# Patient Record
Sex: Male | Born: 1955
Health system: Southern US, Community
[De-identification: ages and names within clinical notes are randomized; demographics above are authoritative.]

## PROBLEM LIST (undated history)

## (undated) DIAGNOSIS — I7090 Unspecified atherosclerosis: Secondary | ICD-10-CM

## (undated) DIAGNOSIS — C189 Malignant neoplasm of colon, unspecified: Secondary | ICD-10-CM

## (undated) DIAGNOSIS — D509 Iron deficiency anemia, unspecified: Secondary | ICD-10-CM

## (undated) DIAGNOSIS — I2510 Atherosclerotic heart disease of native coronary artery without angina pectoris: Secondary | ICD-10-CM

## (undated) DIAGNOSIS — I739 Peripheral vascular disease, unspecified: Secondary | ICD-10-CM

## (undated) DIAGNOSIS — Z7902 Long term (current) use of antithrombotics/antiplatelets: Secondary | ICD-10-CM

## (undated) DIAGNOSIS — I255 Ischemic cardiomyopathy: Secondary | ICD-10-CM

## (undated) DIAGNOSIS — I517 Cardiomegaly: Secondary | ICD-10-CM

## (undated) DIAGNOSIS — C801 Malignant (primary) neoplasm, unspecified: Secondary | ICD-10-CM

## (undated) DIAGNOSIS — N135 Crossing vessel and stricture of ureter without hydronephrosis: Secondary | ICD-10-CM

## (undated) DIAGNOSIS — R918 Other nonspecific abnormal finding of lung field: Secondary | ICD-10-CM

## (undated) DIAGNOSIS — I700 Atherosclerosis of aorta: Secondary | ICD-10-CM

## (undated) DIAGNOSIS — R319 Hematuria, unspecified: Secondary | ICD-10-CM

## (undated) DIAGNOSIS — I5020 Unspecified systolic (congestive) heart failure: Secondary | ICD-10-CM

## (undated) DIAGNOSIS — E119 Type 2 diabetes mellitus without complications: Secondary | ICD-10-CM

## (undated) DIAGNOSIS — N200 Calculus of kidney: Secondary | ICD-10-CM

## (undated) DIAGNOSIS — R3129 Other microscopic hematuria: Secondary | ICD-10-CM

## (undated) DIAGNOSIS — I219 Acute myocardial infarction, unspecified: Secondary | ICD-10-CM

## (undated) DIAGNOSIS — I509 Heart failure, unspecified: Secondary | ICD-10-CM

## (undated) DIAGNOSIS — K435 Parastomal hernia without obstruction or  gangrene: Secondary | ICD-10-CM

## (undated) DIAGNOSIS — R0600 Dyspnea, unspecified: Secondary | ICD-10-CM

## (undated) DIAGNOSIS — N189 Chronic kidney disease, unspecified: Secondary | ICD-10-CM

## (undated) DIAGNOSIS — Z7982 Long term (current) use of aspirin: Secondary | ICD-10-CM

## (undated) DIAGNOSIS — E785 Hyperlipidemia, unspecified: Secondary | ICD-10-CM

## (undated) DIAGNOSIS — M1990 Unspecified osteoarthritis, unspecified site: Secondary | ICD-10-CM

## (undated) DIAGNOSIS — Z972 Presence of dental prosthetic device (complete) (partial): Secondary | ICD-10-CM

## (undated) DIAGNOSIS — N183 Chronic kidney disease, stage 3 unspecified: Secondary | ICD-10-CM

## (undated) DIAGNOSIS — K219 Gastro-esophageal reflux disease without esophagitis: Secondary | ICD-10-CM

## (undated) DIAGNOSIS — M109 Gout, unspecified: Secondary | ICD-10-CM

## (undated) DIAGNOSIS — C20 Malignant neoplasm of rectum: Secondary | ICD-10-CM

## (undated) DIAGNOSIS — I10 Essential (primary) hypertension: Secondary | ICD-10-CM

## (undated) HISTORY — DX: Iron deficiency anemia, unspecified: D50.9

## (undated) HISTORY — DX: Acute myocardial infarction, unspecified: I21.9

## (undated) HISTORY — DX: Malignant (primary) neoplasm, unspecified: C80.1

## (undated) HISTORY — PX: OTHER SURGICAL HISTORY: SHX169

## (undated) HISTORY — DX: Other nonspecific abnormal finding of lung field: R91.8

## (undated) HISTORY — DX: Hyperlipidemia, unspecified: E78.5

## (undated) HISTORY — PX: COLON SURGERY: SHX602

## (undated) HISTORY — DX: Malignant neoplasm of rectum: C20

## (undated) HISTORY — DX: Crossing vessel and stricture of ureter without hydronephrosis: N13.5

## (undated) HISTORY — DX: Hematuria, unspecified: R31.9

## (undated) HISTORY — DX: Heart failure, unspecified: I50.9

## (undated) HISTORY — DX: Essential (primary) hypertension: I10

## (undated) HISTORY — PX: COLONOSCOPY WITH ESOPHAGOGASTRODUODENOSCOPY (EGD): SHX5779

## (undated) HISTORY — DX: Gout, unspecified: M10.9

## (undated) HISTORY — DX: Malignant neoplasm of colon, unspecified: C18.9

## (undated) SURGERY — Surgical Case
Anesthesia: *Unknown

---

## 2003-04-20 DIAGNOSIS — I219 Acute myocardial infarction, unspecified: Secondary | ICD-10-CM

## 2003-04-20 HISTORY — DX: Acute myocardial infarction, unspecified: I21.9

## 2004-03-19 HISTORY — PX: CORONARY ANGIOPLASTY WITH STENT PLACEMENT: SHX49

## 2004-04-14 ENCOUNTER — Other Ambulatory Visit: Payer: Self-pay

## 2004-04-14 ENCOUNTER — Inpatient Hospital Stay: Payer: Self-pay

## 2004-04-14 DIAGNOSIS — I214 Non-ST elevation (NSTEMI) myocardial infarction: Secondary | ICD-10-CM

## 2004-04-14 HISTORY — DX: Non-ST elevation (NSTEMI) myocardial infarction: I21.4

## 2004-04-15 ENCOUNTER — Other Ambulatory Visit: Payer: Self-pay

## 2004-04-16 ENCOUNTER — Other Ambulatory Visit: Payer: Self-pay

## 2004-04-16 HISTORY — PX: CORONARY ANGIOPLASTY WITH STENT PLACEMENT: SHX49

## 2004-05-18 ENCOUNTER — Ambulatory Visit: Payer: Self-pay | Admitting: *Deleted

## 2004-05-26 ENCOUNTER — Encounter: Payer: Self-pay | Admitting: Internal Medicine

## 2004-11-04 ENCOUNTER — Emergency Department: Payer: Self-pay | Admitting: Emergency Medicine

## 2008-06-20 ENCOUNTER — Ambulatory Visit: Payer: Self-pay | Admitting: Urology

## 2008-08-30 ENCOUNTER — Emergency Department: Payer: Self-pay | Admitting: Emergency Medicine

## 2008-09-18 ENCOUNTER — Ambulatory Visit: Payer: Self-pay | Admitting: Urology

## 2008-09-25 ENCOUNTER — Emergency Department: Payer: Self-pay | Admitting: Emergency Medicine

## 2013-09-28 DIAGNOSIS — M109 Gout, unspecified: Secondary | ICD-10-CM | POA: Insufficient documentation

## 2013-09-28 DIAGNOSIS — I10 Essential (primary) hypertension: Secondary | ICD-10-CM | POA: Insufficient documentation

## 2013-09-28 DIAGNOSIS — I2510 Atherosclerotic heart disease of native coronary artery without angina pectoris: Secondary | ICD-10-CM | POA: Insufficient documentation

## 2013-09-28 DIAGNOSIS — E785 Hyperlipidemia, unspecified: Secondary | ICD-10-CM | POA: Insufficient documentation

## 2013-09-28 DIAGNOSIS — E119 Type 2 diabetes mellitus without complications: Secondary | ICD-10-CM | POA: Insufficient documentation

## 2013-10-01 DIAGNOSIS — R0602 Shortness of breath: Secondary | ICD-10-CM | POA: Insufficient documentation

## 2014-01-23 ENCOUNTER — Ambulatory Visit: Payer: Self-pay | Admitting: Internal Medicine

## 2014-01-23 LAB — CBC CANCER CENTER
BASOS ABS: 0 x10 3/mm (ref 0.0–0.1)
Basophil %: 0.8 %
Eosinophil #: 0.2 x10 3/mm (ref 0.0–0.7)
Eosinophil %: 3.3 %
HCT: 28.3 % — AB (ref 40.0–52.0)
HGB: 8.4 g/dL — ABNORMAL LOW (ref 13.0–18.0)
Lymphocyte #: 1.3 x10 3/mm (ref 1.0–3.6)
Lymphocyte %: 19.7 %
MCH: 19.5 pg — ABNORMAL LOW (ref 26.0–34.0)
MCHC: 29.5 g/dL — ABNORMAL LOW (ref 32.0–36.0)
MCV: 66 fL — ABNORMAL LOW (ref 80–100)
MONO ABS: 0.5 x10 3/mm (ref 0.2–1.0)
Monocyte %: 7.9 %
Neutrophil #: 4.4 x10 3/mm (ref 1.4–6.5)
Neutrophil %: 68.3 %
PLATELETS: 339 x10 3/mm (ref 150–440)
RBC: 4.28 10*6/uL — AB (ref 4.40–5.90)
RDW: 17.7 % — AB (ref 11.5–14.5)
WBC: 6.4 x10 3/mm (ref 3.8–10.6)

## 2014-01-23 LAB — CREATININE, SERUM
CREATININE: 1.88 mg/dL — AB (ref 0.60–1.30)
EGFR (African American): 47 — ABNORMAL LOW
EGFR (Non-African Amer.): 39 — ABNORMAL LOW

## 2014-01-24 LAB — URIC ACID: URIC ACID: 7.9 mg/dL — AB (ref 3.5–7.2)

## 2014-01-25 LAB — KAPPA/LAMBDA FREE LIGHT CHAINS (ARMC)

## 2014-01-25 LAB — PROT IMMUNOELECTROPHORES(ARMC)

## 2014-02-01 LAB — CREATININE, SERUM
Creatinine: 1.97 mg/dL — ABNORMAL HIGH (ref 0.60–1.30)
EGFR (African American): 45 — ABNORMAL LOW
EGFR (Non-African Amer.): 37 — ABNORMAL LOW

## 2014-02-01 LAB — URINALYSIS, COMPLETE
BACTERIA: NONE SEEN
Bilirubin,UR: NEGATIVE
Blood: NEGATIVE
Glucose,UR: NEGATIVE mg/dL (ref 0–75)
Hyaline Cast: 14
Ketone: NEGATIVE
Nitrite: NEGATIVE
Ph: 5 (ref 4.5–8.0)
Protein: NEGATIVE
SPECIFIC GRAVITY: 1.013 (ref 1.003–1.030)
WBC UR: 1 /HPF (ref 0–5)

## 2014-02-01 LAB — CANCER CENTER HEMOGLOBIN: HGB: 8.6 g/dL — ABNORMAL LOW (ref 13.0–18.0)

## 2014-02-17 ENCOUNTER — Ambulatory Visit: Payer: Self-pay | Admitting: Internal Medicine

## 2014-02-22 LAB — CANCER CENTER HEMOGLOBIN: HGB: 9.5 g/dL — ABNORMAL LOW (ref 13.0–18.0)

## 2014-03-01 LAB — CANCER CENTER HEMOGLOBIN: HGB: 9.4 g/dL — ABNORMAL LOW (ref 13.0–18.0)

## 2014-03-03 LAB — KAPPA/LAMBDA FREE LIGHT CHAINS (ARMC)

## 2014-03-06 LAB — IRON AND TIBC
IRON BIND. CAP.(TOTAL): 304 ug/dL (ref 250–450)
IRON SATURATION: 45 %
IRON: 136 ug/dL (ref 65–175)
Unbound Iron-Bind.Cap.: 168 ug/dL

## 2014-03-06 LAB — FERRITIN: Ferritin (ARMC): 12 ng/mL (ref 8–388)

## 2014-03-06 LAB — CANCER CENTER HEMOGLOBIN: HGB: 9.8 g/dL — AB (ref 13.0–18.0)

## 2014-03-19 ENCOUNTER — Ambulatory Visit: Payer: Self-pay | Admitting: Internal Medicine

## 2014-04-05 LAB — CANCER CENTER HEMOGLOBIN: HGB: 9.7 g/dL — ABNORMAL LOW (ref 13.0–18.0)

## 2014-04-19 ENCOUNTER — Ambulatory Visit: Payer: Self-pay | Admitting: Internal Medicine

## 2014-05-01 LAB — CANCER CENTER HEMOGLOBIN: HGB: 11.1 g/dL — ABNORMAL LOW (ref 13.0–18.0)

## 2014-05-01 LAB — FERRITIN: FERRITIN (ARMC): 13 ng/mL (ref 8–388)

## 2014-05-08 LAB — CANCER CENTER HEMOGLOBIN: HGB: 11.1 g/dL — AB (ref 13.0–18.0)

## 2014-05-08 LAB — FERRITIN: Ferritin (ARMC): 11 ng/mL (ref 8–388)

## 2014-05-16 ENCOUNTER — Ambulatory Visit: Payer: Self-pay | Admitting: Internal Medicine

## 2014-05-20 ENCOUNTER — Ambulatory Visit: Payer: Self-pay | Admitting: Internal Medicine

## 2014-06-06 ENCOUNTER — Ambulatory Visit: Payer: Self-pay | Admitting: Gastroenterology

## 2014-06-17 HISTORY — PX: COLONOSCOPY: SHX174

## 2014-06-18 ENCOUNTER — Ambulatory Visit
Admit: 2014-06-18 | Disposition: A | Discharge status: Home / Self Care | Payer: Self-pay | Attending: Internal Medicine | Admitting: Internal Medicine

## 2014-07-01 ENCOUNTER — Ambulatory Visit: Payer: Self-pay | Admitting: Surgery

## 2014-07-01 HISTORY — PX: PORTACATH PLACEMENT: SHX2246

## 2014-07-15 LAB — CBC CANCER CENTER
Basophil #: 0 x10 3/mm (ref 0.0–0.1)
Basophil %: 0.4 %
EOS PCT: 2.6 %
Eosinophil #: 0.1 x10 3/mm (ref 0.0–0.7)
HCT: 37.7 % — ABNORMAL LOW (ref 40.0–52.0)
HGB: 11.9 g/dL — ABNORMAL LOW (ref 13.0–18.0)
LYMPHS ABS: 0.5 x10 3/mm — AB (ref 1.0–3.6)
Lymphocyte %: 10.1 %
MCH: 22.3 pg — AB (ref 26.0–34.0)
MCHC: 31.4 g/dL — ABNORMAL LOW (ref 32.0–36.0)
MCV: 71 fL — AB (ref 80–100)
MONO ABS: 0.3 x10 3/mm (ref 0.2–1.0)
Monocyte %: 6.4 %
Neutrophil #: 3.7 x10 3/mm (ref 1.4–6.5)
Neutrophil %: 80.5 %
Platelet: 179 x10 3/mm (ref 150–440)
RBC: 5.31 10*6/uL (ref 4.40–5.90)
RDW: 17.2 % — AB (ref 11.5–14.5)
WBC: 4.5 x10 3/mm (ref 3.8–10.6)

## 2014-07-15 LAB — COMPREHENSIVE METABOLIC PANEL
ANION GAP: 5 — AB (ref 7–16)
Albumin: 4.1 g/dL
Alkaline Phosphatase: 75 U/L
BUN: 39 mg/dL — ABNORMAL HIGH
Bilirubin,Total: 0.6 mg/dL
CALCIUM: 9 mg/dL
Chloride: 100 mmol/L — ABNORMAL LOW
Co2: 27 mmol/L
Creatinine: 1.37 mg/dL — ABNORMAL HIGH
EGFR (Non-African Amer.): 56 — ABNORMAL LOW
Glucose: 97 mg/dL
Potassium: 3.8 mmol/L
SGOT(AST): 14 U/L — ABNORMAL LOW
SGPT (ALT): 12 U/L — ABNORMAL LOW
Sodium: 132 mmol/L — ABNORMAL LOW
TOTAL PROTEIN: 6.7 g/dL

## 2014-07-17 LAB — CBC CANCER CENTER
Basophil #: 0 x10 3/mm (ref 0.0–0.1)
Basophil %: 0.2 %
Eosinophil #: 0.1 x10 3/mm (ref 0.0–0.7)
Eosinophil %: 1.6 %
HCT: 38.1 % — ABNORMAL LOW (ref 40.0–52.0)
HGB: 12 g/dL — ABNORMAL LOW (ref 13.0–18.0)
LYMPHS ABS: 0.4 x10 3/mm — AB (ref 1.0–3.6)
Lymphocyte %: 5.3 %
MCH: 22.4 pg — AB (ref 26.0–34.0)
MCHC: 31.4 g/dL — ABNORMAL LOW (ref 32.0–36.0)
MCV: 71 fL — ABNORMAL LOW (ref 80–100)
MONO ABS: 0.4 x10 3/mm (ref 0.2–1.0)
MONOS PCT: 6.2 %
NEUTROS ABS: 6 x10 3/mm (ref 1.4–6.5)
Neutrophil %: 86.7 %
PLATELETS: 192 x10 3/mm (ref 150–440)
RBC: 5.34 10*6/uL (ref 4.40–5.90)
RDW: 17.7 % — AB (ref 11.5–14.5)
WBC: 6.9 x10 3/mm (ref 3.8–10.6)

## 2014-07-19 ENCOUNTER — Ambulatory Visit
Admit: 2014-07-19 | Disposition: A | Discharge status: Home / Self Care | Payer: Self-pay | Attending: Internal Medicine | Admitting: Internal Medicine

## 2014-07-22 LAB — HEPATIC FUNCTION PANEL A (ARMC)
Albumin: 4 g/dL
Alkaline Phosphatase: 73 U/L
BILIRUBIN TOTAL: 0.8 mg/dL
SGOT(AST): 13 U/L — ABNORMAL LOW
SGPT (ALT): 13 U/L — ABNORMAL LOW
TOTAL PROTEIN: 7 g/dL

## 2014-07-22 LAB — CBC CANCER CENTER
Basophil #: 0 x10 3/mm (ref 0.0–0.1)
Basophil %: 0.3 %
EOS PCT: 2 %
Eosinophil #: 0.1 x10 3/mm (ref 0.0–0.7)
HCT: 36 % — ABNORMAL LOW (ref 40.0–52.0)
HGB: 11.2 g/dL — ABNORMAL LOW (ref 13.0–18.0)
Lymphocyte #: 0.2 x10 3/mm — ABNORMAL LOW (ref 1.0–3.6)
Lymphocyte %: 2.6 %
MCH: 22 pg — ABNORMAL LOW (ref 26.0–34.0)
MCHC: 31 g/dL — AB (ref 32.0–36.0)
MCV: 71 fL — ABNORMAL LOW (ref 80–100)
MONOS PCT: 5.5 %
Monocyte #: 0.4 x10 3/mm (ref 0.2–1.0)
NEUTROS ABS: 5.8 x10 3/mm (ref 1.4–6.5)
NEUTROS PCT: 89.6 %
PLATELETS: 189 x10 3/mm (ref 150–440)
RBC: 5.08 10*6/uL (ref 4.40–5.90)
RDW: 17.5 % — ABNORMAL HIGH (ref 11.5–14.5)
WBC: 6.5 x10 3/mm (ref 3.8–10.6)

## 2014-07-22 LAB — CREATININE, SERUM
CREATININE: 1.45 mg/dL — AB
Creatine, Serum: 1.45
GFR CALC NON AF AMER: 52 — AB

## 2014-07-29 LAB — HEPATIC FUNCTION PANEL A (ARMC)
ALK PHOS: 76 U/L
Albumin: 3.7 g/dL
BILIRUBIN TOTAL: 0.5 mg/dL
SGOT(AST): 17 U/L
SGPT (ALT): 22 U/L
Total Protein: 7.3 g/dL

## 2014-07-29 LAB — CBC CANCER CENTER
BASOS ABS: 0 x10 3/mm (ref 0.0–0.1)
Basophil %: 0.4 %
EOS ABS: 0.3 x10 3/mm (ref 0.0–0.7)
Eosinophil %: 3.8 %
HCT: 35.8 % — ABNORMAL LOW (ref 40.0–52.0)
HGB: 11.1 g/dL — ABNORMAL LOW (ref 13.0–18.0)
LYMPHS PCT: 4 %
Lymphocyte #: 0.3 x10 3/mm — ABNORMAL LOW (ref 1.0–3.6)
MCH: 22.2 pg — ABNORMAL LOW (ref 26.0–34.0)
MCHC: 30.9 g/dL — AB (ref 32.0–36.0)
MCV: 72 fL — ABNORMAL LOW (ref 80–100)
Monocyte #: 0.6 x10 3/mm (ref 0.2–1.0)
Monocyte %: 7.2 %
NEUTROS PCT: 84.6 %
Neutrophil #: 6.9 x10 3/mm — ABNORMAL HIGH (ref 1.4–6.5)
PLATELETS: 346 x10 3/mm (ref 150–440)
RBC: 5 10*6/uL (ref 4.40–5.90)
RDW: 18.3 % — AB (ref 11.5–14.5)
WBC: 8.2 x10 3/mm (ref 3.8–10.6)

## 2014-07-29 LAB — CREATININE, SERUM
Creatinine: 1.7 mg/dL — ABNORMAL HIGH
GFR CALC AF AMER: 50 — AB
GFR CALC NON AF AMER: 43 — AB

## 2014-07-31 LAB — CREATININE, SERUM
CREATININE: 1.72 mg/dL — AB
GFR CALC AF AMER: 49 — AB
GFR CALC NON AF AMER: 42 — AB

## 2014-08-05 LAB — CBC CANCER CENTER
BASOS ABS: 0 x10 3/mm (ref 0.0–0.1)
Basophil %: 0.4 %
Eosinophil #: 0.2 x10 3/mm (ref 0.0–0.7)
Eosinophil %: 2 %
HCT: 36.6 % — ABNORMAL LOW (ref 40.0–52.0)
HGB: 11.5 g/dL — ABNORMAL LOW (ref 13.0–18.0)
LYMPHS PCT: 2.7 %
Lymphocyte #: 0.3 x10 3/mm — ABNORMAL LOW (ref 1.0–3.6)
MCH: 22.5 pg — ABNORMAL LOW (ref 26.0–34.0)
MCHC: 31.4 g/dL — ABNORMAL LOW (ref 32.0–36.0)
MCV: 72 fL — ABNORMAL LOW (ref 80–100)
MONOS PCT: 4.8 %
Monocyte #: 0.5 x10 3/mm (ref 0.2–1.0)
Neutrophil #: 8.9 x10 3/mm — ABNORMAL HIGH (ref 1.4–6.5)
Neutrophil %: 90.1 %
Platelet: 458 x10 3/mm — ABNORMAL HIGH (ref 150–440)
RBC: 5.1 10*6/uL (ref 4.40–5.90)
RDW: 18.2 % — AB (ref 11.5–14.5)
WBC: 9.9 x10 3/mm (ref 3.8–10.6)

## 2014-08-05 LAB — HEPATIC FUNCTION PANEL A (ARMC)
Albumin: 3.9 g/dL
Alkaline Phosphatase: 75 U/L
Bilirubin,Total: 0.5 mg/dL
SGOT(AST): 16 U/L
SGPT (ALT): 16 U/L — ABNORMAL LOW
Total Protein: 7.1 g/dL

## 2014-08-05 LAB — CREATININE, SERUM
Creatinine: 1.64 mg/dL — ABNORMAL HIGH
EGFR (Non-African Amer.): 45 — ABNORMAL LOW
GFR CALC AF AMER: 52 — AB

## 2014-08-06 LAB — URIC ACID: URIC ACID: 9.2 mg/dL — AB

## 2014-08-12 LAB — CBC CANCER CENTER
BASOS PCT: 0.3 %
Basophil #: 0 x10 3/mm (ref 0.0–0.1)
EOS PCT: 3.5 %
Eosinophil #: 0.2 x10 3/mm (ref 0.0–0.7)
HCT: 37.1 % — AB (ref 40.0–52.0)
HGB: 11.7 g/dL — AB (ref 13.0–18.0)
LYMPHS ABS: 0.3 x10 3/mm — AB (ref 1.0–3.6)
Lymphocyte %: 4.2 %
MCH: 22.8 pg — ABNORMAL LOW (ref 26.0–34.0)
MCHC: 31.6 g/dL — AB (ref 32.0–36.0)
MCV: 72 fL — ABNORMAL LOW (ref 80–100)
MONO ABS: 0.7 x10 3/mm (ref 0.2–1.0)
Monocyte %: 10.2 %
Neutrophil #: 5.2 x10 3/mm (ref 1.4–6.5)
Neutrophil %: 81.8 %
Platelet: 282 x10 3/mm (ref 150–440)
RBC: 5.14 10*6/uL (ref 4.40–5.90)
RDW: 20.6 % — ABNORMAL HIGH (ref 11.5–14.5)
WBC: 6.4 x10 3/mm (ref 3.8–10.6)

## 2014-08-12 LAB — COMPREHENSIVE METABOLIC PANEL
ALT: 15 U/L — AB
Albumin: 3.7 g/dL
Alkaline Phosphatase: 69 U/L
Anion Gap: 8 (ref 7–16)
BUN: 30 mg/dL — ABNORMAL HIGH
Bilirubin,Total: 0.6 mg/dL
CALCIUM: 8.9 mg/dL
Chloride: 100 mmol/L — ABNORMAL LOW
Co2: 24 mmol/L
Creatinine: 1.52 mg/dL — ABNORMAL HIGH
GFR CALC AF AMER: 57 — AB
GFR CALC NON AF AMER: 49 — AB
Glucose: 118 mg/dL — ABNORMAL HIGH
POTASSIUM: 4 mmol/L
SGOT(AST): 16 U/L
Sodium: 132 mmol/L — ABNORMAL LOW
Total Protein: 7.1 g/dL

## 2014-08-12 LAB — SURGICAL PATHOLOGY

## 2014-08-18 NOTE — Consult Note (Signed)
Reason for Visit: This 59 year old Male patient presents to the clinic for initial evaluation of  rectal cancer .   Referred by Dr.Gitten.  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 CT scan showed small into determine and pulmonary nodules which will warrant follow-up. There was a large rectal mass suspicious for transmural extension into the mesorectum. Was also equivocal perirectal and sigmoid mesocolon nodes which no metastasis cannot be excluded. No liver metastasis or extrapelvic disease was identified. Patient underwent endoscopic ultrasound at [facility] showing a mass 9-14 cm from the anal verge 70% circumferential measuring 9 mm in maximal thickness with sonographic evidence suggesting breakthrough of the muscularis propria with invasion into the perirectal fat and lymph node was visualized not thought to be representing metastatic adenopathy. He has been evaluated by medical oncology and is seen today for consideration of neoadjuvant chemoradiation prior to surgical resection. He continues to have some bright red blood per rectum although has been constipated with only gas production over the past several days. Is having no rectal pain significantly on defecation.  Past Hx:    Gout:    HTN:    Myocardial Infarction - Dec 2005:    Ureteral stricture repair:    Cardiac Stents - Dec 2005:   Past, Family and Social History:  Past Medical History  positive   Cardiovascular coronary artery stents; hyperlipidemia; hypertension; myocardial infarction; MI in 2005   Genitourinary ureteral stricture repair   Past Medical History Comments gout   Family History noncontributory   Social History noncontributory   Additional Past Medical and Surgical History seen by himself today   Allergies:   No Known Allergies:   Home Meds:  Home Medications: Medication Instructions Status  clonidine 0.2 mg oral tablet 1 tab(s) orally 2 times a day  Active  Iron 65 1 tab(s) orally 2 times a day Active  hydrochlorothiazide-lisinopril 25 mg-20 mg oral tablet 1 tab(s) orally once a day Active   Review of Systems:  General negative   Performance Status (ECOG) 0   Skin negative   Breast negative   Ophthalmologic negative   ENMT negative   Respiratory and Thorax negative   Cardiovascular negative   Gastrointestinal see HPI   Genitourinary negative   Musculoskeletal negative   Neurological negative   Psychiatric negative   Hematology/Lymphatics negative   Endocrine negative   Allergic/Immunologic negative   Review of Systems   except for bright red blood per rectumdenies any weight loss, fatigue, weakness, fever, chills or night sweats. Patient denies any loss of vision, blurred vision. Patient denies any ringing  of the ears or hearing loss. No irregular heartbeat. Patient denies heart murmur or history of fainting. Patient denies any chest pain or pain radiating to her upper extremities. Patient denies any shortness of breath, difficulty breathing at night, cough or hemoptysis. Patient denies any swelling in the lower legs. Patient denies any nausea vomiting, vomiting of blood, or coffee ground material in the vomitus. Patient denies any stomach pain. Patient states has  had normal bowel movements no significant constipation or diarrhea. Patient denies any dysuria, hematuria or significant nocturia. Patient denies any problems  walking, swelling in the joints or loss of balance. Patient denies any skin changes, loss of hair or loss of weight. Patient denies any excessive worrying or anxiety or significant depression. Patient denies any problems with insomnia. Patient denies excessive thirst, polyuria, polydipsia. Patient denies any swollen glands, patient denies easy bruising or easy bleeding. Patient denies any recent infections, allergies or URI. Patient "s visual fields have not changed significantly in recent time.   Nursing Notes:  Nursing Vital Signs and Chemo Nursing Nursing Notes: *CC Vital Signs Flowsheet:   02-Mar-16 10:38  Temp Temperature 96.8  Pulse Pulse 57  Respirations Respirations 18  SBP SBP 111  DBP DBP 69  Pain Scale (0-10)  0  Current Weight (kg) (kg) 92.9  Height (cm) centimeters 177  BSA (m2) 2.1   Physical Exam:  General/Skin/HEENT:  General normal   Skin normal   Eyes normal   ENMT normal   Head and Neck normal   Additional PE well-developed male in NAD. Lungs are clear to A&P cardiac examination shows regular rate and rhythm. No cervical or supraclavicular adenopathy is appreciated. Abdomen is benign positive bowel sounds in all 4 quadrants. On rectal exam rectal sphincter tone is good. At the tip of my examination finger is some firmness consistent with known distal rectal adenocarcinoma.   Breasts/Resp/CV/GI/GU:  Respiratory and Thorax normal   Cardiovascular normal   Gastrointestinal normal   Genitourinary normal   MS/Neuro/Psych/Lymph:  Musculoskeletal normal   Neurological normal   Lymphatics normal   Other Results:  Radiology Results: CT:    26-Feb-16 10:23, CT Chest, Abd, and Pelvis With Contrast  CT Chest, Abd, and Pelvis With Contrast   REASON FOR EXAM:    rectal cancer staging  COMMENTS:       PROCEDURE: CT  - CT CHEST ABDOMEN AND PELVIS W  - Jun 14 2014 10:23AM     CLINICAL DATA:  Anemia. Bleeding. Rectal cancer staging.  Weight  loss.    EXAM:  CT CHEST, ABDOMEN, AND PELVIS WITH CONTRAST    TECHNIQUE:  Multidetector CT imaging of the chest, abdomen and pelvis was  performed following the standard protocol during bolus  administration of intravenous contrast.  CONTRAST:  100 cc Omnipaque 300    COMPARISON:  None.    FINDINGS:  CT CHEST FINDINGS    Mediastinum/Nodes: No supraclavicular adenopathy. Tortuous  descending thoracic aorta. borderline cardiomegaly, without  pericardial effusion. Multivessel coronary artery atherosclerosis.  No central pulmonary embolism, on this non-dedicated study. No  mediastinal or hilar adenopathy.    Lungs/Pleura: No pleural fluid.  2 mm lingular nodule on image 29.    3 mm left lower lobe pulmonary nodule on image 38.    Musculoskeletal: No acute osseous abnormality.    CT ABDOMEN PELVIS FINDINGS    Hepatobiliary: Normal liver. Normal gallbladder, without biliary  ductal dilatation.    Pancreas: Normal, without mass or ductal dilatation.    Spleen: Normal  Adrenals/Urinary Tract: Mild right adrenal thickening. Mild left  adrenal nodularity, without well-defined dominant lesion. Mild renal  cortical thinning bilaterally. Interpolar 9 mm left renal cyst. Too  small to characterize lesions in the right kidney, most likely  cysts. Normal ureters and urinary bladder.    Stomach/Bowel: Normal stomach, without wall thickening.    Moderate length irregular rectal wall thickening, including on  images 99 through 106  of series 2. Transmural disease into the  mesorectum is strongly suspected at the 7 o'clock position on image  106 of series 2. More equivocal transmural spread at the 5 o'clock  position on image 102 of series 2. No obstruction.    Normal terminal ileum and appendix.  Normal small bowel.  Vascular/Lymphatic: Aortic and branch vessel atherosclerosis. Right  external iliac stent.    No retroperitoneal or retrocrural adenopathy. No pelvic  sidewall  adenopathy.    Nodes in the mesorectum measure maximally 5 mm on image 100.  Borderline enlarged. A sigmoid mesocolon node measures 7 mm on image  92 and is suspicious. Smaller nodes in the more cephalad mesocolon  are indeterminate, including on image 88.    Reproductive: Mild prostatomegaly.    Other: No significant free fluid. No evidence of omental or  peritoneal disease.  Musculoskeletal: Transitional left-sided lumbosacral anatomy. Lower  lumbar degenerative disc disease.     IMPRESSION:  CT CHEST IMPRESSION    1. Small indeterminate pulmonary nodules, which warrant followup  attention.  2. Otherwise, no evidence of metastatic disease within the chest.  3.  Atherosclerosis, including within the coronary arteries.    CT ABDOMEN AND PELVIS IMPRESSION    1. Rectal primary. Suspicion of transmural extension into the  mesorectum.  2. Equivocal perirectal and sigmoid mesocolon nodes. Nodal  metastasis cannot be excluded.  3. No extrapelvic metastatic disease identified.      Electronically Signed    By: Abigail Miyamoto M.D.    On: 06/14/2014 11:18         Verified By: Areta Haber, M.D.,   Relevent Results:   Relevant Scans and Labs CT scan of the chest is reviewed as well as endoscopic ultrasound   Assessment and Plan: Impression:   at least stage II a adenocarcinoma distal rectum in 59 year old male. Plan:   at this time I discussed the case personally with medical oncology. Favor neoadjuvant chemoradiation prior to surgical resection. Patient will most probably need an AP resection based on the low-lying tumor. I will plan on delivering 4500 cGy to his whole pelvis boosting his rectum another thousand centigray if I can exclude all small bowel from her treatment field. I have set up and ordered CT simulation early next week. Discussion now is whether there is malignant adenopathy in the pelvis and we will discuss his case at weekly tumor conference.  Possibility of PET/CT to be decided at that time. Risks and benefits of treatment including increased diarrhea, lower urinary tract symptoms, alteration of blood counts, fatigue, skin reaction, all were discussed in detail with the patient. He seems to comprehend my treatment plan well.  I would like to take this opportunity for allowing me to participate in the care of your patient..  Electronic Signatures: Shanielle Correll, Roda Shutters (MD)  (Signed 02-Mar-16 11:34)  Authored: HPI, Diagnosis, Past Hx, PFSH, Allergies, Home Meds, ROS, Nursing Notes, Physical Exam, Other Results, Relevent Results, Encounter Assessment and Plan   Last Updated: 02-Mar-16 11:34 by Armstead Peaks (MD)

## 2014-08-18 NOTE — Op Note (Signed)
PATIENT NAME:  [name] [name], [name] [name] MR#:  [id number] DATE OF BIRTH:  Aug 25, 1955  DATE OF PROCEDURE:  07/01/2014  PREOPERATIVE DIAGNOSIS: Rectal carcinoma.   POSTOPERATIVE DIAGNOSIS: Rectal carcinoma.  PROCEDURE PERFORMED: Right internal jugular vein Port-A-Cath insertion.   SURGEON: Gabrielle Mester A. Marina Gravel, MD   ASSISTANT: Scrub tech  ANESTHESIA: Local with heavy intravenous sedation.   FINDINGS: Tip of catheter within the SVC, right atrial junction. Small internal jugular vein.   DESCRIPTION OF PROCEDURE: With informed consent in supine position, the patient's right neck and upper chest were sterilely clipped of hairs, prepped and draped with ChloraPrep solution and Ioban. Timeout was observed.  Local anesthetic was infiltrated over the course of the right jugular vein in the noth of the sternocledomastoid muscle.  Utilizing the handheld ultrasound probe, the right internal jugular vein was found. It was found to be diminutive. The patient was then given approximately 1 liter of normal saline. After multiple attempts with the 22 Gauge finder needle the vein was cannulated. Furthermore, during this attempt, several attempts at cannulation of the right subclavian vein was also attempted; however, blood could be returned, but the wire could not pass. Once the internal jugular vein was cannulated and wire was passed, fluoroscopy demonstrated it to be in the proper right sided chambers of the heart.   Local anesthesia was infiltrated on the anterior chest wall and the intended area of the pocket for the Port-A-Cath. Transverse skin incision was fashioned and deepened to the clavipectoral fascia with electrocautery and blunt technique. A small skin incision was fashioned over the wire. Peel-away introducer with dilator was placed after the catheter was tunneled between these 2 sites. The catheter was then inserted through the peel-away introducer; the peel-away introducer was removed. Utilizing dynamic  fluoroscopy, the catheter was then shortened to the appropriate length, assembled to the reservoir. The reservoir was then flushed and aspirated easily with saline, followed by 1000 units/mL heparinized saline. The Port-A-Cath was then secured to the clavipectoral fascia at 2 points with 3-0 PDS. Final inspection of the operative site with fluoroscopy demonstrated no evidence of kinking.   The deep pocket of the layer was closed utilizing a running 3-0 Vicryl suture; 4-0 Vicryl subcuticular in skin; followed by Dermabond in both sites and sterile occlusive dressing. The patient was subsequently extubated and taken to the recovery room in stable and satisfactory condition by anesthesia services.   ____________________________ Jeannette How Marina Gravel, MD mab:LT D: 07/01/2014 15:21:45 ET T: 07/01/2014 17:14:24 ET JOB#: [id number]  cc: Elta Guadeloupe A. Marina Gravel, MD, <Dictator> Hortencia Conradi MD ELECTRONICALLY SIGNED 07/01/2014 19:19

## 2014-08-22 ENCOUNTER — Encounter: Payer: Self-pay | Admitting: *Deleted

## 2014-08-22 DIAGNOSIS — C20 Malignant neoplasm of rectum: Secondary | ICD-10-CM | POA: Insufficient documentation

## 2014-08-23 ENCOUNTER — Other Ambulatory Visit: Payer: Self-pay | Admitting: *Deleted

## 2014-08-23 DIAGNOSIS — C20 Malignant neoplasm of rectum: Secondary | ICD-10-CM

## 2014-08-26 ENCOUNTER — Inpatient Hospital Stay (HOSPITAL_BASED_OUTPATIENT_CLINIC_OR_DEPARTMENT_OTHER): Payer: Managed Care, Other (non HMO) | Admitting: Internal Medicine

## 2014-08-26 ENCOUNTER — Encounter: Payer: Self-pay | Admitting: Internal Medicine

## 2014-08-26 ENCOUNTER — Inpatient Hospital Stay: Payer: Managed Care, Other (non HMO) | Attending: Internal Medicine | Admitting: Internal Medicine

## 2014-08-26 VITALS — BP 98/64 | HR 72 | Temp 98.7°F | Resp 18 | Ht 70.0 in | Wt 192.7 lb

## 2014-08-26 DIAGNOSIS — E119 Type 2 diabetes mellitus without complications: Secondary | ICD-10-CM

## 2014-08-26 DIAGNOSIS — C20 Malignant neoplasm of rectum: Secondary | ICD-10-CM | POA: Insufficient documentation

## 2014-08-26 DIAGNOSIS — I10 Essential (primary) hypertension: Secondary | ICD-10-CM

## 2014-08-26 DIAGNOSIS — Z79899 Other long term (current) drug therapy: Secondary | ICD-10-CM | POA: Insufficient documentation

## 2014-08-26 DIAGNOSIS — I959 Hypotension, unspecified: Secondary | ICD-10-CM

## 2014-08-26 LAB — COMPREHENSIVE METABOLIC PANEL
ALK PHOS: 69 U/L (ref 38–126)
ALT: 14 U/L — ABNORMAL LOW (ref 17–63)
AST: 11 U/L — ABNORMAL LOW (ref 15–41)
Albumin: 3.6 g/dL (ref 3.5–5.0)
Anion gap: 7 (ref 5–15)
BUN: 72 mg/dL — ABNORMAL HIGH (ref 6–20)
CO2: 22 mmol/L (ref 22–32)
Calcium: 9 mg/dL (ref 8.9–10.3)
Chloride: 103 mmol/L (ref 101–111)
Creatinine, Ser: 1.93 mg/dL — ABNORMAL HIGH (ref 0.61–1.24)
GFR calc non Af Amer: 36 mL/min — ABNORMAL LOW (ref >60)
GFR, EST AFRICAN AMERICAN: 42 mL/min — AB (ref >60)
GLUCOSE: 109 mg/dL — AB (ref 65–99)
POTASSIUM: 4.4 mmol/L (ref 3.5–5.1)
Sodium: 132 mmol/L — ABNORMAL LOW (ref 135–145)
Total Bilirubin: 0.4 mg/dL (ref 0.3–1.2)
Total Protein: 6.6 g/dL (ref 6.5–8.1)

## 2014-08-26 LAB — CBC WITH DIFFERENTIAL/PLATELET
BASOS PCT: 1 %
Basophils Absolute: 0 10*3/uL (ref 0–0.1)
EOS PCT: 3 %
Eosinophils Absolute: 0.2 10*3/uL (ref 0–0.7)
HEMATOCRIT: 35.4 % — AB (ref 40.0–52.0)
HEMOGLOBIN: 11.2 g/dL — AB (ref 13.0–18.0)
LYMPHS ABS: 0.3 10*3/uL — AB (ref 1.0–3.6)
LYMPHS PCT: 5 %
MCH: 23.5 pg — ABNORMAL LOW (ref 26.0–34.0)
MCHC: 31.6 g/dL — ABNORMAL LOW (ref 32.0–36.0)
MCV: 74.4 fL — AB (ref 80.0–100.0)
MONO ABS: 0.7 10*3/uL (ref 0.2–1.0)
Monocytes Relative: 12 %
Neutro Abs: 4.6 10*3/uL (ref 1.4–6.5)
Neutrophils Relative %: 79 %
Platelets: 346 10*3/uL (ref 150–440)
RBC: 4.76 MIL/uL (ref 4.40–5.90)
RDW: 26.3 % — ABNORMAL HIGH (ref 11.5–14.5)
WBC: 5.7 10*3/uL (ref 3.8–10.6)

## 2014-08-26 LAB — MAGNESIUM: MAGNESIUM: 2.5 mg/dL — AB (ref 1.7–2.4)

## 2014-08-26 MED ORDER — SODIUM CHLORIDE 0.9 % IJ SOLN
10.0000 mL | INTRAMUSCULAR | Status: AC | PRN
  Administered 2014-08-26: 10 mL via INTRAVENOUS
  Filled 2014-08-26: qty 10

## 2014-08-26 MED ORDER — SODIUM CHLORIDE 0.9 % IV SOLN
INTRAVENOUS | Status: DC
  Administered 2014-08-26: 13:00:00 via INTRAVENOUS
  Filled 2014-08-26: qty 250

## 2014-08-26 MED ORDER — HEPARIN SOD (PORK) LOCK FLUSH 100 UNIT/ML IV SOLN
500.0000 [IU] | Freq: Once | INTRAVENOUS | Status: AC
  Administered 2014-08-26: 500 [IU] via INTRAVENOUS

## 2014-08-26 MED ORDER — HEPARIN SOD (PORK) LOCK FLUSH 100 UNIT/ML IV SOLN
INTRAVENOUS | Status: AC
  Filled 2014-08-26: qty 5

## 2014-08-26 NOTE — Progress Notes (Signed)
[place] Note-Oncology Follow Up  5/9- for Visit: [id number], Complete, Entered, Signed in Full, General   Note Type Oncology Follow Up   HPI: Referred by Gittin.   This 59 year old Male patient presents to the clinic for follow up  Rectal Cancer (1)  Chief Complaint:  Historian Patient (1)   Presenting Problem Pt is here today for chemotherapy.(1)   Positive Symptoms urinary frequency(1)   Negative Symptoms fever, chills, anorexia, weakness, nausea, vomiting, diarrhea, constipation, cough, shortness of breath, palpitations, burning with urination, headache, numbness/tingling, back pain, hip pain, rash(1)   Subjective: Chief Complaint/Diagnosis:   1. Anemia IDA     2. Azotemia, wax and wane since at least 09/12/13, cre was 1.93, wax and wane, 1.37 09/20/13,  1.69 01/04/14, with normal ca, t protein, alb, bili, sgot..cre normal at 1.21 on 06/07/14, then 1.31 on 2/25, then CT dye study, n 1.42 on 3/3   3. Mild elevation lt chains    4. Marland Kitchenadditional medical problems see below, note cardiology eval and cardiac cath prior to colonoscopy        5.Marland KitchenMarland KitchenCANCER DISTAL RECTUM SEEN ON COLONOSCOPY 06/06/14 , NOW STAGED BY CT CHEST ABDO PELVIS AND RECTAL U/S, DONE BY DR BURBRIDGE,   T3 NO MO STAGE 2A,  ALSO VERY TINY 3MM PULM NODULES FELT TO BE BENIGN, ONE 7MM PELVIC NODE FELT TO BE BENIGN   6. CANDIDA SEEN IN MID ESOPHAGUS ON PRIOR EGD HPI:   Initial visit was 01/17/14.Marland KitchenSEE EARLIER NOTES INCLUDING 05/29/14 FOR REVIEW, IRON DEFICIENCY PRIOR AND NOW LUMINAL STUDIES DONE ON 06/06/14, AFTER CARDIOLOGY CLEARANCE  . U/S KIDNEYS 01/25/14  DONE UNREMARKABLE.  ON 10/7 SIEP NEG, LT CHAINS TRIVIAL ELEVATION LAMDA LT CHAINS, RATIO 1.89, NEEDS F/U, NOT SUSPECTED TO BE PATHOLOGIC.  CARDIAC CATH 1/28 DR CALLWOOD, ON PO IRON 1 QD, BID CAUSED SOME GASTRITIS.  COLONOSCOPY BY DR [facility] HAS REVEALED RECTAL TUMOR, .. path confirms adenocarcinoma, PET considered, not approved, the staging by contrast CT,, also rectal u/s done,  all studies reviewed in tumor conference, and cancer staged as above.  07/03/14 WAS  DAY 1 OF XRT AND COMBINED TX, FU INFUSION AS PLANNED   S/P PORT 3/14 ,  TODAY RETURNS , 4/29 was, END OF INFUSIONAL TX. Marland Kitchen BLADDER SYMPTOMS AND URINE FREQUENCY CONTINUE,  SKIN IS BETTER AROUND RECTUM.   Review of Systems:  HEENT: no complaints  Lungs: no complaints  Extremities: SEE CURRENT ILLNESS  Psych: no complaints  Emotional well-being: None   Allergies:  No Known Allergies:   Significant History/PMH:   Rectal Cancer:    Gout:    HTN:    Myocardial Infarction - Dec 2005:    Ureteral stricture repair:    Cardiac Stents - Dec 2005:   Preventive Screening:  Has patient had any of the following test? Colonscopy  Prostate Exam (1)   Last Colonoscopy: never(1)   Last Prostate Exam: 2006(1)   Smoking History: Smoking History smoked for brief period in his teens(1)  PFSH: Family History: noncontributory  Comments: father elderly unknown type of cancer  Social History: negative alcohol  Additional Past Medical and Surgical History: FROM PMD , NO DETAILS PROVIDED, DM, HYPERLIPIDEMIA, HTN, HEMATURIA....NOTE ADDITIONAL MEDICATIONS PRIORON ALLOPURINOL 300MG DILY, REDUCED TO 100MG,  ASA 81 DAILY, TEMPORARILY HELD BUT RESTARTED, PRIOR PLAVIX 75 DAILY,  HELD DUE TO BLEEDING, THEN HELD SINCE, CARDIOLOGY AWARE   Home Medications: Medication Instructions Last Modified Date/Time  Myrbetriq 25 mg oral tablet, extended release 1 tab(s) orally  once a day 25-Apr-16 11:06  Pyridium 200 mg oral tablet 1 tab(s) orally 2 times a day 25-Apr-16 11:06  iron 65 milligram(s) orally once a day 25-Apr-16 11:06  cloNIDine 0.2 mg oral tablet 1 tab(s) orally 2 times a day 25-Apr-16 11:06  colchicine 0.6 mg oral capsule 1 cap(s) orally once a day, As Needed for flare ups... 25-Apr-16 11:06  vitamin E 400 intl units oral capsule 1 cap(s) orally 2 times a day 25-Apr-16 11:06  Fish Oil - oral capsule 1 cap(s) orally  once a day 25-Apr-16 11:06  lisinopril 20 mg oral tablet 1 tab(s) orally once a day (in the morning) 25-Apr-16 11:06   Vital Signs:  Filed Vitals:   08/26/14 1142  Height: 5' 10" (1.778 m)  Weight: 192 lb 10.9 oz (87.4 kg)   Filed Vitals:   08/26/14 1142  BP: 98/64  Pulse: 72  Temp: 98.7 F (37.1 C)  TempSrc: Oral  Resp: 18  Height: 5' 10" (1.778 m)  Weight: 192 lb 10.9 oz (87.4 kg)     Physical Exam:  General: well developed, well nourished, and no acute distress  Mental Status: alert and oriented to person, place and time  Eyes: right slight to minimal residual now not noticable  lid lag,  Head, Ears, Nose,Throat: No thrush No stomatitis  Respiratory: no rales, rhonchi, or wheezing  Cardiovascular: regular rate and rhythm  Gastrointestinal: soft, non tender, no masses or organomegaly  Musculoskeletal: feet, ankles unremarkable and no edema  Skin: no rashes, no bruising  Neurological: No gross focal weakness cranial nerves intact  Lymphatics: , neg for palpable neck supraclavicular submandibular axilla  Psych: mood, affect normal   Laboratory Results: Hepatic:                       Routine Chem:  CBC Latest Ref Rng 08/26/2014 08/12/2014 08/05/2014  WBC 3.8 - 10.6 K/uL 5.7 6.4 9.9  Hemoglobin 13.0 - 18.0 g/dL 11.2(L) 11.7(L) 11.5(L)  Hematocrit 40.0 - 52.0 % 35.4(L) 37.1(L) 36.6(L)  Platelets 150 - 440 K/uL 346 282 458(H)   Lab Results  Component Value Date   CREATININE 1.52* 08/12/2014   CREATININE 1.64* 08/05/2014   CREATININE 1.72* 07/31/2014     Results for [name] [name], [name] [name] (MRN [id number]) as of 08/26/2014 12:28  Ref. Range 08/26/2014 11:19  Sodium Latest Ref Range: 135-145 mmol/L 132 (L)  Potassium Latest Ref Range: 3.5-5.1 mmol/L 4.4  Chloride Latest Ref Range: 101-111 mmol/L 103  CO2 Latest Ref Range: 22-32 mmol/L 22  BUN Latest Ref Range: 6-20 mg/dL 72 (H)  Creatinine Latest Ref Range: 0.61-1.24 mg/dL 1.93 (H)  Calcium Latest Ref Range:  8.9-10.3 mg/dL 9.0  EGFR (Non-African Amer.) Latest Ref Range: >60 mL/min 36 (L)  EGFR (African American) Latest Ref Range: >60 mL/min 42 (L)  Glucose Latest Ref Range: 65-99 mg/dL 109 (H)  Anion gap Latest Ref Range: 5-15  7  Magnesium Latest Ref Range: 1.7-2.4 mg/dL 2.5 (H)  Alkaline Phosphatase Latest Ref Range: 38-126 U/L 69  Albumin Latest Ref Range: 3.5-5.0 g/dL 3.6  AST Latest Ref Range: 15-41 U/L 11 (L)  ALT Latest Ref Range: 17-63 U/L 14 (L)  Total Protein Latest Ref Range: 6.5-8.1 g/dL 6.6  Total Bilirubin Latest Ref Range: 0.3-1.2 mg/dL 0.4  WBC Latest Ref Range: 3.8-10.6 K/uL 5.7  RBC Latest Ref Range: 4.40-5.90 MIL/uL 4.76  Hemoglobin Latest Ref Range: 13.0-18.0 g/dL 11.2 (L)  HCT Latest Ref Range: 40.0-52.0 % 35.4 (L)  MCV Latest Ref Range: 80.0-100.0 fL 74.4 (L)  MCH Latest Ref Range: 26.0-34.0 pg 23.5 (L)  MCHC Latest Ref Range: 32.0-36.0 g/dL 31.6 (L)  RDW Latest Ref Range: 11.5-14.5 % 26.3 (H)  Platelets Latest Ref Range: 150-440 K/uL 346  Neutrophils Latest Units: % 79  Lymphocytes Latest Units: % 5  Monocytes Relative Latest Units: % 12  Eosinophil Latest Units: % 3  Basophil Latest Units: % 1  NEUT# Latest Ref Range: 1.4-6.5 K/uL 4.6  Lymphocyte # Latest Ref Range: 1.0-3.6 K/uL 0.3 (L)  Monocyte # Latest Ref Range: 0.2-1.0 K/uL 0.7  Eosinophils Absolute Latest Ref Range: 0-0.7 K/uL 0.2  Basophils Absolute Latest Ref Range: 0-0.1 K/uL 0.0                              Lab Results Review:  Lab Results   see current illness  Assessment and Plan: Impression:   SEE CURRENT ILLNESS.Marland KitchenMarland KitchenIDA,   AZOTEMIA, WITH NO OBSTRUCTION ON RENAL U/S, NORMAL SIEP, TRIVIAL LT CHAIN ELEVATION, MINIMAL ELEVATION URIC ACID AT 7.9 , SLIGHT BUMP  POST CT SCAN,  PRIOR CANDIDA IN MID ESOPHAGUS ON EGD.....Marland KitchenRECTAL CANCER  ON COLONOSCPY 06/06/14, EUS DONE  NOW STAGED 2A..S/P PORT  ON 07/03/14 WAS DAY 1 WEK ONE OF CHEMOTX AND XRT. TODAY IS  WEEK 7 TX. , CBC, LFT,  STABLE, CRE HAS WAX  AND WANE I  , NO MUCOSITIS OR GI SYMPTOMS.  THERE IS A SLIGHT LID LAG ON THE RIGHT, .. HAS IMPROVED SLOWLY  AS PRIOR NOTED, DISCUSSED WITH DR ADAMS . . today no gout no mucositis no skin or gi toxicity,  STILL HAS IRRITABLE BLADDER. BP LOW. ASYMPTOMATIC, REPORTS SOME FATIGUE, HAS BEEN NAPPIONG, NO ORTHOSTASIS NO DIZZINRSS NO VISIUAL DISTURBANCES Plan:    PRIOR .Marland KitchenDISCUSSED AT LENGTH WITH PATIENT AND WIFE.Marland Kitchen EXPECTING CONFIRMSTION OF PATHOLOGY, RECTAL ADENOCARCINOMA. EXPLAINED REGARDING FURTHUR STAGING, WITH IMAGING AND RECTAL ULTRASOUND, EXPLAINED PLAN TO R/O METASTATIC OR STAGE 4 DISEASE BY IMAGING, EXPLAINED THAT STAGE 4 CANCER IS TREATABLE BUT NOT CURABLE. EXPLAINED THAT LOWER STAGE CANCER OUR USUAL APPROACH IS TO TREAT WWITH CHEMOTC AND RADIATION, INITIALLY FOR 6 WEEKS WITH CHEMOTHERAPY BY CONTINUOUS INFUSION THROUGH A PORT A CATH, THEN SURGERY, WHICH MOST LIKELY WILL BE ABDOMINOPERINEAL RESECTION, DISCUSSED COLOSTOMY, AND SIDE EFFECTS OF CHEMOTX AND RADIATION. DISCUSSED ADDITIONAL TX FOR 18 WEEKS AFTER SURGERY ADVISED IN MOST CASES. DISCUSSED FOLFOX TX, SCHEDULE, SIDE EFFECTS. AS PRIOR NOTED, HAS JUST HAD CARDIAC W/U PRE COLONOSCOPY.  PRIOR  IMAGNG REVIEWED IN TUMOR CONFERENCE . CONFIRMED, PLANS FOR 6 WEEKS 5FU CONTINUOUS INFUSION, PLUS XRT. Marland Kitchen THEN SURGERY, THEN 9 TX FOLFOX..,  ,...TODAY  GIVE IV FLUIDS , WILL REDUCE AND LATER TITRATE BP MEDS, KEEP SURGERY APPT TOMORROW.   LEFT CLINIC IN GOOD CONDITION, BP HIGHER, EXAM STABLE, ASYMPTOMATIC.Marland KitchenMarland KitchenWILL HOLD CLONIDIDE TODAY, WILL BEGIN HALF DOSE BID TOMORROW, IF BP ABOBE 120 SYSTOLOC, CHECING BP TWICE DAILY  HE WILL KEEP APPT FOR DR BIRD AND SEE ME IN 2 WEEKS Advance Directive:  Advance Directive ([place]) no(1)   Advance Directive Information Given patient refused(1)   Electronic Signatures: Dallas Schimke (MD)  (Signed 01-May-16 21:18)  Authored: Note Type, History of Present Illness, CC/HPI, Review of Systems, ALLERGIES, PAST MEDICAL HISTORY,  Preventive Screening, Smoking Cessation, Patient Family Social History, HOME MEDICATIONS, Vital Signs, Physical Exam, Lab Results Review, Assessment and Plan, Advance Directive   Last Updated: 01-May-16 21:18 by Dallas Schimke (MD)  References:  1.  Data Referenced From "[place] Office Nurse Note" 12-Aug-2014 11:04 AM

## 2014-08-27 LAB — CEA: CEA: 2.1 ng/mL (ref 0.0–4.7)

## 2014-08-30 ENCOUNTER — Other Ambulatory Visit: Payer: Self-pay | Admitting: *Deleted

## 2014-08-30 ENCOUNTER — Inpatient Hospital Stay: Payer: Managed Care, Other (non HMO)

## 2014-08-30 ENCOUNTER — Other Ambulatory Visit: Payer: Self-pay | Admitting: Internal Medicine

## 2014-08-30 DIAGNOSIS — C20 Malignant neoplasm of rectum: Secondary | ICD-10-CM

## 2014-08-30 DIAGNOSIS — N189 Chronic kidney disease, unspecified: Secondary | ICD-10-CM

## 2014-08-30 LAB — CREATININE, SERUM
Creatinine, Ser: 1.57 mg/dL — ABNORMAL HIGH (ref 0.61–1.24)
GFR, EST AFRICAN AMERICAN: 54 mL/min — AB (ref >60)
GFR, EST NON AFRICAN AMERICAN: 46 mL/min — AB (ref >60)

## 2014-09-13 ENCOUNTER — Other Ambulatory Visit: Payer: Self-pay | Admitting: *Deleted

## 2014-09-13 ENCOUNTER — Telehealth: Payer: Self-pay | Admitting: *Deleted

## 2014-09-13 MED ORDER — MIRABEGRON ER 25 MG PO TB24: 25.0000 mg | ORAL_TABLET | Freq: Every day | ORAL | Status: DC

## 2014-09-13 MED ORDER — TAMSULOSIN HCL 0.4 MG PO CAPS: 0.4000 mg | ORAL_CAPSULE | Freq: Every day | ORAL | Status: DC

## 2014-09-13 NOTE — Telephone Encounter (Signed)
Having red colored urine and voiding every 20 mins which is burning with urination

## 2014-09-18 ENCOUNTER — Ambulatory Visit: Payer: Self-pay | Admitting: Radiation Oncology

## 2014-09-19 ENCOUNTER — Telehealth: Payer: Self-pay | Admitting: *Deleted

## 2014-09-19 NOTE — Telephone Encounter (Signed)
Pt's completed FMLA form faxed to the attention of Talbot Grumbling with [employer] at fax # [phone] & office phone [phone].Marland KitchenMarland Kitchen

## 2014-09-26 ENCOUNTER — Other Ambulatory Visit: Payer: Self-pay | Admitting: *Deleted

## 2014-09-26 ENCOUNTER — Telehealth: Payer: Self-pay | Admitting: *Deleted

## 2014-09-26 DIAGNOSIS — C20 Malignant neoplasm of rectum: Secondary | ICD-10-CM

## 2014-09-26 NOTE — Telephone Encounter (Signed)
Per pt's wife, pt needs an appointment to follow up with lab work. Was last seen in may and says is usually seen every 2 weeks for labs. Informed pt's wife that Dr. Inez Pilgrim is no longer with the practice and pt's care will need to transferred to another MD at the cancer center. Pt's wife says would like to transfer care to Dr. Mike Gip. Will consult with Dr. Mike Gip to set up appt and pt will be notified.

## 2014-09-26 NOTE — Telephone Encounter (Signed)
Pt will need follow up prior to appt with Dr. Marina Gravel which is scheduled on 6/21. Will arrange for appt with labs per Dr. Mike Gip.

## 2014-10-04 ENCOUNTER — Inpatient Hospital Stay: Payer: Managed Care, Other (non HMO)

## 2014-10-04 ENCOUNTER — Inpatient Hospital Stay: Payer: Managed Care, Other (non HMO) | Attending: Hematology and Oncology | Admitting: Hematology and Oncology

## 2014-10-04 ENCOUNTER — Encounter: Payer: Self-pay | Admitting: Hematology and Oncology

## 2014-10-04 VITALS — BP 124/78 | HR 69 | Temp 97.9°F | Resp 16 | Wt 192.2 lb

## 2014-10-04 DIAGNOSIS — Z923 Personal history of irradiation: Secondary | ICD-10-CM | POA: Insufficient documentation

## 2014-10-04 DIAGNOSIS — C20 Malignant neoplasm of rectum: Secondary | ICD-10-CM | POA: Diagnosis present

## 2014-10-04 DIAGNOSIS — I739 Peripheral vascular disease, unspecified: Secondary | ICD-10-CM | POA: Insufficient documentation

## 2014-10-04 DIAGNOSIS — R350 Frequency of micturition: Secondary | ICD-10-CM | POA: Diagnosis not present

## 2014-10-04 DIAGNOSIS — Z9221 Personal history of antineoplastic chemotherapy: Secondary | ICD-10-CM

## 2014-10-04 DIAGNOSIS — D509 Iron deficiency anemia, unspecified: Secondary | ICD-10-CM | POA: Diagnosis not present

## 2014-10-04 DIAGNOSIS — R300 Dysuria: Secondary | ICD-10-CM

## 2014-10-04 DIAGNOSIS — I10 Essential (primary) hypertension: Secondary | ICD-10-CM | POA: Diagnosis not present

## 2014-10-04 DIAGNOSIS — E785 Hyperlipidemia, unspecified: Secondary | ICD-10-CM | POA: Insufficient documentation

## 2014-10-04 LAB — URINALYSIS COMPLETE WITH MICROSCOPIC (ARMC ONLY)
Bilirubin Urine: NEGATIVE
Glucose, UA: 50 mg/dL — AB
Ketones, ur: NEGATIVE mg/dL
Nitrite: NEGATIVE
Protein, ur: 100 mg/dL — AB
Specific Gravity, Urine: 1.016 (ref 1.005–1.030)
Squamous Epithelial / LPF: NONE SEEN
pH: 5 (ref 5.0–8.0)

## 2014-10-04 LAB — BASIC METABOLIC PANEL
Anion gap: 10 (ref 5–15)
BUN: 16 mg/dL (ref 6–20)
CO2: 27 mmol/L (ref 22–32)
Calcium: 8.6 mg/dL — ABNORMAL LOW (ref 8.9–10.3)
Chloride: 104 mmol/L (ref 101–111)
Creatinine, Ser: 1.17 mg/dL (ref 0.61–1.24)
GFR calc Af Amer: >60 mL/min (ref >60)
GFR calc non Af Amer: >60 mL/min (ref >60)
Glucose, Bld: 126 mg/dL — ABNORMAL HIGH (ref 65–99)
Potassium: 3.8 mmol/L (ref 3.5–5.1)
Sodium: 141 mmol/L (ref 135–145)

## 2014-10-04 LAB — CBC WITH DIFFERENTIAL/PLATELET
Basophils Absolute: 0 10*3/uL (ref 0–0.1)
Basophils Relative: 1 %
Eosinophils Absolute: 0.3 10*3/uL (ref 0–0.7)
Eosinophils Relative: 6 %
HCT: 36.9 % — ABNORMAL LOW (ref 40.0–52.0)
Hemoglobin: 11.4 g/dL — ABNORMAL LOW (ref 13.0–18.0)
Lymphocytes Relative: 7 %
Lymphs Abs: 0.4 10*3/uL — ABNORMAL LOW (ref 1.0–3.6)
MCH: 24.6 pg — ABNORMAL LOW (ref 26.0–34.0)
MCHC: 30.9 g/dL — ABNORMAL LOW (ref 32.0–36.0)
MCV: 79.6 fL — ABNORMAL LOW (ref 80.0–100.0)
Monocytes Absolute: 0.4 10*3/uL (ref 0.2–1.0)
Monocytes Relative: 6 %
Neutro Abs: 4.6 10*3/uL (ref 1.4–6.5)
Neutrophils Relative %: 80 %
Platelets: 274 10*3/uL (ref 150–440)
RBC: 4.63 MIL/uL (ref 4.40–5.90)
RDW: 23.4 % — ABNORMAL HIGH (ref 11.5–14.5)
WBC: 5.7 10*3/uL (ref 3.8–10.6)

## 2014-10-04 LAB — IRON AND TIBC
Iron: 39 ug/dL — ABNORMAL LOW (ref 45–182)
Saturation Ratios: 15 % — ABNORMAL LOW (ref 17.9–39.5)
TIBC: 270 ug/dL (ref 250–450)
UIBC: 231 ug/dL

## 2014-10-04 LAB — FERRITIN: Ferritin: 34 ng/mL (ref 24–336)

## 2014-10-04 MED ORDER — OXYCODONE-ACETAMINOPHEN 5-325 MG PO TABS: 1.0000 | ORAL_TABLET | Freq: Four times a day (QID) | ORAL | Status: DC | PRN

## 2014-10-04 NOTE — Progress Notes (Signed)
Oncology Nurse Navigator Documentation  Oncology Nurse Navigator Flowsheets 10/04/2014  Patient Visit Type Follow-up  Time Spent with Patient 15

## 2014-10-05 NOTE — Progress Notes (Signed)
[facility] day:  10/04/2014  Chief Complaint: [name] Wickliff [name] is a 59 y.o. male  with clinical stage IIA rectal cancer s/p neoadjuvant chemotherapy and radiation who is seen for initial assessment.  HPI:  The patient presented with iron deficiency anemia and rectal bleeding. Colonoscopy on 06/06/2014 revealed a large non-circumferential distal rectal mass. Biopsy was positive for adenocarcinoma. He underwent CT scans which revealed a 3 mm pulmonary nodule (indeterminate). There was a large rectal mass suspicious for transmural extension into the mesorectum.There was equivocal perirectal and sigmoid mesocolon nodes. There was no liver metastasis or extrapelvic disease.  He underwent endoscopic ultrasound to [facility].  The mass was 9-14 cm from the anal verge, approximately 70% circumferential and measuring 9 mm in maximal thickness. Imaging studies suggested breakthrough of the muscularis propria with invasion into the perirectal fat.  He went neoadjuvant chemotherapy and radiation. He received continuous infusion 5-fluorouracil from 07/03/2014 until 08/12/2014. He received radiation from 07/03/2014 until 08/16/2014. He tolerated his treatment well except for bladder irritation. Rectal bleeding stopped within the first week of treatment.  He continues to be bothered by his bladder urinary frequency and burning. Urine cultures have been negative. He notes blood in his urine after he takes Tylenol 1000 mg twice a day.  He is on a daily baby aspirin.  He denies any ibuprofen use.  He notes that he was recently seen at [facility] for his peripheral vascular disease. A stent or bypass is being plannned. He states that he requires a cardiac stent in the future but that this can wait until after his treatment.  Past Medical History  Diagnosis Date  . Rectal cancer   . Cancer   . Iron deficiency anemia   . Pulmonary nodules    . Hypertension   . Hyperlipidemia   . Hematuria   . Colon cancer     Past Surgical History  Procedure Laterality Date  . Colonoscopy 06/06/14    . Colonoscopy with esophagogastroduodenoscopy (egd)      No family history on file.  Social History:  reports that he has been smoking.  He does not have any smokeless tobacco history on file. He reports that he does not drink alcohol or use illicit drugs.  The patient is accompanied by his wife, Meredith Mody, today.  Allergies:  Allergies  Allergen Reactions  . No Known Allergies     Current Medications: Current Outpatient Prescriptions  Medication Sig Dispense Refill  . aspirin EC 81 MG tablet Take by mouth.    . cloNIDine (CATAPRES) 0.2 MG tablet Take 0.2 mg by mouth 2 (two) times daily.    . colchicine 0.6 MG tablet Take 0.6 mg by mouth daily.    . Ferrous Sulfate (IRON) 325 (65 FE) MG TABS Take 1 tablet by mouth daily.    Marland Kitchen lisinopril (PRINIVIL,ZESTRIL) 20 MG tablet Take 20 mg by mouth daily. In the morning    . mirabegron ER (MYRBETRIQ) 25 MG TB24 tablet Take 1 tablet (25 mg total) by mouth daily. 30 tablet 2  . Omega-3 Fatty Acids (FISH OIL) 1000 MG CAPS Take 1 capsule by mouth daily.    . phenazopyridine (PYRIDIUM) 200 MG tablet Take 200 mg by mouth 3 (three) times daily as needed for pain.    . tamsulosin (FLOMAX) 0.4 MG CAPS capsule Take 1 capsule (0.4 mg total) by mouth daily after supper. 30 capsule 6  . vitamin E 400 UNIT  capsule Take 400 Units by mouth 2 (two) times daily.    Marland Kitchen oxyCODONE-acetaminophen (PERCOCET/ROXICET) 5-325 MG per tablet Take 1 tablet by mouth every 6 (six) hours as needed for severe pain. 30 tablet 0   No current facility-administered medications for this visit.   Facility-Administered Medications Ordered in Other Visits  Medication Dose Route Frequency Provider Last Rate Last Dose  . sodium chloride 0.9 % injection 10 mL  10 mL Intravenous PRN Dallas Schimke, MD   10 mL at 08/26/14 1300    Review of  Systems:  GENERAL:  Feels good.  Active.  No fevers, sweats or weight loss. PERFORMANCE STATUS (ECOG):  1 HEENT:  No visual changes, runny nose, sore throat, mouth sores or tenderness. Lungs: No shortness of breath or cough.  No hemoptysis. Cardiac:  No chest pain, palpitations, orthopnea, or PND. GI:  No nausea, vomiting, diarrhea, constipation, melena or hematochezia. GU:  Urgency, frequency, dysuria, and intermittent hematuria. Musculoskeletal:  No back pain.  No joint pain.  No muscle tenderness. Extremities:  No pain or swelling. Skin:  No rashes or skin changes. Neuro:  No headache, numbness or weakness, balance or coordination issues. Endocrine:  No diabetes, thyroid issues, hot flashes or night sweats. Psych:  No mood changes, depression or anxiety. Pain:  Lower pelvic pain. Review of systems:  All other systems reviewed and found to be negative.   Physical Exam: Blood pressure 124/78, pulse 69, temperature 97.9 F (36.6 C), temperature source Tympanic, resp. rate 16, weight 192 lb 3.9 oz (87.2 kg).  GENERAL:  Well developed, well nourished, sitting comfortably in the exam room in no acute distress.  He has a cane at his side. MENTAL STATUS:  Alert and oriented to person, place and time. HEAD:  Brown hair.  Mustache.  Normocephalic, atraumatic, face symmetric, no Cushingoid features. EYES:  Brown eyes.  Pupils equal round and reactive to light and accomodation.  No conjunctivitis or scleral icterus. ENT:  Oropharynx clear without lesion.  Tongue normal. Mucous membranes moist.  RESPIRATORY:  Clear to auscultation without rales, wheezes or rhonchi. CARDIOVASCULAR:  Regular rate and rhythm without murmur, rub or gallop. ABDOMEN:  Slight discomfort on deep palpation in the suprapubic region.  Soft, with active bowel sounds, and no hepatosplenomegaly.  No masses. RECTAL:  Hyperpigmentation secondary to radiation.  No skin breakdown. SKIN:  No rashes, ulcers or lesions. EXTREMITIES:  No edema, no skin discoloration or tenderness.  No palpable cords. LYMPH NODES: No palpable cervical, supraclavicular, axillary or inguinal adenopathy  NEUROLOGICAL: Unremarkable. PSYCH:  Appropriate.   Appointment on 10/04/2014  Component Date Value Ref Range Status  . WBC 10/04/2014 5.7  3.8 - 10.6 K/uL Final  . RBC 10/04/2014 4.63  4.40 - 5.90 MIL/uL Final  . Hemoglobin 10/04/2014 11.4* 13.0 - 18.0 g/dL Final  . HCT 10/04/2014 36.9* 40.0 - 52.0 % Final  . MCV 10/04/2014 79.6* 80.0 - 100.0 fL Final  . MCH 10/04/2014 24.6* 26.0 - 34.0 pg Final  . MCHC 10/04/2014 30.9* 32.0 - 36.0 g/dL Final  . RDW 10/04/2014 23.4* 11.5 - 14.5 % Final  . Platelets 10/04/2014 274  150 - 440 K/uL Final  . Neutrophils Relative % 10/04/2014 80   Final  . Neutro Abs 10/04/2014 4.6  1.4 - 6.5 K/uL Final  . Lymphocytes Relative 10/04/2014 7   Final  . Lymphs Abs 10/04/2014 0.4* 1.0 - 3.6 K/uL Final  . Monocytes Relative 10/04/2014 6   Final  . Monocytes Absolute 10/04/2014  0.4  0.2 - 1.0 K/uL Final  . Eosinophils Relative 10/04/2014 6   Final  . Eosinophils Absolute 10/04/2014 0.3  0 - 0.7 K/uL Final  . Basophils Relative 10/04/2014 1   Final  . Basophils Absolute 10/04/2014 0.0  0 - 0.1 K/uL Final  . Sodium 10/04/2014 141  135 - 145 mmol/L Final  . Potassium 10/04/2014 3.8  3.5 - 5.1 mmol/L Final  . Chloride 10/04/2014 104  101 - 111 mmol/L Final  . CO2 10/04/2014 27  22 - 32 mmol/L Final  . Glucose, Bld 10/04/2014 126* 65 - 99 mg/dL Final  . BUN 10/04/2014 16  6 - 20 mg/dL Final  . Creatinine, Ser 10/04/2014 1.17  0.61 - 1.24 mg/dL Final  . Calcium 10/04/2014 8.6* 8.9 - 10.3 mg/dL Final  . GFR calc non Af Amer 10/04/2014 >60  >60 mL/min Final  . GFR calc Af Amer 10/04/2014 >60  >60 mL/min Final   Comment: (NOTE) The eGFR has been calculated using the CKD EPI equation. This calculation has not been validated in all clinical situations. eGFR's persistently <60 mL/min signify possible Chronic  Kidney Disease.   . Anion gap 10/04/2014 10  5 - 15 Final  . Ferritin 10/04/2014 34  24 - 336 ng/mL Final  . Iron 10/04/2014 39* 45 - 182 ug/dL Final  . TIBC 10/04/2014 270  250 - 450 ug/dL Final  . Saturation Ratios 10/04/2014 15* 17.9 - 39.5 % Final  . UIBC 10/04/2014 231   Final  . Color, Urine 10/04/2014 RED* YELLOW Final  . APPearance 10/04/2014 CLOUDY* CLEAR Final  . Glucose, UA 10/04/2014 50* NEGATIVE mg/dL Final  . Bilirubin Urine 10/04/2014 NEGATIVE  NEGATIVE Final  . Ketones, ur 10/04/2014 NEGATIVE  NEGATIVE mg/dL Final  . Specific Gravity, Urine 10/04/2014 1.016  1.005 - 1.030 Final  . Hgb urine dipstick 10/04/2014 3+* NEGATIVE Final  . pH 10/04/2014 5.0  5.0 - 8.0 Final  . Protein, ur 10/04/2014 100* NEGATIVE mg/dL Final  . Nitrite 10/04/2014 NEGATIVE  NEGATIVE Final  . Leukocytes, UA 10/04/2014 2+* NEGATIVE Final  . RBC / HPF 10/04/2014 TOO NUMEROUS TO COUNT  0 - 5 RBC/hpf Final  . WBC, UA 10/04/2014 TOO NUMEROUS TO COUNT  0 - 5 WBC/hpf Final  . Bacteria, UA 10/04/2014 MANY* NONE SEEN Final  . Squamous Epithelial / LPF 10/04/2014 NONE SEEN  NONE SEEN Final  . Mucous 10/04/2014 PRESENT   Final    Assessment:  [name] Ripberger [name] is a 59 y.o. male with clinical stage IIA (T3N0) rectal cancer.  Colonoscopy on 06/06/2014 revealed a large non-circumferential distal rectal mass. Biopsy was positive for adenocarcinoma.   CT scans revealed a 3 mm pulmonary nodule (indeterminate). There was a large rectal mass suspicious for transmural extension into the mesorectum.There was equivocal perirectal and sigmoid mesocolon nodes. There was no liver metastasis or extrapelvic disease.  Endoscopic ultrasound at [facility] revealed a 70% circumferential mass at 9-14 cm from the anal verge and measuring 9 mm in maximal thickness. Ultrasound suggested breakthrough of the muscularis propria with invasion into the perirectal fat.  He received neoadjuvant chemotherapy (continuous  infusion 5FU) and radiation. He received radiation from 07/03/2014 until 08/16/2014. He has ongoing issues with his bladder likely caused by radiation.  He has significant peripheral vascular disease. A stent or bypass is being plannned.   Plan: 1. Review entire medical history, diagnoses of iron deficiency anemia and treatment, diagnoses of a rectal cancer. Discuss treatment to date  including neoadjuvant chemotherapy and radiation. Discuss the plan for surgery followed by adjuvant chemotherapy (FOLFOX) for a total of 6 months of therapy.  The side effects of chemotherapy were reviewed. 2. Discuss issues with peripheral vascular disease. I feel that the treatment for his rectal cancer should take precedence unless vascular issues cannot wait. 3. Follow-up with Dr. Pat Patrick as scheduled on 10/08/2014. Anticipate low anterior resection on 10/23/2014 4. Labs today:  CBC with diff, BMP, ferritin, iron studies. 5. Urinalysis and culture 6. Prescription for Percocet 5/325 1 tab by mouth every 6 hours, dispense #30. 7. Discuss use of Pyridium. 8. Patient to keep pain diary to assess need for pain patch (requested by patient). 9. RTC on 10/14/2014 for MD assessment.  Lequita Asal, MD  10/04/2014, 6:56 PM

## 2014-10-06 ENCOUNTER — Telehealth: Payer: Self-pay | Admitting: Hematology and Oncology

## 2014-10-06 ENCOUNTER — Other Ambulatory Visit: Payer: Self-pay | Admitting: Hematology and Oncology

## 2014-10-06 DIAGNOSIS — R319 Hematuria, unspecified: Principal | ICD-10-CM

## 2014-10-06 DIAGNOSIS — N390 Urinary tract infection, site not specified: Secondary | ICD-10-CM | POA: Insufficient documentation

## 2014-10-06 LAB — URINE CULTURE: Culture: >=100000

## 2014-10-06 MED ORDER — CIPROFLOXACIN HCL 500 MG PO TABS: 500.0000 mg | ORAL_TABLET | Freq: Two times a day (BID) | ORAL | Status: DC

## 2014-10-06 NOTE — Telephone Encounter (Signed)
Re:  Urine culture  Today I spoke with the patient's wife regarding urine culture obtained from 10/04/2014. Urine culture reveals greater than 100,000 gram-negative rods. He has a urinary tract infection.  I discussed initiation of antibiotics. I inquired about his allergies. He has been on ciprofloxacin int the past. He tolerated this well  A prescription for ciprofloxacin 500 mg every 12 hours for 7 days was sent to the patient's pharmacy at [facility]. I asked the patient's wife to contact the clinic if the prescription was not at the pharmacy.  Lequita Asal, MD

## 2014-10-08 ENCOUNTER — Ambulatory Visit (INDEPENDENT_AMBULATORY_CARE_PROVIDER_SITE_OTHER): Payer: Managed Care, Other (non HMO) | Admitting: Surgery

## 2014-10-08 ENCOUNTER — Encounter: Payer: Self-pay | Admitting: Surgery

## 2014-10-08 VITALS — BP 162/95 | HR 71 | Temp 98.9°F | Ht 66.0 in | Wt 193.6 lb

## 2014-10-08 DIAGNOSIS — C20 Malignant neoplasm of rectum: Secondary | ICD-10-CM

## 2014-10-08 DIAGNOSIS — E1159 Type 2 diabetes mellitus with other circulatory complications: Secondary | ICD-10-CM

## 2014-10-08 DIAGNOSIS — K222 Esophageal obstruction: Secondary | ICD-10-CM

## 2014-10-08 DIAGNOSIS — I739 Peripheral vascular disease, unspecified: Secondary | ICD-10-CM | POA: Diagnosis not present

## 2014-10-08 DIAGNOSIS — I70219 Atherosclerosis of native arteries of extremities with intermittent claudication, unspecified extremity: Secondary | ICD-10-CM

## 2014-10-08 MED ORDER — ERTAPENEM SODIUM 1 G IJ SOLR: 1.0000 g | Freq: Once | INTRAMUSCULAR | Status: DC

## 2014-10-08 MED ORDER — BISACODYL EC 5 MG PO TBEC: DELAYED_RELEASE_TABLET | ORAL | Status: DC

## 2014-10-08 MED ORDER — ENOXAPARIN SODIUM 150 MG/ML ~~LOC~~ SOLN: 40.0000 mg | SUBCUTANEOUS | Status: DC

## 2014-10-08 MED ORDER — POLYETHYLENE GLYCOL POWD: Status: DC

## 2014-10-08 NOTE — Addendum Note (Signed)
Addended by: Sherri Rad on: 10/08/2014 01:55 PM   Modules accepted: Orders

## 2014-10-08 NOTE — Patient Instructions (Signed)
You will have surgery scheduled on 10/23/14.  Please follow the Colon Prep Instructions you were given at today's visit.   Your Pre-admission appointment is set for 10/09/14 at 1045am. Please ensure you make this appointment.  Call our office with any questions or concerns.

## 2014-10-08 NOTE — Progress Notes (Signed)
Subjective:     Patient ID: [name] Kehr [name], male   DOB: [birth year], 59 y.o.   MRN: [id number]  HPI  59 year old male who is now completed chemoradiation therapy on April 25. He is here for his preoperative visit. He is doing well. His main complaint is that of urinary frequency. He was treated for urinary tract infection by the oncology center with Cipro. He is had no further rectal bleeding. He is accompanied today by his brother. Today's office visit was primarily for the consultation purposes of discussion surgery.  Review of Systems  Constitutional: Positive for fatigue. Negative for fever, chills and unexpected weight change.  HENT: Negative.   Respiratory: Negative.   Cardiovascular: Negative for chest pain and leg swelling.  Genitourinary: Positive for dysuria, urgency, frequency and difficulty urinating. Negative for hematuria, flank pain, discharge, scrotal swelling, penile pain and testicular pain.  Allergic/Immunologic: Negative.   Neurological: Negative.   Hematological: Negative.   Psychiatric/Behavioral: Negative.   All other systems reviewed and are negative.      Objective:   Physical Exam  Constitutional: He is oriented to person, place, and time. He appears well-developed and well-nourished. No distress.  HENT:  Head: Normocephalic and atraumatic.  Eyes: Conjunctivae are normal. Pupils are equal, round, and reactive to light.  Neck: Normal range of motion. Neck supple.  Cardiovascular: Normal rate and regular rhythm.   Pulmonary/Chest: Effort normal and breath sounds normal. No respiratory distress.  Abdominal: Soft.  Genitourinary:  I can feel the tip of the rectal mass at the end of my examining finger approximately 9 cm from the anal verge.  Musculoskeletal: He exhibits no edema.  Neurological: He is alert and oriented to person, place, and time.  Skin: Skin is warm and dry.  Psychiatric: He has a normal mood and affect. His behavior is normal. Judgment  and thought content normal.       Assessment:     59 year-old male with stage IIA rectal adenocarcinoma by clinical and staging examination who has now completed chemoradiation therapy preoperatively. I discussed with him and his brother again in the office attempted low anterior resection with diverting loop ileostomy the risks of surgery including that of bleeding infection ureteral injury possibility of an APR and the expected postoperative recovery time.    Plan:     I discussed with him at length in excess of 60 minutes and in addition reviewed notes from the oncology center.  Plan is for a low anterior resection versus abdominal perineal resection with or without diverting loop ileostomy on October 23, 2014.   Plan is cystoscopy and bilateral ureteral stent placement with Dr Erlene Quan of Urology.

## 2014-10-08 NOTE — Addendum Note (Signed)
Addended by: Phillips Odor on: 10/08/2014 02:29 PM   Modules accepted: Orders

## 2014-10-09 ENCOUNTER — Encounter
Admission: RE | Admit: 2014-10-09 | Discharge: 2014-10-09 | Disposition: A | Discharge status: Home / Self Care | Payer: Managed Care, Other (non HMO) | Source: Ambulatory Visit | Attending: Surgery | Admitting: Surgery

## 2014-10-09 DIAGNOSIS — R319 Hematuria, unspecified: Secondary | ICD-10-CM | POA: Insufficient documentation

## 2014-10-09 DIAGNOSIS — I10 Essential (primary) hypertension: Secondary | ICD-10-CM | POA: Insufficient documentation

## 2014-10-09 DIAGNOSIS — I2510 Atherosclerotic heart disease of native coronary artery without angina pectoris: Secondary | ICD-10-CM | POA: Insufficient documentation

## 2014-10-09 DIAGNOSIS — R0602 Shortness of breath: Secondary | ICD-10-CM | POA: Insufficient documentation

## 2014-10-09 DIAGNOSIS — Z01810 Encounter for preprocedural cardiovascular examination: Secondary | ICD-10-CM | POA: Insufficient documentation

## 2014-10-09 DIAGNOSIS — E119 Type 2 diabetes mellitus without complications: Secondary | ICD-10-CM | POA: Insufficient documentation

## 2014-10-09 DIAGNOSIS — I739 Peripheral vascular disease, unspecified: Secondary | ICD-10-CM | POA: Insufficient documentation

## 2014-10-09 DIAGNOSIS — Z01812 Encounter for preprocedural laboratory examination: Secondary | ICD-10-CM | POA: Diagnosis present

## 2014-10-09 DIAGNOSIS — E785 Hyperlipidemia, unspecified: Secondary | ICD-10-CM | POA: Diagnosis not present

## 2014-10-09 DIAGNOSIS — M109 Gout, unspecified: Secondary | ICD-10-CM | POA: Insufficient documentation

## 2014-10-09 DIAGNOSIS — N390 Urinary tract infection, site not specified: Secondary | ICD-10-CM | POA: Diagnosis not present

## 2014-10-09 DIAGNOSIS — C20 Malignant neoplasm of rectum: Secondary | ICD-10-CM | POA: Diagnosis not present

## 2014-10-09 LAB — CBC
HCT: 35.9 % — ABNORMAL LOW (ref 40.0–52.0)
HEMOGLOBIN: 11.3 g/dL — AB (ref 13.0–18.0)
MCH: 25.2 pg — ABNORMAL LOW (ref 26.0–34.0)
MCHC: 31.6 g/dL — AB (ref 32.0–36.0)
MCV: 79.9 fL — AB (ref 80.0–100.0)
Platelets: 299 10*3/uL (ref 150–440)
RBC: 4.49 MIL/uL (ref 4.40–5.90)
RDW: 21.9 % — ABNORMAL HIGH (ref 11.5–14.5)
WBC: 6 10*3/uL (ref 3.8–10.6)

## 2014-10-09 LAB — TYPE AND SCREEN
ABO/RH(D): A POS
ANTIBODY SCREEN: NEGATIVE

## 2014-10-09 LAB — DIFFERENTIAL
Basophils Absolute: 0 10*3/uL (ref 0–0.1)
Basophils Relative: 0 %
EOS ABS: 0.2 10*3/uL (ref 0–0.7)
EOS PCT: 4 %
LYMPHS ABS: 0.4 10*3/uL — AB (ref 1.0–3.6)
Lymphocytes Relative: 6 %
MONO ABS: 0.4 10*3/uL (ref 0.2–1.0)
Monocytes Relative: 7 %
Neutro Abs: 4.9 10*3/uL (ref 1.4–6.5)
Neutrophils Relative %: 83 %

## 2014-10-09 LAB — BLOOD GAS, ARTERIAL
Acid-Base Excess: 2.8 mmol/L (ref 0.0–3.0)
Allens test (pass/fail): POSITIVE — AB
BICARBONATE: 27.2 meq/L (ref 21.0–28.0)
FIO2: 0.21 %
O2 Saturation: 97.9 %
PH ART: 7.44 (ref 7.350–7.450)
Patient temperature: 37
pCO2 arterial: 40 mmHg (ref 32.0–48.0)
pO2, Arterial: 98 mmHg (ref 83.0–108.0)

## 2014-10-09 LAB — COMPREHENSIVE METABOLIC PANEL
ALT: 13 U/L — ABNORMAL LOW (ref 17–63)
AST: 18 U/L (ref 15–41)
Albumin: 3.6 g/dL (ref 3.5–5.0)
Alkaline Phosphatase: 76 U/L (ref 38–126)
Anion gap: 8 (ref 5–15)
BILIRUBIN TOTAL: 0.5 mg/dL (ref 0.3–1.2)
BUN: 19 mg/dL (ref 6–20)
CO2: 29 mmol/L (ref 22–32)
Calcium: 9.5 mg/dL (ref 8.9–10.3)
Chloride: 105 mmol/L (ref 101–111)
Creatinine, Ser: 1.24 mg/dL (ref 0.61–1.24)
GFR calc Af Amer: >60 mL/min (ref >60)
Glucose, Bld: 94 mg/dL (ref 65–99)
POTASSIUM: 4.4 mmol/L (ref 3.5–5.1)
SODIUM: 142 mmol/L (ref 135–145)
Total Protein: 6.4 g/dL — ABNORMAL LOW (ref 6.5–8.1)

## 2014-10-09 LAB — ABO/RH: ABO/RH(D): A POS

## 2014-10-09 NOTE — Patient Instructions (Signed)
  Your procedure is scheduled on: Wednesday 7/6  Report to Day Surgery. [facility] Entrance To find out your arrival time please call [phone] between 1PM - 3PM on Tuesday 7/5.  Remember: Instructions that are not followed completely may result in serious medical risk, up to and including death, or upon the discretion of your surgeon and anesthesiologist your surgery may need to be rescheduled.    __X__ 1. Do not eat food or drink liquids after midnight. No gum chewing or hard candies.     __X__ 2. No Alcohol for 24 hours before or after surgery.   ____ 3. Bring all medications with you on the day of surgery if instructed.    __X__ 4. Notify your doctor if there is any change in your medical condition     (cold, fever, infections).     Do not wear jewelry, make-up, hairpins, clips or nail polish.  Do not wear lotions, powders, or perfumes.   Do not shave 48 hours prior to surgery. Men may shave face and neck.  Do not bring valuables to the hospital.    [facility] is not responsible for any belongings or valuables.               Contacts, dentures or bridgework may not be worn into surgery.  Leave your suitcase in the car. After surgery it may be brought to your room.  For patients admitted to the hospital, discharge time is determined by your                treatment team.   Patients discharged the day of surgery will not be allowed to drive home.   Please read over the following fact sheets that you were given:   Surgical Site Infection Prevention   __X__ Take these medicines the morning of surgery with A SIP OF WATER:    1. CLONIDINE  2.   3.   4.  5.  6.  ____ Fleet Enema (as directed)   ____ Use CHG Soap as directed  ____ Use inhalers on the day of surgery  ____ Stop metformin 2 days prior to surgery    ____ Take 1/2 of usual insulin dose the night before surgery and none on the morning of surgery.   __X__ Stop Coumadin/Plavix/aspirin on STOP ASPIRIN 10  DAYS BEFORE SURGERY  ____ Stop Anti-inflammatories on    __X__ Stop supplements until after surgery.  STOP FISH OIL AND VITAMIN E 7 DAYS BEFORE SURGERY  ____ Bring C-Pap to the hospital.

## 2014-10-14 ENCOUNTER — Inpatient Hospital Stay: Payer: Managed Care, Other (non HMO) | Admitting: Hematology and Oncology

## 2014-10-15 ENCOUNTER — Other Ambulatory Visit: Payer: Self-pay

## 2014-10-15 DIAGNOSIS — C20 Malignant neoplasm of rectum: Secondary | ICD-10-CM

## 2014-10-17 ENCOUNTER — Inpatient Hospital Stay: Payer: Managed Care, Other (non HMO)

## 2014-10-17 DIAGNOSIS — C20 Malignant neoplasm of rectum: Secondary | ICD-10-CM

## 2014-10-17 LAB — URINALYSIS COMPLETE WITH MICROSCOPIC (ARMC ONLY)
Bacteria, UA: NONE SEEN
Bilirubin Urine: NEGATIVE
Budding Yeast: NEGATIVE
Glucose, UA: NEGATIVE mg/dL
Ketones, ur: NEGATIVE mg/dL
Nitrite: NEGATIVE
Protein, ur: 30 mg/dL — AB
Specific Gravity, Urine: 1.014 (ref 1.005–1.030)
Squamous Epithelial / LPF: NONE SEEN
pH: 5 (ref 5.0–8.0)

## 2014-10-18 ENCOUNTER — Telehealth: Payer: Self-pay | Admitting: *Deleted

## 2014-10-18 LAB — URINE CULTURE: Culture: NO GROWTH

## 2014-10-18 NOTE — Telephone Encounter (Signed)
Call returned to [facility] per Dr Mike Gip there is no growth on culture and he just completed abx, if the culture grows anything in the next few days, he will be notified and abx will be ordered

## 2014-10-22 ENCOUNTER — Telehealth: Payer: Self-pay | Admitting: Surgery

## 2014-10-22 NOTE — Telephone Encounter (Signed)
[name] [name] has been obtained for CPT: 83507 for low anterior resection open sigmoid with Dr Pat Patrick 10/24/14--Inpatient.  4 inpatient stay days approved 10/24/14-10/28/14. Per Hassel Neth  AUTH# [id number].

## 2014-10-23 ENCOUNTER — Inpatient Hospital Stay
Admission: RE | Admit: 2014-10-23 | Discharge: 2014-11-01 | DRG: 331 | Disposition: A | Discharge status: Home Health | Payer: Managed Care, Other (non HMO) | Source: Ambulatory Visit | Attending: Surgery | Admitting: Surgery

## 2014-10-23 ENCOUNTER — Encounter
Admission: RE | Disposition: A | Discharge status: Home Health | Payer: Self-pay | Source: Ambulatory Visit | Attending: Surgery

## 2014-10-23 ENCOUNTER — Inpatient Hospital Stay: Payer: Managed Care, Other (non HMO)

## 2014-10-23 ENCOUNTER — Inpatient Hospital Stay: Payer: Managed Care, Other (non HMO) | Admitting: Anesthesiology

## 2014-10-23 ENCOUNTER — Encounter: Payer: Self-pay | Admitting: *Deleted

## 2014-10-23 DIAGNOSIS — Z79899 Other long term (current) drug therapy: Secondary | ICD-10-CM

## 2014-10-23 DIAGNOSIS — N359 Urethral stricture, unspecified: Secondary | ICD-10-CM | POA: Diagnosis present

## 2014-10-23 DIAGNOSIS — E860 Dehydration: Secondary | ICD-10-CM | POA: Diagnosis not present

## 2014-10-23 DIAGNOSIS — N390 Urinary tract infection, site not specified: Secondary | ICD-10-CM | POA: Diagnosis not present

## 2014-10-23 DIAGNOSIS — K6289 Other specified diseases of anus and rectum: Secondary | ICD-10-CM

## 2014-10-23 DIAGNOSIS — Z7982 Long term (current) use of aspirin: Secondary | ICD-10-CM

## 2014-10-23 DIAGNOSIS — N135 Crossing vessel and stricture of ureter without hydronephrosis: Secondary | ICD-10-CM

## 2014-10-23 DIAGNOSIS — I959 Hypotension, unspecified: Secondary | ICD-10-CM | POA: Diagnosis not present

## 2014-10-23 DIAGNOSIS — C20 Malignant neoplasm of rectum: Principal | ICD-10-CM | POA: Diagnosis present

## 2014-10-23 DIAGNOSIS — C19 Malignant neoplasm of rectosigmoid junction: Secondary | ICD-10-CM | POA: Diagnosis present

## 2014-10-23 HISTORY — PX: CYSTOSCOPY WITH STENT PLACEMENT: SHX5790

## 2014-10-23 HISTORY — PX: BOWEL RESECTION: SHX1257

## 2014-10-23 HISTORY — PX: LAPAROTOMY: SHX154

## 2014-10-23 HISTORY — PX: DIVERTING ILEOSTOMY: SHX5799

## 2014-10-23 LAB — CBC
HEMATOCRIT: 34.8 % — AB (ref 40.0–52.0)
HEMOGLOBIN: 10.7 g/dL — AB (ref 13.0–18.0)
MCH: 24.8 pg — AB (ref 26.0–34.0)
MCHC: 30.7 g/dL — AB (ref 32.0–36.0)
MCV: 80.6 fL (ref 80.0–100.0)
Platelets: 230 10*3/uL (ref 150–440)
RBC: 4.32 MIL/uL — ABNORMAL LOW (ref 4.40–5.90)
RDW: 19.1 % — ABNORMAL HIGH (ref 11.5–14.5)
WBC: 9.4 10*3/uL (ref 3.8–10.6)

## 2014-10-23 LAB — CREATININE, SERUM
Creatinine, Ser: 1.24 mg/dL (ref 0.61–1.24)
GFR calc Af Amer: >60 mL/min (ref >60)
GFR calc non Af Amer: >60 mL/min (ref >60)

## 2014-10-23 LAB — GLUCOSE, CAPILLARY: Glucose-Capillary: 89 mg/dL (ref 65–99)

## 2014-10-23 SURGERY — RESECTION, RECTUM, LOW ANTERIOR
Anesthesia: General | Wound class: Clean Contaminated

## 2014-10-23 MED ORDER — ALVIMOPAN 12 MG PO CAPS
ORAL_CAPSULE | ORAL | Status: AC
  Administered 2014-10-23: 12 mg via ORAL
  Filled 2014-10-23: qty 1

## 2014-10-23 MED ORDER — FENTANYL CITRATE (PF) 100 MCG/2ML IJ SOLN
INTRAMUSCULAR | Status: AC
  Administered 2014-10-23: 25 ug via INTRAVENOUS
  Filled 2014-10-23: qty 2

## 2014-10-23 MED ORDER — SODIUM CHLORIDE 0.9 % IJ SOLN
3.0000 mL | Freq: Two times a day (BID) | INTRAMUSCULAR | Status: DC
  Administered 2014-10-23 – 2014-10-30 (×12): 3 mL via INTRAVENOUS

## 2014-10-23 MED ORDER — SODIUM CHLORIDE 0.9 % IV SOLN
1.0000 g | Freq: Once | INTRAVENOUS | Status: AC
  Administered 2014-10-23: 1 g via INTRAVENOUS
  Filled 2014-10-23: qty 1

## 2014-10-23 MED ORDER — ALVIMOPAN 12 MG PO CAPS
12.0000 mg | ORAL_CAPSULE | Freq: Two times a day (BID) | ORAL | Status: DC
  Administered 2014-10-24 – 2014-10-27 (×8): 12 mg via ORAL
  Filled 2014-10-23 (×8): qty 1

## 2014-10-23 MED ORDER — CIPROFLOXACIN IN D5W 400 MG/200ML IV SOLN
400.0000 mg | Freq: Once | INTRAVENOUS | Status: AC
  Administered 2014-10-23: 400 mg via INTRAVENOUS

## 2014-10-23 MED ORDER — ASPIRIN EC 81 MG PO TBEC
81.0000 mg | DELAYED_RELEASE_TABLET | Freq: Every day | ORAL | Status: DC
  Administered 2014-10-23 – 2014-11-01 (×10): 81 mg via ORAL
  Filled 2014-10-23 (×10): qty 1

## 2014-10-23 MED ORDER — ALVIMOPAN 12 MG PO CAPS
12.0000 mg | ORAL_CAPSULE | Freq: Once | ORAL | Status: AC
  Administered 2014-10-23: 12 mg via ORAL

## 2014-10-23 MED ORDER — ACETAMINOPHEN 10 MG/ML IV SOLN
INTRAVENOUS | Status: AC
  Filled 2014-10-23: qty 100

## 2014-10-23 MED ORDER — CLONIDINE HCL 0.1 MG PO TABS
0.2000 mg | ORAL_TABLET | Freq: Every day | ORAL | Status: DC
  Administered 2014-10-24 – 2014-10-29 (×4): 0.2 mg via ORAL
  Filled 2014-10-23 (×6): qty 2

## 2014-10-23 MED ORDER — DIPHENHYDRAMINE HCL 12.5 MG/5ML PO ELIX
12.5000 mg | ORAL_SOLUTION | Freq: Four times a day (QID) | ORAL | Status: DC | PRN
Start: 2014-10-23 — End: 2014-10-28

## 2014-10-23 MED ORDER — FAMOTIDINE 20 MG PO TABS
20.0000 mg | ORAL_TABLET | Freq: Once | ORAL | Status: AC
  Administered 2014-10-23: 20 mg via ORAL

## 2014-10-23 MED ORDER — FENTANYL CITRATE (PF) 100 MCG/2ML IJ SOLN
25.0000 ug | INTRAMUSCULAR | Status: DC | PRN
  Administered 2014-10-23 (×4): 25 ug via INTRAVENOUS

## 2014-10-23 MED ORDER — ONDANSETRON HCL 4 MG/2ML IJ SOLN
INTRAMUSCULAR | Status: DC | PRN
  Administered 2014-10-23: 4 mg via INTRAVENOUS

## 2014-10-23 MED ORDER — PROPOFOL 10 MG/ML IV BOLUS
INTRAVENOUS | Status: DC | PRN
  Administered 2014-10-23: 150 mg via INTRAVENOUS

## 2014-10-23 MED ORDER — COLCHICINE 0.6 MG PO TABS
0.6000 mg | ORAL_TABLET | Freq: Every day | ORAL | Status: DC
  Administered 2014-10-24 – 2014-11-01 (×9): 0.6 mg via ORAL
  Filled 2014-10-23 (×9): qty 1

## 2014-10-23 MED ORDER — IOTHALAMATE MEGLUMINE 43 % IV SOLN
INTRAVENOUS | Status: DC | PRN
  Administered 2014-10-23: 20 mL

## 2014-10-23 MED ORDER — EPHEDRINE SULFATE 50 MG/ML IJ SOLN
INTRAMUSCULAR | Status: DC | PRN
  Administered 2014-10-23 (×5): 10 mg via INTRAVENOUS
  Administered 2014-10-23: 5 mg via INTRAVENOUS
  Administered 2014-10-23: 15 mg via INTRAVENOUS
  Administered 2014-10-23: 10 mg via INTRAVENOUS

## 2014-10-23 MED ORDER — CIPROFLOXACIN IN D5W 400 MG/200ML IV SOLN
INTRAVENOUS | Status: AC
  Administered 2014-10-23: 400 mg via INTRAVENOUS
  Filled 2014-10-23: qty 200

## 2014-10-23 MED ORDER — KCL IN DEXTROSE-NACL 20-5-0.45 MEQ/L-%-% IV SOLN
INTRAVENOUS | Status: DC
  Administered 2014-10-23 – 2014-10-31 (×16): via INTRAVENOUS
  Filled 2014-10-23 (×21): qty 1000

## 2014-10-23 MED ORDER — SODIUM CHLORIDE 0.9 % IR SOLN
Status: DC | PRN
  Administered 2014-10-23: 3000 mL via INTRAVESICAL

## 2014-10-23 MED ORDER — ACETAMINOPHEN 10 MG/ML IV SOLN
INTRAVENOUS | Status: DC | PRN
  Administered 2014-10-23: 1000 mg via INTRAVENOUS

## 2014-10-23 MED ORDER — DIPHENHYDRAMINE HCL 50 MG/ML IJ SOLN: 12.5000 mg | Freq: Four times a day (QID) | INTRAMUSCULAR | Status: DC | PRN

## 2014-10-23 MED ORDER — SODIUM CHLORIDE 0.9 % IV SOLN
INTRAVENOUS | Status: DC
  Administered 2014-10-23: 07:00:00 via INTRAVENOUS

## 2014-10-23 MED ORDER — LISINOPRIL 20 MG PO TABS
20.0000 mg | ORAL_TABLET | Freq: Every day | ORAL | Status: DC
  Administered 2014-10-23 – 2014-10-31 (×6): 20 mg via ORAL
  Filled 2014-10-23 (×9): qty 1

## 2014-10-23 MED ORDER — SODIUM CHLORIDE 0.9 % IJ SOLN: 9.0000 mL | INTRAMUSCULAR | Status: DC | PRN

## 2014-10-23 MED ORDER — FAMOTIDINE 20 MG PO TABS
ORAL_TABLET | ORAL | Status: AC
  Administered 2014-10-23: 20 mg via ORAL
  Filled 2014-10-23: qty 1

## 2014-10-23 MED ORDER — NALOXONE HCL 0.4 MG/ML IJ SOLN: 0.4000 mg | INTRAMUSCULAR | Status: DC | PRN

## 2014-10-23 MED ORDER — MIDAZOLAM HCL 5 MG/5ML IJ SOLN
INTRAMUSCULAR | Status: DC | PRN
  Administered 2014-10-23: 2 mg via INTRAVENOUS

## 2014-10-23 MED ORDER — ENOXAPARIN SODIUM 40 MG/0.4ML ~~LOC~~ SOLN
40.0000 mg | SUBCUTANEOUS | Status: DC
  Administered 2014-10-23 – 2014-10-31 (×9): 40 mg via SUBCUTANEOUS
  Filled 2014-10-23 (×9): qty 0.4

## 2014-10-23 MED ORDER — 0.9 % SODIUM CHLORIDE (POUR BTL) OPTIME
TOPICAL | Status: DC | PRN
  Administered 2014-10-23: 1500 mL

## 2014-10-23 MED ORDER — LIDOCAINE HCL (CARDIAC) 20 MG/ML IV SOLN
INTRAVENOUS | Status: DC | PRN
  Administered 2014-10-23: 80 mg via INTRAVENOUS

## 2014-10-23 MED ORDER — SODIUM CHLORIDE 0.9 % IV SOLN
INTRAVENOUS | Status: DC | PRN
  Administered 2014-10-23: 08:00:00 via INTRAVENOUS

## 2014-10-23 MED ORDER — ENOXAPARIN SODIUM 40 MG/0.4ML ~~LOC~~ SOLN: 40.0000 mg | SUBCUTANEOUS | Status: DC

## 2014-10-23 MED ORDER — PHENYLEPHRINE HCL 10 MG/ML IJ SOLN
INTRAMUSCULAR | Status: DC | PRN
  Administered 2014-10-23 (×3): 100 ug via INTRAVENOUS

## 2014-10-23 MED ORDER — ENOXAPARIN SODIUM 40 MG/0.4ML ~~LOC~~ SOLN
40.0000 mg | Freq: Once | SUBCUTANEOUS | Status: AC
  Administered 2014-10-23: 40 mg via SUBCUTANEOUS
  Filled 2014-10-23: qty 0.4

## 2014-10-23 MED ORDER — SUGAMMADEX SODIUM 200 MG/2ML IV SOLN
INTRAVENOUS | Status: DC | PRN
  Administered 2014-10-23: 175 mg via INTRAVENOUS

## 2014-10-23 MED ORDER — LISINOPRIL-HYDROCHLOROTHIAZIDE 20-25 MG PO TABS: 1.0000 | ORAL_TABLET | Freq: Every day | ORAL | Status: DC

## 2014-10-23 MED ORDER — OXYCODONE HCL 5 MG PO TABS: 5.0000 mg | ORAL_TABLET | Freq: Once | ORAL | Status: DC | PRN

## 2014-10-23 MED ORDER — ROCURONIUM BROMIDE 100 MG/10ML IV SOLN
INTRAVENOUS | Status: DC | PRN
  Administered 2014-10-23: 50 mg via INTRAVENOUS
  Administered 2014-10-23 (×3): 10 mg via INTRAVENOUS

## 2014-10-23 MED ORDER — MORPHINE SULFATE 1 MG/ML IV SOLN
INTRAVENOUS | Status: DC
  Administered 2014-10-24: via INTRAVENOUS
  Administered 2014-10-24: 14.74 mg via INTRAVENOUS
  Administered 2014-10-24: 4 mg via INTRAVENOUS
  Administered 2014-10-24: 23:00:00 via INTRAVENOUS
  Administered 2014-10-25: 6 mg via INTRAVENOUS
  Administered 2014-10-25: 1 mg via INTRAVENOUS
  Administered 2014-10-26: 2 mg via INTRAVENOUS
  Administered 2014-10-26: 1 mg via INTRAVENOUS
  Administered 2014-10-26: 0 mg via INTRAVENOUS
  Administered 2014-10-26: 3 mg via INTRAVENOUS
  Administered 2014-10-26 (×2): 1 mg via INTRAVENOUS
  Administered 2014-10-26: 2 mg via INTRAVENOUS
  Administered 2014-10-27: 0 mg via INTRAVENOUS
  Filled 2014-10-23 (×4): qty 25

## 2014-10-23 MED ORDER — MORPHINE SULFATE 1 MG/ML IV SOLN
INTRAVENOUS | Status: DC
  Administered 2014-10-23: 15:00:00 via INTRAVENOUS
  Administered 2014-10-23: 7 mg via INTRAVENOUS
  Filled 2014-10-23: qty 25

## 2014-10-23 MED ORDER — FENTANYL CITRATE (PF) 250 MCG/5ML IJ SOLN
INTRAMUSCULAR | Status: DC | PRN
  Administered 2014-10-23 (×2): 100 ug via INTRAVENOUS
  Administered 2014-10-23: 50 ug via INTRAVENOUS

## 2014-10-23 MED ORDER — METHYLENE BLUE 1 % INJ SOLN
INTRAMUSCULAR | Status: DC | PRN
  Administered 2014-10-23: 5 mL via INTRAVENOUS

## 2014-10-23 MED ORDER — ONDANSETRON HCL 4 MG/2ML IJ SOLN
4.0000 mg | Freq: Four times a day (QID) | INTRAMUSCULAR | Status: DC | PRN
  Administered 2014-10-27: 4 mg via INTRAVENOUS
  Filled 2014-10-23: qty 2

## 2014-10-23 MED ORDER — OXYCODONE HCL 5 MG/5ML PO SOLN: 5.0000 mg | Freq: Once | ORAL | Status: DC | PRN

## 2014-10-23 MED ORDER — METHYLENE BLUE 1 % INJ SOLN
INTRAMUSCULAR | Status: AC
  Filled 2014-10-23: qty 10

## 2014-10-23 MED ORDER — HYDROCHLOROTHIAZIDE 25 MG PO TABS
25.0000 mg | ORAL_TABLET | Freq: Every day | ORAL | Status: DC
Start: 2014-10-23 — End: 2014-11-01
  Administered 2014-10-24 – 2014-10-31 (×5): 25 mg via ORAL
  Filled 2014-10-23 (×8): qty 1

## 2014-10-23 SURGICAL SUPPLY — 76 items
BAG BILE T-TUBES STRL (MISCELLANEOUS) ×8 IMPLANT
BAG DRAIN CYSTO-URO LG1000N (MISCELLANEOUS) ×4 IMPLANT
BAG URO DRAIN 2000ML W/SPOUT (MISCELLANEOUS) ×4 IMPLANT
CATH SET URETHRAL DILATOR (CATHETERS) ×4 IMPLANT
CATH TORCON 5FR 0.38 (CATHETERS) ×4 IMPLANT
CATH TRAY 16F METER LATEX (MISCELLANEOUS) IMPLANT
CATH URETL 5X70 OPEN END (CATHETERS) ×8 IMPLANT
CHLORAPREP W/TINT 26ML (MISCELLANEOUS) ×4 IMPLANT
CLOSURE WOUND 1/2 X4 (GAUZE/BANDAGES/DRESSINGS)
CONRAY 43 FOR UROLOGY 50M (MISCELLANEOUS) ×4 IMPLANT
DRAIN PENROSE 1/4X12 LTX (DRAIN) ×8 IMPLANT
DRAPE LAPAROTOMY 100X77 ABD (DRAPES) ×4 IMPLANT
DRAPE SHEET LG 3/4 BI-LAMINATE (DRAPES) IMPLANT
DRAPE UTILITY 15X26 TOWEL STRL (DRAPES) ×8 IMPLANT
DRSG OPSITE POSTOP 4X10 (GAUZE/BANDAGES/DRESSINGS) IMPLANT
DRSG OPSITE POSTOP 4X12 (GAUZE/BANDAGES/DRESSINGS) ×4 IMPLANT
DRSG OPSITE POSTOP 4X8 (GAUZE/BANDAGES/DRESSINGS) IMPLANT
ELECT CAUTERY BLADE 6.4 (BLADE) ×4 IMPLANT
GAUZE PETRO XEROFOAM 1X8 (MISCELLANEOUS) IMPLANT
GAUZE SPONGE 4X4 12PLY STRL (GAUZE/BANDAGES/DRESSINGS) ×4 IMPLANT
GLIDEWIRE STIFF .35X180X3 HYDR (WIRE) ×4 IMPLANT
GLOVE BIO SURGEON STRL SZ 6.5 (GLOVE) ×15 IMPLANT
GLOVE BIO SURGEON STRL SZ7 (GLOVE) ×8 IMPLANT
GLOVE BIO SURGEON STRL SZ7.5 (GLOVE) ×56 IMPLANT
GLOVE BIO SURGEONS STRL SZ 6.5 (GLOVE) ×5
GOWN STRL REUS W/ TWL LRG LVL3 (GOWN DISPOSABLE) ×12 IMPLANT
GOWN STRL REUS W/ TWL XL LVL3 (GOWN DISPOSABLE) ×6 IMPLANT
GOWN STRL REUS W/TWL LRG LVL3 (GOWN DISPOSABLE) ×12
GOWN STRL REUS W/TWL XL LVL3 (GOWN DISPOSABLE) ×6
JELLY LUB 2OZ STRL (MISCELLANEOUS) ×2
JELLY LUBE 2OZ STRL (MISCELLANEOUS) ×2 IMPLANT
KIT RM TURNOVER CYSTO AR (KITS) ×4 IMPLANT
KIT RM TURNOVER STRD PROC AR (KITS) ×4 IMPLANT
LABEL OR SOLS (LABEL) ×4 IMPLANT
LIGASURE 5MM LAPAROSCOPIC (INSTRUMENTS) ×4 IMPLANT
LOOP OSTOMY BRIDGE (OSTOMY) ×4 IMPLANT
NS IRRIG 1000ML POUR BTL (IV SOLUTION) ×4 IMPLANT
PACK BASIN MAJOR ARMC (MISCELLANEOUS) ×4 IMPLANT
PACK COLON CLEAN CLOSURE (MISCELLANEOUS) ×4 IMPLANT
PACK CYSTO AR (MISCELLANEOUS) ×4 IMPLANT
PAD GROUND ADULT SPLIT (MISCELLANEOUS) ×4 IMPLANT
POUCH DRAIN 1 3/4 SMALL GREEN (OSTOMY) ×4 IMPLANT
PREP PVP WINGED SPONGE (MISCELLANEOUS) ×4 IMPLANT
SENSORWIRE 0.038 NOT ANGLED (WIRE) ×4
SET CYSTO W/LG BORE CLAMP LF (SET/KITS/TRAYS/PACK) ×4 IMPLANT
SOL .9 NS 3000ML IRR  AL (IV SOLUTION) ×2
SOL .9 NS 3000ML IRR UROMATIC (IV SOLUTION) ×2 IMPLANT
SPONGE LAP 18X18 5 PK (GAUZE/BANDAGES/DRESSINGS) ×8 IMPLANT
STAPLER CUT CVD 40MM GREEN (STAPLE) ×4 IMPLANT
STAPLER CUT RELOAD GREEN (STAPLE) ×4 IMPLANT
STAPLER PROX 25M (MISCELLANEOUS) ×4 IMPLANT
STAPLER SKIN PROX 35W (STAPLE) ×4 IMPLANT
STENT URET 6FRX24 CONTOUR (STENTS) IMPLANT
STENT URET 6FRX26 CONTOUR (STENTS) ×4 IMPLANT
STRIP CLOSURE SKIN 1/2X4 (GAUZE/BANDAGES/DRESSINGS) IMPLANT
SUT CHROMIC 3 0 SH 27 (SUTURE) ×16 IMPLANT
SUT PDS 2-0 27IN (SUTURE) ×4 IMPLANT
SUT PDS AB 1 TP1 96 (SUTURE) ×8 IMPLANT
SUT SILK 3-0 (SUTURE) ×2
SUT SILK 3-0 SH-1 18XCR BRD (SUTURE) ×2
SUT TICRON 2-0 30IN 311381 (SUTURE) IMPLANT
SUT VIC AB 1 CTX 27 (SUTURE) ×4 IMPLANT
SUT VIC AB 3-0 SH 27 (SUTURE) ×4
SUT VIC AB 3-0 SH 27X BRD (SUTURE) ×4 IMPLANT
SUT VICRYL 1 TIES 12X18 (SUTURE) IMPLANT
SUT VICRYL PLUS ABS 0 54 (SUTURE) ×8 IMPLANT
SUTURE SILK 3-0 SH-1 18XCR BRD (SUTURE) ×2 IMPLANT
SYR 30ML LL (SYRINGE) ×4 IMPLANT
SYRINGE IRR TOOMEY STRL 70CC (SYRINGE) ×8 IMPLANT
TORCON NB ADVANTAGE CATHETER ×4 IMPLANT
TUBING CONNECTING 10 (TUBING) ×3 IMPLANT
TUBING CONNECTING 10' (TUBING) ×1
TUBING PRES M/F 48IN 4237111 (TUBING) ×8 IMPLANT
WAFER FLANGE 1 3/4 SMALL GREEN (OSTOMY) ×4 IMPLANT
WATER STERILE IRR 1000ML POUR (IV SOLUTION) ×4 IMPLANT
WIRE SENSOR 0.038 NOT ANGLED (WIRE) ×2 IMPLANT

## 2014-10-23 NOTE — Op Note (Signed)
Date of procedure: 10/23/2014  Preoperative diagnosis:  1. Rectal cancer  Postoperative diagnosis:  1. Same as above 2. Bulbar urethral stricture 3. Left distal ureter stenosis  Procedure: 1. Cystoscopy 2. Urethral dilation 3. Bilateral ureteral stent placement 4. Left retrograde pyelogram 5. Left ureteroscopy  Surgeon: Hollice Espy, MD  Anesthesia: General  Complications: None  Intraoperative findings: Approximately 10 French bulbar urethral stricture requiring dilation noted. Friable and erythematous bladder with bullous edema especially on left lateral wall of the bladder and trigone. Left UO somewhat distorted. Left retrograde pyelogram showed significant distal narrowing and J hooking of the left distal ureter. Direct visualization of the distal ureter revealed extrinsic compression and significant tortuosity of the left distal ureter.  EBL: Minimal  Specimens: None  Drains: 5 French left open-ended ureteral stent, 6 x 26 French double-J right ureteral stent, 16 French Foley catheter  Indication: [name] [name] [name] is a 59 y.o. patient with history of rectal cancer undergoing low anterior resection today by Dr. Felton Clinton. I have been asked to place preoperative ureteral stents for assistance with identification of the distal ureters.  Of note, the patient did have a urinary tract a few weeks ago which was treated with Cipro (Escherichia coli) in follow-up urine culture 6 days ago was negative although UA still somewhat suspicious.  After reviewing the management options for treatment, he elected to proceed with the above surgical procedure(s). We have discussed the potential benefits and risks of the procedure, side effects of the proposed treatment, the likelihood of the patient achieving the goals of the procedure, and any potential problems that might occur during the procedure or recuperation. Informed consent has been obtained.  Description of procedure:  The patient was  taken to the operating room and general anesthesia was induced.  The patient was placed in the dorsal lithotomy position, prepped and draped in the usual sterile fashion, and preoperative antibiotics were administered. A preoperative time-out was performed.   At this point time, I attempted to advance a 19 French rigid cystoscope per urethra into the bladder, however, encountered a fairly dense appearing bulbar urethral stricture approximately 10 French in diameter. I was unable to pass the scope beyond this safely. Under direct vision, a sensor wire was advanced into the bladder and position confirmed with the C-arm. I then performed serial urethral dilations starting with an 8 [place] dilator up to 18 [place]. The scope was then reintroduced and I was ultimately able to advance the scope into the bladder. The prostate was not particularly enlarged but the pelvis did feel somewhat fixed. Upon inspection of the bladder, significant friable mucosa was noted with some areas of bullous edema particularly on the left lateral wall and left hemitrigone. There were no obvious papillary lesions but the bladder was somewhat suspicious either for inflammation from a recent urinary tract infection or possibly even involvement of adenocarcinoma. Dr. Felton Clinton was made aware of these findings. Attention was then turned to the right ureteral orifice which was cannulated without difficulty using a 5 [place] open-ended ureteral catheter and a sensor wire up to the kidney. The 5 [place] was advanced to the proximal ureter where was left in place and the scope was removed. The scope was then reintroduced alongside of this ureteral catheter back into the bladder where attention was turned to the left UO which was somewhat difficult to identify given the irregular bladder contour on this side. I attempted to cannulate the UO using both a sensor and angled Glidewire however was  unable to safely advance the wire without meeting resistance. At  this point, a 5 [place] open-ended ureteral catheter was advanced just within the UO and a retrograde pyelogram was performed. This revealed some narrowing and tortuosity of the very distal left ureter concerning for either involvement of tumor or possible ureteral stricture from his previous radiation. Contrast did pass beyond this area without any significant proximal ureteral dilation although the collecting system was mildly full appearing. There were no obvious filling defects. I then attempted several other methods for introducing a wire up to the kidney including use of a Kumpe catheter amongst other wires and catheters without success. Ultimately, a rigid ureteroscope was brought in and advanced to the distal ureter where a fold and bend in the ureter was able to be visualized. Under direct visualization, a angled Glidewire was able to be advanced and ultimately the scope over the wire beyond this area. At this point, the wire was able to pass all the way up to the level of the kidney where retained contrast in the previous retrograde was seen. The ureteroscope was advanced all the way up to the level of the iliacs and there were no intraluminal masses identified. The narrowing appeared to be extrinsic. Finally, the ureteroscope was removed and the wire was backloaded over a rigid cystoscope. A 6 x 26 French double-J ureteral stent was advanced over the wire up to the kidney. The wire was partially withdrawn and the proximal coil of the stent was seen hooked around the upper pole calyx. The wire was fully withdrawn and a coil was noted within the bladder. The bladder was then drained. A 16 French Foley catheter was then placed sterilely alongside of the left open-ended ureteral stent. The balloon was inflated with 10 cc of sterile water. The stent was tied to the Foley using silk ties 2. IV extension tubing and a bile bag was used to drain the left ureteral stent. The patient was then cleaned and dried and  the remainder of the procedure was performed by Dr. Felton Clinton and his colleagues. All findings were discussed with the general surgeons were aware.  Plan:  Maintain Foley catheter 1 week given history of difficult Foley and need for urethral dilation Right ureteral stent may be removed at the end of the case Patient will need to follow-up in urology clinic in a few weeks for cystoscopy, left ureteral stent removal Urology available today as needed if bladder, ureteral involvement suspected  Hollice Espy, M.D.

## 2014-10-23 NOTE — Op Note (Signed)
10/23/2014  12:40 PM  PATIENT:  [name] Kehr [name]  59 y.o. male  PRE-OPERATIVE DIAGNOSIS:  RECTAL CANCER  POST-OPERATIVE DIAGNOSIS:  RECTAL CANCER  PROCEDURE:  Procedure(s): LOW ANTERIOR BOWEL RESECTION (N/A) CYSTOSCOPY WITH STENT PLACEMENT,URETHRAL DILATION, LEFT RETROGRADE PYELOGRAM, URETEROSCOPY (Bilateral) EXPLORATORY LAPAROTOMY (N/A) DIVERTING ILEOSTOMY (N/A)  SURGEON:  Surgeon(s) and Role: Panel 1:    * Sherri Rad, MD - Primary    * Dia Crawford III, MD - Assisting  Panel 2:    * Hollice Espy, MD - Primary  ANESTHESIA: Gen. oroendotracheal  SPECIMEN: Rectum and distal sigmoid    EBL: 300 cc  Description of procedure:    With informed consent supine position and general anesthesia the patient had a cystoscopy with double-J ureteral stent and temporary right ureteral catheter placed by Dr. Erlene Quan of urology. The patient's perineum was prepped and draped with Betadine the abdomen clipped of hair and the prepped with Choloroprep solution and a time out was observed.   Primary midline skin incision was fashion from above the umbilicus with skin incision to the right of the umbilicus carried down through skin and subcutaneous tissues through muscular fascial layers to the pubic symphysis. Self-retaining abdominal retractor was placed.  Palpation of the liver demonstrated no evidence of abnormalities. Stomach was unremarkable. Oral gastric tube remained in place during the case. Small bowel was run in its entirety and found to be unremarkable. Palpation of the pelvis demonstrated no obvious mass. There was some thickening of the distal rectum.   The distal sigmoid colon was divided with a single fire of the contoured 40 mm stapler. The intervening mesentery and sigmoidal branches were divided utilizing the LigaSure apparatus and ties of number 000 Vicryls suture.  The mesorectum was then excised in a posterior to anterior fashion utilizing electrocautery as well as sharp  dissection. The course of the right and left ureter were identified with palpation of the previously placed stent. Anteriorly dissection was taken off of the bladder in the avascular plane. Lateral stalks divided with ligasure apparatus.  Rigid proctoscopy was then performed demonstrating the ulceration site below the intended area of division. The mesorectum in this area was divided allowing for staple replaced.  Frozen section demonstrated negative margin.  Methylene blue was given and no dye noted on lap tape placed in pelvis while waiting frozen section.  The proximal colon was then cleaned of epiploic fat. Pursestring suture of #2-0 PDS was placed. The proximal colon accepted a 25 mm dilator. 25 mm ILS stapler was then introduced onto the field and the anvil was secured. Pursestring was applied. Dr. Pat Patrick then inserted the stapler per rectum and the stapler was opened and the coupling mechanism was engaged. The stapler was closed. The stapler was fired. 2 intact stapled donuts were identified and submitted to pathology separately.   Saline insufflation test was performed demonstrate no evidence of leak. A diverting loop ileostomy was created 25 cm proximal to the ileocecal valve. Cruciate incision was fashioned in the skin and cruciate incision in the anterior fascia with muscle-splitting technique. The ileostomy was brought up to the site and secured to the anterior fascia with several #3-0 silk sutures.   With lap and needle count correct 2 and a change of operating room gown and gloves and back table the fascia was reapproximated from the extremes of the wound utilizing running looped #1 PDS suture. Penrose drain was placed in subcutaneous niece tissues skin was reapproximated with skin stapler. Sterile dressing was applied.  Ileostomy bar was placed. The ileostomy was matured in Brooke-type fashion and utilizing #3-0 chromic suture.  Ileostomy appliance was placed. The patient was insufflated  returned supine and the right ureteral temporary stent was removed Foley catheter was left in place and he was transported in stable and satisfactory condition by anesthesia services.

## 2014-10-23 NOTE — Interval H&P Note (Signed)
History and Physical Interval Note:  10/23/2014 7:11 AM  [name] Kehr [name]  has presented today for surgery, with the diagnosis of RECTAL CANCER  The various methods of treatment have been discussed with the patient and family. After consideration of risks, benefits and other options for treatment, the patient has consented to  Procedure(s): LOW ANTERIOR BOWEL RESECTION (N/A) CYSTOSCOPY WITH STENT PLACEMENT (Bilateral) as a surgical intervention .  The patient's history has been reviewed, patient examined, no change in status, stable for surgery.  I have reviewed the patient's chart and labs.  Questions were answered to the patient's satisfaction.     Sherri Rad

## 2014-10-23 NOTE — H&P (View-Only) (Signed)
Subjective:     Patient ID: [name] Kehr [name], male   DOB: [birth year], 59 y.o.   MRN: [id number]  HPI  59 year old male who is now completed chemoradiation therapy on April 25. He is here for his preoperative visit. He is doing well. His main complaint is that of urinary frequency. He was treated for urinary tract infection by the oncology center with Cipro. He is had no further rectal bleeding. He is accompanied today by his brother. Today's office visit was primarily for the consultation purposes of discussion surgery.  Review of Systems  Constitutional: Positive for fatigue. Negative for fever, chills and unexpected weight change.  HENT: Negative.   Respiratory: Negative.   Cardiovascular: Negative for chest pain and leg swelling.  Genitourinary: Positive for dysuria, urgency, frequency and difficulty urinating. Negative for hematuria, flank pain, discharge, scrotal swelling, penile pain and testicular pain.  Allergic/Immunologic: Negative.   Neurological: Negative.   Hematological: Negative.   Psychiatric/Behavioral: Negative.   All other systems reviewed and are negative.      Objective:   Physical Exam  Constitutional: He is oriented to person, place, and time. He appears well-developed and well-nourished. No distress.  HENT:  Head: Normocephalic and atraumatic.  Eyes: Conjunctivae are normal. Pupils are equal, round, and reactive to light.  Neck: Normal range of motion. Neck supple.  Cardiovascular: Normal rate and regular rhythm.   Pulmonary/Chest: Effort normal and breath sounds normal. No respiratory distress.  Abdominal: Soft.  Genitourinary:  I can feel the tip of the rectal mass at the end of my examining finger approximately 9 cm from the anal verge.  Musculoskeletal: He exhibits no edema.  Neurological: He is alert and oriented to person, place, and time.  Skin: Skin is warm and dry.  Psychiatric: He has a normal mood and affect. His behavior is normal. Judgment  and thought content normal.       Assessment:     59 year-old male with stage IIA rectal adenocarcinoma by clinical and staging examination who has now completed chemoradiation therapy preoperatively. I discussed with him and his brother again in the office attempted low anterior resection with diverting loop ileostomy the risks of surgery including that of bleeding infection ureteral injury possibility of an APR and the expected postoperative recovery time.    Plan:     I discussed with him at length in excess of 60 minutes and in addition reviewed notes from the oncology center.  Plan is for a low anterior resection versus abdominal perineal resection with or without diverting loop ileostomy on October 23, 2014.   Plan is cystoscopy and bilateral ureteral stent placement with Dr Erlene Quan of Urology.

## 2014-10-23 NOTE — Anesthesia Postprocedure Evaluation (Signed)
  Anesthesia Post-op Note  Patient: [name] ROSIAK [name]  Procedure(s) Performed: Procedure(s): LOW ANTERIOR BOWEL RESECTION (N/A) CYSTOSCOPY WITH STENT PLACEMENT,URETHRAL DILATION, LEFT RETROGRADE PYELOGRAM, URETEROSCOPY (Bilateral) EXPLORATORY LAPAROTOMY (N/A) DIVERTING ILEOSTOMY (N/A)  Anesthesia type:General ETT  Patient location: PACU  Post pain: Pain level controlled  Post assessment: Post-op Vital signs reviewed, Patient's Cardiovascular Status Stable, Respiratory Function Stable, Patent Airway and No signs of Nausea or vomiting  Post vital signs: Reviewed and stable  Last Vitals:  Filed Vitals:   10/23/14 1417  BP: 150/84  Pulse: 60  Temp:   Resp: 0    Level of consciousness: awake, alert  and patient cooperative  Complications: No apparent anesthesia complications

## 2014-10-23 NOTE — Transfer of Care (Signed)
Immediate Anesthesia Transfer of Care Note  Patient: [name] DERY [name]  Procedure(s) Performed: Procedure(s): LOW ANTERIOR BOWEL RESECTION (N/A) CYSTOSCOPY WITH STENT PLACEMENT,URETHRAL DILATION, LEFT RETROGRADE PYELOGRAM, URETEROSCOPY (Bilateral) EXPLORATORY LAPAROTOMY (N/A) DIVERTING ILEOSTOMY (N/A)  Patient Location: PACU  Anesthesia Type:General  Level of Consciousness: sedated  Airway & Oxygen Therapy: Patient Spontanous Breathing and Patient connected to face mask oxygen  Post-op Assessment: Report given to RN and Post -op Vital signs reviewed and stable  Post vital signs: Reviewed and stable  Last Vitals: 137/68, 100% sat , 19r , 70hr , 96 temp Filed Vitals:   10/23/14 0621  BP: 132/83  Pulse: 55  Temp: 36.9 C  Resp: 16    Complications: No apparent anesthesia complications

## 2014-10-23 NOTE — Anesthesia Preprocedure Evaluation (Signed)
Anesthesia Evaluation  Patient identified by MRN, date of birth, ID band Patient awake    Reviewed: Allergy & Precautions, H&P , NPO status , Patient's Chart, lab work & pertinent test results  History of Anesthesia Complications Negative for: history of anesthetic complications  Airway Mallampati: II  TM Distance: >3 FB Neck ROM: full    Dental no notable dental hx. (+) Upper Dentures   Pulmonary shortness of breath and with exertion, Current Smoker,  breath sounds clear to auscultation  Pulmonary exam normal       Cardiovascular hypertension, + CAD, + Past MI, + Cardiac Stents, + Peripheral Vascular Disease and +CHF Normal cardiovascular examRhythm:regular Rate:Normal     Neuro/Psych negative neurological ROS  negative psych ROS   GI/Hepatic negative GI ROS, Neg liver ROS,   Endo/Other  diabetes  Renal/GU      Musculoskeletal   Abdominal   Peds  Hematology negative hematology ROS (+)   Anesthesia Other Findings Positive catheretization with no intervention 2/2 planned surgery  Reproductive/Obstetrics negative OB ROS                             Anesthesia Physical Anesthesia Plan  ASA: IV  Anesthesia Plan: General ETT   Post-op Pain Management:    Induction:   Airway Management Planned:   Additional Equipment:   Intra-op Plan:   Post-operative Plan:   Informed Consent: I have reviewed the patients History and Physical, chart, labs and discussed the procedure including the risks, benefits and alternatives for the proposed anesthesia with the patient or authorized representative who has indicated his/her understanding and acceptance.   Dental Advisory Given  Plan Discussed with: Anesthesiologist, CRNA and Surgeon  Anesthesia Plan Comments:         Anesthesia Quick Evaluation

## 2014-10-23 NOTE — Anesthesia Procedure Notes (Signed)
Procedure Name: Intubation Date/Time: 10/23/2014 7:42 AM Performed by: Delaney Meigs Pre-anesthesia Checklist: Patient identified, Emergency Drugs available, Suction available, Patient being monitored and Timeout performed Patient Re-evaluated:Patient Re-evaluated prior to inductionOxygen Delivery Method: Circle system utilized Preoxygenation: Pre-oxygenation with 100% oxygen Intubation Type: IV induction Ventilation: Mask ventilation without difficulty Laryngoscope Size: Mac and 3 Grade View: Grade I Tube type: Oral Number of attempts: 1 Airway Equipment and Method: Stylet Placement Confirmation: ETT inserted through vocal cords under direct vision,  positive ETCO2 and breath sounds checked- equal and bilateral Secured at: 22 cm Tube secured with: Tape Dental Injury: Teeth and Oropharynx as per pre-operative assessment

## 2014-10-24 ENCOUNTER — Ambulatory Visit: Payer: Managed Care, Other (non HMO) | Admitting: Hematology and Oncology

## 2014-10-24 DIAGNOSIS — N390 Urinary tract infection, site not specified: Secondary | ICD-10-CM

## 2014-10-24 LAB — CBC
HCT: 32.7 % — ABNORMAL LOW (ref 40.0–52.0)
Hemoglobin: 10 g/dL — ABNORMAL LOW (ref 13.0–18.0)
MCH: 24.7 pg — AB (ref 26.0–34.0)
MCHC: 30.6 g/dL — ABNORMAL LOW (ref 32.0–36.0)
MCV: 80.6 fL (ref 80.0–100.0)
Platelets: 231 10*3/uL (ref 150–440)
RBC: 4.06 MIL/uL — AB (ref 4.40–5.90)
RDW: 18.5 % — AB (ref 11.5–14.5)
WBC: 8.2 10*3/uL (ref 3.8–10.6)

## 2014-10-24 LAB — COMPREHENSIVE METABOLIC PANEL
ALT: 12 U/L — ABNORMAL LOW (ref 17–63)
AST: 15 U/L (ref 15–41)
Albumin: 3.1 g/dL — ABNORMAL LOW (ref 3.5–5.0)
Alkaline Phosphatase: 62 U/L (ref 38–126)
Anion gap: 6 (ref 5–15)
BILIRUBIN TOTAL: 0.3 mg/dL (ref 0.3–1.2)
BUN: 15 mg/dL (ref 6–20)
CO2: 23 mmol/L (ref 22–32)
Calcium: 8.4 mg/dL — ABNORMAL LOW (ref 8.9–10.3)
Chloride: 111 mmol/L (ref 101–111)
Creatinine, Ser: 1.25 mg/dL — ABNORMAL HIGH (ref 0.61–1.24)
GLUCOSE: 138 mg/dL — AB (ref 65–99)
Potassium: 4.1 mmol/L (ref 3.5–5.1)
Sodium: 140 mmol/L (ref 135–145)
Total Protein: 5.6 g/dL — ABNORMAL LOW (ref 6.5–8.1)

## 2014-10-24 LAB — SURGICAL PATHOLOGY

## 2014-10-24 NOTE — Progress Notes (Signed)
Urology Consult Follow Up  Subjective: Foley in place.  Disused intraop findings with patient and wife.  Patient reports is a former patient of Dr. Ernst Spell and used to require serial urethral dilation for urethral stricture.  He does note that he has been having significant urinary symptoms prior to admission.    Anti-infectives: Anti-infectives    Start     Dose/Rate Route Frequency Ordered Stop   10/23/14 0245  ciprofloxacin (CIPRO) IVPB 400 mg     400 mg 200 mL/hr over 60 Minutes Intravenous  Once 10/23/14 0234 10/23/14 0817   10/23/14 0245  ertapenem (INVANZ) 1 g in sodium chloride 0.9 % 50 mL IVPB     1 g 100 mL/hr over 30 Minutes Intravenous  Once 10/23/14 0234 10/23/14 0752      Current Facility-Administered Medications  Medication Dose Route Frequency Provider Last Rate Last Dose  . alvimopan (ENTEREG) capsule 12 mg  12 mg Oral BID Sherri Rad, MD      . aspirin EC tablet 81 mg  81 mg Oral Daily Sherri Rad, MD   81 mg at 10/23/14 1519  . cloNIDine (CATAPRES) tablet 0.2 mg  0.2 mg Oral Daily Sherri Rad, MD      . colchicine tablet 0.6 mg  0.6 mg Oral Daily Sherri Rad, MD   0.6 mg at 10/23/14 1501  . dextrose 5 % and 0.45 % NaCl with KCl 20 mEq/L infusion   Intravenous Continuous Sherri Rad, MD 125 mL/hr at 10/24/14 0006    . diphenhydrAMINE (BENADRYL) injection 12.5 mg  12.5 mg Intravenous Q6H PRN Sherri Rad, MD       Or  . diphenhydrAMINE (BENADRYL) 12.5 MG/5ML elixir 12.5 mg  12.5 mg Oral Q6H PRN Sherri Rad, MD      . enoxaparin (LOVENOX) injection 40 mg  40 mg Subcutaneous Q24H Sherri Rad, MD   40 mg at 10/23/14 2034  . lisinopril (PRINIVIL,ZESTRIL) tablet 20 mg  20 mg Oral Daily Sherri Rad, MD   20 mg at 10/23/14 1519   And  . hydrochlorothiazide (HYDRODIURIL) tablet 25 mg  25 mg Oral Daily Sherri Rad, MD   25 mg at 10/23/14 1501  . morphine 1 MG/ML PCA injection   Intravenous Continuous Dia Crawford III, MD      . naloxone [facility]) injection 0.4 mg  0.4 mg Intravenous PRN Sherri Rad, MD        And  . sodium chloride 0.9 % injection 9 mL  9 mL Intravenous PRN Sherri Rad, MD      . ondansetron [facility]) injection 4 mg  4 mg Intravenous Q6H PRN Sherri Rad, MD      . sodium chloride 0.9 % injection 3 mL  3 mL Intravenous Q12H Sherri Rad, MD   3 mL at 10/23/14 2034   Facility-Administered Medications Ordered in Other Encounters  Medication Dose Route Frequency Provider Last Rate Last Dose  . sodium chloride 0.9 % injection 10 mL  10 mL Intravenous PRN Dallas Schimke, MD   10 mL at 08/26/14 1300     Objective: Vital signs in last 24 hours: Temp:  [95.9 F (35.5 C)-98.8 F (37.1 C)] 98.8 F (37.1 C) (07/07 0755) Pulse Rate:  [54-84] 77 (07/07 0755) Resp:  [0-24] 16 (07/07 0755) BP: (119-173)/(71-90) 119/71 mmHg (07/07 0755) SpO2:  [94 %-100 %] 94 % (07/07 0755) Arterial Line BP: (132-150)/(67-73) 150/73 mmHg (07/06 1344) FiO2 (%):  [97 %] 97 % (07/07 0006) Weight:  [210 lb 1.6  oz (95.3 kg)] 210 lb 1.6 oz (95.3 kg) (07/07 0429)  Intake/Output from previous day: 07/06 0701 - 07/07 0700 In: 4375.6 [I.V.:4375.6] Out: 1725 [Urine:1425; Blood:300] Intake/Output this shift:     Physical Exam  Constitutional: He is oriented to person, place, and time and well-developed, well-nourished, and in no distress.  HENT:  Head: Normocephalic and atraumatic.  Neck: Normal range of motion. Neck supple.  Pulmonary/Chest: Effort normal and breath sounds normal.  Abdominal: Soft. He exhibits no distension.  Wounds with dressings in place  Genitourinary:  Foley draining cranberry colored urine, no clots.  Neurological: He is alert and oriented to person, place, and time.  Skin: Skin is warm and dry.    Lab Results:   Recent Labs  10/23/14 1539 10/24/14 0500  WBC 9.4 8.2  HGB 10.7* 10.0*  HCT 34.8* 32.7*  PLT 230 231   BMET  Recent Labs  10/23/14 1539 10/24/14 0500  NA  --  140  K  --  4.1  CL  --  111  CO2  --  23  GLUCOSE  --  138*  BUN  --  15  CREATININE 1.24  1.25*  CALCIUM  --  8.4*   Studies/Results: Dg C-arm 1-60 Min  10/23/2014   CLINICAL DATA:  Ureteral stent.  EXAM: DG C-ARM 61-120 MIN  COMPARISON:  None.  FINDINGS: Images from bilateral ureteral stent placement. Proximal tips peer projected over the kidneys. Contrast is in the left renal collecting system.  IMPRESSION: Bilateral ureteral stent placement.   Electronically Signed   By: Marcello Moores  Register   On: 10/23/2014 09:09     Assessment: POD1 s/p Procedure(s): LOW ANTERIOR BOWEL RESECTION CYSTOSCOPY WITH STENT PLACEMENT,URETHRAL DILATION, LEFT RETROGRADE PYELOGRAM, URETEROSCOPY EXPLORATORY LAPAROTOMY DIVERTING ILEOSTOMY  Plan: 1) maintain Foley s/p urethral dilation x 1 week 2) patient will need outpatient follow up for cysto, left stent removal 3) findings discussed with patient  Please call with any questions or concerns.     LOS: 1 day    [name] [name] 10/24/2014

## 2014-10-24 NOTE — Progress Notes (Signed)
1 Day Post-Op   Subjective:  His pain control is markedly improved with low-grade continuous morphine. He is intermittently using his PCA. He was able to sleep good portion of last night. He denies any nausea. He has not been burping. He has no ostomy output other than some serous fluid. His laboratory values are reasonable but overall he appears to be progressing normally.  Vital signs in last 24 hours: Temp:  [95.9 F (35.5 C)-98.7 F (37.1 C)] 98.7 F (37.1 C) (07/07 0409) Pulse Rate:  [54-104] 79 (07/07 0409) Resp:  [0-24] 16 (07/07 0409) BP: (126-173)/(74-90) 134/76 mmHg (07/07 0409) SpO2:  [96 %-100 %] 96 % (07/07 0409) Arterial Line BP: (132-150)/(67-73) 150/73 mmHg (07/06 1344) FiO2 (%):  [97 %] 97 % (07/07 0006) Weight:  [95.3 kg (210 lb 1.6 oz)] 95.3 kg (210 lb 1.6 oz) (07/07 0429)    Intake/Output from previous day: 07/06 0701 - 07/07 0700 In: 4375.6 [I.V.:4375.6] Out: 1725 [Urine:1425; Blood:300]  GI: His abdomen is soft with moderate drainage on the honeycomb dressing. The ostomy is pink slightly edematous with no function as yet.  Lab Results:  CBC  Recent Labs  10/23/14 1539 10/24/14 0500  WBC 9.4 8.2  HGB 10.7* 10.0*  HCT 34.8* 32.7*  PLT 230 231   CMP     Component Value Date/Time   NA 140 10/24/2014 0500   NA 132* 08/12/2014 0858   K 4.1 10/24/2014 0500   K 4.0 08/12/2014 0858   CL 111 10/24/2014 0500   CL 100* 08/12/2014 0858   CO2 23 10/24/2014 0500   CO2 24 08/12/2014 0858   GLUCOSE 138* 10/24/2014 0500   GLUCOSE 118* 08/12/2014 0858   BUN 15 10/24/2014 0500   BUN 30* 08/12/2014 0858   CREATININE 1.25* 10/24/2014 0500   CREATININE 1.52* 08/12/2014 0858   CALCIUM 8.4* 10/24/2014 0500   CALCIUM 8.9 08/12/2014 0858   PROT 5.6* 10/24/2014 0500   PROT 7.1 08/12/2014 0858   ALBUMIN 3.1* 10/24/2014 0500   ALBUMIN 3.7 08/12/2014 0858   AST 15 10/24/2014 0500   AST 16 08/12/2014 0858   ALT 12* 10/24/2014 0500   ALT 15* 08/12/2014 0858   ALKPHOS 62 10/24/2014 0500   ALKPHOS 69 08/12/2014 0858   BILITOT 0.3 10/24/2014 0500   GFRNONAA >60 10/24/2014 0500   GFRNONAA 49* 08/12/2014 0858   GFRAA >60 10/24/2014 0500   GFRAA 57* 08/12/2014 0858   PT/INR No results for input(s): LABPROT, INR in the last 72 hours.  Studies/Results: Dg C-arm 1-60 Min  10/23/2014   CLINICAL DATA:  Ureteral stent.  EXAM: DG C-ARM 61-120 MIN  COMPARISON:  None.  FINDINGS: Images from bilateral ureteral stent placement. Proximal tips peer projected over the kidneys. Contrast is in the left renal collecting system.  IMPRESSION: Bilateral ureteral stent placement.   Electronically Signed   By: Marcello Moores  Register   On: 10/23/2014 09:09    Assessment/Plan: He is doing well. We will increase his activity level today. I will continue to keep him on ice chips until we see some flatus in the ostomy bag. I discussed the plan with the patient and his wife and they're in agreement.

## 2014-10-24 NOTE — Care Management (Signed)
Spoke with patient for discharge planning. Patient will ned PT evaluation prior to discharge. Anticipate need for home health at time of discharge. Patient needs wound care consult for ostomy care. Discussed with Nurse Orlinda Blalock. Patient lives at home with spouse and stated that he does have difficulty ambulating prior to surgery. He stated that he has "blockages in his legs".  Does not have any other DME. Continue to follow for changes in discharge plan.

## 2014-10-24 NOTE — Progress Notes (Signed)
Initial Nutrition Assessment  INTERVENTION:  Education: will provide education on nutrition therapy on follow-up as appropriate. Medical Food Supplement Therapy: will recommend on follow if diet order able to be advanced and po intake poor; will follow poc   NUTRITION DIAGNOSIS:  Inadequate oral intake related to inability to eat as evidenced by NPO status.  GOAL:   (Diet advancement as medically able )  MONITOR:   (Energy Intake, Electrolyte and renal Profile, Digestive System,)  REASON FOR ASSESSMENT:  Malnutrition Screening Tool    ASSESSMENT:  Pt s/p lower anterior bowel resection and ileostomy placement with cystoscopy and stent placement this am. Pt with h/o rectal cancer. Pt currently sitting in chair on visit.  PMHx:  Past Medical History  Diagnosis Date  . Rectal cancer   . Cancer   . Iron deficiency anemia   . Pulmonary nodules   . Hypertension   . Hyperlipidemia   . Hematuria   . Colon cancer   . Myocardial infarction   . Ureter, stricture   . Gout     Diet Order:  Diet NPO time specified Except for: Sips with Meds  Current Nutrition: Pt NPO since admission  Food/Nutrition-Related History: Pt reports not eating for 2 days PTA but reports appetite was doing ok before that. Pt reports usually eating 3 meals per day without supplements. Per MST decreased appetite PTA.   Medications: D5 NS with KCl at 122mL/hr (providing 510kcals in 24 hours), Morphine PCA  Electrolyte/Renal Profile and Glucose Profile:   Recent Labs Lab 10/23/14 1539 10/24/14 0500  NA  --  140  K  --  4.1  CL  --  111  CO2  --  23  BUN  --  15  CREATININE 1.24 1.25*  CALCIUM  --  8.4*  GLUCOSE  --  138*   Protein Profile:   Recent Labs Lab 10/24/14 0500  ALBUMIN 3.1*    Gastrointestinal Profile: Ileostomy present: no flatus yet, hypoactive BS, tender abdomen per Nsg   Nutrition-Focused Physical Exam Findings:  Unable to complete Nutrition-Focused physical exam  at this time.    Weight Change: Pt reports stable weight of 190lbs. Per [facility] slow weight gain over the past few months. Per MST weight loss PTA. Anthropometrics:   Height:  Ht Readings from Last 1 Encounters:  10/23/14 5\' 6"  (1.676 m)    Weight:  Wt Readings from Last 1 Encounters:  10/24/14 210 lb 1.6 oz (95.3 kg)    Wt Readings from Last 10 Encounters:  10/24/14 210 lb 1.6 oz (95.3 kg)  10/08/14 193 lb 9.6 oz (87.816 kg)  10/04/14 192 lb 3.9 oz (87.2 kg)  08/26/14 192 lb 10.9 oz (87.4 kg)  08/12/14 191 lb 9.3 oz (86.901 kg)  07/23/14 203 lb 7.8 oz (92.3 kg)    BMI:  Body mass index is 33.93 kg/(m^2).  Estimated Nutritional Needs:  Kcal:  1893-2713kcals, BEE: 1393kcals TEE: (IF 1.1-1.3)(AF 1.2)   Protein:  65-77g protein (1.0-1.2g/kg)  Fluid:  1613-1924mL of fluid (25-95mL/kg)  Skin:  Reviewed, no issues  EDUCATION NEEDS:  Education needs no appropriate at this time  [place], RD, LDN Pager [phone]

## 2014-10-25 NOTE — Progress Notes (Signed)
2 Days Post-Op   Subjective:  He is mildly lethargic but arousable. His wife reports that he has not really had much pain in the last 24 hours. He seems to be dosing intermittently. He denies any significant shortness of breath. He is not nauseated. He's not had any bowel function as yet.  Vital signs in last 24 hours: Temp:  [98.6 F (37 C)-99.8 F (37.7 C)] 99.7 F (37.6 C) (07/08 0819) Pulse Rate:  [61-88] 88 (07/08 0819) Resp:  [16-23] 20 (07/08 0521) BP: (126-161)/(74-91) 161/91 mmHg (07/08 0819) SpO2:  [91 %-97 %] 94 % (07/08 0819) FiO2 (%):  [94 %-95 %] 95 % (07/07 2240) Weight:  [92.806 kg (204 lb 9.6 oz)] 92.806 kg (204 lb 9.6 oz) (07/08 0500)    Intake/Output from previous day: 07/07 0701 - 07/08 0700 In: 2951 [I.V.:2951] Out: 1910 [Urine:1900; Stool:10]  GI: His abdomen is soft mildly distended with no evidence of any significant drainage from his Penrose drains. The ostomy is viable but mildly edematous. He does have active bowel sounds.  Lab Results:  CBC  Recent Labs  10/23/14 1539 10/24/14 0500  WBC 9.4 8.2  HGB 10.7* 10.0*  HCT 34.8* 32.7*  PLT 230 231   CMP     Component Value Date/Time   NA 140 10/24/2014 0500   NA 132* 08/12/2014 0858   K 4.1 10/24/2014 0500   K 4.0 08/12/2014 0858   CL 111 10/24/2014 0500   CL 100* 08/12/2014 0858   CO2 23 10/24/2014 0500   CO2 24 08/12/2014 0858   GLUCOSE 138* 10/24/2014 0500   GLUCOSE 118* 08/12/2014 0858   BUN 15 10/24/2014 0500   BUN 30* 08/12/2014 0858   CREATININE 1.25* 10/24/2014 0500   CREATININE 1.52* 08/12/2014 0858   CALCIUM 8.4* 10/24/2014 0500   CALCIUM 8.9 08/12/2014 0858   PROT 5.6* 10/24/2014 0500   PROT 7.1 08/12/2014 0858   ALBUMIN 3.1* 10/24/2014 0500   ALBUMIN 3.7 08/12/2014 0858   AST 15 10/24/2014 0500   AST 16 08/12/2014 0858   ALT 12* 10/24/2014 0500   ALT 15* 08/12/2014 0858   ALKPHOS 62 10/24/2014 0500   ALKPHOS 69 08/12/2014 0858   BILITOT 0.3 10/24/2014 0500   GFRNONAA  >60 10/24/2014 0500   GFRNONAA 49* 08/12/2014 0858   GFRAA >60 10/24/2014 0500   GFRAA 57* 08/12/2014 0858   PT/INR No results for input(s): LABPROT, INR in the last 72 hours.  Studies/Results: Dg C-arm 1-60 Min  10/23/2014   CLINICAL DATA:  Ureteral stent.  EXAM: DG C-ARM 61-120 MIN  COMPARISON:  None.  FINDINGS: Images from bilateral ureteral stent placement. Proximal tips peer projected over the kidneys. Contrast is in the left renal collecting system.  IMPRESSION: Bilateral ureteral stent placement.   Electronically Signed   By: Marcello Moores  Register   On: 10/23/2014 09:09    Assessment/Plan: He has been out of bed in a chair part of yesterday and we will encourage him to continue his activity. I anticipate we would ask PT to see him in the next 24 hours to assist with his activity recovery. We will not start a diet until he has an opportunity to have some more bowel function. I'll recheck his laboratory values tomorrow.  His urine is clearing up. We appreciate urology follow-up.

## 2014-10-25 NOTE — Progress Notes (Signed)
SNF and Non-Emergent EMS Transport Benefits:  Number called: [phone] Rep: Festus Holts Reference Number: [id number]  [employer] Open Access Plus Plan active since 09/17/12. $750 individual in-network deductible and $3000 individual in-network out of pocket max, both of which have been met for calendar year.     In-network SNF: Covered at 100% since deductible and out of pocket met.   Limited to 120 SNF days per calendar year, all available at this time.  Josem Kaufmann is required: [phone].    Non-emergent EMS transport:  Covered at 100% since deductible and out of pocket met. Must be medically necessary.   Josem Kaufmann is not required for ground transport using service codes [phone] or [phone].

## 2014-10-25 NOTE — Evaluation (Signed)
Physical Therapy Evaluation Patient Details Name: [name] DENOMME [name] MRN: [id number] DOB: 12/27/1955 Today's Date: 10/25/2014   History of Present Illness  Patient is a very pleasant 59 y/o male that presents s/p lower anterior bowel resection, urethral stent, and ex-lap (POD#2). Patient has a past medical history of rectal cancer.   Clinical Impression  Patient is a pleasant 59 y/o male that presents after abdominal surgery. Patient was modified independent with short distance ambulation with single point cane prior to this admission. Today patient presents with decreased bed mobility, OOB mobility, and OOB transfers secondary to generalized weakness and abdominal pain. Patient is unable to maintain erect posture in ambulation secondary to abdominal pain and displays poor gait safety as he maintains significant trunk flexion throughout ambulation. Given his decreased safety and independence with mobility patient would benefit from skilled short term rehabilitation. Skilled acute PT services are indicated to address the above deficits.     Follow Up Recommendations SNF    Equipment Recommendations       Recommendations for Other Services       Precautions / Restrictions Precautions Precautions: Fall Restrictions Weight Bearing Restrictions: No      Mobility  Bed Mobility Overal bed mobility: Needs Assistance Bed Mobility: Sit to Supine       Sit to supine: Mod assist   General bed mobility comments: Patient requires assistance for LEs and managing lines/tubes secondary to abdominal pain.   Transfers Overall transfer level: Needs assistance Equipment used: Rolling walker (2 wheeled) Transfers: Sit to/from Stand Sit to Stand: Mod assist         General transfer comment: Patient requires prolonged time and cuing for safe hand placement to successfully make sit to stand transfer. Patient displays decreased LE strength and is unable to stand with his hands on the walker.  Patient prefers to use handrails from bedside chair.   Ambulation/Gait Ambulation/Gait assistance: Min guard Ambulation Distance (Feet): 35 Feet Assistive device: Rolling walker (2 wheeled) Gait Pattern/deviations: Step-to pattern;Decreased step length - right;Decreased step length - left;Narrow base of support;Trunk flexed     General Gait Details: Patient maintains flexed trunk posture throughout gait with very short steps with RW. Patient encouraged to maintain upright trunk posture, but was unable to maintain.  Stairs            Wheelchair Mobility    Modified Rankin (Stroke Patients Only)       Balance Overall balance assessment: Needs assistance Sitting-balance support: Feet unsupported Sitting balance-Leahy Scale: Good       Standing balance-Leahy Scale: Poor Standing balance comment: Patient maintains trunk flexed posture throughout standing and gait secondary to abdominal pain. Patient displays LE weakness and is unable to maintain himself upright against gravity without RW at this time.                              Pertinent Vitals/Pain Pain Assessment:  (Patient experiencing pain in abdominal region with ambulation, he does not rate this however he changes his body position to be in trunk flexion to minimize pressure on abdominal musculature. )    Home Living Family/patient expects to be discharged to:: Private residence Living Arrangements: Spouse/significant other Available Help at Discharge: Family Type of Home: House Home Access: Stairs to enter Entrance Stairs-Rails: Can reach both Entrance Stairs-Number of Steps: 4 Home Layout: One level Home Equipment: Cane - single point      Prior Function Level of  Independence: Independent with assistive device(s)         Comments: Patient had been ambulating limited distances with single point cane at home prior to admission.      Hand Dominance        Extremity/Trunk Assessment    Upper Extremity Assessment: Overall WFL for tasks assessed           Lower Extremity Assessment: Overall WFL for tasks assessed         Communication   Communication: No difficulties (Very soft spoken. )  Cognition Arousal/Alertness: Awake/alert Behavior During Therapy: WFL for tasks assessed/performed Overall Cognitive Status: Within Functional Limits for tasks assessed                      General Comments      Exercises        Assessment/Plan    PT Assessment Patient needs continued PT services  PT Diagnosis Difficulty walking;Acute pain;Generalized weakness   PT Problem List Decreased strength;Pain;Decreased balance;Decreased mobility;Decreased knowledge of use of DME;Decreased activity tolerance  PT Treatment Interventions DME instruction;Gait training;Therapeutic activities;Therapeutic exercise;Stair training   PT Goals (Current goals can be found in the Care Plan section) Acute Rehab PT Goals Patient Stated Goal: Would like to return home safely when appropriate.  PT Goal Formulation: With patient Time For Goal Achievement: 11/08/14 Potential to Achieve Goals: Good    Frequency Min 2X/week   Barriers to discharge Inaccessible home environment;Decreased caregiver support Patient requires fairly heavy physical assistance currently to navigate through limited distances.     Co-evaluation               End of Session Equipment Utilized During Treatment: Gait belt;Oxygen Activity Tolerance: Patient tolerated treatment well;Patient limited by fatigue Patient left: in bed;with call bell/phone within reach;with bed alarm set Nurse Communication: Mobility status         Time: 0932-3557 PT Time Calculation (min) (ACUTE ONLY): 26 min   Charges:   PT Evaluation $Initial PT Evaluation Tier I: 1 Procedure     PT G Codes:      Kerman Passey, PT, DPT    10/25/2014, 3:45 PM

## 2014-10-25 NOTE — Care Management (Signed)
Spoke with Dr Pat Patrick concerning order for home with Lovenox. Dr Pat Patrick stated that he did not think patient would need this. Continue to follow for changes. PT consult ordered for today.

## 2014-10-25 NOTE — Consult Note (Signed)
WOC ostomy consult note Stoma type/location: RLQ Ileostomy with retention rod in place.  (unsutured) Stomal assessment/size: 1 3/8" cut slightly larger to acomodate rod. Wife is at the bedside and she is an Therapist, sports for [employer].  Peristomal assessment: Intact.  Midline abdominal surgical wound with honeycomb dressing in place.  Treatment options for stomal/peristomal skin: None needed.   Output Liquid green stool in pouch.  Patient states nurse has emptied x1 today.  States he has had gas.  Ostomy pouching: 2pc. 2 1/4" pouch  Education provided: POuch is removed and a new one replaced with wife at bedside.  Reviewed stomal cleansing, pouch change frequency and rationale for retention rod. Barrier is cut with edges trimmed to avoid midline abdominal dressing. POuch is replaced.  Roll closure is reviewed.  Patient is groggy today but wife is attentive and confident they can manage this at home.  Enrolled patient in [place] program: Yes [place] team will continue to follow and remain available to patient, medical and nursing teams.   Domenic Moras RN BSN [place] Pager [phone]

## 2014-10-25 NOTE — Clinical Social Work Note (Signed)
Clinical Social Work Assessment  Patient Details  Name: [name] [name]. MRN: [id number] Date of Birth: [birth year]  Date of referral:  10/25/14               Reason for consult:  Facility Placement                Permission sought to share information with:  Family Supports Permission granted to share information::  Yes, Verbal Permission Granted  Name::        Agency::     Relationship::     Contact Information:     Housing/Transportation Living arrangements for the past 2 months:   (home) Source of Information:  Patient, Spouse Patient Interpreter Needed:  None Criminal Activity/Legal Involvement Pertinent to Current Situation/Hospitalization:  No - Comment as needed Significant Relationships:  Spouse Lives with:  Spouse Do you feel safe going back to the place where you live?  Yes Need for family participation in patient care:  Yes (Comment)  Care giving concerns:  Patient and wife reside together   Facilities manager / plan:  CSW met with patient and patient's wife this afternoon and they are aware of the PT recommendation. Patient and wife stated that the physician stated that he might be in the hospital for 10 days and that this is his second day. They are both open to CSW beginning a bedsearch but are hoping that patient will be able to return home at discharge. Patient's wife works at [employer] in the [employer].  Employment status:    Insurance information:  Managed Care PT Recommendations:  [place] / Referral to community resources:  [place]  Patient/Family's Response to care:  Patient was in some obvious discomfort but very pleasant.   Patient/Family's Understanding of and Emotional Response to Diagnosis, Current Treatment, and Prognosis:  Patient and wife are hoping that he can return home but are open to rehab if needed. They prefer [facility]. Patient has Christella Scheuermann and will require authorization. Bedsearch  initiated.   Emotional Assessment Appearance:  Appears stated age Attitude/Demeanor/Rapport:   (pleasant, cooperative, having pain) Affect (typically observed):  Accepting, Appropriate Orientation:  Oriented to Self, Oriented to Place, Oriented to  Time, Oriented to Situation Alcohol / Substance use:  Not Applicable Psych involvement (Current and /or in the community):  No (Comment)  Discharge Needs  Concerns to be addressed:  Care Coordination Readmission within the last 30 days:  No Current discharge risk:  None Barriers to Discharge:  No Barriers Identified   Shela Leff, LCSW 10/25/2014, 4:08 PM

## 2014-10-26 LAB — CBC
HEMATOCRIT: 33.6 % — AB (ref 40.0–52.0)
HEMOGLOBIN: 10.4 g/dL — AB (ref 13.0–18.0)
MCH: 25.1 pg — AB (ref 26.0–34.0)
MCHC: 31 g/dL — ABNORMAL LOW (ref 32.0–36.0)
MCV: 81 fL (ref 80.0–100.0)
Platelets: 235 10*3/uL (ref 150–440)
RBC: 4.15 MIL/uL — AB (ref 4.40–5.90)
RDW: 18 % — ABNORMAL HIGH (ref 11.5–14.5)
WBC: 8.4 10*3/uL (ref 3.8–10.6)

## 2014-10-26 LAB — BASIC METABOLIC PANEL
ANION GAP: 8 (ref 5–15)
BUN: 11 mg/dL (ref 6–20)
CO2: 26 mmol/L (ref 22–32)
Calcium: 8.9 mg/dL (ref 8.9–10.3)
Chloride: 104 mmol/L (ref 101–111)
Creatinine, Ser: 1.13 mg/dL (ref 0.61–1.24)
GFR calc Af Amer: >60 mL/min (ref >60)
GLUCOSE: 109 mg/dL — AB (ref 65–99)
POTASSIUM: 3.9 mmol/L (ref 3.5–5.1)
Sodium: 138 mmol/L (ref 135–145)

## 2014-10-26 NOTE — Progress Notes (Signed)
Attempted to call Dr.Cooper at [phone] to let him know the ileostomy bar had come disconnected from stoma during appliance change.no answer at this time. [name] [name]

## 2014-10-27 LAB — CBC
HEMATOCRIT: 36.4 % — AB (ref 40.0–52.0)
Hemoglobin: 11.7 g/dL — ABNORMAL LOW (ref 13.0–18.0)
MCH: 25.9 pg — AB (ref 26.0–34.0)
MCHC: 32.1 g/dL (ref 32.0–36.0)
MCV: 80.6 fL (ref 80.0–100.0)
Platelets: 301 10*3/uL (ref 150–440)
RBC: 4.52 MIL/uL (ref 4.40–5.90)
RDW: 17.6 % — ABNORMAL HIGH (ref 11.5–14.5)
WBC: 8.1 10*3/uL (ref 3.8–10.6)

## 2014-10-27 MED ORDER — MORPHINE SULFATE 1 MG/ML IV SOLN
INTRAVENOUS | Status: DC
  Administered 2014-10-27 (×2): 2 mg via INTRAVENOUS
  Administered 2014-10-27: 0 mg via INTRAVENOUS
  Administered 2014-10-27 – 2014-10-28 (×2): 2 mg via INTRAVENOUS
  Administered 2014-10-28 (×2): 1 mg via INTRAVENOUS
  Administered 2014-10-28: 2 mg via INTRAVENOUS

## 2014-10-27 NOTE — Progress Notes (Signed)
Physical Therapy Treatment Patient Details Name: [name] BOSTWICK [name] MRN: [id number] DOB: 13-Apr-1956 Today's Date: 10/27/2014    History of Present Illness Patient is a very pleasant 59 y/o male that presents s/p lower anterior bowel resection, urethral stent, and ex-lap (POD#2). Patient has a past medical history of rectal cancer.     PT Comments    Pt with increased gait distance this date and improved strength with transfers and bed mobility.  Pt walking 220' with RW and supervision.  Pt continues with forward flexed posture but is able to increase extension with verbal cues.  Recommend pt to try and lower head of bed to assist with lengthening on abdominal tissues.  Change discharge recommendation to HHPT due to pt's progress with therapy.  Follow Up Recommendations  Home health PT     Equipment Recommendations       Recommendations for Other Services       Precautions / Restrictions      Mobility  Bed Mobility Overal bed mobility: Modified Independent Bed Mobility: Supine to Sit;Sit to Supine     Supine to sit: Modified independent (Device/Increase time);HOB elevated Sit to supine: Modified independent (Device/Increase time);HOB elevated      Transfers Overall transfer level: Modified independent Equipment used: Rolling walker (2 wheeled) Transfers: Sit to/from Stand Sit to Stand: Modified independent (Device/Increase time);Supervision         General transfer comment: Good recall of hand placement on seated surface, extra time needed for pain.  Ambulation/Gait Ambulation/Gait assistance: Supervision Ambulation Distance (Feet): 220 Feet Assistive device: Rolling walker (2 wheeled)       General Gait Details: Decreased trunk flexion this session but still needs cues to stand tall; cadence slow, but steady.   Stairs            Wheelchair Mobility    Modified Rankin (Stroke Patients Only)       Balance Overall balance assessment: Modified  Independent                                  Cognition Arousal/Alertness: Awake/alert Behavior During Therapy: WFL for tasks assessed/performed Overall Cognitive Status: Within Functional Limits for tasks assessed                      Exercises      General Comments General comments (skin integrity, edema, etc.): ileostomy intact;open abdominal drain with partial bandage      Pertinent Vitals/Pain Pain Assessment: 0-10 Pain Score: 6  Pain Descriptors / Indicators: Cramping Pain Intervention(s): Limited activity within patient's tolerance;Monitored during session    Home Living                      Prior Function            PT Goals (current goals can now be found in the care plan section)      Frequency  Min 2X/week    PT Plan Current plan remains appropriate    Co-evaluation             End of Session Equipment Utilized During Treatment: Gait belt Activity Tolerance: Patient tolerated treatment well;Patient limited by pain Patient left: in bed;with call bell/phone within reach;with family/visitor present;with SCD's reapplied     Time: 3329-5188 PT Time Calculation (min) (ACUTE ONLY): 32 min  Charges:  $Therapeutic Exercise: 23-37 mins  G Codes:      Weda Baumgarner A Macarthur Lorusso 10/27/2014, 11:19 AM

## 2014-10-27 NOTE — Consult Note (Signed)
WOC ostomy follow up Stoma type/location: RLQ ileostomy.  Rod came out yesterday and pouching system has been leaking intermittently since yesterday.  Three pouch changes required since [facility] nurse visit on Friday. Patient's wife in room. Stomal assessment/size: Stoma is slightly smaller than 1 and 5/8 inches, mildly oval. Red, moist mucosa, sutures intact and slight elevation. Appears stable without rod. Abdomen is flaccid with creases noted medially and laterally, medially > laterally. Peristomal assessment: Intact with two pronounced creases at 3 and 9 o'clock when patient sitting with HOB elevation. Treatment options for stomal/peristomal skin: Skin barrier ring placed today. Output: Liquid green/brown effluent Ostomy pouching: 2pc., 2 and 1/4 inch system with skin barrier ring used today. If this system leaks, will likely move to convex, 1 piece ostomy pouching system, with skin barrier ring with WOC Nurse visit tomorrow. Education provided: Wife is shown back of pouching system as we removed it and it is clear by visualization of pectin washout that this pouching system was within hours of failing (#3 since [facility] Nurse visit on Friday). Demonstrated reason for pouch failures by elevating HOB and illustrating creases at 3 and 9 o'clock.  Demonstrated stoma sizing using stoma sizing guide, use of skin barrier ring around stoma and preparation of skin barrier by tracing and cutting out pattern. Skin barrier applied on top of skin barrier ring, then corresponding pouch attached.  Lock and Roll closure demonstrated.  Education today regarding BRAT diet as a means of regulating stool consistency (Bananas, applesauce, rice and tapioca) and patient's wife indicated good understanding. Further teaching on this pouching system is mute until we determine if it is adequate.  I illustrate what a convex pouching system would entail so that in the event that the following [place] nurse needs that intervention, they are  prepared for it.  Should stoma decrease in elevation, it is likely that a convex pouching system will be required.  Supplies (3 set-ups, including pattern and skin barrier rings) are at bedside in the event that staff have to change the pouching system today or tonight. Patient and wife are reassured that he will be clean, dry and odor-free in pouching systems that last at least 2-3 days. They are grateful for the encouragement following a rough day and night yesterday and look forward to the [facility] Nurse visit tomorrow. Enrolled patient in [place] Start Discharge program: Yes,but pouching system of choice has not yet been identified.  Will need to [employer] and amend registration on Monday. [place] nursing team will continue to follow closely, and will remain available to this patient, the nursing, surgical and medical teams.   Thanks, Maudie Flakes, MSN, RN, [place], [place], [place] [phone])

## 2014-10-27 NOTE — Progress Notes (Signed)
4 Days Post-Op   Subjective:  He is sitting up along the bed complaining some gas pains but is overall feeling better. He is not nauseated is able to take some clear liquids. He's had some moderate drainage from the wound but it appears to be nonpurulent. He is having some ostomy function. He is getting good pain control present time.  Vital signs in last 24 hours: Temp:  [98.4 F (36.9 C)-99.2 F (37.3 C)] 98.4 F (36.9 C) (07/10 0710) Pulse Rate:  [71-80] 80 (07/10 0710) Resp:  [13-24] 19 (07/10 0333) BP: (93-118)/(61-84) 95/61 mmHg (07/10 0710) SpO2:  [97 %-100 %] 99 % (07/10 0333) FiO2 (%):  [0 %-100 %] 0 % (07/10 0333) Weight:  [85.004 kg (187 lb 6.4 oz)] 85.004 kg (187 lb 6.4 oz) (07/10 0545)    Intake/Output from previous day: 07/09 0701 - 07/10 0700 In: 2279.3 [P.O.:1200; I.V.:1079.3] Out: 2725 [Urine:1125; Stool:1600]  GI: Wound looks good. There is no sign of any infection. His ostomy is healthy.  Lab Results:  CBC  Recent Labs  10/26/14 0702 10/27/14 0426  WBC 8.4 8.1  HGB 10.4* 11.7*  HCT 33.6* 36.4*  PLT 235 301   CMP     Component Value Date/Time   NA 138 10/26/2014 0702   NA 132* 08/12/2014 0858   K 3.9 10/26/2014 0702   K 4.0 08/12/2014 0858   CL 104 10/26/2014 0702   CL 100* 08/12/2014 0858   CO2 26 10/26/2014 0702   CO2 24 08/12/2014 0858   GLUCOSE 109* 10/26/2014 0702   GLUCOSE 118* 08/12/2014 0858   BUN 11 10/26/2014 0702   BUN 30* 08/12/2014 0858   CREATININE 1.13 10/26/2014 0702   CREATININE 1.52* 08/12/2014 0858   CALCIUM 8.9 10/26/2014 0702   CALCIUM 8.9 08/12/2014 0858   PROT 5.6* 10/24/2014 0500   PROT 7.1 08/12/2014 0858   ALBUMIN 3.1* 10/24/2014 0500   ALBUMIN 3.7 08/12/2014 0858   AST 15 10/24/2014 0500   AST 16 08/12/2014 0858   ALT 12* 10/24/2014 0500   ALT 15* 08/12/2014 0858   ALKPHOS 62 10/24/2014 0500   ALKPHOS 69 08/12/2014 0858   BILITOT 0.3 10/24/2014 0500   GFRNONAA >60 10/26/2014 0702   GFRNONAA 49* 08/12/2014  0858   GFRAA >60 10/26/2014 0702   GFRAA 57* 08/12/2014 0858   PT/INR No results for input(s): LABPROT, INR in the last 72 hours.  Studies/Results: No results found.  Assessment/Plan: He continues to improve. We'll try to remove his drain later this morning when he is back in bed. We will advance his diet as his ostomy function improves. I'm going to try to reduce his morphine doses by configured on how to get it ordered in the new computer system.

## 2014-10-28 DIAGNOSIS — C20 Malignant neoplasm of rectum: Principal | ICD-10-CM

## 2014-10-28 MED ORDER — HYDROCODONE-ACETAMINOPHEN 5-325 MG PO TABS
1.0000 | ORAL_TABLET | Freq: Four times a day (QID) | ORAL | Status: DC | PRN
  Administered 2014-10-28 – 2014-11-01 (×8): 1 via ORAL
  Filled 2014-10-28 (×9): qty 1

## 2014-10-28 MED ORDER — MORPHINE SULFATE 2 MG/ML IJ SOLN
2.0000 mg | INTRAMUSCULAR | Status: DC | PRN
  Administered 2014-10-30: 2 mg via INTRAVENOUS
  Filled 2014-10-28: qty 1

## 2014-10-28 NOTE — Progress Notes (Signed)
Physical Therapy Treatment Patient Details Name: [name] NIBLETT [name] MRN: [id number] DOB: June 05, 1955 Today's Date: 10/28/2014    History of Present Illness Patient is a very pleasant 59 y/o male that presents s/p lower anterior bowel resection, urethral stent, and ex-lap (POD#2). Patient has a past medical history of rectal cancer.     PT Comments    Pt making good progress with therapy. He is modified independent for bed mobility and transfers and supervision only for ambulation. Pt is lethargic today and requires encouragement to progress ambulation distance and exercises. He also ambulates with forward trunk posture due to pain at site of incision. Encouraged upright posture with ambulation. Pt provided strategies to minimize pain with transfers including log rolling and bear hugging pillow. Pt will benefit from skilled PT services to address deficits in strength, balance, and mobility in order to return to full function at home.    Follow Up Recommendations  Home health PT     Equipment Recommendations  Rolling walker with 5" wheels    Recommendations for Other Services       Precautions / Restrictions Precautions Precautions: Fall Restrictions Weight Bearing Restrictions: No    Mobility  Bed Mobility Overal bed mobility: Modified Independent Bed Mobility: Supine to Sit;Sit to Supine     Supine to sit: Modified independent (Device/Increase time);HOB elevated Sit to supine: Modified independent (Device/Increase time);HOB elevated   General bed mobility comments: Pt is lethargic and moves very slow but is eventually able to come to upright sitting. Cues provided for upright posture. Pt instructed in bear hugging pillow to minimize pain as well as log rolling.   Transfers Overall transfer level: Modified independent Equipment used: Rolling walker (2 wheeled) Transfers: Sit to/from Stand Sit to Stand: Modified independent (Device/Increase time)         General  transfer comment: Good recall of hand placement on seated surface, extra time needed for pain.  Ambulation/Gait Ambulation/Gait assistance: Supervision Ambulation Distance (Feet): 450 Feet Assistive device: Rolling walker (2 wheeled) Gait Pattern/deviations: Step-through pattern   Gait velocity interpretation: <1.8 ft/sec, indicative of risk for recurrent falls General Gait Details: Pt with forward trunk flexion requiring intermittent cues for upright posture. Pain and vitals monitored throughout. SaO2>90% for entirety of ambulation. HR increases from rest but never exceeds 100 bpm. Standing rest break provided. Pt encouraged to increase ambulation distance today. Encouraged to get up an ambulate with RN later this PM for at least 2 laps around RN station.    Stairs            Wheelchair Mobility    Modified Rankin (Stroke Patients Only)       Balance Overall balance assessment: Modified Independent   Sitting balance-Leahy Scale: Good       Standing balance-Leahy Scale: Fair                      Cognition Arousal/Alertness: Lethargic Behavior During Therapy: WFL for tasks assessed/performed Overall Cognitive Status: Within Functional Limits for tasks assessed                      Exercises General Exercises - Lower Extremity Long Arc Quad: Strengthening;Both;10 reps;Seated Heel Slides: Strengthening;Both;10 reps;Seated Hip ABduction/ADduction: Strengthening;Both;10 reps;Seated Straight Leg Raises: Strengthening;Both;10 reps;Supine Hip Flexion/Marching: Strengthening;Both;10 reps;Seated Heel Raises: Strengthening;Both;5 reps;Seated    General Comments        Pertinent Vitals/Pain Pain Assessment: 0-10 Pain Score: 5  Pain Location: Incision on abdomen Pain Intervention(s): Limited  activity within patient's tolerance;Monitored during session    Home Living                      Prior Function            PT Goals (current goals  can now be found in the care plan section) Acute Rehab PT Goals Patient Stated Goal: Would like to return home safely when appropriate.  PT Goal Formulation: With patient/family Time For Goal Achievement: 11/08/14 Potential to Achieve Goals: Good Progress towards PT goals: Progressing toward goals    Frequency  Min 2X/week    PT Plan Current plan remains appropriate    Co-evaluation             End of Session Equipment Utilized During Treatment: Gait belt Activity Tolerance: Patient tolerated treatment well;Patient limited by pain Patient left: in bed;with call bell/phone within reach;with family/visitor present;with bed alarm set     Time: 6803-2122 PT Time Calculation (min) (ACUTE ONLY): 28 min  Charges:  $Therapeutic Exercise: 23-37 mins                    G Codes:      [name] [name] PT, DPT   [name],[name] [name] 10/28/2014, 3:25 PM

## 2014-10-28 NOTE — Consult Note (Signed)
WOC ostomy follow up Stoma type/location: RLQ ILeostomy Stomal assessment/size: Pouch is intact and not changed today.  Leaked over the weekend and would like to rest the peristomal skin today. Patient and wife in agreement with this . Barrier rings and pouches at the bedside. Written materials given to patient and wife.  Peristomal assessment: Implemented barrier ring for creasing at 3 and 9 o'clock due to frequent leaking over the weekend.  Treatment options for stomal/peristomal skin: Barrier ring Output Liquid green output Ostomy pouching: 2pc. 2 1/4" pouch with barrier ring  Education provided: Written materials given to wife and patient.  Since pouch is not leaking, would like to change tomorrow. Had 3 pouch changes over the weekend.  Enrolled patient in [place] Start Discharge program: Yes [place] team will continue to follow.  Domenic Moras RN BSN [place] Pager [phone]

## 2014-10-28 NOTE — Progress Notes (Signed)
Nutrition Follow-up  INTERVENTION:  Meals and Snacks: Cater to patient preferences Medical Food Supplement Therapy: pt may benefit from supplement if po intake poor on follow-up Education: RD provided "Ileostomy Nutrition Therapy" handout from the Academy of [employer] and Dietetics. RD discussed with wife, foods for thickening stools as well as foods that reciprocate other symptoms such as gas causing foods, odor causing foods, foods when pt with diarrhea, etc. Other recommendations provided such as small more frequent meals and increasing fluid intake.  RD also provided "Low Fiber Nutrition Therapy" handout from the Academy of [employer] and Dietetics. Discussed nutrition therapy to reduce the irritation of the gastrointestinal tract to promote healing. Provided list of recommended low fiberous foods in comparison to foods with high amounts of fiber, as well as a recommended sample day. Teach back method used. Expect good compliance. Wife very appreciative.   NUTRITION DIAGNOSIS:  Inadequate oral intake related to inability to eat as evidenced by NPO status; improved with diet advancement  GOAL:  Patient will meet greater than or equal to 90% of their needs; ongoing  MONITOR:   (Energy Intake, Electrolyte and renal Profile, Digestive System,)  REASON FOR ASSESSMENT:  Malnutrition Screening Tool    ASSESSMENT:  Pt POD5 ileostomy placement. Pt walking with PT around unit this afternoon.   Diet Order: Regular  Current Nutrition: Pt ate some mashed potatoes and applesauce for lunch today, tolerating well so far. Diet order just advanced this am from CL.   Gastrointestinal Profile: Last BM: 211mL out ostomy this am, 768mL out ostomy over the past 24 hours Urine Volume: 222mL UOP this am   Medications: D5 0.45%NS with KCl at 56mL/hr (providing 204kcals in 24 hours)  Electrolyte/Renal Profile and Glucose Profile:   Recent Labs Lab 10/23/14 1539 10/24/14 0500 10/26/14 0702   NA  --  140 138  K  --  4.1 3.9  CL  --  111 104  CO2  --  23 26  BUN  --  15 11  CREATININE 1.24 1.25* 1.13  CALCIUM  --  8.4* 8.9  GLUCOSE  --  138* 109*   Protein Profile:  Recent Labs Lab 10/24/14 0500  ALBUMIN 3.1*     Nutrition-Focused Physical Exam Findings:  Unable to complete Nutrition-Focused physical exam at this time.    Weight Trend since Admission: RD notes weight loss Filed Weights   10/26/14 0500 10/27/14 0545 10/28/14 0323  Weight: 202 lb 8 oz (91.853 kg) 187 lb 6.4 oz (85.004 kg) 185 lb 8 oz (84.142 kg)    BMI:  Body mass index is 29.95 kg/(m^2).  Estimated Nutritional Needs:  Kcal:  1893-2713kcals, BEE: 1393kcals TEE: (IF 1.1-1.3)(AF 1.2)   Protein:  65-77g protein (1.0-1.2g/kg)  Fluid:  1613-1970mL of fluid (25-77mL/kg)  Skin:  Reviewed, no issues  Diet Order:  Diet regular Room service appropriate?: Yes; Fluid consistency:: Thin  EDUCATION NEEDS:  Education needs addressed   [place], RD, LDN Pager [phone]

## 2014-10-28 NOTE — Progress Notes (Signed)
[facility]   PATIENT NAME: [name] [name]    MR#:  [id number]  DATE OF BIRTH:  1955/05/28  SUBJECTIVE:   Postoperative day #5.  He has passed some per rectum. Ileostomy is working. He is tolerating clear liquid diet. He only took 3 mg of morphine per last 24 hours on his PCA pump.  His wife is concerned he requires rehabilitation upon discharge.  REVIEW OF SYSTEMS:   Review of Systems  Constitutional: Negative for fever.  Gastrointestinal: Positive for abdominal pain. Negative for nausea and vomiting.    DRUG ALLERGIES:   Allergies  Allergen Reactions  . No Known Allergies     VITALS:  Blood pressure 96/74, pulse 69, temperature 98.2 F (36.8 C), temperature source Oral, resp. rate 24, height 5\' 6"  (1.676 m), weight 84.142 kg (185 lb 8 oz), SpO2 94 %.   Filed Vitals:   10/28/14 0000 10/28/14 0323 10/28/14 0400 10/28/14 0800  BP:      Pulse:      Temp:      TempSrc:      Resp: 20  17 24   Height:      Weight:  84.142 kg (185 lb 8 oz)    SpO2: 96%  95% 94%    PHYSICAL EXAMINATION:  Mechele Claude male in no distress. Alert and oriented. Abdomen is soft. Ileostomy is functioning with liquid effluent. Wound demonstrates mild drainage. Penrose drain is intact. No cellulitis is noted.   Laboratory values from last several days were noted. Hemoglobin remained stable 11.7.  I discussed the implications of the pathology report with the patient and her wife.   ASSESSMENT AND PLAN:   Overall making slow progress postoperative day #5.  I will discontinue his PCA pump SCDs. We will work on rehabilitation transfer later this week in advance his diet to regular.  The nurse was instructed they could access this Port-A-Cath.

## 2014-10-28 NOTE — Clinical Social Work Note (Signed)
Pt is making improvements and PT is now recommending home health PT.  CSW signing off as there are no further needs at this time.   [place], [place]

## 2014-10-28 NOTE — Care Management (Signed)
Spoke with patient and his spouse [name] [name].  Spouse stated that she was anticipating discharge to SNF and I explained that patient recommendation was changed form SNF to home with Home Health this weekend. Patient was very quiet and deferred to spouse.  Will need DME walker upon discharge. Spouse stated that she would be home 4 days a week and that she could arrange some one to look in on him when she was at work. I explained that patient had ambulated sufficiently with PT and was deemed to not meet for SNF.  Gave copy  Of provider list for Home Health agencies. Continue to follow.

## 2014-10-29 ENCOUNTER — Inpatient Hospital Stay: Payer: Managed Care, Other (non HMO) | Attending: Hematology and Oncology | Admitting: Hematology and Oncology

## 2014-10-29 ENCOUNTER — Ambulatory Visit: Payer: Managed Care, Other (non HMO) | Admitting: Hematology and Oncology

## 2014-10-29 LAB — CBC
HCT: 35.8 % — ABNORMAL LOW (ref 40.0–52.0)
HEMOGLOBIN: 11.2 g/dL — AB (ref 13.0–18.0)
MCH: 24.9 pg — AB (ref 26.0–34.0)
MCHC: 31.1 g/dL — ABNORMAL LOW (ref 32.0–36.0)
MCV: 80 fL (ref 80.0–100.0)
Platelets: 330 10*3/uL (ref 150–440)
RBC: 4.48 MIL/uL (ref 4.40–5.90)
RDW: 17.5 % — ABNORMAL HIGH (ref 11.5–14.5)
WBC: 7.6 10*3/uL (ref 3.8–10.6)

## 2014-10-29 LAB — BASIC METABOLIC PANEL
Anion gap: 10 (ref 5–15)
BUN: 41 mg/dL — ABNORMAL HIGH (ref 6–20)
CALCIUM: 9 mg/dL (ref 8.9–10.3)
CHLORIDE: 102 mmol/L (ref 101–111)
CO2: 22 mmol/L (ref 22–32)
CREATININE: 1.93 mg/dL — AB (ref 0.61–1.24)
GFR calc non Af Amer: 36 mL/min — ABNORMAL LOW (ref >60)
GFR, EST AFRICAN AMERICAN: 42 mL/min — AB (ref >60)
GLUCOSE: 120 mg/dL — AB (ref 65–99)
POTASSIUM: 4.5 mmol/L (ref 3.5–5.1)
Sodium: 134 mmol/L — ABNORMAL LOW (ref 135–145)

## 2014-10-29 MED ORDER — ONDANSETRON HCL 4 MG/2ML IJ SOLN
4.0000 mg | Freq: Four times a day (QID) | INTRAMUSCULAR | Status: DC | PRN
  Administered 2014-10-29: 4 mg via INTRAVENOUS
  Filled 2014-10-29: qty 2

## 2014-10-29 MED ORDER — SODIUM CHLORIDE 0.9 % IJ SOLN
10.0000 mL | Freq: Two times a day (BID) | INTRAMUSCULAR | Status: DC
  Administered 2014-10-29 – 2014-10-30 (×2): 10 mL via INTRAVENOUS

## 2014-10-29 MED ORDER — SODIUM CHLORIDE 0.9 % IV BOLUS (SEPSIS)
1000.0000 mL | Freq: Once | INTRAVENOUS | Status: AC
  Administered 2014-10-29: 1000 mL via INTRAVENOUS

## 2014-10-29 NOTE — Progress Notes (Signed)
Dr. Rexene Edison notified of pt having n/v. MD ordered zofran q6h prn. Asked to continue to monitor pt and to call him back if pt persists on vomiting for a possible NGT placement. Will cont to monitor.

## 2014-10-29 NOTE — Progress Notes (Signed)
Surgery  POD 6  He had one episode of nausea and vomiting last evening. Currently he feels washed out. Ileostomy continues to function.  Filed Vitals:   10/28/14 1634 10/29/14 0016 10/29/14 0500 10/29/14 0821  BP: 108/76 92/68  110/69  Pulse: 65 62  67  Temp: 98.5 F (36.9 C) 98.1 F (36.7 C)  98.5 F (36.9 C)  TempSrc: Oral Oral  Oral  Resp:  18    Height:      Weight:   87 kg (191 lb 12.8 oz)   SpO2: 98% 96%  100%   CBC Latest Ref Rng 10/29/2014 10/27/2014 10/26/2014  WBC 3.8 - 10.6 K/uL 7.6 8.1 8.4  Hemoglobin 13.0 - 18.0 g/dL 11.2(L) 11.7(L) 10.4(L)  Hematocrit 40.0 - 52.0 % 35.8(L) 36.4(L) 33.6(L)  Platelets 150 - 440 K/uL 330 301 235    CMP Latest Ref Rng 10/29/2014 10/26/2014 10/24/2014  Glucose 65 - 99 mg/dL 120(H) 109(H) 138(H)  BUN 6 - 20 mg/dL 41(H) 11 15  Creatinine 0.61 - 1.24 mg/dL 1.93(H) 1.13 1.25(H)  Sodium 135 - 145 mmol/L 134(L) 138 140  Potassium 3.5 - 5.1 mmol/L 4.5 3.9 4.1  Chloride 101 - 111 mmol/L 102 104 111  CO2 22 - 32 mmol/L 22 26 23   Calcium 8.9 - 10.3 mg/dL 9.0 8.9 8.4(L)  Total Protein 6.5 - 8.1 g/dL - - 5.6(L)  Total Bilirubin 0.3 - 1.2 mg/dL - - 0.3  Alkaline Phos 38 - 126 U/L - - 62  AST 15 - 41 U/L - - 15  ALT 17 - 63 U/L - - 12(L)   In 760, uop 825, ostomy 625 liquid    PE: Lethargic appearing black male in no obvious distress wound is clean. Drain is in place. Ostomy demonstrates liquid effluent.  Labs as above  IMP  mild dehydration secondary to GI losses. With slightly elevated BUN and creatinine compared to the ninth.  Plan:  I will increase his IV fluids and give him an extra normal saline bolus. Not think that he requires Imodium at this time. Plan for removal of Foley catheter on postoperative day #7. I will remove his Penrose drain at that time as well. I will discuss with case management regarding rehabilitation placement upon discharge.

## 2014-10-29 NOTE — Consult Note (Signed)
WOC ostomy follow up Stoma type/location: RLQ Ileostomy Treatment options for stomal/peristomal skin: Barrier ring Output High output, liquid brown stool Ostomy pouching: 2pc. 2 1/4 plus a barrier ring  Education provided: Patient is very fatigued and apathetic during visit today.Marland Kitchen Has been given a bolus of IV hydration.  Wife is not at bedside.  Pouch is intact and patient wishes for me to return when wife is present.  Had brief discussion regarding discharge and that he thinks he will be going to SNF because he is too weak to go home.  Will return when patient is stronger and wife at bedside.   Enrolled patient in [place] Start Discharge program: Yes [place] team will continue to follow.  Domenic Moras RN BSN [place] Pager [phone]

## 2014-10-29 NOTE — Progress Notes (Signed)
Physical Therapy Treatment Patient Details Name: [name] [name]. MRN: [id number] DOB: 1956-04-14 Today's Date: 10/29/2014    History of Present Illness Patient is a very pleasant 59 y/o male that presents s/p lower anterior bowel resection, urethral stent, and ex-lap (POD#2). Patient has a past medical history of rectal cancer.     PT Comments    Pt demonstrates improvement in ambulation distance today. He is able to complete stairs safely. Pt is unable to finish exercises at EOB due to lethargy and increase in abdominal pain. Pt and family encouraged further ambulation with staff this evening. Pt will benefit from skilled PT services to address deficits in strength, balance, and mobility in order to return to full function at home.    Follow Up Recommendations  Home health PT     Equipment Recommendations  Rolling walker with 5" wheels    Recommendations for Other Services       Precautions / Restrictions Precautions Precautions: Fall Restrictions Weight Bearing Restrictions: No    Mobility  Bed Mobility Overal bed mobility: Modified Independent Bed Mobility: Supine to Sit;Sit to Supine     Supine to sit: Modified independent (Device/Increase time);HOB elevated Sit to supine: Modified independent (Device/Increase time);HOB elevated   General bed mobility comments: Pt is lethargic and moves very slow with bed mobility but improved since yesterday. Reminded of log-rolling technique.   Transfers Overall transfer level: Modified independent Equipment used: Rolling walker (2 wheeled) Transfers: Sit to/from Stand Sit to Stand: Modified independent (Device/Increase time)         General transfer comment: Good recall of hand placement on seated surface, extra time needed for pain.  Ambulation/Gait Ambulation/Gait assistance: Supervision Ambulation Distance (Feet): 600 Feet Assistive device: Rolling walker (2 wheeled) Gait Pattern/deviations: Step-through  pattern Gait velocity: 10'=7.7 sec; 1.3 ft/sec Gait velocity interpretation: <1.8 ft/sec, indicative of risk for recurrent falls General Gait Details: Pt with forward trunk flexion requiring intermittent cues for upright posture. Pain and vitals monitored throughout. SaO2>90% for entirety of ambulation. Minimal change in HR. Pain increases from 3/10 at rest to 4/10 with ambulation and 6/10 following exercise. Gait speed improves throughout ambulation distance. Pt reports 6/10 on RPE. No increase in respiration rate noted. Two standing rest breaks provided during ambulation. Pt encouraged to ambulate again later this evening with staff.   Stairs Stairs: Yes Stairs assistance: Min guard Stair Management: Two rails Number of Stairs: 3 General stair comments: Close guarding with patient. Pt able to ascend/descend 3 stairs safely. Encouraged use of bilateral rails at this time. Pt performs slowly and lacks confidence but is able to complete safely.  Wheelchair Mobility    Modified Rankin (Stroke Patients Only)       Balance Overall balance assessment: Modified Independent   Sitting balance-Leahy Scale: Good       Standing balance-Leahy Scale: Fair                      Cognition Arousal/Alertness: Lethargic Behavior During Therapy: WFL for tasks assessed/performed Overall Cognitive Status: Within Functional Limits for tasks assessed                      Exercises General Exercises - Lower Extremity Straight Leg Raises: Strengthening;Both;15 reps;Standing Hip Flexion/Marching: Strengthening;Both;Standing;15 reps    General Comments General comments (skin integrity, edema, etc.): ileostomy not visualized      Pertinent Vitals/Pain Pain Assessment: 0-10 Pain Score: 3  (Increases to 4/10 with ambulation, 6/10 end of  session) Pain Location: Incision on abdomen Pain Intervention(s): Limited activity within patient's tolerance;Monitored during session    Home  Living                      Prior Function            PT Goals (current goals can now be found in the care plan section) Acute Rehab PT Goals Patient Stated Goal: Would like to return home safely when appropriate.  PT Goal Formulation: With patient/family Time For Goal Achievement: 11/08/14 Potential to Achieve Goals: Good Progress towards PT goals: Progressing toward goals    Frequency  Min 2X/week    PT Plan Current plan remains appropriate    Co-evaluation             End of Session Equipment Utilized During Treatment: Gait belt Activity Tolerance: Patient limited by pain (Pt had to stop exercises due to pain) Patient left: in bed;with call bell/phone within reach;with bed alarm set     Time: 1432-1500 PT Time Calculation (min) (ACUTE ONLY): 28 min  Charges:  $Gait Training: 8-22 mins $Therapeutic Exercise: 8-22 mins                    G Codes:      [name] [name] PT, DPT   [name],[name] [name] 10/29/2014, 3:19 PM

## 2014-10-30 LAB — BASIC METABOLIC PANEL
ANION GAP: 8 (ref 5–15)
BUN: 34 mg/dL — ABNORMAL HIGH (ref 6–20)
CO2: 21 mmol/L — AB (ref 22–32)
CREATININE: 1.6 mg/dL — AB (ref 0.61–1.24)
Calcium: 8.6 mg/dL — ABNORMAL LOW (ref 8.9–10.3)
Chloride: 103 mmol/L (ref 101–111)
GFR calc non Af Amer: 45 mL/min — ABNORMAL LOW (ref >60)
GFR, EST AFRICAN AMERICAN: 52 mL/min — AB (ref >60)
Glucose, Bld: 110 mg/dL — ABNORMAL HIGH (ref 65–99)
Potassium: 4 mmol/L (ref 3.5–5.1)
Sodium: 132 mmol/L — ABNORMAL LOW (ref 135–145)

## 2014-10-30 LAB — CBC
HCT: 33.7 % — ABNORMAL LOW (ref 40.0–52.0)
HEMOGLOBIN: 10.6 g/dL — AB (ref 13.0–18.0)
MCH: 25.1 pg — AB (ref 26.0–34.0)
MCHC: 31.5 g/dL — ABNORMAL LOW (ref 32.0–36.0)
MCV: 79.8 fL — AB (ref 80.0–100.0)
Platelets: 302 10*3/uL (ref 150–440)
RBC: 4.22 MIL/uL — AB (ref 4.40–5.90)
RDW: 17.3 % — ABNORMAL HIGH (ref 11.5–14.5)
WBC: 5 10*3/uL (ref 3.8–10.6)

## 2014-10-30 NOTE — Consult Note (Signed)
WOC ostomy follow up Stoma type/location: RLQ Ileostomy, dehydration Stomal assessment/size: 1 3/4" round pink and moist  Well budded, liquid brown stool present in pouch Peristomal assessment: creasing noted at 3 and 9 o'clock, barrier ring used. Retention rod has been removed.  Treatment options for stomal/peristomal skin: Barrier ring for skin creasing at 3 and 9 o'clock Output liquid brown stool Ostomy pouching: 2pc. 2 1/4" with barrier ring Education provided: Pouch change performed today with wife at bedside.  Wife is a Therapist, sports and appreciates the reinforcement, feeling more comfortable with pouch changes.  Barrier ring has stopped the leaking and she feels more secure now.  Skin is intact around stoma, creasing noted at 3 and 9 o'clock.  Patient able to cut out barrier and roll pouch closed. Supplies left in the room.  Discharge likely to home with [facility].   Enrolled patient in [place] Start Discharge program: Yes [place] team will continue to follow.  Domenic Moras RN BSN [place] Pager [phone]

## 2014-10-30 NOTE — Progress Notes (Signed)
7 Days Post-Op   Subjective:  He is relatively lethargic with no specific complaints. He has not vomited overnight. He has been eating relatively well. He remains moderately hypotensive with blood pressures in the high 90s and low 100s. His creatinine is better today at 1.6. He still appears to be relatively dehydrated.  Vital signs in last 24 hours: Temp:  [98.1 F (36.7 C)-98.7 F (37.1 C)] 98.7 F (37.1 C) (07/12 2359) Pulse Rate:  [57-67] 62 (07/12 2359) Resp:  [16-18] 16 (07/12 2359) BP: (88-118)/(54-76) 93/54 mmHg (07/12 2359) SpO2:  [98 %-100 %] 99 % (07/12 2359) Weight:  [83.915 kg (185 lb)] 83.915 kg (185 lb) (07/13 0540) Last BM Date: 10/28/14  Intake/Output from previous day: 07/12 0701 - 07/13 0700 In: 3470 [P.O.:240; I.V.:2342; IV Piggyback:888] Out: 3400 [Urine:2250; Stool:1150]  GI: Abdomen is soft nontender with no significant drainage. Penrose was removed. Ostomy is viable  Lab Results:  CBC  Recent Labs  10/29/14 0701 10/30/14 0534  WBC 7.6 5.0  HGB 11.2* 10.6*  HCT 35.8* 33.7*  PLT 330 302   CMP     Component Value Date/Time   NA 132* 10/30/2014 0534   NA 132* 08/12/2014 0858   K 4.0 10/30/2014 0534   K 4.0 08/12/2014 0858   CL 103 10/30/2014 0534   CL 100* 08/12/2014 0858   CO2 21* 10/30/2014 0534   CO2 24 08/12/2014 0858   GLUCOSE 110* 10/30/2014 0534   GLUCOSE 118* 08/12/2014 0858   BUN 34* 10/30/2014 0534   BUN 30* 08/12/2014 0858   CREATININE 1.60* 10/30/2014 0534   CREATININE 1.52* 08/12/2014 0858   CALCIUM 8.6* 10/30/2014 0534   CALCIUM 8.9 08/12/2014 0858   PROT 5.6* 10/24/2014 0500   PROT 7.1 08/12/2014 0858   ALBUMIN 3.1* 10/24/2014 0500   ALBUMIN 3.7 08/12/2014 0858   AST 15 10/24/2014 0500   AST 16 08/12/2014 0858   ALT 12* 10/24/2014 0500   ALT 15* 08/12/2014 0858   ALKPHOS 62 10/24/2014 0500   ALKPHOS 69 08/12/2014 0858   BILITOT 0.3 10/24/2014 0500   GFRNONAA 45* 10/30/2014 0534   GFRNONAA 49* 08/12/2014 0858   GFRAA 52* 10/30/2014 0534   GFRAA 57* 08/12/2014 0858   PT/INR No results for input(s): LABPROT, INR in the last 72 hours.  Studies/Results: No results found.  Assessment/Plan: Overall he appears to be progressing but does appear a bit behind on his fluids. I encouraged him to drink regularly. We will discontinue his Foley as it has been 1 week today. We'll repeat his labs again tomorrow. I have reviewed the physical therapy note we will arrange for outpatient physical therapy when he is ready for discharge.

## 2014-10-30 NOTE — Progress Notes (Signed)
Urology Consult Follow Up  Subjective: Foley removed this AM, has not yet voided.  No flank pain related to stents.    Anti-infectives: Anti-infectives    Start     Dose/Rate Route Frequency Ordered Stop   10/23/14 0245  ciprofloxacin (CIPRO) IVPB 400 mg     400 mg 200 mL/hr over 60 Minutes Intravenous  Once 10/23/14 0234 10/23/14 0817   10/23/14 0245  ertapenem (INVANZ) 1 g in sodium chloride 0.9 % 50 mL IVPB     1 g 100 mL/hr over 30 Minutes Intravenous  Once 10/23/14 0234 10/23/14 0752      Current Facility-Administered Medications  Medication Dose Route Frequency Provider Last Rate Last Dose  . aspirin EC tablet 81 mg  81 mg Oral Daily Sherri Rad, MD   81 mg at 10/29/14 4010  . cloNIDine (CATAPRES) tablet 0.2 mg  0.2 mg Oral Daily Sherri Rad, MD   0.2 mg at 10/29/14 0927  . colchicine tablet 0.6 mg  0.6 mg Oral Daily Sherri Rad, MD   0.6 mg at 10/29/14 0927  . dextrose 5 % and 0.45 % NaCl with KCl 20 mEq/L infusion   Intravenous Continuous Sherri Rad, MD 125 mL/hr at 10/30/14 2725    . enoxaparin (LOVENOX) injection 40 mg  40 mg Subcutaneous Q24H Sherri Rad, MD   40 mg at 10/29/14 1941  . lisinopril (PRINIVIL,ZESTRIL) tablet 20 mg  20 mg Oral Daily Sherri Rad, MD   20 mg at 10/29/14 3664   And  . hydrochlorothiazide (HYDRODIURIL) tablet 25 mg  25 mg Oral Daily Sherri Rad, MD   25 mg at 10/29/14 0927  . HYDROcodone-acetaminophen (NORCO/VICODIN) 5-325 MG per tablet 1 tablet  1 tablet Oral Q6H PRN Sherri Rad, MD   1 tablet at 10/29/14 1940  . morphine 2 MG/ML injection 2 mg  2 mg Intravenous Q2H PRN Sherri Rad, MD   2 mg at 10/30/14 0006  . ondansetron (ZOFRAN) injection 4 mg  4 mg Intravenous Q6H PRN Marlyce Huge, MD   4 mg at 10/29/14 0412  . sodium chloride 0.9 % injection 10 mL  10 mL Intravenous Q12H Sherri Rad, MD   10 mL at 10/29/14 2205  . sodium chloride 0.9 % injection 3 mL  3 mL Intravenous Q12H Sherri Rad, MD   3 mL at 10/29/14 2205   Facility-Administered Medications  Ordered in Other Encounters  Medication Dose Route Frequency Provider Last Rate Last Dose  . sodium chloride 0.9 % injection 10 mL  10 mL Intravenous PRN Dallas Schimke, MD   10 mL at 08/26/14 1300     Objective: Vital signs in last 24 hours: Temp:  [98.1 F (36.7 C)-98.7 F (37.1 C)] 98.6 F (37 C) (07/13 0750) Pulse Rate:  [57-62] 59 (07/13 0750) Resp:  [16-18] 18 (07/13 0750) BP: (88-99)/(54-69) 99/69 mmHg (07/13 0750) SpO2:  [98 %-100 %] 100 % (07/13 0750) Weight:  [185 lb (83.915 kg)] 185 lb (83.915 kg) (07/13 0540)  Intake/Output from previous day: 07/12 0701 - 07/13 0700 In: 3470 [P.O.:240; I.V.:2342; IV Piggyback:888] Out: 3400 [Urine:2250; Stool:1150] Intake/Output this shift: Total I/O In: -  Out: 225 [Urine:225]   Physical Exam  Constitutional: He is oriented to person, place, and time and well-developed, well-nourished, and in no distress.  HENT:  Head: Normocephalic and atraumatic.  Neck: Neck supple.  Cardiovascular: Normal rate and regular rhythm.   Abdominal: Soft. Bowel sounds are normal. He exhibits no distension. There is no tenderness.  Ostomy in place  Musculoskeletal: Normal range of motion.  Neurological: He is alert and oriented to person, place, and time.  Skin: Skin is warm and dry.  Psychiatric: Affect normal.    Lab Results:   Recent Labs  10/29/14 0701 10/30/14 0534  WBC 7.6 5.0  HGB 11.2* 10.6*  HCT 35.8* 33.7*  PLT 330 302   BMET  Recent Labs  10/29/14 0701 10/30/14 0534  NA 134* 132*  K 4.5 4.0  CL 102 103  CO2 22 21*  GLUCOSE 120* 110*  BUN 41* 34*  CREATININE 1.93* 1.60*  CALCIUM 9.0 8.6*    Assessment: POD 7 s/p Procedure(s): LOW ANTERIOR BOWEL RESECTION CYSTOSCOPY WITH STENT PLACEMENT,URETHRAL DILATION, LEFT RETROGRADE PYELOGRAM, URETEROSCOPY EXPLORATORY LAPAROTOMY DIVERTING ILEOSTOMY  Plan: Foley removed this AM, due to void later today  Patient advised will need follow up for left ureteral stent  removal upon discharge  Urology to sign off, discharge instruction placed in EPIC  Please page with any questions or concerns   LOS: 7 days    Hollice Espy 10/30/2014

## 2014-10-30 NOTE — Progress Notes (Signed)
Physical Therapy Treatment Patient Details Name: [name] MCBEE [name] MRN: [id number] DOB: [birth year] Today's Date: 24-Nov-2014    History of Present Illness Patient is a very pleasant 59 y/o male that presents s/p lower anterior bowel resection, urethral stent, and ex-lap (POD#2). Patient has a past medical history of rectal cancer.     PT Comments    Pt was up walking earlier and asked to do more limited therapy.  He did there ex at bed with PT and was attended by his family to observe and note for home.  Will anticipate his ability to follow up with this to be fairly good and HHPT can help as well.  Follow Up Recommendations  Home health PT     Equipment Recommendations  Rolling walker with 5" wheels    Recommendations for Other Services       Precautions / Restrictions Precautions Precautions: Fall Restrictions Weight Bearing Restrictions: No    Mobility  Bed Mobility Overal bed mobility: Modified Independent Bed Mobility:  (scooting up in bed)           General bed mobility comments: Mod I to control slidign up in bed using bedrails  Transfers                    Ambulation/Gait                 Stairs            Wheelchair Mobility    Modified Rankin (Stroke Patients Only)       Balance                                    Cognition Arousal/Alertness: Lethargic Behavior During Therapy: WFL for tasks assessed/performed Overall Cognitive Status: Within Functional Limits for tasks assessed                      Exercises General Exercises - Lower Extremity Ankle Circles/Pumps: AROM;Both;10 reps Quad Sets: AROM;Both;15 reps Gluteal Sets: AROM;Both;15 reps Heel Slides: AROM;Both;20 reps Hip ABduction/ADduction: AROM;Both;20 reps Hip Flexion/Marching: AROM;Both;10 reps    General Comments        Pertinent Vitals/Pain Pain Assessment: No/denies pain    Home Living                      Prior  Function            PT Goals (current goals can now be found in the care plan section) Acute Rehab PT Goals Patient Stated Goal: Home Progress towards PT goals: Progressing toward goals    Frequency  Min 2X/week    PT Plan Current plan remains appropriate    Co-evaluation             End of Session   Activity Tolerance: Patient tolerated treatment well;Patient limited by fatigue;Patient limited by lethargy Patient left: in bed;with call bell/phone within reach;with bed alarm set;with family/visitor present     Time: 3235-5732 PT Time Calculation (min) (ACUTE ONLY): 22 min  Charges:  $Therapeutic Exercise: 8-22 mins                    G Codes:      [name] [name] 2014-11-24, 6:08 PM  [name] [name], PT MS Acute Rehab Dept. Number: [facility] [id number] and [place] [phone]

## 2014-10-31 LAB — CREATININE, SERUM
Creatinine, Ser: 1.29 mg/dL — ABNORMAL HIGH (ref 0.61–1.24)
GFR calc non Af Amer: 59 mL/min — ABNORMAL LOW (ref >60)

## 2014-10-31 MED ORDER — HYDROCODONE-ACETAMINOPHEN 5-325 MG PO TABS: 1.0000 | ORAL_TABLET | Freq: Four times a day (QID) | ORAL | Status: DC | PRN

## 2014-10-31 MED ORDER — LISINOPRIL 20 MG PO TABS: 20.0000 mg | ORAL_TABLET | Freq: Every day | ORAL | Status: DC

## 2014-10-31 MED ORDER — HYDROCHLOROTHIAZIDE 25 MG PO TABS: 25.0000 mg | ORAL_TABLET | Freq: Every day | ORAL | Status: DC

## 2014-10-31 NOTE — Progress Notes (Signed)
Physical Therapy Treatment Patient Details Name: [name] WHITERS [name] MRN: [id number] DOB: May 25, 1955 Today's Date: 11-12-2014    History of Present Illness Patient is a very pleasant 59 y/o male that presents s/p lower anterior bowel resection, urethral stent, and ex-lap (POD#2). Patient has a past medical history of rectal cancer.     PT Comments    Pt was seen for evaluation of his strength and declined again to walk due to his being up today.  Has plan to leave with family tomorrow, and will expect HHPT to work with them.  Follow Up Recommendations  Home health PT     Equipment Recommendations  Rolling walker with 5" wheels    Recommendations for Other Services       Precautions / Restrictions Precautions Precautions: Fall Restrictions Weight Bearing Restrictions: No    Mobility  Bed Mobility Overal bed mobility: Modified Independent                Transfers                 General transfer comment: declined to get up  Ambulation/Gait         Gait velocity: declined       Stairs            Wheelchair Mobility    Modified Rankin (Stroke Patients Only)       Balance                                    Cognition Arousal/Alertness: Awake/alert Behavior During Therapy: WFL for tasks assessed/performed Overall Cognitive Status: Within Functional Limits for tasks assessed                      Exercises General Exercises - Lower Extremity Ankle Circles/Pumps: AROM;Both;10 reps Quad Sets: AROM;Both;20 reps Gluteal Sets: AROM;Both;10 reps Heel Slides: AROM;Both;20 reps Hip ABduction/ADduction: AROM;Both;20 reps Hip Flexion/Marching: AROM;Both;10 reps    General Comments General comments (skin integrity, edema, etc.): continues to have open abd wounds with ileostonmy      Pertinent Vitals/Pain Pain Assessment: 0-10 Pain Score: 3  Pain Location: abdomen Pain Intervention(s): Monitored during  session;Limited activity within patient's tolerance    Home Living                      Prior Function            PT Goals (current goals can now be found in the care plan section) Acute Rehab PT Goals Patient Stated Goal: Home Progress towards PT goals: Progressing toward goals    Frequency  Min 2X/week    PT Plan Current plan remains appropriate    Co-evaluation             End of Session   Activity Tolerance: Patient tolerated treatment well Patient left: in bed;with call bell/phone within reach;with bed alarm set     Time: 1351-1419 PT Time Calculation (min) (ACUTE ONLY): 28 min  Charges:  $Therapeutic Exercise: 8-22 mins $Therapeutic Activity: 8-22 mins                    G Codes:      [name] [name] 2014/11/12, 2:50 PM   [name] [name], PT MS Acute Rehab Dept. Number: [facility] [id number] and [place] [phone]

## 2014-10-31 NOTE — Progress Notes (Signed)
8 Days Post-Op   Subjective:  He feels better today than yesterday. He's had no further nausea and no further dizziness. Blood pressure is better over the last 12 hours on IV rehydration. His creatinine is back down to near normal levels at 1.29. He is voiding spontaneously but still has his stent in place.  Vital signs in last 24 hours: Temp:  [98.3 F (36.8 C)-98.6 F (37 C)] 98.3 F (36.8 C) (07/14 0136) Pulse Rate:  [58-64] 64 (07/14 0136) Resp:  [16-20] 20 (07/14 0136) BP: (92-135)/(56-76) 135/76 mmHg (07/14 0136) SpO2:  [98 %-100 %] 98 % (07/14 0136) Weight:  [83.643 kg (184 lb 6.4 oz)] 83.643 kg (184 lb 6.4 oz) (07/14 0300) Last BM Date: 10/28/14  Intake/Output from previous day: 07/13 0701 - 07/14 0700 In: 1680 [P.O.:480; I.V.:1200] Out: 2640 [Urine:2090; Stool:550]  GI: His wounds look good. The dressing was changed. He has good ostomy function.  Lab Results:  CBC  Recent Labs  10/29/14 0701 10/30/14 0534  WBC 7.6 5.0  HGB 11.2* 10.6*  HCT 35.8* 33.7*  PLT 330 302   CMP     Component Value Date/Time   NA 132* 10/30/2014 0534   NA 132* 08/12/2014 0858   K 4.0 10/30/2014 0534   K 4.0 08/12/2014 0858   CL 103 10/30/2014 0534   CL 100* 08/12/2014 0858   CO2 21* 10/30/2014 0534   CO2 24 08/12/2014 0858   GLUCOSE 110* 10/30/2014 0534   GLUCOSE 118* 08/12/2014 0858   BUN 34* 10/30/2014 0534   BUN 30* 08/12/2014 0858   CREATININE 1.29* 10/31/2014 0410   CREATININE 1.52* 08/12/2014 0858   CALCIUM 8.6* 10/30/2014 0534   CALCIUM 8.9 08/12/2014 0858   PROT 5.6* 10/24/2014 0500   PROT 7.1 08/12/2014 0858   ALBUMIN 3.1* 10/24/2014 0500   ALBUMIN 3.7 08/12/2014 0858   AST 15 10/24/2014 0500   AST 16 08/12/2014 0858   ALT 12* 10/24/2014 0500   ALT 15* 08/12/2014 0858   ALKPHOS 62 10/24/2014 0500   ALKPHOS 69 08/12/2014 0858   BILITOT 0.3 10/24/2014 0500   GFRNONAA 59* 10/31/2014 0410   GFRNONAA 49* 08/12/2014 0858   GFRAA >60 10/31/2014 0410   GFRAA 57*  08/12/2014 0858   PT/INR No results for input(s): LABPROT, INR in the last 72 hours.  Studies/Results: No results found.  Assessment/Plan: We will get him up and around more today to see this dizziness is improved with his hydration. I've spoken with his wife who feels couple taking him home rather than with rehabilitation. We will set him up for home health to assist with his ostomy. Tentatively plan discharge tomorrow

## 2014-11-01 NOTE — Progress Notes (Signed)
Physical Therapy Treatment Patient Details Name: [name] MOKRY [name] MRN: [id number] DOB: 12-05-1955 Today's Date: 11/01/2014    History of Present Illness Patient is a very pleasant 60 y/o male that presents s/p lower anterior bowel resection, urethral stent, and ex-lap (POD#2). Patient has a past medical history of rectal cancer.     PT Comments    Pt progressing towards goals and willing to participate in therapy this date. Ambulation distances have increased with RW. Pt remains mod I for bed mobility and transfers. Pt presented with low BP at start of session (reference ambulation section for vitals), however he stated no dizziness when asked by PT. Pt will still benefit from skilled PT in order to address his gait performance deficits, functional strength, and endurance impairments. RW was fitted to pt this session for appropriate height.   Follow Up Recommendations  Home health PT     Equipment Recommendations  Rolling walker with 5" wheels    Recommendations for Other Services       Precautions / Restrictions Precautions Precautions: Fall Precaution Comments: Abdominal incisions     Mobility  Bed Mobility Overal bed mobility: Modified Independent Bed Mobility: Supine to Sit;Sit to Supine     Supine to sit: Modified independent (Device/Increase time);HOB elevated Sit to supine: Modified independent (Device/Increase time);HOB elevated   General bed mobility comments: Pt continues to require bedrails to perform bed mobility and navigate laterally in the bed.   Transfers Overall transfer level: Modified independent Equipment used: Rolling walker (2 wheeled) Transfers: Sit to/from Stand Sit to Stand: Modified independent (Device/Increase time)         General transfer comment: Pt transfers with mod I. He demonstrates safe handplacement and good balance throughout transfers. Some weakness noted during standing, evident by a slight stall in his rising vertically.    Ambulation/Gait Ambulation/Gait assistance: Supervision Ambulation Distance (Feet): 660 Feet Assistive device: Rolling walker (2 wheeled) Gait Pattern/deviations: Step-through pattern;Decreased step length - right;Decreased step length - left;Trunk flexed Gait velocity: decreased Gait velocity interpretation: Below normal speed for age/gender General Gait Details: Pt ambulates with supervision for safety due to low blood pressure prior to ambulation (vitals in sitting pre-ambulation: BP = 93/67, HR = 65, O2 = 100 on room air). No complaints of lightheadedness in standing. He has decreased gait speed as well as a forward flexed trunk and requires verbal cues to walk upright. Pt reported no increase in pain after ambulation (vitals in sitting post-ambulation: BP = 96/72, HR = 66, O2 = 100 on room air)   Stairs            Wheelchair Mobility    Modified Rankin (Stroke Patients Only)       Balance Overall balance assessment: No apparent balance deficits (not formally assessed)                                  Cognition Arousal/Alertness: Awake/alert Behavior During Therapy: WFL for tasks assessed/performed Overall Cognitive Status: Within Functional Limits for tasks assessed                      Exercises General Exercises - Lower Extremity Ankle Circles/Pumps: AROM;Both;15 reps Quad Sets: AROM;Both;20 reps Gluteal Sets: AROM;Both;20 reps Long Arc Quad: AROM;Both;20 reps Heel Slides: AROM;Both;20 reps Hip ABduction/ADduction: AROM;Both;20 reps Hip Flexion/Marching: 20 reps    General Comments General comments (skin integrity, edema, etc.): Abdominal incision appeared of normal  color with no noted blood or discharge      Pertinent Vitals/Pain Pain Assessment: 0-10 Pain Score: 4  Pain Location: abdomen Pain Intervention(s): Limited activity within patient's tolerance;Monitored during session    Home Living                      Prior  Function            PT Goals (current goals can now be found in the care plan section) Acute Rehab PT Goals Patient Stated Goal: to go home PT Goal Formulation: With patient/family Time For Goal Achievement: 11/08/14 Potential to Achieve Goals: Good Progress towards PT goals: Progressing toward goals    Frequency  Min 2X/week    PT Plan Current plan remains appropriate    Co-evaluation             End of Session Equipment Utilized During Treatment: Gait belt (Place under armpits) Activity Tolerance: Patient tolerated treatment well Patient left: in bed;with call bell/phone within reach;with family/visitor present     Time: 2035-5974 PT Time Calculation (min) (ACUTE ONLY): 26 min  Charges:                       G CodesJanyth Contes Nov 04, 2014, 12:58 PM  Janyth Contes, SPT. [phone]

## 2014-11-01 NOTE — Discharge Instructions (Signed)
You have a ureteral stent in place.  This is a tube that extends from your kidney to your bladder.  This may cause urinary bleeding, burning with urination, and urinary frequency.  Please call our office or present to the ED if you develop fevers >101 or pain which is not able to be controlled with oral pain medications.  You may be given either Flomax and/ or ditropan to help with bladder spasms and stent pain in addition to pain medications.  YOUR STENT WILL NEED TO BE REMOVED.  [facility] [address] [place], [place] [address] [phone]  Do not drive on pain medications Do not lift greater than 15 lbs for a period of 6 weeks Call or return to ER if you develop fever greater than 101.5, nausea/vomiting, increased pain, redness/drainage from incisions Okay to shower with bandages on or after they come off, no tub baths

## 2014-11-01 NOTE — Care Management (Signed)
Contacted [name] [name] to answer patient questions about supplies for colostomy. [name] [name] called room and spoke with [name]. I was informed by the spouse that they would like to use [facility] for services. Referral placed with [name] [name] at advanced. [name] [name] has been delivered to room.

## 2014-11-01 NOTE — Progress Notes (Signed)
Surgery Progress Note  S: Doing well.  Min pain.  + ostomy output O:  Blood pressure 104/68, pulse 65, temperature 98.5 F (36.9 C), temperature source Oral, resp. rate 18, height 5\' 6"  (1.676 m), weight 83.008 kg (183 lb), SpO2 99 %. GEN: NAD/A&Ox3 ABD: soft, min tender, nondistended, incision c/d/i  A/P 59 yo s/p LAR with diverting ostomy - regular diet - PO pain meds

## 2014-11-01 NOTE — Consult Note (Signed)
WOC ostomy follow up Education provided:  Patient is prepared for discharge today.  HOme with HH. Patient has received secure start samples at home.  Wife is comfortable with pouch change procedure and being sent home with 3 pouches and 3 barriers.  Enrolled patient in [place] Start Discharge program: Yes Will not follow at this time.  Please re-consult if needed.  Domenic Moras RN BSN [place] Pager [phone]

## 2014-11-08 ENCOUNTER — Encounter: Payer: Self-pay | Admitting: Surgery

## 2014-11-10 NOTE — Discharge Summary (Signed)
Physician Discharge Summary  Patient ID: [name] BAUGHER [name] MRN: [id number] DOB/AGE: [birth year] 59 y.o.  Admit date: 10/23/2014 Discharge date: 11/01/2014.  Admission Diagnoses:  Low rectal cancer  Discharge Diagnoses:  Active Problems:   Rectal cancer   Discharged Condition: Stable and improved.  Hospital Course: The patient underwent a low anterior resection with a diverting loop ileostomy on July 6. He also underwent cystoscopy with left ureteral double-J stent placement by urology. On postoperative day #1 the patient had adequate pain control with intermittent PCA dosing. He had adequate urine output. On postoperative day #2 the patient continued to improve. The scope therapy did become involved. On postoperative day #4 the patient was complaining of some gas pains. He was tolerating some clear liquid diet. There was some moderate drainage from his wound which appeared be nonpurulent. Ostomy began functioning. I was able to be slowly advanced. On postoperative day #5 his PCA was discontinued. Hemoglobin was 11.7. Referral was made for case management for potential placement on discharge. Wound ostomy care did become involved. His bridge was dislodged rather early in his hospitalization however this did not become an issue. On postoperative day #6 patient had one episode of nausea and vomiting the night before his ileostomy continued to work. Nasogastric tube was not placed. His hemoglobin that there was 11.2 white count 7.6. On postoperative day #7 the patient was somewhat lethargic no further vomiting was noted. His creatinine did increase some and he was given some increased IV fluids because of very thin ileostomy output. By postoperative day #8 the patient had no further vomiting no further dizziness was feeling better and his creatinine was down to almost normal. He responded nicely to intravenous fluids. On postoperative day #9 the patient was tolerating a regular diet was  discharged home in stable condition in the care of his family.   Disposition: 01-Home or Self Care  Discharge Instructions    Change dressing (specify)    Complete by:  As directed   Dressing change with dry dressings as necessary     Diet - low sodium heart healthy    Complete by:  As directed      Driving Restrictions    Complete by:  As directed   Please do not drive while taking pain medicine     Increase activity slowly    Complete by:  As directed      Lifting restrictions    Complete by:  As directed   Do not lift any thing heavier than 10 pounds until your follow-up visit            Medication List    STOP taking these medications        bisacodyl 5 MG EC tablet  Generic drug:  bisacodyl     ciprofloxacin 500 MG tablet  Commonly known as:  CIPRO     oxyCODONE-acetaminophen 5-325 MG per tablet  Commonly known as:  PERCOCET/ROXICET     Polyethylene Glycol Powd      TAKE these medications        aspirin EC 81 MG tablet  Take by mouth.     cloNIDine 0.2 MG tablet  Commonly known as:  CATAPRES  Take 0.2 mg by mouth daily.     colchicine 0.6 MG tablet  Take 0.6 mg by mouth daily.     Fish Oil 1000 MG Caps  Take 1 capsule by mouth daily.     hydrochlorothiazide 25 MG tablet  Commonly known as:  HYDRODIURIL  Take 1 tablet (25 mg total) by mouth daily.     HYDROcodone-acetaminophen 5-325 MG per tablet  Commonly known as:  NORCO/VICODIN  Take 1 tablet by mouth every 6 (six) hours as needed for moderate pain.     Iron 325 (65 FE) MG Tabs  Take 1 tablet by mouth daily.     lisinopril 20 MG tablet  Commonly known as:  PRINIVIL,ZESTRIL  Take 1 tablet (20 mg total) by mouth daily.     lisinopril-hydrochlorothiazide 20-25 MG per tablet  Commonly known as:  PRINZIDE,ZESTORETIC  Take 1 tablet by mouth daily.     mirabegron ER 25 MG Tb24 tablet  Commonly known as:  MYRBETRIQ  Take 1 tablet (25 mg total) by mouth daily.     phenazopyridine 200 MG  tablet  Commonly known as:  PYRIDIUM  Take 200 mg by mouth 2 (two) times daily.     tamsulosin 0.4 MG Caps capsule  Commonly known as:  FLOMAX  Take 1 capsule (0.4 mg total) by mouth daily after supper.     vitamin E 400 UNIT capsule  Take 400 Units by mouth 2 (two) times daily.           Follow-up Information    Follow up with Hollice Espy, MD. Go on 11/20/2014.   Specialty:  Urology   Why:  Wednesday at 9:00am for cystoscopy/ stent removal   Contact information:   [place] [address] [phone]       Follow up with [facility]. Go on 11/13/2014.   Why:  Wednesday at 2:30pm with Dr. Burt Knack for suture removal, for wound re-check   Contact information:   [address] [place] [phone]      Signed: Sherri Rad   11/10/2014, 8:40 PM

## 2014-11-13 ENCOUNTER — Ambulatory Visit (INDEPENDENT_AMBULATORY_CARE_PROVIDER_SITE_OTHER): Payer: Managed Care, Other (non HMO) | Admitting: Surgery

## 2014-11-13 ENCOUNTER — Encounter: Payer: Self-pay | Admitting: Surgery

## 2014-11-13 VITALS — BP 144/83 | HR 62 | Temp 98.8°F | Ht 67.0 in | Wt 183.0 lb

## 2014-11-13 DIAGNOSIS — C20 Malignant neoplasm of rectum: Secondary | ICD-10-CM

## 2014-11-13 NOTE — Patient Instructions (Signed)
Follow-up in 2 weeks with Dr. Marina Gravel

## 2014-11-13 NOTE — Progress Notes (Signed)
Outpatient postop visit  11/13/2014  [name] Kehr [name] is an 59 y.o. male.    Procedure: LAR  CC: Rectal cancer  HPI: Patient status post chemoradiation who had a completion low anterior resection with protecting ileostomy by Dr. Marina Gravel.  Medications reviewed.    Physical Exam:  BP 144/83 mmHg  Pulse 62  Temp(Src) 98.8 F (37.1 C) (Oral)  Ht 5\' 7"  (1.702 m)  Wt 183 lb (83.008 kg)  BMI 28.66 kg/m2    PE: Abdomen is soft and nontender. Staples are been removed by the RN. Ileostomy is functional.    Assessment/Plan:  Patient doing very well pathology is personally reviewed. He has an appointment with oncology next week and I will have him see Dr. Marina Gravel in 2 weeks to discuss ileostomy closure at a later date. He requested and did not need any further pain medications.  Florene Glen, MD, FACS

## 2014-11-20 ENCOUNTER — Ambulatory Visit (INDEPENDENT_AMBULATORY_CARE_PROVIDER_SITE_OTHER): Payer: Managed Care, Other (non HMO) | Admitting: Urology

## 2014-11-20 ENCOUNTER — Encounter: Payer: Self-pay | Admitting: Urology

## 2014-11-20 VITALS — BP 124/78 | HR 65 | Ht 67.0 in | Wt 186.7 lb

## 2014-11-20 DIAGNOSIS — N359 Urethral stricture, unspecified: Secondary | ICD-10-CM

## 2014-11-20 DIAGNOSIS — N35919 Unspecified urethral stricture, male, unspecified site: Secondary | ICD-10-CM

## 2014-11-20 DIAGNOSIS — C20 Malignant neoplasm of rectum: Secondary | ICD-10-CM

## 2014-11-20 LAB — MICROSCOPIC EXAMINATION

## 2014-11-20 LAB — URINALYSIS, COMPLETE
Bilirubin, UA: NEGATIVE
GLUCOSE, UA: NEGATIVE
Ketones, UA: NEGATIVE
Nitrite, UA: NEGATIVE
Specific Gravity, UA: 1.025 (ref 1.005–1.030)
UUROB: 0.2 mg/dL (ref 0.2–1.0)
pH, UA: 5 (ref 5.0–7.5)

## 2014-11-20 MED ORDER — LIDOCAINE HCL 2 % EX GEL
1.0000 "application " | Freq: Once | CUTANEOUS | Status: AC
  Administered 2014-11-20: 1 via URETHRAL

## 2014-11-20 MED ORDER — GENTAMICIN SULFATE 40 MG/ML IJ SOLN
80.0000 mg | Freq: Once | INTRAMUSCULAR | Status: AC
  Administered 2014-11-20: 80 mg via INTRAMUSCULAR

## 2014-11-20 MED ORDER — CIPROFLOXACIN HCL 500 MG PO TABS
500.0000 mg | ORAL_TABLET | Freq: Once | ORAL | Status: AC
  Administered 2014-11-20: 500 mg via ORAL

## 2014-11-24 ENCOUNTER — Encounter: Payer: Self-pay | Admitting: Urology

## 2014-11-24 NOTE — Progress Notes (Signed)
11/20/2014 12:27 PM   [name] Kehr [name] November 02, 1955 [id number]  Referring provider: Sherri Rad, MD [address] [place], [place] [address]  Chief Complaint  Patient presents with  . Routine Post Op    CYSTOSCOPY WITH STENT PLACEMENTBilateral7/09/2014    HPI:  59 year old male with rectal cancer who was taken to the OR on 10/23/2014 by Dr. Marina Gravel for low anterior resection. I was asked to place preoperative stents for the purpose of ureteral identification.  Intraoperatively, he was found to have fairly significant bulbar urethral stricture which required urethral dilation to access the bladder.  He is also found to have left ureteral narrowing with J hooking of the distal ureter on that side.  The distal ureter was directly visualized using a rigid ureteroscope which showed no intraluminal tumors. Due to somewhat difficulty accessing the kidney, I did elect to go and place a double-J stent on the left. Foley catheter remained in place a week while in the hospital. He ultimately passed a successful voiding trial. He returns to the office today for cystoscopy, stent removal.  Note, the patient does now in retrospect recall a history of urethral stricture disease. He is undergone multiple serial dilations by Dr. Jeanette Caprice in the remote past.  Since discharge, he was initially voiding well but has noticed a narrowing in his urethral stream.   PMH: Past Medical History  Diagnosis Date  . Rectal cancer   . Cancer   . Iron deficiency anemia   . Pulmonary nodules   . Hypertension   . Hyperlipidemia   . Hematuria   . Colon cancer   . Myocardial infarction   . Ureter, stricture   . Gout     Surgical History: Past Surgical History  Procedure Laterality Date  . Colonoscopy 06/06/14    . Colonoscopy with esophagogastroduodenoscopy (egd)    . Portacath placement  07/01/2014    Dr. Marina Gravel  . Coronary angioplasty with stent placement  03/2004  . Bowel resection N/A 10/23/2014   Procedure: LOW ANTERIOR BOWEL RESECTION;  Surgeon: Sherri Rad, MD;  Location: [facility] ORS;  Service: General;  Laterality: N/A;  . Laparotomy N/A 10/23/2014    Procedure: EXPLORATORY LAPAROTOMY;  Surgeon: Sherri Rad, MD;  Location: [facility] ORS;  Service: General;  Laterality: N/A;  . Diverting ileostomy N/A 10/23/2014    Procedure: DIVERTING ILEOSTOMY;  Surgeon: Sherri Rad, MD;  Location: [facility] ORS;  Service: General;  Laterality: N/A;  . Cystoscopy with stent placement Bilateral 10/23/2014    Procedure: CYSTOSCOPY WITH STENT PLACEMENT,URETHRAL DILATION, LEFT RETROGRADE PYELOGRAM, URETEROSCOPY;  Surgeon: [name] Espy, MD;  Location: [facility] ORS;  Service: Urology;  Laterality: Bilateral;    Home Medications:    Medication List       This list is accurate as of: 11/20/14 11:59 PM.  Always use your most recent med list.               aspirin EC 81 MG tablet  Take by mouth.     cloNIDine 0.2 MG tablet  Commonly known as:  CATAPRES  Take 0.2 mg by mouth daily.     colchicine 0.6 MG tablet  Take 0.6 mg by mouth daily.     Fish Oil 1000 MG Caps  Take 1 capsule by mouth daily.     hydrochlorothiazide 25 MG tablet  Commonly known as:  HYDRODIURIL  Take 1 tablet (25 mg total) by mouth daily.     HYDROcodone-acetaminophen 5-325 MG per tablet  Commonly known as:  NORCO/VICODIN  Take 1 tablet by mouth every 6 (six) hours as needed for moderate pain.     Iron 325 (65 FE) MG Tabs  Take 1 tablet by mouth daily.     lisinopril 20 MG tablet  Commonly known as:  PRINIVIL,ZESTRIL  Take 1 tablet (20 mg total) by mouth daily.     lisinopril-hydrochlorothiazide 20-25 MG per tablet  Commonly known as:  PRINZIDE,ZESTORETIC  Take 1 tablet by mouth daily.     mirabegron ER 25 MG Tb24 tablet  Commonly known as:  MYRBETRIQ  Take 1 tablet (25 mg total) by mouth daily.     phenazopyridine 200 MG tablet  Commonly known as:  PYRIDIUM  Take 200 mg by mouth 2 (two) times daily.     tamsulosin 0.4 MG Caps  capsule  Commonly known as:  FLOMAX  Take 1 capsule (0.4 mg total) by mouth daily after supper.     vitamin E 400 UNIT capsule  Take 400 Units by mouth 2 (two) times daily.        Allergies:  Allergies  Allergen Reactions  . No Known Allergies     Family History: Family History  Problem Relation Age of Onset  . Hypertension Brother   . Hypertension Sister   . Hypertension Father   . Heart disease Paternal Grandfather   . Diabetes Father   . Diabetes Brother     Social History:  reports that he quit smoking about 30 years ago. He has never used smokeless tobacco. He reports that he does not drink alcohol or use illicit drugs.  Physical Exam: BP 124/78 mmHg  Pulse 65  Ht 5\' 7"  (1.702 m)  Wt 186 lb 11.2 oz (84.687 kg)  BMI 29.23 kg/m2  Constitutional:  Alert and oriented, No acute distress. HEENT: [place] AT, moist mucus membranes.  Trachea midline, no masses. Cardiovascular: No clubbing, cyanosis, or edema. Respiratory: Normal respiratory effort, no increased work of breathing. GI: Abdomen is soft, nontender, nondistended, no abdominal masses GU: No CVA tenderness. Normal phallus and orthotopic urethral meatus without meatal stenosis. Skin: No rashes, bruises or suspicious lesions. Lymph: No cervical or inguinal adenopathy. Neurologic: Grossly intact, no focal deficits, moving all 4 extremities. Psychiatric: Normal mood and affect.  Laboratory Data: Lab Results  Component Value Date   WBC 5.0 10/30/2014   HGB 10.6* 10/30/2014   HCT 33.7* 10/30/2014   MCV 79.8* 10/30/2014   PLT 302 10/30/2014    Lab Results  Component Value Date   CREATININE 1.29* 10/31/2014   Urinalysis Results for orders placed or performed in visit on 11/20/14  Microscopic Examination  Result Value Ref Range   WBC, UA >30W 0 -  5 /hpf   RBC, UA >30R 0 -  2 /hpf   Epithelial Cells (non renal) 0-10 0 - 10 /hpf   Renal Epithel, UA 0-10 (A) None seen /hpf   Casts Present (A) None seen /lpf     Cast Type Hyaline casts N/A   Bacteria, UA Few None seen/Few  Urinalysis, Complete  Result Value Ref Range   Specific Gravity, UA 1.025 1.005 - 1.030   pH, UA 5.0 5.0 - 7.5   Color, UA Yellow Yellow   Appearance Ur Cloudy (A) Clear   Leukocytes, UA 1+ (A) Negative   Protein, UA 2+ (A) Negative/Trace   Glucose, UA Negative Negative   Ketones, UA Negative Negative   RBC, UA 3+ (A) Negative   Bilirubin, UA Negative Negative   Urobilinogen, Ur  0.2 0.2 - 1.0 mg/dL   Nitrite, UA Negative Negative   Microscopic Examination See below:      Cystoscopy Procedure Note  Patient identification was confirmed, informed consent was obtained, and patient was prepped using Betadine solution.  Lidocaine jelly was administered per urethral meatus.    Preoperative abx where received prior to procedure.  Additional dose of gentamicin IM was given following the procedure due to fairly extensive manipulation.   Pre-Procedure: - Inspection reveals a normal caliber ureteral meatus.  Procedure: The flexible cystoscope was introduced without difficulty into the meatus. Recurrent narrowing of the bulbar urethra was noticed, proximally 12 French in diameter, unable to pass the scope through this area. Sensor wire was then advanced through the narrowed lumen into the bladder. The scope was removed and barred urethral serial dilators were used up to 14 [place] over the wire to dilate the urethra. Scope was then able today passed alongside the wire into the bladder where the left ureteral stent was seen. The distal coil of the stent was grasped using stent graspers and brought out in entirety through the urethra.  A 16 French [place] tip Foley catheter was then advanced over the wire into the bladder with return of clear urine. The wire was removed and the balloon was inflated with 10 cc of sterile water.  Post-Procedure: - Patient tolerated the procedure well   Assessment & Plan:  59 year old male with  a history of rectal cancer status post LAR found have an incidental left distal ureteral narrowing without any intraluminal tumors status post stent placement intraoperatively. Incidental bulbar urethral stricture noted but on retrospect he's been fairly symptomatic. Recurrence noted today requiring urethral dilation to access bladder.    1. Rectal cancer S/p left ureteral stent removal following intraop placement - Urinalysis, Complete - ciprofloxacin (CIPRO) tablet 500 mg; Take 1 tablet (500 mg total) by mouth once. - lidocaine (XYLOCAINE) 2 % jelly 1 application; Place 1 application into the urethra once.  2. Stricture of male urethra, unspecified stricture type Recurrent bulbar urethral stricture requiring dilation again today Recommend Foley catheter x 1 week with voiding trial CIC teaching with daily self cath to keep urethral patient Suspect patient may need for more formal reconstruction in the future - gentamicin (GARAMYCIN) injection 80 mg; Inject 2 mLs (80 mg total) into the muscle once.   Return for 1 week nurse visit for VT/ CIC teaching, 6 weeks for Uroflow/ PVR.  [name] Espy, MD  [facility] [address], [place], [place] [address] [phone]

## 2014-11-26 ENCOUNTER — Encounter: Payer: Self-pay | Admitting: Surgery

## 2014-11-26 ENCOUNTER — Ambulatory Visit (INDEPENDENT_AMBULATORY_CARE_PROVIDER_SITE_OTHER): Payer: Managed Care, Other (non HMO) | Admitting: Surgery

## 2014-11-26 VITALS — BP 133/80 | HR 60 | Temp 98.6°F | Ht 67.0 in | Wt 190.6 lb

## 2014-11-26 DIAGNOSIS — C20 Malignant neoplasm of rectum: Secondary | ICD-10-CM

## 2014-11-26 MED ORDER — HYDROCODONE-ACETAMINOPHEN 10-325 MG PO TABS: 1.0000 | ORAL_TABLET | Freq: Four times a day (QID) | ORAL | Status: DC | PRN

## 2014-11-26 NOTE — Patient Instructions (Signed)
Continue your follow-up with Dr. Mike Gip on 11/28/14. Once we see what her recommendations are we will discuss taking your Ileostomy down once all of your chemotherapy is complete.  Please call us with any questions or concerns.

## 2014-11-27 ENCOUNTER — Telehealth: Payer: Self-pay

## 2014-11-27 ENCOUNTER — Ambulatory Visit (INDEPENDENT_AMBULATORY_CARE_PROVIDER_SITE_OTHER): Payer: Managed Care, Other (non HMO)

## 2014-11-27 DIAGNOSIS — N35919 Unspecified urethral stricture, male, unspecified site: Secondary | ICD-10-CM

## 2014-11-27 DIAGNOSIS — N359 Urethral stricture, unspecified: Secondary | ICD-10-CM

## 2014-11-27 NOTE — Progress Notes (Signed)
   Surgical clinic.  Mr. Tanori is now 1 month status post low anterior resection with diverting loop ileostomy. He had 0 of 12 lymph nodes positive.  He is doing well. His ileostomy is working nicely. He is to see oncology on Thursday.  His goal examination demonstrate a well-healed midline incision. Ostomy is functioning. Rectal examination demonstrated no evidence of stenosis.  Impression T3 N0 cancer.  I would like to discuss with his oncologist ostomy reversal and the timing of which with future chemotherapy. I explained this to the family. I would like to wait at least 8 weeks from the time of surgery.  I will see him back for preop visit.

## 2014-11-27 NOTE — Progress Notes (Signed)
Fill and Pull Catheter Removal  Patient is present today for a catheter removal.  Patient was cleaned and prepped in a sterile fashion 147ml of sterile water/ saline was instilled into the bladder when the patient felt the urge to urinate. 24ml of water was then drained from the balloon.  A 16FR foley cath was removed from the bladder complications were noted as: severe bladder spasms. .  Patient as then given some time to void on their own.  Patient can void  147ml on their own after some time.  Patient tolerated well.  Preformed by: Toniann Fail, LPN  Follow up/ Additional notes: Pt was taught CIC to perform once daily. Pt was very uncomfortable with performing CIC. Nurse reassured pt and pt performed CIC well. Pt voiced understanding of CIC.

## 2014-11-27 NOTE — Telephone Encounter (Signed)
Spoke with pt and made aware CIC will need to be done once a day, need for 6wk f/u, and a script for catheters was sent to [facility]. Pt voiced understanding and was transferred to the front to make f/u appt.

## 2014-11-28 ENCOUNTER — Inpatient Hospital Stay: Payer: Managed Care, Other (non HMO) | Attending: Hematology and Oncology | Admitting: Hematology and Oncology

## 2014-11-28 ENCOUNTER — Other Ambulatory Visit: Payer: Self-pay | Admitting: Hematology and Oncology

## 2014-11-28 VITALS — BP 147/88 | HR 63 | Temp 95.9°F | Resp 18 | Ht 70.0 in | Wt 188.9 lb

## 2014-11-28 DIAGNOSIS — Z9221 Personal history of antineoplastic chemotherapy: Secondary | ICD-10-CM

## 2014-11-28 DIAGNOSIS — Z79899 Other long term (current) drug therapy: Secondary | ICD-10-CM

## 2014-11-28 DIAGNOSIS — Z923 Personal history of irradiation: Secondary | ICD-10-CM | POA: Insufficient documentation

## 2014-11-28 DIAGNOSIS — I739 Peripheral vascular disease, unspecified: Secondary | ICD-10-CM | POA: Diagnosis not present

## 2014-11-28 DIAGNOSIS — Z87891 Personal history of nicotine dependence: Secondary | ICD-10-CM | POA: Diagnosis not present

## 2014-11-28 DIAGNOSIS — I10 Essential (primary) hypertension: Secondary | ICD-10-CM | POA: Insufficient documentation

## 2014-11-28 DIAGNOSIS — C20 Malignant neoplasm of rectum: Secondary | ICD-10-CM | POA: Diagnosis present

## 2014-11-28 DIAGNOSIS — I252 Old myocardial infarction: Secondary | ICD-10-CM | POA: Insufficient documentation

## 2014-11-28 DIAGNOSIS — Z85038 Personal history of other malignant neoplasm of large intestine: Secondary | ICD-10-CM

## 2014-11-28 NOTE — Progress Notes (Signed)
Pt reports now having to do I&O caths once daily related to stricture.  Pt reports pain in penis area because of I&O cath

## 2014-12-02 ENCOUNTER — Telehealth: Payer: Self-pay | Admitting: Surgery

## 2014-12-09 ENCOUNTER — Other Ambulatory Visit: Payer: Self-pay

## 2014-12-09 DIAGNOSIS — C20 Malignant neoplasm of rectum: Secondary | ICD-10-CM

## 2014-12-12 ENCOUNTER — Inpatient Hospital Stay: Payer: Managed Care, Other (non HMO)

## 2014-12-12 ENCOUNTER — Inpatient Hospital Stay: Payer: Managed Care, Other (non HMO) | Admitting: Hematology and Oncology

## 2014-12-13 NOTE — Telephone Encounter (Signed)
I dont know how to end or close this encounter.

## 2014-12-19 ENCOUNTER — Other Ambulatory Visit: Payer: Self-pay | Admitting: Hematology and Oncology

## 2014-12-19 ENCOUNTER — Inpatient Hospital Stay: Payer: 59 | Attending: Hematology and Oncology

## 2014-12-19 DIAGNOSIS — I252 Old myocardial infarction: Secondary | ICD-10-CM | POA: Diagnosis not present

## 2014-12-19 DIAGNOSIS — Z923 Personal history of irradiation: Secondary | ICD-10-CM | POA: Diagnosis not present

## 2014-12-19 DIAGNOSIS — D509 Iron deficiency anemia, unspecified: Secondary | ICD-10-CM | POA: Diagnosis not present

## 2014-12-19 DIAGNOSIS — Z87891 Personal history of nicotine dependence: Secondary | ICD-10-CM | POA: Diagnosis not present

## 2014-12-19 DIAGNOSIS — E785 Hyperlipidemia, unspecified: Secondary | ICD-10-CM | POA: Diagnosis not present

## 2014-12-19 DIAGNOSIS — Z79899 Other long term (current) drug therapy: Secondary | ICD-10-CM | POA: Insufficient documentation

## 2014-12-19 DIAGNOSIS — I739 Peripheral vascular disease, unspecified: Secondary | ICD-10-CM | POA: Diagnosis not present

## 2014-12-19 DIAGNOSIS — Z7982 Long term (current) use of aspirin: Secondary | ICD-10-CM | POA: Diagnosis not present

## 2014-12-19 DIAGNOSIS — Z85038 Personal history of other malignant neoplasm of large intestine: Secondary | ICD-10-CM | POA: Insufficient documentation

## 2014-12-19 DIAGNOSIS — R5383 Other fatigue: Secondary | ICD-10-CM | POA: Diagnosis not present

## 2014-12-19 DIAGNOSIS — Z5111 Encounter for antineoplastic chemotherapy: Secondary | ICD-10-CM | POA: Insufficient documentation

## 2014-12-19 DIAGNOSIS — M109 Gout, unspecified: Secondary | ICD-10-CM | POA: Diagnosis not present

## 2014-12-19 DIAGNOSIS — C20 Malignant neoplasm of rectum: Secondary | ICD-10-CM

## 2014-12-19 DIAGNOSIS — I10 Essential (primary) hypertension: Secondary | ICD-10-CM | POA: Diagnosis not present

## 2014-12-19 LAB — CBC WITH DIFFERENTIAL/PLATELET
Basophils Absolute: 0 10*3/uL (ref 0–0.1)
Basophils Relative: 1 %
Eosinophils Absolute: 0.2 10*3/uL (ref 0–0.7)
Eosinophils Relative: 4 %
HCT: 34.2 % — ABNORMAL LOW (ref 40.0–52.0)
Hemoglobin: 10.8 g/dL — ABNORMAL LOW (ref 13.0–18.0)
Lymphocytes Relative: 6 %
Lymphs Abs: 0.3 10*3/uL — ABNORMAL LOW (ref 1.0–3.6)
MCH: 23 pg — ABNORMAL LOW (ref 26.0–34.0)
MCHC: 31.6 g/dL — ABNORMAL LOW (ref 32.0–36.0)
MCV: 72.7 fL — ABNORMAL LOW (ref 80.0–100.0)
Monocytes Absolute: 0.3 10*3/uL (ref 0.2–1.0)
Monocytes Relative: 6 %
Neutro Abs: 4.5 10*3/uL (ref 1.4–6.5)
Neutrophils Relative %: 83 %
Platelets: 285 10*3/uL (ref 150–440)
RBC: 4.7 MIL/uL (ref 4.40–5.90)
RDW: 17.1 % — ABNORMAL HIGH (ref 11.5–14.5)
WBC: 5.4 10*3/uL (ref 3.8–10.6)

## 2014-12-19 LAB — COMPREHENSIVE METABOLIC PANEL
ALT: 11 U/L — ABNORMAL LOW (ref 17–63)
AST: 16 U/L (ref 15–41)
Albumin: 3.8 g/dL (ref 3.5–5.0)
Alkaline Phosphatase: 79 U/L (ref 38–126)
Anion gap: 4 — ABNORMAL LOW (ref 5–15)
BUN: 18 mg/dL (ref 6–20)
CO2: 25 mmol/L (ref 22–32)
Calcium: 8.5 mg/dL — ABNORMAL LOW (ref 8.9–10.3)
Chloride: 108 mmol/L (ref 101–111)
Creatinine, Ser: 1.26 mg/dL — ABNORMAL HIGH (ref 0.61–1.24)
GFR calc Af Amer: >60 mL/min (ref >60)
GFR calc non Af Amer: 60 mL/min — ABNORMAL LOW (ref >60)
Glucose, Bld: 107 mg/dL — ABNORMAL HIGH (ref 65–99)
Potassium: 3.8 mmol/L (ref 3.5–5.1)
Sodium: 137 mmol/L (ref 135–145)
Total Bilirubin: 0.4 mg/dL (ref 0.3–1.2)
Total Protein: 6.8 g/dL (ref 6.5–8.1)

## 2014-12-19 LAB — MAGNESIUM: Magnesium: 1.7 mg/dL (ref 1.7–2.4)

## 2014-12-20 ENCOUNTER — Inpatient Hospital Stay: Payer: 59

## 2014-12-20 ENCOUNTER — Other Ambulatory Visit: Payer: Self-pay

## 2014-12-20 ENCOUNTER — Inpatient Hospital Stay (HOSPITAL_BASED_OUTPATIENT_CLINIC_OR_DEPARTMENT_OTHER): Payer: 59 | Admitting: Hematology and Oncology

## 2014-12-20 ENCOUNTER — Ambulatory Visit
Admission: RE | Admit: 2014-12-20 | Discharge: 2014-12-20 | Disposition: A | Discharge status: Home / Self Care | Payer: 59 | Source: Ambulatory Visit | Attending: Hematology and Oncology | Admitting: Hematology and Oncology

## 2014-12-20 ENCOUNTER — Other Ambulatory Visit: Payer: Self-pay | Admitting: Hematology and Oncology

## 2014-12-20 ENCOUNTER — Telehealth: Payer: Self-pay | Admitting: *Deleted

## 2014-12-20 VITALS — BP 136/82 | HR 60 | Temp 96.0°F | Wt 197.8 lb

## 2014-12-20 DIAGNOSIS — R5383 Other fatigue: Secondary | ICD-10-CM | POA: Diagnosis not present

## 2014-12-20 DIAGNOSIS — C20 Malignant neoplasm of rectum: Secondary | ICD-10-CM

## 2014-12-20 DIAGNOSIS — Z85038 Personal history of other malignant neoplasm of large intestine: Secondary | ICD-10-CM

## 2014-12-20 DIAGNOSIS — Z923 Personal history of irradiation: Secondary | ICD-10-CM

## 2014-12-20 DIAGNOSIS — Z5111 Encounter for antineoplastic chemotherapy: Secondary | ICD-10-CM | POA: Diagnosis not present

## 2014-12-20 DIAGNOSIS — I739 Peripheral vascular disease, unspecified: Secondary | ICD-10-CM

## 2014-12-20 DIAGNOSIS — Z79899 Other long term (current) drug therapy: Secondary | ICD-10-CM | POA: Diagnosis not present

## 2014-12-20 MED ORDER — SODIUM CHLORIDE 0.9 % IJ SOLN
10.0000 mL | INTRAMUSCULAR | Status: DC | PRN
  Filled 2014-12-20: qty 10

## 2014-12-20 MED ORDER — OXALIPLATIN CHEMO INJECTION 100 MG/20ML
85.0000 mg/m2 | Freq: Once | INTRAVENOUS | Status: AC
  Administered 2014-12-20: 175 mg via INTRAVENOUS
  Filled 2014-12-20: qty 35

## 2014-12-20 MED ORDER — ONDANSETRON HCL 8 MG PO TABS: 8.0000 mg | ORAL_TABLET | Freq: Three times a day (TID) | ORAL | Status: DC | PRN

## 2014-12-20 MED ORDER — DEXTROSE 5 % IV SOLN
Freq: Once | INTRAVENOUS | Status: DC
  Filled 2014-12-20: qty 1000

## 2014-12-20 MED ORDER — HEPARIN SOD (PORK) LOCK FLUSH 100 UNIT/ML IV SOLN: 500.0000 [IU] | Freq: Once | INTRAVENOUS | Status: DC | PRN

## 2014-12-20 MED ORDER — FLUOROURACIL CHEMO INJECTION 2.5 GM/50ML
400.0000 mg/m2 | Freq: Once | INTRAVENOUS | Status: DC
  Filled 2014-12-20: qty 16

## 2014-12-20 MED ORDER — SODIUM CHLORIDE 0.9 % IV SOLN
2400.0000 mg/m2 | INTRAVENOUS | Status: DC
  Filled 2014-12-20: qty 99

## 2014-12-20 MED ORDER — LEUCOVORIN CALCIUM INJECTION 350 MG
850.0000 mg | Freq: Once | INTRAVENOUS | Status: AC
  Administered 2014-12-20: 850 mg via INTRAVENOUS
  Filled 2014-12-20: qty 25

## 2014-12-20 MED ORDER — SODIUM CHLORIDE 0.9 % IV SOLN
Freq: Once | INTRAVENOUS | Status: AC
  Administered 2014-12-20: 11:00:00 via INTRAVENOUS
  Filled 2014-12-20: qty 4

## 2014-12-20 NOTE — Progress Notes (Signed)
Patient here for follow up regarding rectal ca.

## 2014-12-20 NOTE — Telephone Encounter (Signed)
Port should be OK to use for chemo, but there is a fibrin sheath and possible clot at the SVC junction at the end of the cath and they discussed this with the patient. This message was called to Dr Mike Gip at [phone]

## 2014-12-20 NOTE — Progress Notes (Signed)
1155- Pt C/O stinging in neck and back of neck upon starting chemotherapy.  Drugs stopped and notified MD.  Dye study ordered.

## 2014-12-23 ENCOUNTER — Encounter: Payer: Self-pay | Admitting: Hematology and Oncology

## 2014-12-23 NOTE — Progress Notes (Addendum)
[facility] day:  11/28/2014  Chief Complaint: [name] LIENAU [name] is a 59 y.o. male  with clinical stage IIA rectal cancer s/p neoadjuvant chemotherapy and radiation who is seen for assessment following low anterior resection.  HPI:  The patient was last seen in the medical oncology clinic on 10/04/2014.  At that time, he was seen for initial assessment. He had stage IIA rectal cancer and was status post sentinel adjuvant chemotherapy and radiation.  We discussed a surgery followed by adjuvant FOLFOX chemotherapy for 8 cycles.  He underwent low anterior resection on 10/23/2014 by Dr. Sherri Rad.  Pathology revealed a complete response. 10 lymph nodes were negative for malignancy.  Margins were negative.  Patient was seen in follow-up by Dr. Tacey Heap on 11/27/2014. At that time he was doing well. His ileostomy was working well.  He has continued to do well. He denies any complaint.  Past Medical History  Diagnosis Date  . Rectal cancer   . Cancer   . Iron deficiency anemia   . Pulmonary nodules   . Hypertension   . Hyperlipidemia   . Hematuria   . Colon cancer   . Myocardial infarction   . Ureter, stricture   . Gout     Past Surgical History  Procedure Laterality Date  . Colonoscopy 06/06/14    . Colonoscopy with esophagogastroduodenoscopy (egd)    . Portacath placement  07/01/2014    Dr. Marina Gravel  . Coronary angioplasty with stent placement  03/2004  . Bowel resection N/A 10/23/2014    Procedure: LOW ANTERIOR BOWEL RESECTION;  Surgeon: Sherri Rad, MD;  Location: [facility];  Service: General;  Laterality: N/A;  . Laparotomy N/A 10/23/2014    Procedure: EXPLORATORY LAPAROTOMY;  Surgeon: Sherri Rad, MD;  Location: [facility];  Service: General;  Laterality: N/A;  . Diverting ileostomy N/A 10/23/2014    Procedure: DIVERTING ILEOSTOMY;  Surgeon: Sherri Rad, MD;  Location: [facility];  Service: General;  Laterality: N/A;  . Cystoscopy with stent  placement Bilateral 10/23/2014    Procedure: CYSTOSCOPY WITH STENT PLACEMENT,URETHRAL DILATION, LEFT RETROGRADE PYELOGRAM, URETEROSCOPY;  Surgeon: Hollice Espy, MD;  Location: [facility];  Service: Urology;  Laterality: Bilateral;    Family History  Problem Relation Age of Onset  . Hypertension Brother   . Hypertension Sister   . Hypertension Father   . Heart disease Paternal Grandfather   . Diabetes Father   . Diabetes Brother     Social History:  reports that he quit smoking about 30 years ago. He has never used smokeless tobacco. He reports that he does not drink alcohol or use illicit drugs.  The patient is accompanied by his wife, Meredith Mody, today.  Allergies:  Allergies  Allergen Reactions  . No Known Allergies     Current Medications: Current Outpatient Prescriptions  Medication Sig Dispense Refill  . aspirin EC 81 MG tablet Take by mouth.    . colchicine 0.6 MG tablet Take 0.6 mg by mouth daily.    Marland Kitchen HYDROcodone-acetaminophen (NORCO) 10-325 MG per tablet Take 1 tablet by mouth every 6 (six) hours as needed. 45 tablet 0  . Omega-3 Fatty Acids (FISH OIL) 1000 MG CAPS Take 1 capsule by mouth daily.    . vitamin E 400 UNIT capsule Take 400 Units by mouth 2 (two) times daily.    . Ferrous Sulfate (IRON) 325 (65 FE) MG TABS Take 1 tablet by mouth daily.    . hydrochlorothiazide (  HYDRODIURIL) 25 MG tablet Take 1 tablet (25 mg total) by mouth daily. 30 tablet 5  . lisinopril (PRINIVIL,ZESTRIL) 20 MG tablet Take 1 tablet (20 mg total) by mouth daily. 30 tablet 5  . lisinopril-hydrochlorothiazide (PRINZIDE,ZESTORETIC) 20-25 MG per tablet Take 1 tablet by mouth daily.    . ondansetron (ZOFRAN) 8 MG tablet Take 1 tablet (8 mg total) by mouth every 8 (eight) hours as needed for nausea or vomiting. 20 tablet 0   No current facility-administered medications for this visit.   Facility-Administered Medications Ordered in Other Visits  Medication Dose Route Frequency Provider Last Rate Last  Dose  . sodium chloride 0.9 % injection 10 mL  10 mL Intravenous PRN Dallas Schimke, MD   10 mL at 08/26/14 1300    Review of Systems:  GENERAL:  Feels "ok".   No fevers, sweats.  Weight down 2 pounds. PERFORMANCE STATUS (ECOG):  1 HEENT:  No visual changes, runny nose, sore throat, mouth sores or tenderness. Lungs: No shortness of breath or cough.  No hemoptysis. Cardiac:  No chest pain, palpitations, orthopnea, or PND. GI:  Ostomy working well.  No nausea, vomiting, diarrhea, constipation, melena or hematochezia. GU:  No urgency, frequency, dysuria, and intermittent hematuria. Musculoskeletal:  No back pain.  No joint pain.  No muscle tenderness. Extremities:  No pain or swelling. Skin:  No rashes or skin changes. Neuro:  No headache, numbness or weakness, balance or coordination issues. Endocrine:  No diabetes, thyroid issues, hot flashes or night sweats. Psych:  No mood changes, depression or anxiety. Review of systems:  All other systems reviewed and found to be negative.   Physical Exam: Blood pressure 147/88, pulse 63, temperature 95.9 F (35.5 C), temperature source Tympanic, resp. rate 18, height 5\' 10"  (1.778 m), weight 188 lb 15 oz (85.7 kg).  GENERAL:  Well developed, well nourished, sitting comfortably in the exam room in no acute distress.  He has a cane at his side. MENTAL STATUS:  Alert and oriented to person, place and time. HEAD:  Brown hair.  Mustache.  Normocephalic, atraumatic, face symmetric, no Cushingoid features. EYES:  Brown eyes.  Pupils equal round and reactive to light and accomodation.  No conjunctivitis or scleral icterus. ENT:  Oropharynx clear without lesion.  Tongue normal. Mucous membranes moist.  RESPIRATORY:  Clear to auscultation without rales, wheezes or rhonchi. CARDIOVASCULAR:  Regular rate and rhythm without murmur, rub or gallop. ABDOMEN:  Ileostomy.  Soft, non-tender with active bowel sounds, and no hepatosplenomegaly.  No masses. RECTAL:   Hyperpigmentation secondary to radiation.  No skin breakdown. SKIN:  No rashes, ulcers or lesions. EXTREMITIES: No edema, no skin discoloration or tenderness.  No palpable cords. LYMPH NODES: No palpable cervical, supraclavicular, axillary or inguinal adenopathy  NEUROLOGICAL: Unremarkable. PSYCH:  Appropriate.   No visits with results within 3 Day(s) from this visit. Latest known visit with results is:  Procedure visit on 11/20/2014  Component Date Value Ref Range Status  . Specific Gravity, UA 11/20/2014 1.025  1.005 - 1.030 Final  . pH, UA 11/20/2014 5.0  5.0 - 7.5 Final  . Color, UA 11/20/2014 Yellow  Yellow Final  . Appearance Ur 11/20/2014 Cloudy* Clear Final  . Leukocytes, UA 11/20/2014 1+* Negative Final  . Protein, UA 11/20/2014 2+* Negative/Trace Final  . Glucose, UA 11/20/2014 Negative  Negative Final  . Ketones, UA 11/20/2014 Negative  Negative Final  . RBC, UA 11/20/2014 3+* Negative Final  . Bilirubin, UA 11/20/2014 Negative  Negative  Final  . Urobilinogen, Ur 11/20/2014 0.2  0.2 - 1.0 mg/dL Final  . Nitrite, UA 11/20/2014 Negative  Negative Final  . Microscopic Examination 11/20/2014 See below:   Final  . WBC, UA 11/20/2014 >30W  0 -  5 /hpf Final  . RBC, UA 11/20/2014 >30R  0 -  2 /hpf Final  . Epithelial Cells (non renal) 11/20/2014 0-10  0 - 10 /hpf Final  . Renal Epithel, UA 11/20/2014 0-10* None seen /hpf Final  . Casts 11/20/2014 Present* None seen /lpf Final  . Cast Type 11/20/2014 Hyaline casts  N/A Final   Granular casts  . Bacteria, UA 11/20/2014 Few  None seen/Few Final    Assessment:  [name] HORA [name] is a 59 y.o. male with clinical stage IIA (T3N0) rectal cancer.  Colonoscopy on 06/06/2014 revealed a large non-circumferential distal rectal mass. Biopsy was positive for adenocarcinoma.   CT scans revealed a 3 mm pulmonary nodule (indeterminate). There was a large rectal mass suspicious for transmural extension into the mesorectum.There was  equivocal perirectal and sigmoid mesocolon nodes. There was no liver metastasis or extrapelvic disease.  Endoscopic ultrasound at [facility] revealed a 70% circumferential mass at 9-14 cm from the anal verge and measuring 9 mm in maximal thickness. Ultrasound suggested breakthrough of the muscularis propria with invasion into the perirectal fat.  He received neoadjuvant chemotherapy (continuous infusion 5FU) and radiation. He received radiation from 07/03/2014 until 08/16/2014. He underwent low anterior resection with diverting loop ileostomy on 10/23/2014.  Pathology revealed a complete response.  Zero of 10 lymph nodes were positive.  He has significant peripheral vascular disease. A stent or bypass is being plannned.   Symptomatically, he is doing well.  He denies any complaint.  Plan: 1.  Review pathology.  Discuss plans for adjuvant chemotherapy (FOLFOX) for 8 cycles to complete 6 months of therapy.  Side effects of chemotherapy were re-reviewed. 2.  Preauth FOLFOX chemotherapy. 3.  RTC for MD assessment, labs (CBC with diff, CMP, Mg), and cycle #1 FOLFOX.   Lequita Asal, MD  11/28/2014

## 2014-12-23 NOTE — Progress Notes (Signed)
[name] day:  12/20/2014  Chief Complaint: [name] GODSHALL [name] is a 59 y.o. male  with clinical stage IIA rectal cancer s/p neoadjuvant chemotherapy and radiation followed by low anterior resection who is seen for assessment prior to cycle #1 FOLFOX chemotherapy.  HPI:  The patient was last seen in the medical oncology [name] on 11/28/2014.  At that time, he was seen for assessment following low anterior resection on 10/23/2014.  Pathology revealed a complete response. 10 lymph nodes were negative for malignancy.  We re-reviewed the plan for adjuvant chemotherapy (FOLFOX chemotherapy every 2 weeks x 8 cycles) to complete 6 months of peri-operative chemotherapy.  During the interim, he has done well. He feels somewhat fatigued and nauseated today. He notes no problems with diarrhea.   Past Medical History  Diagnosis Date  . Rectal cancer   . Cancer   . Iron deficiency anemia   . Pulmonary nodules   . Hypertension   . Hyperlipidemia   . Hematuria   . Colon cancer   . Myocardial infarction   . Ureter, stricture   . Gout     Past Surgical History  Procedure Laterality Date  . Colonoscopy 06/06/14    . Colonoscopy with esophagogastroduodenoscopy (egd)    . Portacath placement  07/01/2014    Dr. Marina Gravel  . Coronary angioplasty with stent placement  03/2004  . Bowel resection N/A 10/23/2014    Procedure: LOW ANTERIOR BOWEL RESECTION;  Surgeon: Sherri Rad, MD;  Location: [facility];  Service: General;  Laterality: N/A;  . Laparotomy N/A 10/23/2014    Procedure: EXPLORATORY LAPAROTOMY;  Surgeon: Sherri Rad, MD;  Location: [facility];  Service: General;  Laterality: N/A;  . Diverting ileostomy N/A 10/23/2014    Procedure: DIVERTING ILEOSTOMY;  Surgeon: Sherri Rad, MD;  Location: [facility];  Service: General;  Laterality: N/A;  . Cystoscopy with stent placement Bilateral 10/23/2014    Procedure: CYSTOSCOPY WITH STENT PLACEMENT,URETHRAL DILATION, LEFT  RETROGRADE PYELOGRAM, URETEROSCOPY;  Surgeon: Hollice Espy, MD;  Location: [facility];  Service: Urology;  Laterality: Bilateral;    Family History  Problem Relation Age of Onset  . Hypertension Brother   . Hypertension Sister   . Hypertension Father   . Heart disease Paternal Grandfather   . Diabetes Father   . Diabetes Brother     Social History:  reports that he quit smoking about 30 years ago. He has never used smokeless tobacco. He reports that he does not drink alcohol or use illicit drugs.  The patient is accompanied by his wife, Meredith Mody, today.  Allergies:  Allergies  Allergen Reactions  . No Known Allergies     Current Medications: Current Outpatient Prescriptions  Medication Sig Dispense Refill  . aspirin EC 81 MG tablet Take by mouth.    . colchicine 0.6 MG tablet Take 0.6 mg by mouth daily.    . Ferrous Sulfate (IRON) 325 (65 FE) MG TABS Take 1 tablet by mouth daily.    . hydrochlorothiazide (HYDRODIURIL) 25 MG tablet Take 1 tablet (25 mg total) by mouth daily. 30 tablet 5  . HYDROcodone-acetaminophen (NORCO) 10-325 MG per tablet Take 1 tablet by mouth every 6 (six) hours as needed. 45 tablet 0  . lisinopril (PRINIVIL,ZESTRIL) 20 MG tablet Take 1 tablet (20 mg total) by mouth daily. 30 tablet 5  . lisinopril-hydrochlorothiazide (PRINZIDE,ZESTORETIC) 20-25 MG per tablet Take 1 tablet by mouth daily.    . Omega-3 Fatty Acids (FISH OIL)  1000 MG CAPS Take 1 capsule by mouth daily.    . vitamin E 400 UNIT capsule Take 400 Units by mouth 2 (two) times daily.    . ondansetron (ZOFRAN) 8 MG tablet Take 1 tablet (8 mg total) by mouth every 8 (eight) hours as needed for nausea or vomiting. 20 tablet 0   No current facility-administered medications for this visit.   Facility-Administered Medications Ordered in Other Visits  Medication Dose Route Frequency Provider Last Rate Last Dose  . sodium chloride 0.9 % injection 10 mL  10 mL Intravenous PRN Dallas Schimke, MD   10 mL at  08/26/14 1300    Review of Systems:  GENERAL:  Feels fatigued.   No fevers, sweats.  Weight down 2 pounds. PERFORMANCE STATUS (ECOG):  1 HEENT:  No visual changes, runny nose, sore throat, mouth sores or tenderness. Lungs: No shortness of breath or cough.  No hemoptysis. Cardiac:  No chest pain, palpitations, orthopnea, or PND. GI:  Ostomy working well.  No nausea, vomiting, diarrhea, constipation, melena or hematochezia. GU:  No urgency, frequency, dysuria, and intermittent hematuria. Musculoskeletal:  No back pain.  No joint pain.  No muscle tenderness. Extremities:  No pain or swelling. Skin:  No rashes or skin changes. Neuro:  No headache, numbness or weakness, balance or coordination issues. Endocrine:  No diabetes, thyroid issues, hot flashes or night sweats. Psych:  No mood changes, depression or anxiety. Review of systems:  All other systems reviewed and found to be negative.   Physical Exam: Blood pressure 136/82, pulse 60, temperature 96 F (35.6 C), temperature source Tympanic, weight 197 lb 12 oz (89.7 kg).  GENERAL:  Well developed, well nourished, sitting comfortably in the exam room in no acute distress.  He has a cane at his side. MENTAL STATUS:  Alert and oriented to person, place and time. HEAD:  Wearing a cap.  Brown hair.  Mustache.  Normocephalic, atraumatic, face symmetric, no Cushingoid features. EYES:  Brown eyes.  Pupils equal round and reactive to light and accomodation.  No conjunctivitis or scleral icterus. ENT:  Oropharynx clear without lesion.  Tongue normal. Mucous membranes moist.  RESPIRATORY:  Clear to auscultation without rales, wheezes or rhonchi. CARDIOVASCULAR:  Regular rate and rhythm without murmur, rub or gallop. ABDOMEN:  Ileostomy.  Soft, non-tender with active bowel sounds, and no hepatosplenomegaly.  No masses. SKIN:  No rashes, ulcers or lesions. EXTREMITIES: No edema, no skin discoloration or tenderness.  No palpable cords. LYMPH NODES:  No palpable cervical, supraclavicular, axillary or inguinal adenopathy  NEUROLOGICAL: Unremarkable. PSYCH:  Appropriate.   Appointment on 12/19/2014  Component Date Value Ref Range Status  . WBC 12/19/2014 5.4  3.8 - 10.6 K/uL Final  . RBC 12/19/2014 4.70  4.40 - 5.90 MIL/uL Final  . Hemoglobin 12/19/2014 10.8* 13.0 - 18.0 g/dL Final  . HCT 12/19/2014 34.2* 40.0 - 52.0 % Final  . MCV 12/19/2014 72.7* 80.0 - 100.0 fL Final  . MCH 12/19/2014 23.0* 26.0 - 34.0 pg Final  . MCHC 12/19/2014 31.6* 32.0 - 36.0 g/dL Final  . RDW 12/19/2014 17.1* 11.5 - 14.5 % Final  . Platelets 12/19/2014 285  150 - 440 K/uL Final  . Neutrophils Relative % 12/19/2014 83   Final  . Neutro Abs 12/19/2014 4.5  1.4 - 6.5 K/uL Final  . Lymphocytes Relative 12/19/2014 6   Final  . Lymphs Abs 12/19/2014 0.3* 1.0 - 3.6 K/uL Final  . Monocytes Relative 12/19/2014 6   Final  .  Monocytes Absolute 12/19/2014 0.3  0.2 - 1.0 K/uL Final  . Eosinophils Relative 12/19/2014 4   Final  . Eosinophils Absolute 12/19/2014 0.2  0 - 0.7 K/uL Final  . Basophils Relative 12/19/2014 1   Final  . Basophils Absolute 12/19/2014 0.0  0 - 0.1 K/uL Final  . Sodium 12/19/2014 137  135 - 145 mmol/L Final  . Potassium 12/19/2014 3.8  3.5 - 5.1 mmol/L Final  . Chloride 12/19/2014 108  101 - 111 mmol/L Final  . CO2 12/19/2014 25  22 - 32 mmol/L Final  . Glucose, Bld 12/19/2014 107* 65 - 99 mg/dL Final  . BUN 12/19/2014 18  6 - 20 mg/dL Final  . Creatinine, Ser 12/19/2014 1.26* 0.61 - 1.24 mg/dL Final  . Calcium 12/19/2014 8.5* 8.9 - 10.3 mg/dL Final  . Total Protein 12/19/2014 6.8  6.5 - 8.1 g/dL Final  . Albumin 12/19/2014 3.8  3.5 - 5.0 g/dL Final  . AST 12/19/2014 16  15 - 41 U/L Final  . ALT 12/19/2014 11* 17 - 63 U/L Final  . Alkaline Phosphatase 12/19/2014 79  38 - 126 U/L Final  . Total Bilirubin 12/19/2014 0.4  0.3 - 1.2 mg/dL Final  . GFR calc non Af Amer 12/19/2014 60* >60 mL/min Final  . GFR calc Af Amer 12/19/2014 >60  >60  mL/min Final   Comment: (NOTE) The eGFR has been calculated using the CKD EPI equation. This calculation has not been validated in all clinical situations. eGFR's persistently <60 mL/min signify possible Chronic Kidney Disease.   . Anion gap 12/19/2014 4* 5 - 15 Final  . Magnesium 12/19/2014 1.7  1.7 - 2.4 mg/dL Final    Assessment:  SHADY BRADISH [name] is a 59 y.o. male with clinical stage IIA (T3N0) rectal cancer.  Colonoscopy on 06/06/2014 revealed a large non-circumferential distal rectal mass. Biopsy was positive for adenocarcinoma.   CT scans revealed a 3 mm pulmonary nodule (indeterminate). There was a large rectal mass suspicious for transmural extension into the mesorectum.There was equivocal perirectal and sigmoid mesocolon nodes. There was no liver metastasis or extrapelvic disease.  Endoscopic ultrasound at [facility] revealed a 70% circumferential mass at 9-14 cm from the anal verge and measuring 9 mm in maximal thickness. Ultrasound suggested breakthrough of the muscularis propria with invasion into the perirectal fat.  He received neoadjuvant chemotherapy (continuous infusion 5FU) and radiation. He received radiation from 07/03/2014 until 08/16/2014. He underwent low anterior resection with diverting loop ileostomy on 10/23/2014.  Pathology revealed a complete response.  Zero of 10 lymph nodes were positive.  He has significant peripheral vascular disease. A stent or bypass is being plannned.   Symptomatically, he is fatigued.  Exam is unremarkable.  Plan: 1.  Labs today:  CBC with diff, CMP, Mg. 2.  Re-review chemotherapy plan and potential side effects.  Patient consented to therapy. 3.  Cycle #1 FOLFOX chemotherapy today. 4.  RTC in 2 days for disconnect. 5.  RTC in 1 week for MD assessment and labs (CBC with diff, BMP). 6.  RTC on 09/19 for MD assessment, labs (CBC with diff, CMP, Mg), and cycle #2 FOLFOX.  Addendum:  Shortly after initiation of infusion,  patient complained of burning along the central line. Chemotherapy was stopped.  He was sent to radiology for a line dye study to ensure integrity of his central line.   Lequita Asal, MD  12/20/2014

## 2014-12-26 ENCOUNTER — Inpatient Hospital Stay: Payer: 59

## 2014-12-26 ENCOUNTER — Other Ambulatory Visit: Payer: Self-pay | Admitting: Hematology and Oncology

## 2014-12-26 DIAGNOSIS — C801 Malignant (primary) neoplasm, unspecified: Secondary | ICD-10-CM

## 2014-12-26 DIAGNOSIS — C20 Malignant neoplasm of rectum: Secondary | ICD-10-CM

## 2014-12-26 DIAGNOSIS — Z5111 Encounter for antineoplastic chemotherapy: Secondary | ICD-10-CM | POA: Diagnosis not present

## 2014-12-26 LAB — CBC WITH DIFFERENTIAL/PLATELET
Basophils Absolute: 0 10*3/uL (ref 0–0.1)
Basophils Relative: 1 %
Eosinophils Absolute: 0.2 10*3/uL (ref 0–0.7)
Eosinophils Relative: 3 %
HCT: 30.6 % — ABNORMAL LOW (ref 40.0–52.0)
Hemoglobin: 9.6 g/dL — ABNORMAL LOW (ref 13.0–18.0)
Lymphocytes Relative: 8 %
Lymphs Abs: 0.4 10*3/uL — ABNORMAL LOW (ref 1.0–3.6)
MCH: 22.8 pg — ABNORMAL LOW (ref 26.0–34.0)
MCHC: 31.4 g/dL — ABNORMAL LOW (ref 32.0–36.0)
MCV: 72.8 fL — ABNORMAL LOW (ref 80.0–100.0)
Monocytes Absolute: 0.4 10*3/uL (ref 0.2–1.0)
Monocytes Relative: 7 %
Neutro Abs: 4.1 10*3/uL (ref 1.4–6.5)
Neutrophils Relative %: 81 %
Platelets: 237 10*3/uL (ref 150–440)
RBC: 4.2 MIL/uL — ABNORMAL LOW (ref 4.40–5.90)
RDW: 17.4 % — ABNORMAL HIGH (ref 11.5–14.5)
WBC: 5 10*3/uL (ref 3.8–10.6)

## 2014-12-26 LAB — COMPREHENSIVE METABOLIC PANEL
ALT: 11 U/L — ABNORMAL LOW (ref 17–63)
AST: 18 U/L (ref 15–41)
Albumin: 3.6 g/dL (ref 3.5–5.0)
Alkaline Phosphatase: 71 U/L (ref 38–126)
Anion gap: 6 (ref 5–15)
BUN: 20 mg/dL (ref 6–20)
CO2: 23 mmol/L (ref 22–32)
Calcium: 8.9 mg/dL (ref 8.9–10.3)
Chloride: 111 mmol/L (ref 101–111)
Creatinine, Ser: 1.21 mg/dL (ref 0.61–1.24)
GFR calc Af Amer: >60 mL/min (ref >60)
GFR calc non Af Amer: >60 mL/min (ref >60)
Glucose, Bld: 141 mg/dL — ABNORMAL HIGH (ref 65–99)
Potassium: 3.8 mmol/L (ref 3.5–5.1)
Sodium: 140 mmol/L (ref 135–145)
Total Bilirubin: 0.2 mg/dL — ABNORMAL LOW (ref 0.3–1.2)
Total Protein: 6.4 g/dL — ABNORMAL LOW (ref 6.5–8.1)

## 2014-12-26 LAB — MAGNESIUM: Magnesium: 1.6 mg/dL — ABNORMAL LOW (ref 1.7–2.4)

## 2014-12-26 MED ORDER — HEPARIN SOD (PORK) LOCK FLUSH 100 UNIT/ML IV SOLN
500.0000 [IU] | Freq: Once | INTRAVENOUS | Status: AC
  Administered 2014-12-26: 500 [IU] via INTRAVENOUS

## 2014-12-26 MED ORDER — SODIUM CHLORIDE 0.9 % IJ SOLN
10.0000 mL | INTRAMUSCULAR | Status: DC | PRN
  Administered 2014-12-26: 10 mL via INTRAVENOUS
  Filled 2014-12-26: qty 10

## 2014-12-26 MED ORDER — HEPARIN SOD (PORK) LOCK FLUSH 100 UNIT/ML IV SOLN
INTRAVENOUS | Status: AC
  Filled 2014-12-26: qty 5

## 2014-12-26 NOTE — Progress Notes (Signed)
Patient scheduled today to have port declotted.  After accessing port, flushed well with good blood return,  Fluid challenge done per Dr. Kem Parkinson verbal order.  Patient had no discomfort. Patient deaccessed. Labs also drawn today per Dr. Kem Parkinson request since patient to have chemo. tx tomorrow.

## 2014-12-27 ENCOUNTER — Ambulatory Visit: Payer: 59 | Admitting: Hematology and Oncology

## 2014-12-27 ENCOUNTER — Inpatient Hospital Stay: Payer: 59

## 2014-12-27 ENCOUNTER — Other Ambulatory Visit: Payer: Self-pay | Admitting: Hematology and Oncology

## 2014-12-27 ENCOUNTER — Other Ambulatory Visit: Payer: 59

## 2014-12-27 ENCOUNTER — Ambulatory Visit: Payer: 59

## 2014-12-27 DIAGNOSIS — C20 Malignant neoplasm of rectum: Secondary | ICD-10-CM

## 2014-12-27 DIAGNOSIS — Z5111 Encounter for antineoplastic chemotherapy: Secondary | ICD-10-CM | POA: Diagnosis not present

## 2014-12-27 MED ORDER — SODIUM CHLORIDE 0.9 % IV SOLN
Freq: Once | INTRAVENOUS | Status: AC
  Administered 2014-12-27: 10:00:00 via INTRAVENOUS
  Filled 2014-12-27: qty 4

## 2014-12-27 MED ORDER — FLUOROURACIL CHEMO INJECTION 2.5 GM/50ML
400.0000 mg/m2 | Freq: Once | INTRAVENOUS | Status: AC
  Administered 2014-12-27: 800 mg via INTRAVENOUS
  Filled 2014-12-27: qty 16

## 2014-12-27 MED ORDER — HEPARIN SOD (PORK) LOCK FLUSH 100 UNIT/ML IV SOLN: 500.0000 [IU] | Freq: Once | INTRAVENOUS | Status: DC | PRN

## 2014-12-27 MED ORDER — SODIUM CHLORIDE 0.9 % IV SOLN
1.0000 g | Freq: Once | INTRAVENOUS | Status: AC
  Administered 2014-12-27: 1 g via INTRAVENOUS
  Filled 2014-12-27: qty 2

## 2014-12-27 MED ORDER — LEUCOVORIN CALCIUM INJECTION 350 MG
850.0000 mg | Freq: Once | INTRAVENOUS | Status: AC
  Administered 2014-12-27: 850 mg via INTRAVENOUS
  Filled 2014-12-27: qty 25

## 2014-12-27 MED ORDER — SODIUM CHLORIDE 0.9 % IJ SOLN
10.0000 mL | INTRAMUSCULAR | Status: DC | PRN
  Administered 2014-12-27: 10 mL
  Filled 2014-12-27: qty 10

## 2014-12-27 MED ORDER — DEXTROSE 5 % IV SOLN
Freq: Once | INTRAVENOUS | Status: AC
  Administered 2014-12-27: 09:00:00 via INTRAVENOUS
  Filled 2014-12-27: qty 1000

## 2014-12-27 MED ORDER — FLUOROURACIL CHEMO INJECTION 5 GM/100ML
2400.0000 mg/m2 | INTRAVENOUS | Status: DC
  Administered 2014-12-27: 4950 mg via INTRAVENOUS
  Filled 2014-12-27: qty 99

## 2014-12-27 MED ORDER — OXALIPLATIN CHEMO INJECTION 100 MG/20ML
85.0000 mg/m2 | Freq: Once | INTRAVENOUS | Status: AC
  Administered 2014-12-27: 175 mg via INTRAVENOUS
  Filled 2014-12-27: qty 35

## 2014-12-28 ENCOUNTER — Other Ambulatory Visit: Payer: Self-pay | Admitting: Hematology and Oncology

## 2014-12-29 ENCOUNTER — Inpatient Hospital Stay: Payer: 59

## 2014-12-29 VITALS — BP 138/90 | HR 62 | Temp 96.9°F | Resp 18

## 2014-12-29 DIAGNOSIS — Z5111 Encounter for antineoplastic chemotherapy: Secondary | ICD-10-CM | POA: Diagnosis not present

## 2014-12-29 MED ORDER — SODIUM CHLORIDE 0.9 % IJ SOLN
10.0000 mL | INTRAMUSCULAR | Status: DC | PRN
  Administered 2014-12-29: 10 mL
  Filled 2014-12-29: qty 10

## 2014-12-29 MED ORDER — HEPARIN SOD (PORK) LOCK FLUSH 100 UNIT/ML IV SOLN
500.0000 [IU] | Freq: Once | INTRAVENOUS | Status: AC | PRN
  Administered 2014-12-29: 500 [IU]

## 2015-01-02 ENCOUNTER — Ambulatory Visit (INDEPENDENT_AMBULATORY_CARE_PROVIDER_SITE_OTHER): Payer: 59 | Admitting: Urology

## 2015-01-02 ENCOUNTER — Encounter: Payer: Self-pay | Admitting: Urology

## 2015-01-02 VITALS — BP 117/70 | HR 66 | Ht 70.0 in | Wt 197.9 lb

## 2015-01-02 DIAGNOSIS — N359 Urethral stricture, unspecified: Secondary | ICD-10-CM | POA: Diagnosis not present

## 2015-01-02 DIAGNOSIS — N35919 Unspecified urethral stricture, male, unspecified site: Secondary | ICD-10-CM

## 2015-01-02 LAB — BLADDER SCAN AMB NON-IMAGING: SCAN RESULT: 0

## 2015-01-02 NOTE — Progress Notes (Signed)
01/02/2015 12:08 PM   [name] [name] [name] 1955-12-15 [id number]  Referring provider: No referring provider defined for this encounter.  Chief Complaint  Patient presents with  . Uretheral Stricture    6wk follow up    HPI:  Patient is a 59 year old male with history of rectal cancer status post LAR with recurrent bulbar urethra stricture.  He returns today for uroflow/PVR is a 6 week follow-up after his most recent urethral dilation by Dr. Erlene Quan. The patient has been performing CIC every other day without difficulty. He states that there is no resistance and he gets a small amount of urine every time. He feels that he is emptying his bladder when he urinates. He notes his stream is strong. Of note he is currently under chemotherapy for his rectal cancer. He is on cycle 1 of 7.    Pertinent history from patient's last visit with Dr. Erlene Quan:  59 year old male with rectal cancer who was taken to the OR on 10/23/2014 by Dr. Marina Gravel for low anterior resection. I was asked to place preoperative stents for the purpose of ureteral identification. Intraoperatively, he was found to have fairly significant bulbar urethral stricture which required urethral dilation to access the bladder. He is also found to have left ureteral narrowing with J hooking of the distal ureter on that side. The distal ureter was directly visualized using a rigid ureteroscope which showed no intraluminal tumors. Due to somewhat difficulty accessing the kidney, I did elect to go and place a double-J stent on the left. Foley catheter remained in place a week while in the hospital. He ultimately passed a successful voiding trial. He returns to the office today for cystoscopy, stent removal.  Note, the patient does now in retrospect recall a history of urethral stricture disease. He is undergone multiple serial dilations by Dr. Jeanette Caprice in the remote past.   PMH: Past Medical History  Diagnosis Date  . Rectal cancer    . Cancer   . Iron deficiency anemia   . Pulmonary nodules   . Hypertension   . Hyperlipidemia   . Hematuria   . Colon cancer   . Myocardial infarction   . Ureter, stricture   . Gout     Surgical History: Past Surgical History  Procedure Laterality Date  . Colonoscopy 06/06/14    . Colonoscopy with esophagogastroduodenoscopy (egd)    . Portacath placement  07/01/2014    Dr. Marina Gravel  . Coronary angioplasty with stent placement  03/2004  . Bowel resection N/A 10/23/2014    Procedure: LOW ANTERIOR BOWEL RESECTION;  Surgeon: Sherri Rad, MD;  Location: [facility];  Service: General;  Laterality: N/A;  . Laparotomy N/A 10/23/2014    Procedure: EXPLORATORY LAPAROTOMY;  Surgeon: Sherri Rad, MD;  Location: [facility];  Service: General;  Laterality: N/A;  . Diverting ileostomy N/A 10/23/2014    Procedure: DIVERTING ILEOSTOMY;  Surgeon: Sherri Rad, MD;  Location: [facility];  Service: General;  Laterality: N/A;  . Cystoscopy with stent placement Bilateral 10/23/2014    Procedure: CYSTOSCOPY WITH STENT PLACEMENT,URETHRAL DILATION, LEFT RETROGRADE PYELOGRAM, URETEROSCOPY;  Surgeon: Hollice Espy, MD;  Location: [facility];  Service: Urology;  Laterality: Bilateral;    Home Medications:    Medication List       This list is accurate as of: 01/02/15 12:08 PM.  Always use your most recent med list.               aspirin EC 81 MG tablet  Take by  mouth.     colchicine 0.6 MG tablet  Take 0.6 mg by mouth daily.     HYDROcodone-acetaminophen 10-325 MG per tablet  Commonly known as:  NORCO  Take 1 tablet by mouth every 6 (six) hours as needed.     Iron 325 (65 FE) MG Tabs  Take 1 tablet by mouth daily.     vitamin E 400 UNIT capsule  Take 400 Units by mouth 2 (two) times daily.        Allergies:  Allergies  Allergen Reactions  . No Known Allergies     Family History: Family History  Problem Relation Age of Onset  . Hypertension Brother   . Hypertension Sister   . Hypertension Father   .  Heart disease Paternal Grandfather   . Diabetes Father   . Diabetes Brother     Social History:  reports that he quit smoking about 30 years ago. He has never used smokeless tobacco. He reports that he does not drink alcohol or use illicit drugs.  ROS: UROLOGY Frequent Urination?: No Hard to postpone urination?: No Burning/pain with urination?: Yes Get up at night to urinate?: Yes Leakage of urine?: No Urine stream starts and stops?: No Trouble starting stream?: No Do you have to strain to urinate?: No Blood in urine?: No Urinary tract infection?: No Sexually transmitted disease?: No Injury to kidneys or bladder?: No Painful intercourse?: No Weak stream?: No Erection problems?: No Penile pain?: Yes  Gastrointestinal Nausea?: No Vomiting?: No Indigestion/heartburn?: No Diarrhea?: No Constipation?: No  Constitutional Fever: No Night sweats?: No Weight loss?: No Fatigue?: No  Skin Skin rash/lesions?: No Itching?: No  Eyes Blurred vision?: No Double vision?: No  Ears/Nose/Throat Sore throat?: No Sinus problems?: No  Hematologic/Lymphatic Swollen glands?: No Easy bruising?: No  Cardiovascular Leg swelling?: No Chest pain?: No  Respiratory Cough?: No Shortness of breath?: No  Endocrine Excessive thirst?: No  Musculoskeletal Back pain?: No Joint pain?: No  Neurological Headaches?: No Dizziness?: No  Psychologic Depression?: No Anxiety?: No  Physical Exam: BP 117/70 mmHg  Pulse 66  Ht 5\' 10"  (1.778 m)  Wt 197 lb 14.4 oz (89.767 kg)  BMI 28.40 kg/m2  Constitutional:  Alert and oriented, No acute distress. HEENT: [place] AT, moist mucus membranes.  Trachea midline, no masses. Cardiovascular: No clubbing, cyanosis, or edema. Respiratory: Normal respiratory effort, no increased work of breathing. GI: Abdomen is soft, nontender, nondistended GU: No CVA tenderness.  Skin: No rashes, bruises or suspicious lesions. Lymph: No cervical or inguinal  adenopathy. Neurologic: Grossly intact, no focal deficits, moving all 4 extremities. Psychiatric: Normal mood and affect.  Laboratory Data: Lab Results  Component Value Date   WBC 5.0 12/26/2014   HGB 9.6* 12/26/2014   HCT 30.6* 12/26/2014   MCV 72.8* 12/26/2014   PLT 237 12/26/2014    Lab Results  Component Value Date   CREATININE 1.21 12/26/2014    No results found for: PSA  No results found for: TESTOSTERONE  No results found for: HGBA1C  Urinalysis    Component Value Date/Time   COLORURINE AMBER* 10/17/2014 1035   COLORURINE Yellow 02/01/2014 1416   APPEARANCEUR HAZY* 10/17/2014 1035   APPEARANCEUR Clear 02/01/2014 1416   LABSPEC 1.014 10/17/2014 1035   LABSPEC 1.013 02/01/2014 1416   PHURINE 5.0 10/17/2014 1035   PHURINE 5.0 02/01/2014 1416   GLUCOSEU Negative 11/20/2014 0913   GLUCOSEU Negative 02/01/2014 1416   HGBUR 3+* 10/17/2014 1035   HGBUR Negative 02/01/2014 1416   BILIRUBINUR  Negative 11/20/2014 0913   BILIRUBINUR NEGATIVE 10/17/2014 1035   BILIRUBINUR Negative 02/01/2014 [place] 10/17/2014 1035   KETONESUR Negative 02/01/2014 1416   PROTEINUR 30* 10/17/2014 1035   PROTEINUR Negative 02/01/2014 1416   NITRITE Negative 11/20/2014 0913   NITRITE NEGATIVE 10/17/2014 1035   NITRITE Negative 02/01/2014 1416   LEUKOCYTESUR 1+* 11/20/2014 0913   LEUKOCYTESUR 2+* 10/17/2014 1035   LEUKOCYTESUR Trace 02/01/2014 1416   Uroflow results  Peak flow of 13  Average flow 5.9  Void volume 60 cc  PVR 0    Assessment & Plan:    1.  Recurrent Bulbar Urethral Stricture The stricture appears to be patent as the patient is able to easily catheterize himself. His uroflow was less than ideal as he emptied his bladder prior to the appointment and only had 60 cc after waiting an hour from his last void. Patient will continue his CIC. We recommended it be done daily however the patient has been doing it every other day. We will see him back in  2 months for repeat uroflow and PVR. We recommended that he does not void prior to his appointment.  Nickie Retort, MD  [facility] [address] [place], [place] [address] [phone]

## 2015-01-02 NOTE — Progress Notes (Signed)
Uroflow  Peak Flow: 13.15ml Average Flow: 5.37ml Voided Volume: 60.72ml Voiding Time: 10.4sec Flow Time: 10.2sec Time to Peak Flow: 2.5sec  PVR Volume: 243ml

## 2015-01-03 ENCOUNTER — Inpatient Hospital Stay: Payer: 59

## 2015-01-03 ENCOUNTER — Telehealth: Payer: Self-pay | Admitting: Urology

## 2015-01-03 ENCOUNTER — Inpatient Hospital Stay (HOSPITAL_BASED_OUTPATIENT_CLINIC_OR_DEPARTMENT_OTHER): Payer: 59 | Admitting: Hematology and Oncology

## 2015-01-03 ENCOUNTER — Ambulatory Visit: Payer: 59 | Admitting: Internal Medicine

## 2015-01-03 ENCOUNTER — Other Ambulatory Visit: Payer: Self-pay | Admitting: Hematology and Oncology

## 2015-01-03 VITALS — BP 128/80 | HR 57 | Temp 98.8°F | Resp 18 | Ht 70.0 in | Wt 197.0 lb

## 2015-01-03 DIAGNOSIS — C20 Malignant neoplasm of rectum: Secondary | ICD-10-CM

## 2015-01-03 DIAGNOSIS — R5383 Other fatigue: Secondary | ICD-10-CM | POA: Diagnosis not present

## 2015-01-03 DIAGNOSIS — D509 Iron deficiency anemia, unspecified: Secondary | ICD-10-CM | POA: Diagnosis not present

## 2015-01-03 DIAGNOSIS — Z85038 Personal history of other malignant neoplasm of large intestine: Secondary | ICD-10-CM

## 2015-01-03 DIAGNOSIS — I739 Peripheral vascular disease, unspecified: Secondary | ICD-10-CM

## 2015-01-03 DIAGNOSIS — Z5111 Encounter for antineoplastic chemotherapy: Secondary | ICD-10-CM | POA: Diagnosis not present

## 2015-01-03 DIAGNOSIS — Z79899 Other long term (current) drug therapy: Secondary | ICD-10-CM

## 2015-01-03 LAB — CBC WITH DIFFERENTIAL/PLATELET
Basophils Absolute: 0 10*3/uL (ref 0–0.1)
Basophils Relative: 0 %
Eosinophils Absolute: 0.2 10*3/uL (ref 0–0.7)
Eosinophils Relative: 4 %
HCT: 32.8 % — ABNORMAL LOW (ref 40.0–52.0)
Hemoglobin: 10.5 g/dL — ABNORMAL LOW (ref 13.0–18.0)
Lymphocytes Relative: 10 %
Lymphs Abs: 0.5 10*3/uL — ABNORMAL LOW (ref 1.0–3.6)
MCH: 23.1 pg — ABNORMAL LOW (ref 26.0–34.0)
MCHC: 32.1 g/dL (ref 32.0–36.0)
MCV: 71.9 fL — ABNORMAL LOW (ref 80.0–100.0)
Monocytes Absolute: 0.3 10*3/uL (ref 0.2–1.0)
Monocytes Relative: 6 %
Neutro Abs: 4.1 10*3/uL (ref 1.4–6.5)
Neutrophils Relative %: 80 %
Platelets: 273 10*3/uL (ref 150–440)
RBC: 4.57 MIL/uL (ref 4.40–5.90)
RDW: 17.3 % — ABNORMAL HIGH (ref 11.5–14.5)
WBC: 5.1 10*3/uL (ref 3.8–10.6)

## 2015-01-03 LAB — BASIC METABOLIC PANEL
Anion gap: 6 (ref 5–15)
BUN: 25 mg/dL — ABNORMAL HIGH (ref 6–20)
CO2: 26 mmol/L (ref 22–32)
Calcium: 8.9 mg/dL (ref 8.9–10.3)
Chloride: 102 mmol/L (ref 101–111)
Creatinine, Ser: 1.3 mg/dL — ABNORMAL HIGH (ref 0.61–1.24)
GFR calc Af Amer: >60 mL/min (ref >60)
GFR calc non Af Amer: 58 mL/min — ABNORMAL LOW (ref >60)
Glucose, Bld: 90 mg/dL (ref 65–99)
Potassium: 4.2 mmol/L (ref 3.5–5.1)
Sodium: 134 mmol/L — ABNORMAL LOW (ref 135–145)

## 2015-01-03 NOTE — Telephone Encounter (Signed)
Entered in Error

## 2015-01-03 NOTE — Progress Notes (Signed)
[name] day:  01/03/2015  Chief Complaint: [name] MONIE [name] is a 59 y.o. male  with clinical stage IIA rectal cancer s/p neoadjuvant chemotherapy and radiation followed by low anterior resection who is seen for assessment on day 8 of cycle #1 FOLFOX chemotherapy.  HPI:  The patient was last seen in the medical oncology clinic on 12/20/2014.  At that time, he was to receive cycle #1 FOLFOX.  With the start of his infusion, he developed  burning along the central line. Chemotherapy was stopped.  He underwent line dye study to ensure integrity of his central line.  There was no extravasation. There was a fibrin sheath.  Because of the lateness of the day, chemotherapy was not restarted.  He returned on 12/27/2014 for cycle #1 FOLFOX.   Pump was disconnected on 12/29/2014 (Sunday).  He tolerated his chemotherapy well.  He noted a little cold neuropathy (zinging in throat with cold water).  He noted more liquid stool in his ostomy, although no increased volume.  He has had some issues with his ostomy bag and adhesiveness.  He has had some leakage.  He has had no nausea or vomiting.  Past Medical History  Diagnosis Date  . Rectal cancer   . Cancer   . Iron deficiency anemia   . Pulmonary nodules   . Hypertension   . Hyperlipidemia   . Hematuria   . Colon cancer   . Myocardial infarction   . Ureter, stricture   . Gout     Past Surgical History  Procedure Laterality Date  . Colonoscopy 06/06/14    . Colonoscopy with esophagogastroduodenoscopy (egd)    . Portacath placement  07/01/2014    Dr. Marina Gravel  . Coronary angioplasty with stent placement  03/2004  . Bowel resection N/A 10/23/2014    Procedure: LOW ANTERIOR BOWEL RESECTION;  Surgeon: Sherri Rad, MD;  Location: [facility];  Service: General;  Laterality: N/A;  . Laparotomy N/A 10/23/2014    Procedure: EXPLORATORY LAPAROTOMY;  Surgeon: Sherri Rad, MD;  Location: [facility];  Service: General;   Laterality: N/A;  . Diverting ileostomy N/A 10/23/2014    Procedure: DIVERTING ILEOSTOMY;  Surgeon: Sherri Rad, MD;  Location: [facility];  Service: General;  Laterality: N/A;  . Cystoscopy with stent placement Bilateral 10/23/2014    Procedure: CYSTOSCOPY WITH STENT PLACEMENT,URETHRAL DILATION, LEFT RETROGRADE PYELOGRAM, URETEROSCOPY;  Surgeon: Hollice Espy, MD;  Location: [facility];  Service: Urology;  Laterality: Bilateral;    Family History  Problem Relation Age of Onset  . Hypertension Brother   . Hypertension Sister   . Hypertension Father   . Heart disease Paternal Grandfather   . Diabetes Father   . Diabetes Brother     Social History:  reports that he quit smoking about 30 years ago. He has never used smokeless tobacco. He reports that he does not drink alcohol or use illicit drugs.  The patient is accompanied by his wife, Meredith Mody, today.  Allergies:  Allergies  Allergen Reactions  . No Known Allergies     Current Medications: Current Outpatient Prescriptions  Medication Sig Dispense Refill  . aspirin EC 81 MG tablet Take by mouth.    . colchicine 0.6 MG tablet Take 0.6 mg by mouth daily.    . vitamin E 400 UNIT capsule Take 400 Units by mouth 2 (two) times daily.     No current facility-administered medications for this visit.   Facility-Administered Medications Ordered in  Other Visits  Medication Dose Route Frequency Provider Last Rate Last Dose  . sodium chloride 0.9 % injection 10 mL  10 mL Intravenous PRN Dallas Schimke, MD   10 mL at 08/26/14 1300    Review of Systems:  GENERAL:  Feels fine.   No fevers, sweats.  Weight stable. PERFORMANCE STATUS (ECOG):  1 HEENT:  No visual changes, runny nose, sore throat, mouth sores or tenderness. Lungs: No shortness of breath or cough.  No hemoptysis. Cardiac:  No chest pain, palpitations, orthopnea, or PND. GI:  Ostomy with some leakage secondary to liquid stool and adhesive issues.  No nausea, vomiting, diarrhea,  constipation, melena or hematochezia. GU:  No urgency, frequency, dysuria, and intermittent hematuria. Musculoskeletal:  No back pain.  No joint pain.  No muscle tenderness. Extremities:  No pain or swelling. Skin:  No rashes or skin changes. Neuro:  No headache, numbness or weakness, balance or coordination issues. Endocrine:  No diabetes, thyroid issues, hot flashes or night sweats. Psych:  No mood changes, depression or anxiety. Review of systems:  All other systems reviewed and found to be negative.   Physical Exam: Blood pressure 128/80, pulse 57, temperature 98.8 F (37.1 C), temperature source Oral, resp. rate 18, height $RemoveBe'5\' 10"'[id number]$  (1.778 m), weight 197 lb (89.359 kg).  GENERAL:  Well developed, well nourished, sitting comfortably in the exam room in no acute distress.  He has a cane at his side. MENTAL STATUS:  Alert and oriented to person, place and time. HEAD:  Wearing a cap.  Brown hair.  Mustache.  Normocephalic, atraumatic, face symmetric, no Cushingoid features. EYES:  Brown eyes.  Pupils equal round and reactive to light and accomodation.  No conjunctivitis or scleral icterus. ENT:  Oropharynx clear without lesion.  Tongue normal. Mucous membranes moist.  RESPIRATORY:  Clear to auscultation without rales, wheezes or rhonchi. CARDIOVASCULAR:  Regular rate and rhythm without murmur, rub or gallop. ABDOMEN:  Ileostomy with paper tape around edges.  Minimal stool at bottom of bag.  Soft, non-tender with active bowel sounds, and no hepatosplenomegaly.  No masses. SKIN:  No rashes, ulcers or lesions. EXTREMITIES: No edema, no skin discoloration or tenderness.  No palpable cords. LYMPH NODES: No palpable cervical, supraclavicular, axillary or inguinal adenopathy  NEUROLOGICAL: Unremarkable. PSYCH:  Appropriate.   Appointment on 01/03/2015  Component Date Value Ref Range Status  . WBC 01/03/2015 5.1  3.8 - 10.6 K/uL Final  . RBC 01/03/2015 4.57  4.40 - 5.90 MIL/uL Final  .  Hemoglobin 01/03/2015 10.5* 13.0 - 18.0 g/dL Final  . HCT 01/03/2015 32.8* 40.0 - 52.0 % Final  . MCV 01/03/2015 71.9* 80.0 - 100.0 fL Final  . MCH 01/03/2015 23.1* 26.0 - 34.0 pg Final  . MCHC 01/03/2015 32.1  32.0 - 36.0 g/dL Final  . RDW 01/03/2015 17.3* 11.5 - 14.5 % Final  . Platelets 01/03/2015 273  150 - 440 K/uL Final  . Neutrophils Relative % 01/03/2015 80   Final  . Neutro Abs 01/03/2015 4.1  1.4 - 6.5 K/uL Final  . Lymphocytes Relative 01/03/2015 10   Final  . Lymphs Abs 01/03/2015 0.5* 1.0 - 3.6 K/uL Final  . Monocytes Relative 01/03/2015 6   Final  . Monocytes Absolute 01/03/2015 0.3  0.2 - 1.0 K/uL Final  . Eosinophils Relative 01/03/2015 4   Final  . Eosinophils Absolute 01/03/2015 0.2  0 - 0.7 K/uL Final  . Basophils Relative 01/03/2015 0   Final  . Basophils Absolute 01/03/2015  0.0  0 - 0.1 K/uL Final  . Sodium 01/03/2015 134* 135 - 145 mmol/L Final  . Potassium 01/03/2015 4.2  3.5 - 5.1 mmol/L Final  . Chloride 01/03/2015 102  101 - 111 mmol/L Final  . CO2 01/03/2015 26  22 - 32 mmol/L Final  . Glucose, Bld 01/03/2015 90  65 - 99 mg/dL Final  . BUN 01/03/2015 25* 6 - 20 mg/dL Final  . Creatinine, Ser 01/03/2015 1.30* 0.61 - 1.24 mg/dL Final  . Calcium 01/03/2015 8.9  8.9 - 10.3 mg/dL Final  . GFR calc non Af Amer 01/03/2015 58* >60 mL/min Final  . GFR calc Af Amer 01/03/2015 >60  >60 mL/min Final   Comment: (NOTE) The eGFR has been calculated using the CKD EPI equation. This calculation has not been validated in all clinical situations. eGFR's persistently <60 mL/min signify possible Chronic Kidney Disease.   . Anion gap 01/03/2015 6  5 - 15 Final  Office Visit on 01/02/2015  Component Date Value Ref Range Status  . Scan Result 01/02/2015 0   Final    Assessment:  FRANZ SVEC [name] is a 59 y.o. male with clinical stage IIA (T3N0) rectal cancer.  Colonoscopy on 06/06/2014 revealed a large non-circumferential distal rectal mass. Biopsy was positive for  adenocarcinoma.   CT scans revealed a 3 mm pulmonary nodule (indeterminate). There was a large rectal mass suspicious for transmural extension into the mesorectum.There was equivocal perirectal and sigmoid mesocolon nodes. There was no liver metastasis or extrapelvic disease.  Endoscopic ultrasound at [facility] revealed a 70% circumferential mass at 9-14 cm from the anal verge and measuring 9 mm in maximal thickness. Ultrasound suggested breakthrough of the muscularis propria with invasion into the perirectal fat.  He received neoadjuvant chemotherapy (continuous infusion 5FU) and radiation. He received radiation from 07/03/2014 until 08/16/2014. He underwent low anterior resection with diverting loop ileostomy on 10/23/2014.  Pathology revealed a complete response.  Zero of 10 lymph nodes were positive.  He has iron deficiency anemia.  Ferritin was 34 on 10/04/2014.   He has significant peripheral vascular disease. A stent or bypass is plannned.   He is currently day 8 of cycle #1 FOLFOX chemotherapy (began 12/27/2014).  He tolerated his chemotherapy well.  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  Stool is a little loose (no increased volume).  Plan: 1.  Labs today:  CBC with diff, BMP. 2.  Discuss use of imodium. 3.  Follow-up with ostomy nurse. 4.  Discuss iron deficiency.  Encourage iron rich foods.  Begin ferrous sulfate 325 mg a day with OJ or vitamin C. 5.  RTC on 09/26 for MD assessment, labs (CBC with diff, CMP, Mg), and cycle #2 FOLFOX.   Lequita Asal, MD  01/03/2015, 2:55 PM

## 2015-01-04 ENCOUNTER — Encounter: Payer: Self-pay | Admitting: Hematology and Oncology

## 2015-01-04 DIAGNOSIS — D509 Iron deficiency anemia, unspecified: Secondary | ICD-10-CM | POA: Insufficient documentation

## 2015-01-06 ENCOUNTER — Ambulatory Visit: Payer: 59 | Admitting: Hematology and Oncology

## 2015-01-06 ENCOUNTER — Ambulatory Visit: Payer: 59

## 2015-01-06 ENCOUNTER — Other Ambulatory Visit: Payer: 59

## 2015-01-09 ENCOUNTER — Ambulatory Visit: Payer: Managed Care, Other (non HMO) | Admitting: Obstetrics and Gynecology

## 2015-01-13 ENCOUNTER — Inpatient Hospital Stay: Payer: 59

## 2015-01-13 ENCOUNTER — Inpatient Hospital Stay (HOSPITAL_BASED_OUTPATIENT_CLINIC_OR_DEPARTMENT_OTHER): Payer: 59 | Admitting: Oncology

## 2015-01-13 ENCOUNTER — Ambulatory Visit: Payer: 59 | Admitting: Hematology and Oncology

## 2015-01-13 VITALS — BP 130/84 | HR 69 | Temp 97.9°F | Resp 16 | Wt 197.1 lb

## 2015-01-13 DIAGNOSIS — I739 Peripheral vascular disease, unspecified: Secondary | ICD-10-CM

## 2015-01-13 DIAGNOSIS — C20 Malignant neoplasm of rectum: Secondary | ICD-10-CM

## 2015-01-13 DIAGNOSIS — Z85038 Personal history of other malignant neoplasm of large intestine: Secondary | ICD-10-CM

## 2015-01-13 DIAGNOSIS — Z5111 Encounter for antineoplastic chemotherapy: Secondary | ICD-10-CM | POA: Diagnosis not present

## 2015-01-13 DIAGNOSIS — D509 Iron deficiency anemia, unspecified: Secondary | ICD-10-CM

## 2015-01-13 DIAGNOSIS — Z923 Personal history of irradiation: Secondary | ICD-10-CM

## 2015-01-13 DIAGNOSIS — R5383 Other fatigue: Secondary | ICD-10-CM | POA: Diagnosis not present

## 2015-01-13 DIAGNOSIS — Z79899 Other long term (current) drug therapy: Secondary | ICD-10-CM

## 2015-01-13 LAB — COMPREHENSIVE METABOLIC PANEL
ALT: 27 U/L (ref 17–63)
AST: 23 U/L (ref 15–41)
Albumin: 3.9 g/dL (ref 3.5–5.0)
Alkaline Phosphatase: 77 U/L (ref 38–126)
Anion gap: 5 (ref 5–15)
BUN: 30 mg/dL — ABNORMAL HIGH (ref 6–20)
CO2: 22 mmol/L (ref 22–32)
Calcium: 8.6 mg/dL — ABNORMAL LOW (ref 8.9–10.3)
Chloride: 107 mmol/L (ref 101–111)
Creatinine, Ser: 1.37 mg/dL — ABNORMAL HIGH (ref 0.61–1.24)
GFR calc Af Amer: >60 mL/min (ref >60)
GFR calc non Af Amer: 54 mL/min — ABNORMAL LOW (ref >60)
Glucose, Bld: 111 mg/dL — ABNORMAL HIGH (ref 65–99)
Potassium: 3.5 mmol/L (ref 3.5–5.1)
Sodium: 134 mmol/L — ABNORMAL LOW (ref 135–145)
Total Bilirubin: 0.3 mg/dL (ref 0.3–1.2)
Total Protein: 7.1 g/dL (ref 6.5–8.1)

## 2015-01-13 LAB — CBC WITH DIFFERENTIAL/PLATELET
Basophils Absolute: 0 10*3/uL (ref 0–0.1)
Basophils Relative: 1 %
Eosinophils Absolute: 0.2 10*3/uL (ref 0–0.7)
Eosinophils Relative: 5 %
HCT: 31.9 % — ABNORMAL LOW (ref 40.0–52.0)
Hemoglobin: 10.2 g/dL — ABNORMAL LOW (ref 13.0–18.0)
Lymphocytes Relative: 12 %
Lymphs Abs: 0.4 10*3/uL — ABNORMAL LOW (ref 1.0–3.6)
MCH: 22.6 pg — ABNORMAL LOW (ref 26.0–34.0)
MCHC: 31.9 g/dL — ABNORMAL LOW (ref 32.0–36.0)
MCV: 70.7 fL — ABNORMAL LOW (ref 80.0–100.0)
Monocytes Absolute: 0.4 10*3/uL (ref 0.2–1.0)
Monocytes Relative: 12 %
Neutro Abs: 2.3 10*3/uL (ref 1.4–6.5)
Neutrophils Relative %: 70 %
Platelets: 282 10*3/uL (ref 150–440)
RBC: 4.52 MIL/uL (ref 4.40–5.90)
RDW: 17.4 % — ABNORMAL HIGH (ref 11.5–14.5)
WBC: 3.2 10*3/uL — ABNORMAL LOW (ref 3.8–10.6)

## 2015-01-13 LAB — MAGNESIUM: Magnesium: 1.8 mg/dL (ref 1.7–2.4)

## 2015-01-13 MED ORDER — DEXTROSE 5 % IV SOLN
Freq: Once | INTRAVENOUS | Status: AC
  Administered 2015-01-13: 11:00:00 via INTRAVENOUS
  Filled 2015-01-13: qty 1000

## 2015-01-13 MED ORDER — FLUOROURACIL CHEMO INJECTION 2.5 GM/50ML
400.0000 mg/m2 | Freq: Once | INTRAVENOUS | Status: AC
  Administered 2015-01-13: 800 mg via INTRAVENOUS
  Filled 2015-01-13: qty 16

## 2015-01-13 MED ORDER — LEUCOVORIN CALCIUM INJECTION 350 MG
850.0000 mg | Freq: Once | INTRAMUSCULAR | Status: AC
  Administered 2015-01-13: 850 mg via INTRAVENOUS
  Filled 2015-01-13: qty 25

## 2015-01-13 MED ORDER — SODIUM CHLORIDE 0.9 % IJ SOLN
10.0000 mL | INTRAMUSCULAR | Status: DC | PRN
  Administered 2015-01-13: 10 mL
  Filled 2015-01-13: qty 10

## 2015-01-13 MED ORDER — SODIUM CHLORIDE 0.9 % IV SOLN
Freq: Once | INTRAVENOUS | Status: AC
  Administered 2015-01-13: 11:00:00 via INTRAVENOUS
  Filled 2015-01-13: qty 4

## 2015-01-13 MED ORDER — OXALIPLATIN CHEMO INJECTION 100 MG/20ML
85.0000 mg/m2 | Freq: Once | INTRAVENOUS | Status: AC
  Administered 2015-01-13: 175 mg via INTRAVENOUS
  Filled 2015-01-13: qty 35

## 2015-01-13 MED ORDER — SODIUM CHLORIDE 0.9 % IV SOLN
2400.0000 mg/m2 | INTRAVENOUS | Status: DC
  Administered 2015-01-13: 4950 mg via INTRAVENOUS
  Filled 2015-01-13: qty 99

## 2015-01-15 ENCOUNTER — Inpatient Hospital Stay: Payer: 59

## 2015-01-15 VITALS — BP 133/78 | HR 62 | Temp 96.6°F | Resp 18

## 2015-01-15 DIAGNOSIS — Z5111 Encounter for antineoplastic chemotherapy: Secondary | ICD-10-CM | POA: Diagnosis not present

## 2015-01-15 DIAGNOSIS — C20 Malignant neoplasm of rectum: Secondary | ICD-10-CM

## 2015-01-15 MED ORDER — HEPARIN SOD (PORK) LOCK FLUSH 100 UNIT/ML IV SOLN
500.0000 [IU] | Freq: Once | INTRAVENOUS | Status: AC | PRN
  Administered 2015-01-15: 500 [IU]

## 2015-01-15 MED ORDER — SODIUM CHLORIDE 0.9 % IJ SOLN
10.0000 mL | INTRAMUSCULAR | Status: DC | PRN
  Administered 2015-01-15: 10 mL
  Filled 2015-01-15: qty 10

## 2015-01-15 MED ORDER — HEPARIN SOD (PORK) LOCK FLUSH 100 UNIT/ML IV SOLN
INTRAVENOUS | Status: AC
  Filled 2015-01-15: qty 5

## 2015-01-24 ENCOUNTER — Other Ambulatory Visit: Payer: Self-pay | Admitting: *Deleted

## 2015-01-24 DIAGNOSIS — C20 Malignant neoplasm of rectum: Secondary | ICD-10-CM

## 2015-01-24 NOTE — Progress Notes (Signed)
[place] [facility] day:  01/03/2015  Chief Complaint: [name] SLATTER [name] is a 59 y.o. male  with clinical stage IIA rectal cancer s/p neoadjuvant chemotherapy and radiation followed by low anterior resection.   HPI:  Patient returns to clinic today for further evaluation and consideration of his next infusion of FOLFOX. He is tolerating his treatments well without significant side effects. 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 He denies any fevers. He does not complain of any problems with his ostomy bag. He denies any nausea or vomiting. Patient offers no further specific complaints.   Past Medical History  Diagnosis Date  . Rectal cancer   . Cancer   . Iron deficiency anemia   . Pulmonary nodules   . Hypertension   . Hyperlipidemia   . Hematuria   . Colon cancer   . Myocardial infarction   . Ureter, stricture   . Gout     Past Surgical History  Procedure Laterality Date  . Colonoscopy 06/06/14    . Colonoscopy with esophagogastroduodenoscopy (egd)    . Portacath placement  07/01/2014    Dr. Marina Gravel  . Coronary angioplasty with stent placement  03/2004  . Bowel resection N/A 10/23/2014    Procedure: LOW ANTERIOR BOWEL RESECTION;  Surgeon: Sherri Rad, MD;  Location: [facility];  Service: General;  Laterality: N/A;  . Laparotomy N/A 10/23/2014    Procedure: EXPLORATORY LAPAROTOMY;  Surgeon: Sherri Rad, MD;  Location: [facility];  Service: General;  Laterality: N/A;  . Diverting ileostomy N/A 10/23/2014    Procedure: DIVERTING ILEOSTOMY;  Surgeon: Sherri Rad, MD;  Location: [facility];  Service: General;  Laterality: N/A;  . Cystoscopy with stent placement Bilateral 10/23/2014    Procedure: CYSTOSCOPY WITH STENT PLACEMENT,URETHRAL DILATION, LEFT RETROGRADE PYELOGRAM, URETEROSCOPY;  Surgeon: Hollice Espy, MD;  Location: [facility];  Service: Urology;  Laterality: Bilateral;    Family History  Problem Relation Age of Onset  . Hypertension Brother   .  Hypertension Sister   . Hypertension Father   . Heart disease Paternal Grandfather   . Diabetes Father   . Diabetes Brother     Social History:  reports that he quit smoking about 30 years ago. He has never used smokeless tobacco. He reports that he does not drink alcohol or use illicit drugs.  The patient is accompanied by his wife, Meredith Mody, today.  Allergies:  Allergies  Allergen Reactions  . No Known Allergies     Current Medications: Current Outpatient Prescriptions  Medication Sig Dispense Refill  . aspirin EC 81 MG tablet Take by mouth.    . cloNIDine (CATAPRES) 0.2 MG tablet Take 0.2 mg by mouth 2 (two) times daily.    . colchicine 0.6 MG tablet Take 0.6 mg by mouth daily.    . vitamin E 400 UNIT capsule Take 400 Units by mouth 2 (two) times daily.     No current facility-administered medications for this visit.   Facility-Administered Medications Ordered in Other Visits  Medication Dose Route Frequency Provider Last Rate Last Dose  . sodium chloride 0.9 % injection 10 mL  10 mL Intravenous PRN Dallas Schimke, MD   10 mL at 08/26/14 1300    Review of Systems:  GENERAL:  Feels fine.   No fevers, sweats.  Weight stable. PERFORMANCE STATUS (ECOG):  1 HEENT:  No visual changes, runny nose, sore throat, mouth sores or tenderness. Lungs: No shortness of breath or cough.  No hemoptysis.  Cardiac:  No chest pain, palpitations, orthopnea, or PND. GI:  Ostomy with some leakage secondary to liquid stool and adhesive issues.  No nausea, vomiting, diarrhea, constipation, melena or hematochezia. GU:  No urgency, frequency, dysuria, and intermittent hematuria. Musculoskeletal:  No back pain.  No joint pain.  No muscle tenderness. Extremities:  No pain or swelling. Skin:  No rashes or skin changes. Neuro:  No headache, numbness or weakness, balance or coordination issues. Endocrine:  No diabetes, thyroid issues, hot flashes or night sweats. Psych:  No mood changes, depression or  anxiety. Review of systems:  All other systems reviewed and found to be negative.   Physical Exam: Blood pressure 130/84, pulse 69, temperature 97.9 F (36.6 C), temperature source Tympanic, resp. rate 16, weight 197 lb 1.5 oz (89.401 kg).  GENERAL:  Well developed, well nourished, sitting comfortably in the exam room in no acute distress.  He has a cane at his side. MENTAL STATUS:  Alert and oriented to person, place and time. HEAD:  Wearing a cap.  Brown hair.  Mustache.  Normocephalic, atraumatic, face symmetric, no Cushingoid features. EYES:  Brown eyes.  Pupils equal round and reactive to light and accomodation.  No conjunctivitis or scleral icterus. ENT:  Oropharynx clear without lesion.  Tongue normal. Mucous membranes moist.  RESPIRATORY:  Clear to auscultation without rales, wheezes or rhonchi. CARDIOVASCULAR:  Regular rate and rhythm without murmur, rub or gallop. ABDOMEN:  Ileostomy with paper tape around edges.  Minimal stool at bottom of bag.  Soft, non-tender with active bowel sounds, and no hepatosplenomegaly.  No masses. SKIN:  No rashes, ulcers or lesions. EXTREMITIES: No edema, no skin discoloration or tenderness.  No palpable cords. LYMPH NODES: No palpable cervical, supraclavicular, axillary or inguinal adenopathy  NEUROLOGICAL: Unremarkable. PSYCH:  Appropriate.   Infusion on 01/13/2015  Component Date Value Ref Range Status  . WBC 01/13/2015 3.2* 3.8 - 10.6 K/uL Final   A-LINE DRAW  . RBC 01/13/2015 4.52  4.40 - 5.90 MIL/uL Final  . Hemoglobin 01/13/2015 10.2* 13.0 - 18.0 g/dL Final  . HCT 01/13/2015 31.9* 40.0 - 52.0 % Final  . MCV 01/13/2015 70.7* 80.0 - 100.0 fL Final  . MCH 01/13/2015 22.6* 26.0 - 34.0 pg Final  . MCHC 01/13/2015 31.9* 32.0 - 36.0 g/dL Final  . RDW 01/13/2015 17.4* 11.5 - 14.5 % Final  . Platelets 01/13/2015 282  150 - 440 K/uL Final  . Neutrophils Relative % 01/13/2015 70   Final  . Neutro Abs 01/13/2015 2.3  1.4 - 6.5 K/uL Final  .  Lymphocytes Relative 01/13/2015 12   Final  . Lymphs Abs 01/13/2015 0.4* 1.0 - 3.6 K/uL Final  . Monocytes Relative 01/13/2015 12   Final  . Monocytes Absolute 01/13/2015 0.4  0.2 - 1.0 K/uL Final  . Eosinophils Relative 01/13/2015 5   Final  . Eosinophils Absolute 01/13/2015 0.2  0 - 0.7 K/uL Final  . Basophils Relative 01/13/2015 1   Final  . Basophils Absolute 01/13/2015 0.0  0 - 0.1 K/uL Final  . Sodium 01/13/2015 134* 135 - 145 mmol/L Final  . Potassium 01/13/2015 3.5  3.5 - 5.1 mmol/L Final  . Chloride 01/13/2015 107  101 - 111 mmol/L Final  . CO2 01/13/2015 22  22 - 32 mmol/L Final  . Glucose, Bld 01/13/2015 111* 65 - 99 mg/dL Final  . BUN 01/13/2015 30* 6 - 20 mg/dL Final  . Creatinine, Ser 01/13/2015 1.37* 0.61 - 1.24 mg/dL Final  . Calcium  01/13/2015 8.6* 8.9 - 10.3 mg/dL Final  . Total Protein 01/13/2015 7.1  6.5 - 8.1 g/dL Final  . Albumin 01/13/2015 3.9  3.5 - 5.0 g/dL Final  . AST 01/13/2015 23  15 - 41 U/L Final  . ALT 01/13/2015 27  17 - 63 U/L Final  . Alkaline Phosphatase 01/13/2015 77  38 - 126 U/L Final  . Total Bilirubin 01/13/2015 0.3  0.3 - 1.2 mg/dL Final  . GFR calc non Af Amer 01/13/2015 54* >60 mL/min Final  . GFR calc Af Amer 01/13/2015 >60  >60 mL/min Final   Comment: (NOTE) The eGFR has been calculated using the CKD EPI equation. This calculation has not been validated in all clinical situations. eGFR's persistently <60 mL/min signify possible Chronic Kidney Disease.   . Anion gap 01/13/2015 5  5 - 15 Final  . Magnesium 01/13/2015 1.8  1.7 - 2.4 mg/dL Final    Assessment:  [name] FINLAY [name] is a 59 y.o. male with clinical stage IIA (T3N0) rectal cancer.  Colonoscopy on 06/06/2014 revealed a large non-circumferential distal rectal mass. Biopsy was positive for adenocarcinoma.   CT scans revealed a 3 mm pulmonary nodule (indeterminate). There was a large rectal mass suspicious for transmural extension into the mesorectum.There was equivocal  perirectal and sigmoid mesocolon nodes. There was no liver metastasis or extrapelvic disease.  [facility]oscopic ultrasound at [facility] revealed a 70% circumferential mass at 9-14 cm from the anal verge and measuring 9 mm in maximal thickness. Ultrasound suggested breakthrough of the muscularis propria with invasion into the perirectal fat.  He received neoadjuvant chemotherapy (continuous infusion 5FU) and radiation. He received radiation from 07/03/2014 until 08/16/2014. He underwent low anterior resection with diverting loop ileostomy on 10/23/2014.  Pathology revealed a complete response.  Zero of 10 lymph nodes were positive.  He has iron deficiency anemia.  Ferritin was 34 on 10/04/2014.   He has significant peripheral vascular disease. A stent or bypass is plannned.   Proceed with cycle 2 of 12 of adjuvant FOLFOX chemotherapy. Return to clinic in 2 days for pump removal and then in 2 weeks for consideration of cycle 3. Patient understands he can return to clinic at any time if he has any questions, concerns, or complaints.   Lloyd Huger, MD  01/03/2015, 2:55 PM

## 2015-01-27 ENCOUNTER — Inpatient Hospital Stay: Payer: 59 | Attending: Oncology

## 2015-01-27 ENCOUNTER — Inpatient Hospital Stay (HOSPITAL_BASED_OUTPATIENT_CLINIC_OR_DEPARTMENT_OTHER): Payer: 59 | Admitting: Oncology

## 2015-01-27 ENCOUNTER — Inpatient Hospital Stay: Payer: 59

## 2015-01-27 VITALS — BP 162/81 | HR 71 | Temp 97.6°F | Resp 16 | Wt 202.2 lb

## 2015-01-27 DIAGNOSIS — I739 Peripheral vascular disease, unspecified: Secondary | ICD-10-CM | POA: Insufficient documentation

## 2015-01-27 DIAGNOSIS — D509 Iron deficiency anemia, unspecified: Secondary | ICD-10-CM

## 2015-01-27 DIAGNOSIS — Z5111 Encounter for antineoplastic chemotherapy: Secondary | ICD-10-CM | POA: Diagnosis present

## 2015-01-27 DIAGNOSIS — Z7982 Long term (current) use of aspirin: Secondary | ICD-10-CM | POA: Insufficient documentation

## 2015-01-27 DIAGNOSIS — Z87891 Personal history of nicotine dependence: Secondary | ICD-10-CM | POA: Diagnosis not present

## 2015-01-27 DIAGNOSIS — I252 Old myocardial infarction: Secondary | ICD-10-CM | POA: Diagnosis not present

## 2015-01-27 DIAGNOSIS — E785 Hyperlipidemia, unspecified: Secondary | ICD-10-CM | POA: Insufficient documentation

## 2015-01-27 DIAGNOSIS — C20 Malignant neoplasm of rectum: Secondary | ICD-10-CM

## 2015-01-27 DIAGNOSIS — G629 Polyneuropathy, unspecified: Secondary | ICD-10-CM

## 2015-01-27 DIAGNOSIS — Z79899 Other long term (current) drug therapy: Secondary | ICD-10-CM

## 2015-01-27 DIAGNOSIS — I10 Essential (primary) hypertension: Secondary | ICD-10-CM | POA: Insufficient documentation

## 2015-01-27 DIAGNOSIS — M109 Gout, unspecified: Secondary | ICD-10-CM | POA: Insufficient documentation

## 2015-01-27 LAB — CBC WITH DIFFERENTIAL/PLATELET
BASOS PCT: 1 %
Basophils Absolute: 0 10*3/uL (ref 0–0.1)
EOS ABS: 0.2 10*3/uL (ref 0–0.7)
Eosinophils Relative: 3 %
HCT: 31.7 % — ABNORMAL LOW (ref 40.0–52.0)
HEMOGLOBIN: 10.1 g/dL — AB (ref 13.0–18.0)
Lymphocytes Relative: 7 %
Lymphs Abs: 0.3 10*3/uL — ABNORMAL LOW (ref 1.0–3.6)
MCH: 22.5 pg — ABNORMAL LOW (ref 26.0–34.0)
MCHC: 31.8 g/dL — AB (ref 32.0–36.0)
MCV: 70.6 fL — ABNORMAL LOW (ref 80.0–100.0)
Monocytes Absolute: 0.4 10*3/uL (ref 0.2–1.0)
Monocytes Relative: 9 %
NEUTROS PCT: 80 %
Neutro Abs: 3.9 10*3/uL (ref 1.4–6.5)
Platelets: 231 10*3/uL (ref 150–440)
RBC: 4.48 MIL/uL (ref 4.40–5.90)
RDW: 17.8 % — ABNORMAL HIGH (ref 11.5–14.5)
WBC: 4.9 10*3/uL (ref 3.8–10.6)

## 2015-01-27 LAB — COMPREHENSIVE METABOLIC PANEL
ALBUMIN: 4 g/dL (ref 3.5–5.0)
ALK PHOS: 76 U/L (ref 38–126)
ALT: 15 U/L — AB (ref 17–63)
ANION GAP: 7 (ref 5–15)
AST: 20 U/L (ref 15–41)
BUN: 25 mg/dL — ABNORMAL HIGH (ref 6–20)
CALCIUM: 8.8 mg/dL — AB (ref 8.9–10.3)
CO2: 23 mmol/L (ref 22–32)
Chloride: 109 mmol/L (ref 101–111)
Creatinine, Ser: 1.24 mg/dL (ref 0.61–1.24)
GFR calc Af Amer: >60 mL/min (ref >60)
GFR calc non Af Amer: >60 mL/min (ref >60)
GLUCOSE: 122 mg/dL — AB (ref 65–99)
Potassium: 3.7 mmol/L (ref 3.5–5.1)
SODIUM: 139 mmol/L (ref 135–145)
Total Bilirubin: 0.4 mg/dL (ref 0.3–1.2)
Total Protein: 6.8 g/dL (ref 6.5–8.1)

## 2015-01-27 LAB — MAGNESIUM: Magnesium: 1.6 mg/dL — ABNORMAL LOW (ref 1.7–2.4)

## 2015-01-27 MED ORDER — DEXTROSE 5 % IV SOLN
Freq: Once | INTRAVENOUS | Status: AC
  Administered 2015-01-27: 11:00:00 via INTRAVENOUS
  Filled 2015-01-27: qty 1000

## 2015-01-27 MED ORDER — SODIUM CHLORIDE 0.9 % IV SOLN
Freq: Once | INTRAVENOUS | Status: AC
  Administered 2015-01-27: 11:00:00 via INTRAVENOUS
  Filled 2015-01-27: qty 4

## 2015-01-27 MED ORDER — SODIUM CHLORIDE 0.9 % IV SOLN
2400.0000 mg/m2 | INTRAVENOUS | Status: DC
  Administered 2015-01-27: 4950 mg via INTRAVENOUS
  Filled 2015-01-27: qty 99

## 2015-01-27 MED ORDER — OXALIPLATIN CHEMO INJECTION 100 MG/20ML
85.0000 mg/m2 | Freq: Once | INTRAVENOUS | Status: AC
  Administered 2015-01-27: 175 mg via INTRAVENOUS
  Filled 2015-01-27: qty 35

## 2015-01-27 MED ORDER — MAGNESIUM SULFATE 2 GM/50ML IV SOLN
2.0000 g | Freq: Once | INTRAVENOUS | Status: AC
  Administered 2015-01-27: 2 g via INTRAVENOUS
  Filled 2015-01-27: qty 50

## 2015-01-27 MED ORDER — SODIUM CHLORIDE 0.9 % IV SOLN: 2.0000 g | Freq: Once | INTRAVENOUS | Status: DC

## 2015-01-27 MED ORDER — FLUOROURACIL CHEMO INJECTION 2.5 GM/50ML
400.0000 mg/m2 | Freq: Once | INTRAVENOUS | Status: AC
  Administered 2015-01-27: 800 mg via INTRAVENOUS
  Filled 2015-01-27: qty 16

## 2015-01-27 MED ORDER — HEPARIN SOD (PORK) LOCK FLUSH 100 UNIT/ML IV SOLN: 500.0000 [IU] | Freq: Once | INTRAVENOUS | Status: DC

## 2015-01-27 MED ORDER — SODIUM CHLORIDE 0.9 % IJ SOLN
10.0000 mL | INTRAMUSCULAR | Status: DC | PRN
  Filled 2015-01-27: qty 10

## 2015-01-27 MED ORDER — LEUCOVORIN CALCIUM INJECTION 350 MG
850.0000 mg | Freq: Once | INTRAMUSCULAR | Status: AC
  Administered 2015-01-27: 850 mg via INTRAVENOUS
  Filled 2015-01-27: qty 25

## 2015-01-29 ENCOUNTER — Inpatient Hospital Stay: Payer: 59

## 2015-01-29 VITALS — BP 137/81 | HR 54 | Temp 97.5°F | Resp 18

## 2015-01-29 DIAGNOSIS — Z5111 Encounter for antineoplastic chemotherapy: Secondary | ICD-10-CM | POA: Diagnosis not present

## 2015-01-29 DIAGNOSIS — C801 Malignant (primary) neoplasm, unspecified: Secondary | ICD-10-CM

## 2015-01-29 MED ORDER — HEPARIN SOD (PORK) LOCK FLUSH 100 UNIT/ML IV SOLN
500.0000 [IU] | Freq: Once | INTRAVENOUS | Status: AC
  Administered 2015-01-29: 500 [IU] via INTRAVENOUS
  Filled 2015-01-29: qty 5

## 2015-01-29 MED ORDER — SODIUM CHLORIDE 0.9 % IJ SOLN
10.0000 mL | INTRAMUSCULAR | Status: DC | PRN
  Administered 2015-01-29: 10 mL via INTRAVENOUS
  Filled 2015-01-29: qty 10

## 2015-02-09 NOTE — Progress Notes (Signed)
[place] [facility] day:  01/03/2015  Chief Complaint: [name] CAMPTON [name] is a 59 y.o. male  with clinical stage IIA rectal cancer s/p neoadjuvant chemotherapy and radiation followed by low anterior resection.   HPI:  Patient returns to clinic today for further evaluation and consideration of cycle 3 of 12 of adjuvant FOLFOX. He continues to tolerate his treatments well without significant side effects. He has a mild cold neuropathy that is unchanged and does not affect his day-to-day activity. He denies any fevers. He does not complain of any problems with his ostomy bag. He denies any nausea or vomiting. Patient offers no further specific complaints.   Past Medical History  Diagnosis Date  . Rectal cancer   . Cancer   . Iron deficiency anemia   . Pulmonary nodules   . Hypertension   . Hyperlipidemia   . Hematuria   . Colon cancer   . Myocardial infarction   . Ureter, stricture   . Gout     Past Surgical History  Procedure Laterality Date  . Colonoscopy 06/06/14    . Colonoscopy with esophagogastroduodenoscopy (egd)    . Portacath placement  07/01/2014    Dr. Marina Gravel  . Coronary angioplasty with stent placement  03/2004  . Bowel resection N/A 10/23/2014    Procedure: LOW ANTERIOR BOWEL RESECTION;  Surgeon: Sherri Rad, MD;  Location: [facility];  Service: General;  Laterality: N/A;  . Laparotomy N/A 10/23/2014    Procedure: EXPLORATORY LAPAROTOMY;  Surgeon: Sherri Rad, MD;  Location: [facility];  Service: General;  Laterality: N/A;  . Diverting ileostomy N/A 10/23/2014    Procedure: DIVERTING ILEOSTOMY;  Surgeon: Sherri Rad, MD;  Location: [facility];  Service: General;  Laterality: N/A;  . Cystoscopy with stent placement Bilateral 10/23/2014    Procedure: CYSTOSCOPY WITH STENT PLACEMENT,URETHRAL DILATION, LEFT RETROGRADE PYELOGRAM, URETEROSCOPY;  Surgeon: Hollice Espy, MD;  Location: [facility];  Service: Urology;  Laterality: Bilateral;    Family History   Problem Relation Age of Onset  . Hypertension Brother   . Hypertension Sister   . Hypertension Father   . Heart disease Paternal Grandfather   . Diabetes Father   . Diabetes Brother     Social History:  reports that he quit smoking about 30 years ago. He has never used smokeless tobacco. He reports that he does not drink alcohol or use illicit drugs.  The patient is accompanied by his wife, Meredith Mody, today.  Allergies:  Allergies  Allergen Reactions  . No Known Allergies     Current Medications: Current Outpatient Prescriptions  Medication Sig Dispense Refill  . aspirin EC 81 MG tablet Take by mouth.    . cloNIDine (CATAPRES) 0.2 MG tablet Take 0.2 mg by mouth 2 (two) times daily.    . colchicine 0.6 MG tablet Take 0.6 mg by mouth daily.    . vitamin E 400 UNIT capsule Take 400 Units by mouth 2 (two) times daily.     No current facility-administered medications for this visit.   Facility-Administered Medications Ordered in Other Visits  Medication Dose Route Frequency Provider Last Rate Last Dose  . sodium chloride 0.9 % injection 10 mL  10 mL Intravenous PRN Dallas Schimke, MD   10 mL at 08/26/14 1300    Review of Systems:  GENERAL:  Feels fine.   No fevers, sweats.  Weight stable. PERFORMANCE STATUS (ECOG):  1 HEENT:  No visual changes, runny nose, sore throat, mouth sores  or tenderness. Lungs: No shortness of breath or cough.  No hemoptysis. Cardiac:  No chest pain, palpitations, orthopnea, or PND. GI:  Ostomy with some leakage secondary to liquid stool and adhesive issues.  No nausea, vomiting, diarrhea, constipation, melena or hematochezia. GU:  No urgency, frequency, dysuria, and intermittent hematuria. Musculoskeletal:  No back pain.  No joint pain.  No muscle tenderness. Extremities:  No pain or swelling. Skin:  No rashes or skin changes. Neuro:  No headache, numbness or weakness, balance or coordination issues. Endocrine:  No diabetes, thyroid issues, hot flashes  or night sweats. Psych:  No mood changes, depression or anxiety. Review of systems:  All other systems reviewed and found to be negative.   Physical Exam: Blood pressure 162/81, pulse 71, temperature 97.6 F (36.4 C), temperature source Tympanic, resp. rate 16, weight 202 lb 2.6 oz (91.7 kg).  GENERAL:  Well developed, well nourished, sitting comfortably in the exam room in no acute distress.  He has a cane at his side. MENTAL STATUS:  Alert and oriented to person, place and time. HEAD:  Wearing a cap.  Brown hair.  Mustache.  Normocephalic, atraumatic, face symmetric, no Cushingoid features. EYES:  Brown eyes.  Pupils equal round and reactive to light and accomodation.  No conjunctivitis or scleral icterus. ENT:  Oropharynx clear without lesion.  Tongue normal. Mucous membranes moist.  RESPIRATORY:  Clear to auscultation without rales, wheezes or rhonchi. CARDIOVASCULAR:  Regular rate and rhythm without murmur, rub or gallop. ABDOMEN:  Ileostomy with paper tape around edges.  Minimal stool at bottom of bag.  Soft, non-tender with active bowel sounds, and no hepatosplenomegaly.  No masses. SKIN:  No rashes, ulcers or lesions. EXTREMITIES: No edema, no skin discoloration or tenderness.  No palpable cords. LYMPH NODES: No palpable cervical, supraclavicular, axillary or inguinal adenopathy  NEUROLOGICAL: Unremarkable. PSYCH:  Appropriate.   Infusion on 01/27/2015  Component Date Value Ref Range Status  . Magnesium 01/27/2015 1.6* 1.7 - 2.4 mg/dL Final   A-LINE DRAW  . WBC 01/27/2015 4.9  3.8 - 10.6 K/uL Final   A-LINE DRAW  . RBC 01/27/2015 4.48  4.40 - 5.90 MIL/uL Final  . Hemoglobin 01/27/2015 10.1* 13.0 - 18.0 g/dL Final  . HCT 01/27/2015 31.7* 40.0 - 52.0 % Final  . MCV 01/27/2015 70.6* 80.0 - 100.0 fL Final  . MCH 01/27/2015 22.5* 26.0 - 34.0 pg Final  . MCHC 01/27/2015 31.8* 32.0 - 36.0 g/dL Final  . RDW 01/27/2015 17.8* 11.5 - 14.5 % Final  . Platelets 01/27/2015 231  150 - 440  K/uL Final  . Neutrophils Relative % 01/27/2015 80   Final  . Neutro Abs 01/27/2015 3.9  1.4 - 6.5 K/uL Final  . Lymphocytes Relative 01/27/2015 7   Final  . Lymphs Abs 01/27/2015 0.3* 1.0 - 3.6 K/uL Final  . Monocytes Relative 01/27/2015 9   Final  . Monocytes Absolute 01/27/2015 0.4  0.2 - 1.0 K/uL Final  . Eosinophils Relative 01/27/2015 3   Final  . Eosinophils Absolute 01/27/2015 0.2  0 - 0.7 K/uL Final  . Basophils Relative 01/27/2015 1   Final  . Basophils Absolute 01/27/2015 0.0  0 - 0.1 K/uL Final  . Sodium 01/27/2015 139  135 - 145 mmol/L Final   A-LINE DRAW  . Potassium 01/27/2015 3.7  3.5 - 5.1 mmol/L Final  . Chloride 01/27/2015 109  101 - 111 mmol/L Final  . CO2 01/27/2015 23  22 - 32 mmol/L Final  . Glucose,  Bld 01/27/2015 122* 65 - 99 mg/dL Final  . BUN 01/27/2015 25* 6 - 20 mg/dL Final  . Creatinine, Ser 01/27/2015 1.24  0.61 - 1.24 mg/dL Final  . Calcium 01/27/2015 8.8* 8.9 - 10.3 mg/dL Final  . Total Protein 01/27/2015 6.8  6.5 - 8.1 g/dL Final  . Albumin 01/27/2015 4.0  3.5 - 5.0 g/dL Final  . AST 01/27/2015 20  15 - 41 U/L Final  . ALT 01/27/2015 15* 17 - 63 U/L Final  . Alkaline Phosphatase 01/27/2015 76  38 - 126 U/L Final  . Total Bilirubin 01/27/2015 0.4  0.3 - 1.2 mg/dL Final  . GFR calc non Af Amer 01/27/2015 >60  >60 mL/min Final  . GFR calc Af Amer 01/27/2015 >60  >60 mL/min Final   Comment: (NOTE) The eGFR has been calculated using the CKD EPI equation. This calculation has not been validated in all clinical situations. eGFR's persistently <60 mL/min signify possible Chronic Kidney Disease.   Georgiann Hahn gap 01/27/2015 7  5 - 15 Final    Assessment:  Zannie Kehr [name] is a 59 y.o. male with clinical stage IIA (T3N0) rectal cancer.  Colonoscopy on 06/06/2014 revealed a large non-circumferential distal rectal mass. Biopsy was positive for adenocarcinoma.   CT scans revealed a 3 mm pulmonary nodule (indeterminate). There was a large rectal mass  suspicious for transmural extension into the mesorectum.There was equivocal perirectal and sigmoid mesocolon nodes. There was no liver metastasis or extrapelvic disease.  [facility]oscopic ultrasound at [facility] revealed a 70% circumferential mass at 9-14 cm from the anal verge and measuring 9 mm in maximal thickness. Ultrasound suggested breakthrough of the muscularis propria with invasion into the perirectal fat.  He received neoadjuvant chemotherapy (continuous infusion 5FU) and radiation. He received radiation from 07/03/2014 until 08/16/2014. He underwent low anterior resection with diverting loop ileostomy on 10/23/2014.  Pathology revealed a complete response.  Zero of 10 lymph nodes were positive.  He has iron deficiency anemia.  Ferritin was 34 on 10/04/2014.   Today's hemoglobin is 10.1 which is essentially stable.  He has significant peripheral vascular disease. A stent or bypass is plannned.   Proceed with cycle 3 of 12 of adjuvant FOLFOX chemotherapy. Return to clinic in 2 days for pump removal and then in 2 weeks for consideration of cycle 4. Patient understands he can return to clinic at any time if he has any questions, concerns, or complaints.   Lloyd Huger, MD  01/03/2015, 2:55 PM

## 2015-02-10 ENCOUNTER — Inpatient Hospital Stay: Payer: 59

## 2015-02-10 ENCOUNTER — Other Ambulatory Visit: Payer: Self-pay

## 2015-02-10 ENCOUNTER — Inpatient Hospital Stay (HOSPITAL_BASED_OUTPATIENT_CLINIC_OR_DEPARTMENT_OTHER): Payer: 59 | Admitting: Hematology and Oncology

## 2015-02-10 VITALS — BP 123/80 | HR 64 | Temp 97.7°F | Resp 18 | Ht 70.0 in | Wt 207.2 lb

## 2015-02-10 DIAGNOSIS — I739 Peripheral vascular disease, unspecified: Secondary | ICD-10-CM

## 2015-02-10 DIAGNOSIS — D509 Iron deficiency anemia, unspecified: Secondary | ICD-10-CM | POA: Diagnosis not present

## 2015-02-10 DIAGNOSIS — C801 Malignant (primary) neoplasm, unspecified: Secondary | ICD-10-CM

## 2015-02-10 DIAGNOSIS — C20 Malignant neoplasm of rectum: Secondary | ICD-10-CM

## 2015-02-10 DIAGNOSIS — Z79899 Other long term (current) drug therapy: Secondary | ICD-10-CM

## 2015-02-10 DIAGNOSIS — Z5111 Encounter for antineoplastic chemotherapy: Secondary | ICD-10-CM | POA: Diagnosis not present

## 2015-02-10 DIAGNOSIS — G629 Polyneuropathy, unspecified: Secondary | ICD-10-CM | POA: Diagnosis not present

## 2015-02-10 LAB — CBC WITH DIFFERENTIAL/PLATELET
Basophils Absolute: 0 10*3/uL (ref 0–0.1)
Basophils Relative: 1 %
Eosinophils Absolute: 0.2 10*3/uL (ref 0–0.7)
Eosinophils Relative: 3 %
HCT: 30.8 % — ABNORMAL LOW (ref 40.0–52.0)
Hemoglobin: 9.7 g/dL — ABNORMAL LOW (ref 13.0–18.0)
Lymphocytes Relative: 7 %
Lymphs Abs: 0.3 10*3/uL — ABNORMAL LOW (ref 1.0–3.6)
MCH: 22.8 pg — ABNORMAL LOW (ref 26.0–34.0)
MCHC: 31.6 g/dL — ABNORMAL LOW (ref 32.0–36.0)
MCV: 72.2 fL — ABNORMAL LOW (ref 80.0–100.0)
Monocytes Absolute: 0.5 10*3/uL (ref 0.2–1.0)
Monocytes Relative: 10 %
Neutro Abs: 3.7 10*3/uL (ref 1.4–6.5)
Neutrophils Relative %: 79 %
Platelets: 157 10*3/uL (ref 150–440)
RBC: 4.26 MIL/uL — ABNORMAL LOW (ref 4.40–5.90)
RDW: 18.6 % — ABNORMAL HIGH (ref 11.5–14.5)
WBC: 4.7 10*3/uL (ref 3.8–10.6)

## 2015-02-10 LAB — COMPREHENSIVE METABOLIC PANEL
ALT: 21 U/L (ref 17–63)
AST: 32 U/L (ref 15–41)
Albumin: 3.7 g/dL (ref 3.5–5.0)
Alkaline Phosphatase: 78 U/L (ref 38–126)
Anion gap: 6 (ref 5–15)
BUN: 22 mg/dL — ABNORMAL HIGH (ref 6–20)
CO2: 22 mmol/L (ref 22–32)
Calcium: 8.4 mg/dL — ABNORMAL LOW (ref 8.9–10.3)
Chloride: 108 mmol/L (ref 101–111)
Creatinine, Ser: 1.27 mg/dL — ABNORMAL HIGH (ref 0.61–1.24)
GFR calc Af Amer: >60 mL/min (ref >60)
GFR calc non Af Amer: 59 mL/min — ABNORMAL LOW (ref >60)
Glucose, Bld: 128 mg/dL — ABNORMAL HIGH (ref 65–99)
Potassium: 3.8 mmol/L (ref 3.5–5.1)
Sodium: 136 mmol/L (ref 135–145)
Total Bilirubin: 0.3 mg/dL (ref 0.3–1.2)
Total Protein: 6.4 g/dL — ABNORMAL LOW (ref 6.5–8.1)

## 2015-02-10 LAB — MAGNESIUM: Magnesium: 1.7 mg/dL (ref 1.7–2.4)

## 2015-02-10 MED ORDER — HEPARIN SOD (PORK) LOCK FLUSH 100 UNIT/ML IV SOLN: 500.0000 [IU] | Freq: Once | INTRAVENOUS | Status: DC | PRN

## 2015-02-10 MED ORDER — DEXAMETHASONE SODIUM PHOSPHATE 100 MG/10ML IJ SOLN
Freq: Once | INTRAMUSCULAR | Status: AC
  Administered 2015-02-10: 11:00:00 via INTRAVENOUS
  Filled 2015-02-10: qty 4

## 2015-02-10 MED ORDER — LEUCOVORIN CALCIUM INJECTION 350 MG
850.0000 mg | Freq: Once | INTRAVENOUS | Status: AC
  Administered 2015-02-10: 850 mg via INTRAVENOUS
  Filled 2015-02-10: qty 25

## 2015-02-10 MED ORDER — DEXTROSE 5 % IV SOLN
Freq: Once | INTRAVENOUS | Status: AC
  Administered 2015-02-10: 11:00:00 via INTRAVENOUS
  Filled 2015-02-10: qty 1000

## 2015-02-10 MED ORDER — FLUOROURACIL CHEMO INJECTION 2.5 GM/50ML
400.0000 mg/m2 | Freq: Once | INTRAVENOUS | Status: AC
  Administered 2015-02-10: 800 mg via INTRAVENOUS
  Filled 2015-02-10: qty 16

## 2015-02-10 MED ORDER — SODIUM CHLORIDE 0.9 % IV SOLN
2400.0000 mg/m2 | INTRAVENOUS | Status: DC
  Administered 2015-02-10: 4950 mg via INTRAVENOUS
  Filled 2015-02-10: qty 99

## 2015-02-10 MED ORDER — OXALIPLATIN CHEMO INJECTION 100 MG/20ML
85.0000 mg/m2 | Freq: Once | INTRAVENOUS | Status: AC
  Administered 2015-02-10: 175 mg via INTRAVENOUS
  Filled 2015-02-10: qty 35

## 2015-02-10 MED ORDER — SODIUM CHLORIDE 0.9 % IJ SOLN
10.0000 mL | INTRAMUSCULAR | Status: DC | PRN
  Administered 2015-02-10: 10 mL
  Filled 2015-02-10: qty 10

## 2015-02-10 NOTE — Progress Notes (Signed)
[facility] day:  02/10/2015   Chief Complaint: [name] UPCHURCH [name] is a 59 y.o. male with clinical stage IIA rectal cancer s/p neoadjuvant chemotherapy and radiation followed by low anterior resection who is seen for assessment prior to cycle #4 FOLFOX chemotherapy.  HPI:  The patient was last seen in the medical oncology clinic on 01/27/2015 by Dr. Delight Hoh in my absence.  At that time, he was doing well without significant side effect. 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  He received his chemotherapy uneventfully.  During the interim, he has felt "ok". He denies any diarrhea or mouth sores. He notes a "funny feeling" in his stomach. He describes a little tingling in his left hand right after chemotherapy. He also has a cold neuropathy.  Past Medical History  Diagnosis Date  . Rectal cancer   . Cancer   . Iron deficiency anemia   . Pulmonary nodules   . Hypertension   . Hyperlipidemia   . Hematuria   . Colon cancer   . Myocardial infarction   . Ureter, stricture   . Gout     Past Surgical History  Procedure Laterality Date  . Colonoscopy 06/06/14    . Colonoscopy with esophagogastroduodenoscopy (egd)    . Portacath placement  07/01/2014    Dr. Marina Gravel  . Coronary angioplasty with stent placement  03/2004  . Bowel resection N/A 10/23/2014    Procedure: LOW ANTERIOR BOWEL RESECTION;  Surgeon: Sherri Rad, MD;  Location: [facility];  Service: General;  Laterality: N/A;  . Laparotomy N/A 10/23/2014    Procedure: EXPLORATORY LAPAROTOMY;  Surgeon: Sherri Rad, MD;  Location: [facility];  Service: General;  Laterality: N/A;  . Diverting ileostomy N/A 10/23/2014    Procedure: DIVERTING ILEOSTOMY;  Surgeon: Sherri Rad, MD;  Location: [facility];  Service: General;  Laterality: N/A;  . Cystoscopy with stent placement Bilateral 10/23/2014    Procedure: CYSTOSCOPY WITH STENT PLACEMENT,URETHRAL DILATION,  LEFT RETROGRADE PYELOGRAM, URETEROSCOPY;  Surgeon: Hollice Espy, MD;  Location: [facility];  Service: Urology;  Laterality: Bilateral;    Family History  Problem Relation Age of Onset  . Hypertension Brother   . Hypertension Sister   . Hypertension Father   . Heart disease Paternal Grandfather   . Diabetes Father   . Diabetes Brother     Social History:  reports that he quit smoking about 30 years ago. He has never used smokeless tobacco. He reports that he does not drink alcohol or use illicit drugs.  The patient is accompanied by his wife, Meredith Mody, today.  Allergies:  Allergies  Allergen Reactions  . No Known Allergies     Current Medications: Current Outpatient Prescriptions  Medication Sig Dispense Refill  . aspirin EC 81 MG tablet Take by mouth.    . cloNIDine (CATAPRES) 0.2 MG tablet Take 0.2 mg by mouth 2 (two) times daily.    . colchicine 0.6 MG tablet Take 0.6 mg by mouth daily.    . vitamin E 400 UNIT capsule Take 400 Units by mouth 2 (two) times daily.     No current facility-administered medications for this visit.   Facility-Administered Medications Ordered in Other Visits  Medication Dose Route Frequency Provider Last Rate Last Dose  . sodium chloride 0.9 % injection 10 mL  10 mL Intravenous PRN Dallas Schimke, MD   10 mL at 08/26/14 1300  . sodium chloride 0.9 %  injection 10 mL  10 mL Intracatheter PRN Lequita Asal, MD   10 mL at 02/10/15 2119    Review of Systems:  GENERAL:  Feels ok.   No fevers, sweats.  Weight stable. PERFORMANCE STATUS (ECOG):  1 HEENT:  No visual changes, runny nose, sore throat, mouth sores or tenderness. Lungs: No shortness of breath or cough.  No hemoptysis. Cardiac:  No chest pain, palpitations, orthopnea, or PND. GI:  Ostomy working well.  No nausea, vomiting, diarrhea, constipation, melena or hematochezia. GU:  No urgency, frequency, dysuria, and intermittent hematuria. Musculoskeletal:  No back pain.  No joint pain.  No  muscle tenderness. Extremities:  No pain or swelling. Skin:  No rashes or skin changes. Neuro:  Cold neuropathy .  Little tingling in left hand for 2 days after chemo.  No headache, numbness or weakness, balance or coordination issues. Endocrine:  No diabetes, thyroid issues, hot flashes or night sweats. Psych:  No mood changes, depression or anxiety. Review of systems:  All other systems reviewed and found to be negative.   Physical Exam: Blood pressure 123/80, pulse 64, temperature 97.7 F (36.5 C), temperature source Tympanic, resp. rate 18, height _0  (1.778 m), weight 207 lb 3.7 oz (94 kg).  GENERAL:  Well developed, well nourished, sitting comfortably in the exam room in no acute distress.  He has a cane at his side. MENTAL STATUS:  Alert and oriented to person, place and time. HEAD:  Wearing a cap.  Brown hair.  Mustache.  Normocephalic, atraumatic, face symmetric, no Cushingoid features. EYES:  Brown eyes.  Pupils equal round and reactive to light and accomodation.  No conjunctivitis or scleral icterus. ENT:  Oropharynx clear without lesion.  Tongue normal. Mucous membranes moist.  RESPIRATORY:  Clear to auscultation without rales, wheezes or rhonchi. CARDIOVASCULAR:  Regular rate and rhythm without murmur, rub or gallop. ABDOMEN:  Ileostomy.  Brown stool in bag.  Soft, non-tender with active bowel sounds, and no hepatosplenomegaly.  No masses. SKIN:  No rashes, ulcers or lesions. EXTREMITIES: Nail beds dark post chemo.  No edema, no skin discoloration or tenderness.  No palpable cords. LYMPH NODES: No palpable cervical, supraclavicular, axillary or inguinal adenopathy  NEUROLOGICAL: Unremarkable. Bilateral patellar reflexes 0. PSYCH:  Appropriate.   Infusion on 02/10/2015  Component Date Value Ref Range Status  . WBC 02/10/2015 4.7  3.8 - 10.6 K/uL Final   A-LINE DRAW  . RBC 02/10/2015 4.26* 4.40 - 5.90 MIL/uL Final  . Hemoglobin 02/10/2015 9.7* 13.0 - 18.0 g/dL Final  .  HCT 02/10/2015 30.8* 40.0 - 52.0 % Final  . MCV 02/10/2015 72.2* 80.0 - 100.0 fL Final  . MCH 02/10/2015 22.8* 26.0 - 34.0 pg Final  . MCHC 02/10/2015 31.6* 32.0 - 36.0 g/dL Final  . RDW 02/10/2015 18.6* 11.5 - 14.5 % Final  . Platelets 02/10/2015 157  150 - 440 K/uL Final  . Neutrophils Relative % 02/10/2015 79   Final  . Neutro Abs 02/10/2015 3.7  1.4 - 6.5 K/uL Final  . Lymphocytes Relative 02/10/2015 7   Final  . Lymphs Abs 02/10/2015 0.3* 1.0 - 3.6 K/uL Final  . Monocytes Relative 02/10/2015 10   Final  . Monocytes Absolute 02/10/2015 0.5  0.2 - 1.0 K/uL Final  . Eosinophils Relative 02/10/2015 3   Final  . Eosinophils Absolute 02/10/2015 0.2  0 - 0.7 K/uL Final  . Basophils Relative 02/10/2015 1   Final  . Basophils Absolute 02/10/2015 0.0  0 -  0.1 K/uL Final  . Sodium 02/10/2015 136  135 - 145 mmol/L Final  . Potassium 02/10/2015 3.8  3.5 - 5.1 mmol/L Final  . Chloride 02/10/2015 108  101 - 111 mmol/L Final  . CO2 02/10/2015 22  22 - 32 mmol/L Final  . Glucose, Bld 02/10/2015 128* 65 - 99 mg/dL Final  . BUN 02/10/2015 22* 6 - 20 mg/dL Final  . Creatinine, Ser 02/10/2015 1.27* 0.61 - 1.24 mg/dL Final  . Calcium 02/10/2015 8.4* 8.9 - 10.3 mg/dL Final  . Total Protein 02/10/2015 6.4* 6.5 - 8.1 g/dL Final  . Albumin 02/10/2015 3.7  3.5 - 5.0 g/dL Final  . AST 02/10/2015 32  15 - 41 U/L Final  . ALT 02/10/2015 21  17 - 63 U/L Final  . Alkaline Phosphatase 02/10/2015 78  38 - 126 U/L Final  . Total Bilirubin 02/10/2015 0.3  0.3 - 1.2 mg/dL Final  . GFR calc non Af Amer 02/10/2015 59* >60 mL/min Final  . GFR calc Af Amer 02/10/2015 >60  >60 mL/min Final   Comment: (NOTE) The eGFR has been calculated using the CKD EPI equation. This calculation has not been validated in all clinical situations. eGFR's persistently <60 mL/min signify possible Chronic Kidney Disease.   . Anion gap 02/10/2015 6  5 - 15 Final  . Magnesium 02/10/2015 1.7  1.7 - 2.4 mg/dL Final    Assessment:   [name] CANSLER [name] is a 59 y.o. male with clinical stage IIA (T3N0) rectal cancer.  Colonoscopy on 06/06/2014 revealed a large non-circumferential distal rectal mass. Biopsy was positive for adenocarcinoma.   CT scans revealed a 3 mm pulmonary nodule (indeterminate). There was a large rectal mass suspicious for transmural extension into the mesorectum.There was equivocal perirectal and sigmoid mesocolon nodes. There was no liver metastasis or extrapelvic disease.  [facility]oscopic [facility] at [facility] revealed a 70% circumferential mass at 9-14 cm from the anal verge and measuring 9 mm in maximal thickness. Ultrasound suggested breakthrough of the muscularis propria with invasion into the perirectal fat.  He received neoadjuvant chemotherapy (continuous infusion 5FU) and radiation. He received radiation from 07/03/2014 until 08/16/2014. He underwent low anterior resection with diverting loop ileostomy on 10/23/2014.  Pathology revealed a complete response.  Zero of 10 lymph nodes were positive.  He has iron deficiency anemia.  Ferritin was 34 on 10/04/2014.  He is on oral iron.  He has significant peripheral vascular disease. A stent or bypass is plannned.   He is currently status post 3 cycles of  FOLFOX chemotherapy (12/27/2014 - 01/27/2015).  He is tolerating his chemotherapy well.  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  Plan: 1.  Labs today:  CBC with diff, CMP, Mg. 2.  Cycle #4 of 8 FOLFOX today. 3.  RTC in 2 days for pump disconnect. 4.  RTC in 2 weeks for MD assess, labs (CBC with diff, CMP, Mg), and cycle #5 FOLFOX chemotherapy.   Lequita Asal, MD  02/10/2015, 10:23 AM

## 2015-02-10 NOTE — Progress Notes (Signed)
Patient is here for follow-up of rectal cancer and folfox treatment. Patient's appetite has been very good and he has gained about five pounds since his last visit here two weeks ago. Patient states that his stomach has been bothering him some and describes it has an "uneasy feeling". He denies any abdominal pain, constipation, or diarrhea.

## 2015-02-12 ENCOUNTER — Inpatient Hospital Stay: Payer: 59

## 2015-02-12 VITALS — BP 152/80 | HR 60 | Temp 97.0°F

## 2015-02-12 DIAGNOSIS — C20 Malignant neoplasm of rectum: Secondary | ICD-10-CM

## 2015-02-12 DIAGNOSIS — Z5111 Encounter for antineoplastic chemotherapy: Secondary | ICD-10-CM | POA: Diagnosis not present

## 2015-02-12 MED ORDER — HEPARIN SOD (PORK) LOCK FLUSH 100 UNIT/ML IV SOLN
500.0000 [IU] | Freq: Once | INTRAVENOUS | Status: AC | PRN
  Administered 2015-02-12: 500 [IU]
  Filled 2015-02-12: qty 5

## 2015-02-12 MED ORDER — SODIUM CHLORIDE 0.9 % IJ SOLN
10.0000 mL | INTRAMUSCULAR | Status: DC | PRN
  Administered 2015-02-12: 10 mL
  Filled 2015-02-12: qty 10

## 2015-02-23 ENCOUNTER — Encounter: Payer: Self-pay | Admitting: Hematology and Oncology

## 2015-02-23 ENCOUNTER — Other Ambulatory Visit: Payer: Self-pay | Admitting: Hematology and Oncology

## 2015-02-24 ENCOUNTER — Inpatient Hospital Stay: Payer: 59 | Attending: Hematology and Oncology

## 2015-02-24 ENCOUNTER — Inpatient Hospital Stay (HOSPITAL_BASED_OUTPATIENT_CLINIC_OR_DEPARTMENT_OTHER): Payer: 59 | Admitting: Hematology and Oncology

## 2015-02-24 ENCOUNTER — Inpatient Hospital Stay: Payer: 59

## 2015-02-24 VITALS — BP 130/64 | HR 70 | Temp 96.4°F | Resp 18 | Ht 70.0 in | Wt 205.7 lb

## 2015-02-24 DIAGNOSIS — E785 Hyperlipidemia, unspecified: Secondary | ICD-10-CM | POA: Insufficient documentation

## 2015-02-24 DIAGNOSIS — C20 Malignant neoplasm of rectum: Secondary | ICD-10-CM | POA: Insufficient documentation

## 2015-02-24 DIAGNOSIS — D509 Iron deficiency anemia, unspecified: Secondary | ICD-10-CM

## 2015-02-24 DIAGNOSIS — Z7982 Long term (current) use of aspirin: Secondary | ICD-10-CM | POA: Insufficient documentation

## 2015-02-24 DIAGNOSIS — M109 Gout, unspecified: Secondary | ICD-10-CM | POA: Insufficient documentation

## 2015-02-24 DIAGNOSIS — Z87891 Personal history of nicotine dependence: Secondary | ICD-10-CM | POA: Insufficient documentation

## 2015-02-24 DIAGNOSIS — Z5111 Encounter for antineoplastic chemotherapy: Secondary | ICD-10-CM | POA: Insufficient documentation

## 2015-02-24 DIAGNOSIS — T451X5A Adverse effect of antineoplastic and immunosuppressive drugs, initial encounter: Secondary | ICD-10-CM | POA: Diagnosis not present

## 2015-02-24 DIAGNOSIS — I10 Essential (primary) hypertension: Secondary | ICD-10-CM | POA: Diagnosis not present

## 2015-02-24 DIAGNOSIS — Z79899 Other long term (current) drug therapy: Secondary | ICD-10-CM | POA: Diagnosis not present

## 2015-02-24 DIAGNOSIS — I252 Old myocardial infarction: Secondary | ICD-10-CM | POA: Insufficient documentation

## 2015-02-24 DIAGNOSIS — R918 Other nonspecific abnormal finding of lung field: Secondary | ICD-10-CM

## 2015-02-24 DIAGNOSIS — I739 Peripheral vascular disease, unspecified: Secondary | ICD-10-CM | POA: Diagnosis not present

## 2015-02-24 DIAGNOSIS — G620 Drug-induced polyneuropathy: Secondary | ICD-10-CM | POA: Insufficient documentation

## 2015-02-24 DIAGNOSIS — Z85038 Personal history of other malignant neoplasm of large intestine: Secondary | ICD-10-CM | POA: Diagnosis not present

## 2015-02-24 DIAGNOSIS — T451X5S Adverse effect of antineoplastic and immunosuppressive drugs, sequela: Secondary | ICD-10-CM | POA: Diagnosis not present

## 2015-02-24 LAB — COMPREHENSIVE METABOLIC PANEL
ALT: 30 U/L (ref 17–63)
AST: 38 U/L (ref 15–41)
Albumin: 3.6 g/dL (ref 3.5–5.0)
Alkaline Phosphatase: 81 U/L (ref 38–126)
Anion gap: 6 (ref 5–15)
BUN: 21 mg/dL — ABNORMAL HIGH (ref 6–20)
CO2: 24 mmol/L (ref 22–32)
Calcium: 9 mg/dL (ref 8.9–10.3)
Chloride: 105 mmol/L (ref 101–111)
Creatinine, Ser: 1.21 mg/dL (ref 0.61–1.24)
GFR calc Af Amer: >60 mL/min (ref >60)
GFR calc non Af Amer: >60 mL/min (ref >60)
Glucose, Bld: 118 mg/dL — ABNORMAL HIGH (ref 65–99)
Potassium: 4 mmol/L (ref 3.5–5.1)
Sodium: 135 mmol/L (ref 135–145)
Total Bilirubin: 0.3 mg/dL (ref 0.3–1.2)
Total Protein: 6.7 g/dL (ref 6.5–8.1)

## 2015-02-24 LAB — CBC WITH DIFFERENTIAL/PLATELET
Basophils Absolute: 0 10*3/uL (ref 0–0.1)
Basophils Relative: 1 %
Eosinophils Absolute: 0.2 10*3/uL (ref 0–0.7)
Eosinophils Relative: 4 %
HCT: 30.8 % — ABNORMAL LOW (ref 40.0–52.0)
Hemoglobin: 9.7 g/dL — ABNORMAL LOW (ref 13.0–18.0)
Lymphocytes Relative: 8 %
Lymphs Abs: 0.3 10*3/uL — ABNORMAL LOW (ref 1.0–3.6)
MCH: 22.8 pg — ABNORMAL LOW (ref 26.0–34.0)
MCHC: 31.3 g/dL — ABNORMAL LOW (ref 32.0–36.0)
MCV: 72.9 fL — ABNORMAL LOW (ref 80.0–100.0)
Monocytes Absolute: 0.4 10*3/uL (ref 0.2–1.0)
Monocytes Relative: 11 %
Neutro Abs: 3 10*3/uL (ref 1.4–6.5)
Neutrophils Relative %: 76 %
Platelets: 168 10*3/uL (ref 150–440)
RBC: 4.23 MIL/uL — ABNORMAL LOW (ref 4.40–5.90)
RDW: 20.8 % — ABNORMAL HIGH (ref 11.5–14.5)
WBC: 3.9 10*3/uL (ref 3.8–10.6)

## 2015-02-24 LAB — MAGNESIUM: Magnesium: 1.6 mg/dL — ABNORMAL LOW (ref 1.7–2.4)

## 2015-02-24 MED ORDER — FLUOROURACIL CHEMO INJECTION 2.5 GM/50ML
400.0000 mg/m2 | Freq: Once | INTRAVENOUS | Status: AC
  Administered 2015-02-24: 800 mg via INTRAVENOUS
  Filled 2015-02-24: qty 16

## 2015-02-24 MED ORDER — HEPARIN SOD (PORK) LOCK FLUSH 100 UNIT/ML IV SOLN: 500.0000 [IU] | Freq: Once | INTRAVENOUS | Status: DC

## 2015-02-24 MED ORDER — HEPARIN SOD (PORK) LOCK FLUSH 100 UNIT/ML IV SOLN: 500.0000 [IU] | Freq: Once | INTRAVENOUS | Status: DC | PRN

## 2015-02-24 MED ORDER — SODIUM CHLORIDE 0.9 % IV SOLN
Freq: Once | INTRAVENOUS | Status: AC
  Administered 2015-02-24: 11:00:00 via INTRAVENOUS
  Filled 2015-02-24: qty 4

## 2015-02-24 MED ORDER — SODIUM CHLORIDE 0.9 % IJ SOLN
10.0000 mL | INTRAMUSCULAR | Status: DC | PRN
  Administered 2015-02-24: 10 mL via INTRAVENOUS
  Filled 2015-02-24: qty 10

## 2015-02-24 MED ORDER — DEXTROSE 5 % IV SOLN
850.0000 mg | Freq: Once | INTRAVENOUS | Status: AC
  Administered 2015-02-24: 850 mg via INTRAVENOUS
  Filled 2015-02-24: qty 25

## 2015-02-24 MED ORDER — SODIUM CHLORIDE 0.9 % IV SOLN
2400.0000 mg/m2 | INTRAVENOUS | Status: AC
  Administered 2015-02-24: 4950 mg via INTRAVENOUS
  Filled 2015-02-24: qty 99

## 2015-02-24 MED ORDER — OXALIPLATIN CHEMO INJECTION 100 MG/20ML
85.0000 mg/m2 | Freq: Once | INTRAVENOUS | Status: AC
  Administered 2015-02-24: 175 mg via INTRAVENOUS
  Filled 2015-02-24: qty 35

## 2015-02-24 MED ORDER — DEXTROSE 5 % IV SOLN
Freq: Once | INTRAVENOUS | Status: AC
  Administered 2015-02-24: 11:00:00 via INTRAVENOUS
  Filled 2015-02-24: qty 1000

## 2015-02-24 NOTE — Progress Notes (Signed)
[facility] day:  02/24/2015   Chief Complaint: [name] KEMPPAINEN [name] is a 59 y.o. male with clinical stage IIA rectal cancer s/p neoadjuvant chemotherapy and radiation followed by low anterior resection who is seen for assessment prior to cycle #5 FOLFOX chemotherapy.  HPI:  The patient was last seen in the medical oncology clinic on 02/10/2015.  At that time, he felt "ok". He denied any diarrhea or mouth sores. He noted a "funny feeling" in his stomach. He described a little tingling in his left hand right after chemotherapy. He also had a cold neuropathy.  He received cycle #4 FOLFOX chemotherapy.  During the interim, he notes that his feet are bothering him a little. He notes that his stomach is sore and queasy. He has had some  recent issues with gout. He notes a cold neuropathy 2 days after chemotherapy. This neuropathy typically goes away after a week.  Past Medical History  Diagnosis Date  . Rectal cancer ([place])   . Cancer ([place])   . Iron deficiency anemia   . Pulmonary nodules   . Hypertension   . Hyperlipidemia   . Hematuria   . Colon cancer ([place])   . Myocardial infarction ([place])   . Ureter, stricture   . Gout     Past Surgical History  Procedure Laterality Date  . Colonoscopy 06/06/14    . Colonoscopy with esophagogastroduodenoscopy (egd)    . Portacath placement  07/01/2014    Dr. Marina Gravel  . Coronary angioplasty with stent placement  03/2004  . Bowel resection N/A 10/23/2014    Procedure: LOW ANTERIOR BOWEL RESECTION;  Surgeon: Sherri Rad, MD;  Location: [facility];  Service: General;  Laterality: N/A;  . Laparotomy N/A 10/23/2014    Procedure: EXPLORATORY LAPAROTOMY;  Surgeon: Sherri Rad, MD;  Location: [facility];  Service: General;  Laterality: N/A;  . Diverting ileostomy N/A 10/23/2014    Procedure: DIVERTING ILEOSTOMY;  Surgeon: Sherri Rad, MD;  Location: [facility];  Service: General;  Laterality: N/A;  . Cystoscopy with stent placement  Bilateral 10/23/2014    Procedure: CYSTOSCOPY WITH STENT PLACEMENT,URETHRAL DILATION, LEFT RETROGRADE PYELOGRAM, URETEROSCOPY;  Surgeon: Hollice Espy, MD;  Location: [facility];  Service: Urology;  Laterality: Bilateral;    Family History  Problem Relation Age of Onset  . Hypertension Brother   . Hypertension Sister   . Hypertension Father   . Heart disease Paternal Grandfather   . Diabetes Father   . Diabetes Brother     Social History:  reports that he quit smoking about 30 years ago. He has never used smokeless tobacco. He reports that he does not drink alcohol or use illicit drugs.  The patient is accompanied by his wife, Meredith Mody, today.  Allergies:  Allergies  Allergen Reactions  . No Known Allergies     Current Medications: Current Outpatient Prescriptions  Medication Sig Dispense Refill  . aspirin EC 81 MG tablet Take by mouth.    . cloNIDine (CATAPRES) 0.2 MG tablet Take 0.2 mg by mouth 2 (two) times daily.    . colchicine 0.6 MG tablet Take 0.6 mg by mouth daily.    . vitamin E 400 UNIT capsule Take 400 Units by mouth 2 (two) times daily.     No current facility-administered medications for this visit.   Facility-Administered Medications Ordered in Other Visits  Medication Dose Route Frequency Provider Last Rate Last Dose  . heparin lock flush 100 unit/mL  500 Units Intravenous  Once Lequita Asal, MD      . heparin lock flush 100 unit/mL  500 Units Intracatheter Once PRN Lequita Asal, MD      . sodium chloride 0.9 % injection 10 mL  10 mL Intravenous PRN Dallas Schimke, MD   10 mL at 08/26/14 1300  . sodium chloride 0.9 % injection 10 mL  10 mL Intravenous PRN Lequita Asal, MD   10 mL at 02/24/15 8127    Review of Systems:  GENERAL:  Feels ok.   No fevers, sweats.  Weight stable. PERFORMANCE STATUS (ECOG):  1 HEENT:  No visual changes, runny nose, sore throat, mouth sores or tenderness. Lungs: No shortness of breath or cough.  No hemoptysis. Cardiac:   No chest pain, palpitations, orthopnea, or PND. GI:  Ostomy working well.  Little queezy.  No vomiting, diarrhea, constipation, melena or hematochezia. GU:  No urgency, frequency, dysuria, and intermittent hematuria. Musculoskeletal:  No back pain.  No joint pain.  No muscle tenderness. Extremities:  Feet bothering him a little.  No pain or swelling. Skin:  No rashes or skin changes. Neuro:  Cold neuropathy lasts 1 week after chemotherapy.  No headache, numbness or weakness, balance or coordination issues. Endocrine:  No diabetes, thyroid issues, hot flashes or night sweats. Psych:  No mood changes, depression or anxiety. Review of systems:  All other systems reviewed and found to be negative.   Physical Exam: Blood pressure 130/64, pulse 70, temperature 96.4 F (35.8 C), resp. rate 18, height '5\' 10"'  (1.778 m), weight 205 lb 11 oz (93.3 kg).  GENERAL:  Well developed, well nourished, sitting comfortably in the exam room in no acute distress.  He has a cane at his side. MENTAL STATUS:  Alert and oriented to person, place and time. HEAD:  Wearing a cap.  Brown hair.  Mustache.  Normocephalic, atraumatic, face symmetric, no Cushingoid features. EYES:  Brown eyes.  Pupils equal round and reactive to light and accomodation.  No conjunctivitis or scleral icterus. ENT:  Oropharynx clear without lesion.  Tongue normal. Mucous membranes moist.  RESPIRATORY:  Clear to auscultation without rales, wheezes or rhonchi. CARDIOVASCULAR:  Regular rate and rhythm without murmur, rub or gallop. ABDOMEN:  Ileostomy.  Brown stool in bag.  Soft, non-tender with active bowel sounds, and no hepatosplenomegaly.  No masses. SKIN:  No rashes, ulcers or lesions. EXTREMITIES: Nail beds dark post chemo.  No edema, no skin discoloration or tenderness.  No palpable cords. LYMPH NODES: No palpable cervical, supraclavicular, axillary or inguinal adenopathy  NEUROLOGICAL: Unremarkable. Bilateral patellar reflexes 0. PSYCH:   Appropriate.   Infusion on 02/24/2015  Component Date Value Ref Range Status  . WBC 02/24/2015 3.9  3.8 - 10.6 K/uL Final  . RBC 02/24/2015 4.23* 4.40 - 5.90 MIL/uL Final  . Hemoglobin 02/24/2015 9.7* 13.0 - 18.0 g/dL Final  . HCT 02/24/2015 30.8* 40.0 - 52.0 % Final  . MCV 02/24/2015 72.9* 80.0 - 100.0 fL Final  . MCH 02/24/2015 22.8* 26.0 - 34.0 pg Final  . MCHC 02/24/2015 31.3* 32.0 - 36.0 g/dL Final  . RDW 02/24/2015 20.8* 11.5 - 14.5 % Final  . Platelets 02/24/2015 168  150 - 440 K/uL Final  . Neutrophils Relative % 02/24/2015 76   Final  . Neutro Abs 02/24/2015 3.0  1.4 - 6.5 K/uL Final  . Lymphocytes Relative 02/24/2015 8   Final  . Lymphs Abs 02/24/2015 0.3* 1.0 - 3.6 K/uL Final  . Monocytes Relative 02/24/2015  11   Final  . Monocytes Absolute 02/24/2015 0.4  0.2 - 1.0 K/uL Final  . Eosinophils Relative 02/24/2015 4   Final  . Eosinophils Absolute 02/24/2015 0.2  0 - 0.7 K/uL Final  . Basophils Relative 02/24/2015 1   Final  . Basophils Absolute 02/24/2015 0.0  0 - 0.1 K/uL Final  . Sodium 02/24/2015 135  135 - 145 mmol/L Final  . Potassium 02/24/2015 4.0  3.5 - 5.1 mmol/L Final  . Chloride 02/24/2015 105  101 - 111 mmol/L Final  . CO2 02/24/2015 24  22 - 32 mmol/L Final  . Glucose, Bld 02/24/2015 118* 65 - 99 mg/dL Final  . BUN 02/24/2015 21* 6 - 20 mg/dL Final  . Creatinine, Ser 02/24/2015 1.21  0.61 - 1.24 mg/dL Final  . Calcium 02/24/2015 9.0  8.9 - 10.3 mg/dL Final  . Total Protein 02/24/2015 6.7  6.5 - 8.1 g/dL Final  . Albumin 02/24/2015 3.6  3.5 - 5.0 g/dL Final  . AST 02/24/2015 38  15 - 41 U/L Final  . ALT 02/24/2015 30  17 - 63 U/L Final  . Alkaline Phosphatase 02/24/2015 81  38 - 126 U/L Final  . Total Bilirubin 02/24/2015 0.3  0.3 - 1.2 mg/dL Final  . GFR calc non Af Amer 02/24/2015 >60  >60 mL/min Final  . GFR calc Af Amer 02/24/2015 >60  >60 mL/min Final   Comment: (NOTE) The eGFR has been calculated using the CKD EPI equation. This calculation has not  been validated in all clinical situations. eGFR's persistently <60 mL/min signify possible Chronic Kidney Disease.   . Anion gap 02/24/2015 6  5 - 15 Final  . Magnesium 02/24/2015 1.6* 1.7 - 2.4 mg/dL Final    Assessment:  [name] OREGEL [name] is a 59 y.o. male with clinical stage IIA (T3N0) rectal cancer.  Colonoscopy on 06/06/2014 revealed a large non-circumferential distal rectal mass. Biopsy was positive for adenocarcinoma.   CT scans revealed a 3 mm pulmonary nodule (indeterminate). There was a large rectal mass suspicious for transmural extension into the mesorectum.There was equivocal perirectal and sigmoid mesocolon nodes. There was no liver metastasis or extrapelvic disease.  Endoscopic ultrasound at [facility] revealed a 70% circumferential mass at 9-14 cm from the anal verge and measuring 9 mm in maximal thickness. Ultrasound suggested breakthrough of the muscularis propria with invasion into the perirectal fat.  He received neoadjuvant chemotherapy (continuous infusion 5FU) and radiation. He received radiation from 07/03/2014 until 08/16/2014. He underwent low anterior resection with diverting loop ileostomy on 10/23/2014.  Pathology revealed a complete response.  Zero of 10 lymph nodes were positive.  He has iron deficiency anemia.  Ferritin was 34 on 10/04/2014.  He is on oral iron.  He has significant peripheral vascular disease. A stent or bypass is plannned.   He is currently status post 4 cycles of  FOLFOX chemotherapy (12/27/2014 - 02/10/2015).  He is tolerating his chemotherapy well.  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  He notes a little nausea.  He denies any diarrhea.  Plan: 1.  Labs today:  CBC with diff, CMP, Mg. 2.  Cycle #5 of 8 FOLFOX today. 3.  RTC in 2 days for pump disconnect. 4.  RTC in 2 weeks for MD assessment, labs (CBC with diff, CMP, Mg), and cycle #6 FOLFOX chemotherapy.   Lequita Asal, MD  02/24/2015, 9:00 AM

## 2015-02-26 ENCOUNTER — Inpatient Hospital Stay: Payer: 59

## 2015-02-26 DIAGNOSIS — C801 Malignant (primary) neoplasm, unspecified: Secondary | ICD-10-CM

## 2015-02-26 DIAGNOSIS — Z5111 Encounter for antineoplastic chemotherapy: Secondary | ICD-10-CM | POA: Diagnosis not present

## 2015-02-26 MED ORDER — HEPARIN SOD (PORK) LOCK FLUSH 100 UNIT/ML IV SOLN
500.0000 [IU] | Freq: Once | INTRAVENOUS | Status: AC
  Administered 2015-02-26: 500 [IU] via INTRAVENOUS

## 2015-02-26 MED ORDER — SODIUM CHLORIDE 0.9 % IJ SOLN
10.0000 mL | INTRAMUSCULAR | Status: DC | PRN
  Administered 2015-02-26: 10 mL via INTRAVENOUS
  Filled 2015-02-26: qty 10

## 2015-02-26 MED ORDER — HEPARIN SOD (PORK) LOCK FLUSH 100 UNIT/ML IV SOLN
INTRAVENOUS | Status: AC
Start: 2015-02-26 — End: 2015-02-26
  Filled 2015-02-26: qty 5

## 2015-03-05 ENCOUNTER — Other Ambulatory Visit: Payer: Self-pay

## 2015-03-05 DIAGNOSIS — C20 Malignant neoplasm of rectum: Secondary | ICD-10-CM

## 2015-03-06 ENCOUNTER — Ambulatory Visit: Payer: Managed Care, Other (non HMO) | Admitting: Urology

## 2015-03-08 ENCOUNTER — Other Ambulatory Visit: Payer: Self-pay | Admitting: Hematology and Oncology

## 2015-03-09 ENCOUNTER — Encounter: Payer: Self-pay | Admitting: Hematology and Oncology

## 2015-03-10 ENCOUNTER — Inpatient Hospital Stay: Payer: 59

## 2015-03-10 ENCOUNTER — Ambulatory Visit
Admission: RE | Admit: 2015-03-10 | Discharge: 2015-03-10 | Disposition: A | Discharge status: Home / Self Care | Payer: 59 | Source: Ambulatory Visit | Attending: Hematology and Oncology | Admitting: Hematology and Oncology

## 2015-03-10 ENCOUNTER — Inpatient Hospital Stay (HOSPITAL_BASED_OUTPATIENT_CLINIC_OR_DEPARTMENT_OTHER): Payer: 59 | Admitting: Hematology and Oncology

## 2015-03-10 ENCOUNTER — Other Ambulatory Visit: Payer: Self-pay | Admitting: *Deleted

## 2015-03-10 VITALS — BP 158/89 | HR 74 | Temp 96.4°F | Resp 18 | Ht 70.0 in | Wt 202.8 lb

## 2015-03-10 DIAGNOSIS — Z9689 Presence of other specified functional implants: Secondary | ICD-10-CM | POA: Insufficient documentation

## 2015-03-10 DIAGNOSIS — C20 Malignant neoplasm of rectum: Secondary | ICD-10-CM

## 2015-03-10 DIAGNOSIS — G620 Drug-induced polyneuropathy: Secondary | ICD-10-CM

## 2015-03-10 DIAGNOSIS — T451X5S Adverse effect of antineoplastic and immunosuppressive drugs, sequela: Secondary | ICD-10-CM

## 2015-03-10 DIAGNOSIS — R918 Other nonspecific abnormal finding of lung field: Secondary | ICD-10-CM

## 2015-03-10 DIAGNOSIS — Z79899 Other long term (current) drug therapy: Secondary | ICD-10-CM

## 2015-03-10 DIAGNOSIS — D509 Iron deficiency anemia, unspecified: Secondary | ICD-10-CM

## 2015-03-10 DIAGNOSIS — Z85038 Personal history of other malignant neoplasm of large intestine: Secondary | ICD-10-CM

## 2015-03-10 DIAGNOSIS — I739 Peripheral vascular disease, unspecified: Secondary | ICD-10-CM

## 2015-03-10 DIAGNOSIS — Z5111 Encounter for antineoplastic chemotherapy: Secondary | ICD-10-CM | POA: Diagnosis not present

## 2015-03-10 LAB — CBC WITH DIFFERENTIAL/PLATELET
Basophils Absolute: 0 10*3/uL (ref 0–0.1)
Basophils Relative: 1 %
Eosinophils Absolute: 0.1 10*3/uL (ref 0–0.7)
Eosinophils Relative: 3 %
HCT: 30.9 % — ABNORMAL LOW (ref 40.0–52.0)
Hemoglobin: 9.4 g/dL — ABNORMAL LOW (ref 13.0–18.0)
Lymphocytes Relative: 9 %
Lymphs Abs: 0.4 10*3/uL — ABNORMAL LOW (ref 1.0–3.6)
MCH: 22.8 pg — ABNORMAL LOW (ref 26.0–34.0)
MCHC: 30.4 g/dL — ABNORMAL LOW (ref 32.0–36.0)
MCV: 74.9 fL — ABNORMAL LOW (ref 80.0–100.0)
Monocytes Absolute: 0.5 10*3/uL (ref 0.2–1.0)
Monocytes Relative: 12 %
Neutro Abs: 3 10*3/uL (ref 1.4–6.5)
Neutrophils Relative %: 75 %
Platelets: 141 10*3/uL — ABNORMAL LOW (ref 150–440)
RBC: 4.12 MIL/uL — ABNORMAL LOW (ref 4.40–5.90)
RDW: 22.4 % — ABNORMAL HIGH (ref 11.5–14.5)
WBC: 4 10*3/uL (ref 3.8–10.6)

## 2015-03-10 LAB — COMPREHENSIVE METABOLIC PANEL
ALT: 30 U/L (ref 17–63)
AST: 37 U/L (ref 15–41)
Albumin: 3.7 g/dL (ref 3.5–5.0)
Alkaline Phosphatase: 80 U/L (ref 38–126)
Anion gap: 9 (ref 5–15)
BUN: 21 mg/dL — ABNORMAL HIGH (ref 6–20)
CO2: 24 mmol/L (ref 22–32)
Calcium: 9.5 mg/dL (ref 8.9–10.3)
Chloride: 106 mmol/L (ref 101–111)
Creatinine, Ser: 1.21 mg/dL (ref 0.61–1.24)
GFR calc Af Amer: >60 mL/min (ref >60)
GFR calc non Af Amer: >60 mL/min (ref >60)
Glucose, Bld: 104 mg/dL — ABNORMAL HIGH (ref 65–99)
Potassium: 3.6 mmol/L (ref 3.5–5.1)
Sodium: 139 mmol/L (ref 135–145)
Total Bilirubin: 0.4 mg/dL (ref 0.3–1.2)
Total Protein: 7.2 g/dL (ref 6.5–8.1)

## 2015-03-10 LAB — MAGNESIUM: Magnesium: 1.8 mg/dL (ref 1.7–2.4)

## 2015-03-10 MED ORDER — HEPARIN SOD (PORK) LOCK FLUSH 100 UNIT/ML IV SOLN
500.0000 [IU] | Freq: Once | INTRAVENOUS | Status: AC
  Administered 2015-03-10: 500 [IU] via INTRAVENOUS
  Filled 2015-03-10: qty 5

## 2015-03-10 MED ORDER — SODIUM CHLORIDE 0.9 % IJ SOLN
10.0000 mL | INTRAMUSCULAR | Status: DC | PRN
  Administered 2015-03-10: 10 mL via INTRAVENOUS
  Filled 2015-03-10: qty 10

## 2015-03-17 ENCOUNTER — Inpatient Hospital Stay: Payer: 59

## 2015-03-17 ENCOUNTER — Other Ambulatory Visit: Payer: Self-pay | Admitting: Hematology and Oncology

## 2015-03-17 VITALS — BP 107/68 | HR 71 | Temp 97.3°F | Resp 20

## 2015-03-17 DIAGNOSIS — Z5111 Encounter for antineoplastic chemotherapy: Secondary | ICD-10-CM | POA: Diagnosis not present

## 2015-03-17 DIAGNOSIS — C20 Malignant neoplasm of rectum: Secondary | ICD-10-CM

## 2015-03-17 LAB — CBC WITH DIFFERENTIAL/PLATELET
Basophils Absolute: 0.1 10*3/uL (ref 0–0.1)
Basophils Relative: 2 %
Eosinophils Absolute: 0.1 10*3/uL (ref 0–0.7)
Eosinophils Relative: 3 %
HCT: 31.1 % — ABNORMAL LOW (ref 40.0–52.0)
Hemoglobin: 9.8 g/dL — ABNORMAL LOW (ref 13.0–18.0)
Lymphocytes Relative: 12 %
Lymphs Abs: 0.4 10*3/uL — ABNORMAL LOW (ref 1.0–3.6)
MCH: 23.6 pg — ABNORMAL LOW (ref 26.0–34.0)
MCHC: 31.5 g/dL — ABNORMAL LOW (ref 32.0–36.0)
MCV: 74.9 fL — ABNORMAL LOW (ref 80.0–100.0)
Monocytes Absolute: 0.4 10*3/uL (ref 0.2–1.0)
Monocytes Relative: 12 %
Neutro Abs: 2.6 10*3/uL (ref 1.4–6.5)
Neutrophils Relative %: 71 %
Platelets: 265 10*3/uL (ref 150–440)
RBC: 4.15 MIL/uL — ABNORMAL LOW (ref 4.40–5.90)
RDW: 24.2 % — ABNORMAL HIGH (ref 11.5–14.5)
WBC: 3.6 10*3/uL — ABNORMAL LOW (ref 3.8–10.6)

## 2015-03-17 LAB — COMPREHENSIVE METABOLIC PANEL
ALT: 25 U/L (ref 17–63)
AST: 30 U/L (ref 15–41)
Albumin: 3.5 g/dL (ref 3.5–5.0)
Alkaline Phosphatase: 87 U/L (ref 38–126)
Anion gap: 10 (ref 5–15)
BUN: 21 mg/dL — ABNORMAL HIGH (ref 6–20)
CO2: 22 mmol/L (ref 22–32)
Calcium: 9.2 mg/dL (ref 8.9–10.3)
Chloride: 103 mmol/L (ref 101–111)
Creatinine, Ser: 1.3 mg/dL — ABNORMAL HIGH (ref 0.61–1.24)
GFR calc Af Amer: >60 mL/min (ref >60)
GFR calc non Af Amer: 58 mL/min — ABNORMAL LOW (ref >60)
Glucose, Bld: 164 mg/dL — ABNORMAL HIGH (ref 65–99)
Potassium: 3.7 mmol/L (ref 3.5–5.1)
Sodium: 135 mmol/L (ref 135–145)
Total Bilirubin: 0.4 mg/dL (ref 0.3–1.2)
Total Protein: 6.7 g/dL (ref 6.5–8.1)

## 2015-03-17 LAB — MAGNESIUM: Magnesium: 1.6 mg/dL — ABNORMAL LOW (ref 1.7–2.4)

## 2015-03-17 MED ORDER — SODIUM CHLORIDE 0.9 % IV SOLN
1.0000 g | Freq: Once | INTRAVENOUS | Status: AC
  Administered 2015-03-17: 1 g via INTRAVENOUS
  Filled 2015-03-17: qty 2

## 2015-03-17 MED ORDER — FLUOROURACIL CHEMO INJECTION 2.5 GM/50ML
400.0000 mg/m2 | Freq: Once | INTRAVENOUS | Status: AC
  Administered 2015-03-17: 800 mg via INTRAVENOUS
  Filled 2015-03-17: qty 16

## 2015-03-17 MED ORDER — SODIUM CHLORIDE 0.9 % IJ SOLN
10.0000 mL | INTRAMUSCULAR | Status: DC | PRN
  Administered 2015-03-17: 10 mL
  Filled 2015-03-17: qty 10

## 2015-03-17 MED ORDER — DEXTROSE 5 % IV SOLN
Freq: Once | INTRAVENOUS | Status: AC
  Administered 2015-03-17: 10:00:00 via INTRAVENOUS
  Filled 2015-03-17: qty 1000

## 2015-03-17 MED ORDER — FLUOROURACIL CHEMO INJECTION 5 GM/100ML
2400.0000 mg/m2 | INTRAVENOUS | Status: DC
  Administered 2015-03-17: 4950 mg via INTRAVENOUS
  Filled 2015-03-17: qty 99

## 2015-03-17 MED ORDER — LEUCOVORIN CALCIUM INJECTION 350 MG
850.0000 mg | Freq: Once | INTRAVENOUS | Status: AC
  Administered 2015-03-17: 850 mg via INTRAVENOUS
  Filled 2015-03-17: qty 17.5

## 2015-03-17 MED ORDER — OXALIPLATIN CHEMO INJECTION 100 MG/20ML
85.0000 mg/m2 | Freq: Once | INTRAVENOUS | Status: AC
  Administered 2015-03-17: 175 mg via INTRAVENOUS
  Filled 2015-03-17: qty 35

## 2015-03-17 MED ORDER — SODIUM CHLORIDE 0.9 % IV SOLN
Freq: Once | INTRAVENOUS | Status: AC
  Administered 2015-03-17: 10:00:00 via INTRAVENOUS
  Filled 2015-03-17: qty 4

## 2015-03-17 NOTE — Progress Notes (Signed)
[name] day:  03/10/2015   Chief Complaint: [name] SCHNOOR [name] is a 59 y.o. male with clinical stage IIA rectal cancer s/p neoadjuvant chemotherapy and radiation followed by low anterior resection who is seen for assessment prior to cycle #6 FOLFOX chemotherapy.  HPI:  The patient was last seen in the medical oncology clinic on 02/24/2015.  At that time, his feet were bothering him a little. He noted that his stomach was sore and queasy. He had some  recent issues with gout. He noted a cold neuropathy 2 days after chemotherapy, but that went away after a week.  He received his chemotherapy uneventfully.  During the interim, he notes he has had trouble with gout and his legs.  He is taking colchicine for his gout. Cochicine causes diarrhea.  He struggles with peripheral vascular disease in his legs. He has an appointment with vascular surgery this week.  He denies any neuropathy in his fingers. He notes a cold sensitivity post chemotherapy. He has a little bit of queasiness.  Two  days ago he had some slight rectal discharge.  Past Medical History  Diagnosis Date  . Rectal cancer ([place])   . Cancer ([place])   . Iron deficiency anemia   . Pulmonary nodules   . Hypertension   . Hyperlipidemia   . Hematuria   . Colon cancer ([place])   . Myocardial infarction ([place])   . Ureter, stricture   . Gout     Past Surgical History  Procedure Laterality Date  . Colonoscopy 06/06/14    . Colonoscopy with esophagogastroduodenoscopy (egd)    . Portacath placement  07/01/2014    Dr. Marina Gravel  . Coronary angioplasty with stent placement  03/2004  . Bowel resection N/A 10/23/2014    Procedure: LOW ANTERIOR BOWEL RESECTION;  Surgeon: Sherri Rad, MD;  Location: [facility] ORS;  Service: General;  Laterality: N/A;  . Laparotomy N/A 10/23/2014    Procedure: EXPLORATORY LAPAROTOMY;  Surgeon: Sherri Rad, MD;  Location: [facility];  Service: General;  Laterality: N/A;  . Diverting  ileostomy N/A 10/23/2014    Procedure: DIVERTING ILEOSTOMY;  Surgeon: Sherri Rad, MD;  Location: [facility] OR[facility];  Service: General;  Laterality: N/A;  . Cystoscopy with stent placement Bilateral 10/23/2014    Procedure: CYSTOSCOPY WITH STENT PLACEMENT,URETHRAL DILATION, LEFT RETROGRADE PYELOGRAM, URETEROSCOPY;  Surgeon: Hollice Espy, MD;  Location: [facility];  Service: Urology;  Laterality: Bilateral;    Family History  Problem Relation Age of Onset  . Hypertension Brother   . Hypertension Sister   . Hypertension Father   . Heart disease Paternal Grandfather   . Diabetes Father   . Diabetes Brother     Social History:  reports that he quit smoking about 30 years ago. He has never used smokeless tobacco. He reports that he does not drink alcohol or use illicit drugs.  The patient is accompanied by his wife, [name] [name], today.  Allergies:  Allergies  Allergen Reactions  . No Known Allergies     Current Medications: Current Outpatient Prescriptions  Medication Sig Dispense Refill  . Ascorbic Acid (VITAMIN C) 1000 MG tablet Take 1,000 mg by mouth daily.    Marland Kitchen aspirin EC 81 MG tablet Take by mouth.    . cloNIDine (CATAPRES) 0.2 MG tablet Take 0.2 mg by mouth 2 (two) times daily.    . colchicine 0.6 MG tablet Take 0.6 mg by mouth daily.    . vitamin E 400 UNIT capsule  Take 400 Units by mouth 2 (two) times daily.     No current facility-administered medications for this visit.   Facility-Administered Medications Ordered in Other Visits  Medication Dose Route Frequency Provider Last Rate Last Dose  . sodium chloride 0.9 % injection 10 mL  10 mL Intravenous PRN Dallas Schimke, MD   10 mL at 08/26/14 1300    Review of Systems:  GENERAL:  Feels good.   No fevers, sweats.  Weight down 3 pounds. PERFORMANCE STATUS (ECOG):  1 HEENT:  No visual changes, runny nose, sore throat, mouth sores or tenderness. Lungs: No shortness of breath or cough.  No hemoptysis. Cardiac:  No chest pain, palpitations,  orthopnea, or PND. GI:  Ostomy working well.  Little diarrhea secondary to colchicine.  Little queaziness.  No vomiting, constipation, melena or hematochezia. GU:  No urgency, frequency, dysuria, and intermittent hematuria. Musculoskeletal:  No back pain.  No joint pain.  No muscle tenderness. Extremities:  Trouble with legs secondary to peripheral vascular disease.  No pain or swelling. Skin:  Gout.  No rashes or skin changes. Neuro:  Transient cold neuropathy.  No headache, numbness or weakness, balance or coordination issues. Endocrine:  No diabetes, thyroid issues, hot flashes or night sweats. Psych:  No mood changes, depression or anxiety. Review of systems:  All other systems reviewed and found to be negative.   Physical Exam: Blood pressure 158/89, pulse 74, temperature 96.4 F (35.8 C), temperature source Tympanic, resp. rate 18, height '5\' 10"'  (1.778 m), weight 202 lb 13.2 oz (92 kg).  GENERAL:  Well developed, well nourished, sitting comfortably in the exam room in no acute distress.  He has a cane at his side. MENTAL STATUS:  Alert and oriented to person, place and time. HEAD:  Wearing a cap.  Brown hair.  Mustache.  Normocephalic, atraumatic, face symmetric, no Cushingoid features. EYES:  Brown eyes.  Pupils equal round and reactive to light and accomodation.  No conjunctivitis or scleral icterus. ENT:  Oropharynx clear without lesion.  Tongue normal. Mucous membranes moist.  RESPIRATORY:  Clear to auscultation without rales, wheezes or rhonchi. CARDIOVASCULAR:  Regular rate and rhythm without murmur, rub or gallop. ABDOMEN:  Ileostomy.  Brown stool in bag.  Soft, non-tender with active bowel sounds, and no hepatosplenomegaly.  No masses. SKIN:  No rashes, ulcers or lesions. EXTREMITIES: Nail beds dark post chemo.  No edema, no skin discoloration or tenderness.  No palpable cords. LYMPH NODES: No palpable cervical, supraclavicular, axillary or inguinal adenopathy  NEUROLOGICAL:  Unremarkable. Bilateral patellar reflexes 0. PSYCH:  Appropriate.   Infusion on 03/10/2015  Component Date Value Ref Range Status  . WBC 03/10/2015 4.0  3.8 - 10.6 K/uL Final  . RBC 03/10/2015 4.12* 4.40 - 5.90 MIL/uL Final  . Hemoglobin 03/10/2015 9.4* 13.0 - 18.0 g/dL Final  . HCT 03/10/2015 30.9* 40.0 - 52.0 % Final  . MCV 03/10/2015 74.9* 80.0 - 100.0 fL Final  . MCH 03/10/2015 22.8* 26.0 - 34.0 pg Final  . MCHC 03/10/2015 30.4* 32.0 - 36.0 g/dL Final  . RDW 03/10/2015 22.4* 11.5 - 14.5 % Final  . Platelets 03/10/2015 141* 150 - 440 K/uL Final  . Neutrophils Relative % 03/10/2015 75   Final  . Neutro Abs 03/10/2015 3.0  1.4 - 6.5 K/uL Final  . Lymphocytes Relative 03/10/2015 9   Final  . Lymphs Abs 03/10/2015 0.4* 1.0 - 3.6 K/uL Final  . Monocytes Relative 03/10/2015 12   Final  . Monocytes  Absolute 03/10/2015 0.5  0.2 - 1.0 K/uL Final  . Eosinophils Relative 03/10/2015 3   Final  . Eosinophils Absolute 03/10/2015 0.1  0 - 0.7 K/uL Final  . Basophils Relative 03/10/2015 1   Final  . Basophils Absolute 03/10/2015 0.0  0 - 0.1 K/uL Final  . Sodium 03/10/2015 139  135 - 145 mmol/L Final  . Potassium 03/10/2015 3.6  3.5 - 5.1 mmol/L Final  . Chloride 03/10/2015 106  101 - 111 mmol/L Final  . CO2 03/10/2015 24  22 - 32 mmol/L Final  . Glucose, Bld 03/10/2015 104* 65 - 99 mg/dL Final  . BUN 03/10/2015 21* 6 - 20 mg/dL Final  . Creatinine, Ser 03/10/2015 1.21  0.61 - 1.24 mg/dL Final  . Calcium 03/10/2015 9.5  8.9 - 10.3 mg/dL Final  . Total Protein 03/10/2015 7.2  6.5 - 8.1 g/dL Final  . Albumin 03/10/2015 3.7  3.5 - 5.0 g/dL Final  . AST 03/10/2015 37  15 - 41 U/L Final  . ALT 03/10/2015 30  17 - 63 U/L Final  . Alkaline Phosphatase 03/10/2015 80  38 - 126 U/L Final  . Total Bilirubin 03/10/2015 0.4  0.3 - 1.2 mg/dL Final  . GFR calc non Af Amer 03/10/2015 >60  >60 mL/min Final  . GFR calc Af Amer 03/10/2015 >60  >60 mL/min Final   Comment: (NOTE) The eGFR has been calculated  using the CKD EPI equation. This calculation has not been validated in all clinical situations. eGFR's persistently <60 mL/min signify possible Chronic Kidney Disease.   . Anion gap 03/10/2015 9  5 - 15 Final  . Magnesium 03/10/2015 1.8  1.7 - 2.4 mg/dL Final    Assessment:  [name] KULPA [name] is a 59 y.o. male with clinical stage IIA (T3N0) rectal cancer.  Colonoscopy on 06/06/2014 revealed a large non-circumferential distal rectal mass. Biopsy was positive for adenocarcinoma.   CT scans revealed a 3 mm pulmonary nodule (indeterminate). There was a large rectal mass suspicious for transmural extension into the mesorectum.There was equivocal perirectal and sigmoid mesocolon nodes. There was no liver metastasis or extrapelvic disease.  Endoscopic ultrasound at [facility] revealed a 70% circumferential mass at 9-14 cm from the anal verge and measuring 9 mm in maximal thickness. Ultrasound suggested breakthrough of the muscularis propria with invasion into the perirectal fat.  He received neoadjuvant chemotherapy (continuous infusion 5FU) and radiation. He received radiation from 07/03/2014 until 08/16/2014. He underwent low anterior resection with diverting loop ileostomy on 10/23/2014.  Pathology revealed a complete response.  Zero of 10 lymph nodes were positive.  He has iron deficiency anemia.  Ferritin was 34 on 10/04/2014.  He is on oral iron.  He has significant peripheral vascular disease. A stent or bypass is plannned.   He is currently status post 5 cycles of  FOLFOX chemotherapy (12/27/2014 - 02/24/2015).  He is tolerating his chemotherapy well.  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  He notes a little nausea.  He has diarrhea associated with colchicine.  His central line is not working.  Plan: 1.  Labs today:  CBC with diff, CMP, Mg. 2.  CXR for line placement. 3.  If line in good position, cathflo. 4.  RTC on 11/28 for labs and cycle #6 of 8  FOLFOX. 3.  RTC on 11/30 for pump disconnect. 4.  RTC on 12/12 for MD assessment, labs (CBC with diff, CMP, Mg), and cycle #7 FOLFOX chemotherapy.   [name]  Elmarie Mainland, MD  03/10/2015

## 2015-03-19 ENCOUNTER — Inpatient Hospital Stay: Payer: 59

## 2015-03-19 DIAGNOSIS — C20 Malignant neoplasm of rectum: Secondary | ICD-10-CM

## 2015-03-19 DIAGNOSIS — Z5111 Encounter for antineoplastic chemotherapy: Secondary | ICD-10-CM | POA: Diagnosis not present

## 2015-03-19 MED ORDER — HEPARIN SOD (PORK) LOCK FLUSH 100 UNIT/ML IV SOLN
INTRAVENOUS | Status: AC
  Filled 2015-03-19: qty 5

## 2015-03-19 MED ORDER — SODIUM CHLORIDE 0.9 % IJ SOLN
10.0000 mL | INTRAMUSCULAR | Status: DC | PRN
  Administered 2015-03-19: 10 mL
  Filled 2015-03-19: qty 10

## 2015-03-19 MED ORDER — HEPARIN SOD (PORK) LOCK FLUSH 100 UNIT/ML IV SOLN
500.0000 [IU] | Freq: Once | INTRAVENOUS | Status: AC | PRN
  Administered 2015-03-19: 500 [IU]

## 2015-03-28 ENCOUNTER — Ambulatory Visit: Payer: Managed Care, Other (non HMO)

## 2015-03-29 ENCOUNTER — Other Ambulatory Visit: Payer: Self-pay | Admitting: Hematology and Oncology

## 2015-03-30 ENCOUNTER — Encounter: Payer: Self-pay | Admitting: Hematology and Oncology

## 2015-03-31 ENCOUNTER — Inpatient Hospital Stay (HOSPITAL_BASED_OUTPATIENT_CLINIC_OR_DEPARTMENT_OTHER): Payer: 59 | Admitting: Hematology and Oncology

## 2015-03-31 ENCOUNTER — Inpatient Hospital Stay: Payer: 59 | Attending: Hematology and Oncology

## 2015-03-31 ENCOUNTER — Inpatient Hospital Stay: Payer: 59

## 2015-03-31 ENCOUNTER — Other Ambulatory Visit: Payer: Self-pay

## 2015-03-31 ENCOUNTER — Encounter: Payer: Self-pay | Admitting: Hematology and Oncology

## 2015-03-31 ENCOUNTER — Other Ambulatory Visit: Payer: Self-pay | Admitting: Hematology and Oncology

## 2015-03-31 VITALS — BP 148/78 | HR 68 | Temp 97.7°F | Resp 18 | Ht 70.0 in | Wt 207.2 lb

## 2015-03-31 DIAGNOSIS — C20 Malignant neoplasm of rectum: Secondary | ICD-10-CM

## 2015-03-31 DIAGNOSIS — M109 Gout, unspecified: Secondary | ICD-10-CM | POA: Insufficient documentation

## 2015-03-31 DIAGNOSIS — D509 Iron deficiency anemia, unspecified: Secondary | ICD-10-CM | POA: Insufficient documentation

## 2015-03-31 DIAGNOSIS — I252 Old myocardial infarction: Secondary | ICD-10-CM | POA: Insufficient documentation

## 2015-03-31 DIAGNOSIS — Z5111 Encounter for antineoplastic chemotherapy: Secondary | ICD-10-CM | POA: Insufficient documentation

## 2015-03-31 DIAGNOSIS — Z923 Personal history of irradiation: Secondary | ICD-10-CM | POA: Insufficient documentation

## 2015-03-31 DIAGNOSIS — Z79899 Other long term (current) drug therapy: Secondary | ICD-10-CM | POA: Insufficient documentation

## 2015-03-31 DIAGNOSIS — I10 Essential (primary) hypertension: Secondary | ICD-10-CM | POA: Diagnosis not present

## 2015-03-31 DIAGNOSIS — Z87891 Personal history of nicotine dependence: Secondary | ICD-10-CM | POA: Diagnosis not present

## 2015-03-31 DIAGNOSIS — E785 Hyperlipidemia, unspecified: Secondary | ICD-10-CM | POA: Diagnosis not present

## 2015-03-31 DIAGNOSIS — T451X5S Adverse effect of antineoplastic and immunosuppressive drugs, sequela: Secondary | ICD-10-CM | POA: Insufficient documentation

## 2015-03-31 DIAGNOSIS — G620 Drug-induced polyneuropathy: Secondary | ICD-10-CM | POA: Diagnosis not present

## 2015-03-31 LAB — COMPREHENSIVE METABOLIC PANEL
ALT: 25 U/L (ref 17–63)
AST: 33 U/L (ref 15–41)
Albumin: 3.8 g/dL (ref 3.5–5.0)
Alkaline Phosphatase: 91 U/L (ref 38–126)
Anion gap: 7 (ref 5–15)
BUN: 16 mg/dL (ref 6–20)
CO2: 22 mmol/L (ref 22–32)
Calcium: 9.1 mg/dL (ref 8.9–10.3)
Chloride: 104 mmol/L (ref 101–111)
Creatinine, Ser: 1.12 mg/dL (ref 0.61–1.24)
GFR calc Af Amer: >60 mL/min (ref >60)
GFR calc non Af Amer: >60 mL/min (ref >60)
Glucose, Bld: 118 mg/dL — ABNORMAL HIGH (ref 65–99)
Potassium: 3.5 mmol/L (ref 3.5–5.1)
Sodium: 133 mmol/L — ABNORMAL LOW (ref 135–145)
Total Bilirubin: 0.3 mg/dL (ref 0.3–1.2)
Total Protein: 6.9 g/dL (ref 6.5–8.1)

## 2015-03-31 LAB — CBC WITH DIFFERENTIAL/PLATELET
Basophils Absolute: 0 10*3/uL (ref 0–0.1)
Basophils Relative: 1 %
Eosinophils Absolute: 0.1 10*3/uL (ref 0–0.7)
Eosinophils Relative: 4 %
HCT: 31.2 % — ABNORMAL LOW (ref 40.0–52.0)
Hemoglobin: 9.8 g/dL — ABNORMAL LOW (ref 13.0–18.0)
Lymphocytes Relative: 11 %
Lymphs Abs: 0.4 10*3/uL — ABNORMAL LOW (ref 1.0–3.6)
MCH: 24.1 pg — ABNORMAL LOW (ref 26.0–34.0)
MCHC: 31.5 g/dL — ABNORMAL LOW (ref 32.0–36.0)
MCV: 76.5 fL — ABNORMAL LOW (ref 80.0–100.0)
Monocytes Absolute: 0.5 10*3/uL (ref 0.2–1.0)
Monocytes Relative: 12 %
Neutro Abs: 2.8 10*3/uL (ref 1.4–6.5)
Neutrophils Relative %: 72 %
Platelets: 146 10*3/uL — ABNORMAL LOW (ref 150–440)
RBC: 4.08 MIL/uL — ABNORMAL LOW (ref 4.40–5.90)
RDW: 24 % — ABNORMAL HIGH (ref 11.5–14.5)
WBC: 3.8 10*3/uL (ref 3.8–10.6)

## 2015-03-31 LAB — MAGNESIUM: Magnesium: 1.6 mg/dL — ABNORMAL LOW (ref 1.7–2.4)

## 2015-03-31 MED ORDER — FLUOROURACIL CHEMO INJECTION 2.5 GM/50ML
400.0000 mg/m2 | Freq: Once | INTRAVENOUS | Status: AC
  Administered 2015-03-31: 800 mg via INTRAVENOUS
  Filled 2015-03-31: qty 16

## 2015-03-31 MED ORDER — MAGNESIUM SULFATE 50 % IJ SOLN
1.0000 g | Freq: Once | INTRAMUSCULAR | Status: AC
  Administered 2015-03-31: 1 g via INTRAVENOUS
  Filled 2015-03-31: qty 2

## 2015-03-31 MED ORDER — SODIUM CHLORIDE 0.9 % IV SOLN
Freq: Once | INTRAVENOUS | Status: AC
  Administered 2015-03-31: 11:00:00 via INTRAVENOUS
  Filled 2015-03-31: qty 4

## 2015-03-31 MED ORDER — DEXTROSE 5 % IV SOLN
Freq: Once | INTRAVENOUS | Status: AC
  Administered 2015-03-31: 11:00:00 via INTRAVENOUS
  Filled 2015-03-31: qty 1000

## 2015-03-31 MED ORDER — FLUOROURACIL CHEMO INJECTION 5 GM/100ML
2400.0000 mg/m2 | INTRAVENOUS | Status: DC
  Administered 2015-03-31: 4950 mg via INTRAVENOUS
  Filled 2015-03-31: qty 99

## 2015-03-31 MED ORDER — SODIUM CHLORIDE 0.9 % IJ SOLN
10.0000 mL | INTRAMUSCULAR | Status: DC | PRN
  Filled 2015-03-31: qty 10

## 2015-03-31 MED ORDER — OXALIPLATIN CHEMO INJECTION 100 MG/20ML
85.0000 mg/m2 | Freq: Once | INTRAVENOUS | Status: AC
  Administered 2015-03-31: 175 mg via INTRAVENOUS
  Filled 2015-03-31: qty 35

## 2015-03-31 MED ORDER — LEUCOVORIN CALCIUM INJECTION 350 MG
850.0000 mg | Freq: Once | INTRAVENOUS | Status: AC
  Administered 2015-03-31: 850 mg via INTRAVENOUS
  Filled 2015-03-31: qty 25

## 2015-03-31 NOTE — Progress Notes (Signed)
Patient is here for follow-up of colon cancer and folfox treatment. Patient is having a lot of pain in his left foot from gout. Patient has been out of his allopurinol since around August. Patient's appetite has been  "pretty good".

## 2015-03-31 NOTE — Progress Notes (Signed)
[name] day:  03/31/2015   Chief Complaint: [name] REHFELD [name] is a 59 y.o. male with clinical stage IIA rectal cancer s/p neoadjuvant chemotherapy and radiation followed by low anterior resection who is seen for assessment prior to cycle #7 of 8 FOLFOX chemotherapy.  HPI:  The patient was last seen in the medical oncology clinic on 03/10/2015.  At that time, cycle #6 FOLFOX was postponed secondary to non-functional central line.  He returned on 03/17/2015 and received his chemotherapy uneventfully.  At last visit, he was still struggling with gout and lower extremity symptoms caused by his peripheral vascular disease.  During the interim, he has felt about the same.  He still has bothersome gout in his left foot.  He ran out of his colchicine.  He has not followed up with his primary care physician.  He only notes cold neuropathy post chemotherapy.  He denies any diarrhea.  Past Medical History  Diagnosis Date  . Rectal cancer ([place])   . Cancer ([place])   . Iron deficiency anemia   . Pulmonary nodules   . Hypertension   . Hyperlipidemia   . Hematuria   . Colon cancer ([place])   . Myocardial infarction ([place])   . Ureter, stricture   . Gout     Past Surgical History  Procedure Laterality Date  . Colonoscopy 06/06/14    . Colonoscopy with esophagogastroduodenoscopy (egd)    . Portacath placement  07/01/2014    Dr. Marina Gravel  . Coronary angioplasty with stent placement  03/2004  . Bowel resection N/A 10/23/2014    Procedure: LOW ANTERIOR BOWEL RESECTION;  Surgeon: Sherri Rad, MD;  Location: [facility] OR[facility];  Service: General;  Laterality: N/A;  . Laparotomy N/A 10/23/2014    Procedure: EXPLORATORY LAPAROTOMY;  Surgeon: Sherri Rad, MD;  Location: [facility] OR[facility];  Service: General;  Laterality: N/A;  . Diverting ileostomy N/A 10/23/2014    Procedure: DIVERTING ILEOSTOMY;  Surgeon: Sherri Rad, MD;  Location: [facility] ORS;  Service: General;  Laterality: N/A;  . Cystoscopy  with stent placement Bilateral 10/23/2014    Procedure: CYSTOSCOPY WITH STENT PLACEMENT,URETHRAL DILATION, LEFT RETROGRADE PYELOGRAM, URETEROSCOPY;  Surgeon: Hollice Espy, MD;  Location: [facility] OR[facility];  Service: Urology;  Laterality: Bilateral;    Family History  Problem Relation Age of Onset  . Hypertension Brother   . Hypertension Sister   . Hypertension Father   . Heart disease Paternal Grandfather   . Diabetes Father   . Diabetes Brother     Social History:  reports that he quit smoking about 30 years ago. He has never used smokeless tobacco. He reports that he does not drink alcohol or use illicit drugs.  The patient is accompanied by his wife, Meredith Mody, today.  Allergies:  Allergies  Allergen Reactions  . No Known Allergies     Current Medications: Current Outpatient Prescriptions  Medication Sig Dispense Refill  . Ascorbic Acid (VITAMIN C) 1000 MG tablet Take 1,000 mg by mouth daily.    Marland Kitchen aspirin EC 81 MG tablet Take by mouth.    . cloNIDine (CATAPRES) 0.2 MG tablet Take 0.2 mg by mouth 2 (two) times daily.    . colchicine 0.6 MG tablet Take 0.6 mg by mouth daily.    . vitamin E 400 UNIT capsule Take 400 Units by mouth 2 (two) times daily.     No current facility-administered medications for this visit.   Facility-Administered Medications Ordered in Other Visits  Medication Dose  Route Frequency Provider Last Rate Last Dose  . dextrose 5 % solution   Intravenous Once Lequita Asal, MD      . fluorouracil (ADRUCIL) 4,950 mg in sodium chloride 0.9 % 51 mL chemo infusion  2,400 mg/m2 (Treatment Plan Actual) Intravenous 1 day or 1 dose Lequita Asal, MD      . fluorouracil (ADRUCIL) chemo injection 800 mg  400 mg/m2 (Treatment Plan Actual) Intravenous Once Lequita Asal, MD      . leucovorin 850 mg in dextrose 5 % 250 mL infusion  850 mg Intravenous Once Lequita Asal, MD      . ondansetron (ZOFRAN) 8 mg, dexamethasone (DECADRON) 10 mg in sodium chloride 0.9 % 50  mL IVPB   Intravenous Once Lequita Asal, MD      . oxaliplatin (ELOXATIN) 175 mg in dextrose 5 % 500 mL chemo infusion  85 mg/m2 (Treatment Plan Actual) Intravenous Once Lequita Asal, MD      . sodium chloride 0.9 % injection 10 mL  10 mL Intravenous PRN Dallas Schimke, MD   10 mL at 08/26/14 1300  . sodium chloride 0.9 % injection 10 mL  10 mL Intracatheter PRN Lequita Asal, MD   10 mL at 03/19/15 1121  . sodium chloride 0.9 % injection 10 mL  10 mL Intracatheter PRN Lequita Asal, MD        Review of Systems:  GENERAL:  Feels the same.   No fevers, sweats.  Weight up 5 pounds. PERFORMANCE STATUS (ECOG):  1 HEENT:  No visual changes, runny nose, sore throat, mouth sores or tenderness. Lungs: No shortness of breath or cough.  No hemoptysis. Cardiac:  No chest pain, palpitations, orthopnea, or PND. GI:  Ostomy working well.  No nausea, vomiting, diarrhea, constipation, melena or hematochezia. GU:  No urgency, frequency, dysuria, and intermittent hematuria. Musculoskeletal:  No back pain.  No joint pain.  No muscle tenderness. Extremities:  Trouble with legs secondary to peripheral vascular disease.  Left foot swelling and gout.   Skin:  Gout.  No rashes or skin changes. Neuro:  Transient cold neuropathy.  No headache, numbness or weakness, balance or coordination issues. Endocrine:  No diabetes, thyroid issues, hot flashes or night sweats. Psych:  No mood changes, depression or anxiety. Review of systems:  All other systems reviewed and found to be negative.   Physical Exam: Blood pressure 148/78, pulse 68, temperature 97.7 F (36.5 C), temperature source Tympanic, resp. rate 18, height $RemoveBe'5\' 10"'[id number]$  (1.778 m), weight 207 lb 3.7 oz (94 kg).  GENERAL:  Well developed, well nourished, sitting comfortably in a wheelchair the exam room in no acute distress. MENTAL STATUS:  Alert and oriented to person, place and time. HEAD:  Wearing a black cap.  Brown hair.  Slight mustache.   Normocephalic, atraumatic, face symmetric, no Cushingoid features. EYES:  Brown eyes.  Pupils equal round and reactive to light and accomodation.  No conjunctivitis or scleral icterus. ENT:  Oropharynx clear without lesion.  Tongue normal. Mucous membranes moist.  RESPIRATORY:  Clear to auscultation without rales, wheezes or rhonchi. CARDIOVASCULAR:  Regular rate and rhythm without murmur, rub or gallop. ABDOMEN:  Ileostomy.  Brown stool in bag.  Soft, non-tender with active bowel sounds, and no hepatosplenomegaly.  No masses. SKIN:  No rashes, ulcers or lesions. EXTREMITIES: Nail beds dark post chemo.  Shoes removed.  Mild left foot edema with pain across top of foot associated with prior episodes  of gout.  No tophi.  No skin discoloration.  No palpable cords. LYMPH NODES: No palpable cervical, supraclavicular, axillary or inguinal adenopathy  NEUROLOGICAL: Unremarkable. PSYCH:  Appropriate.   Infusion on 03/31/2015  Component Date Value Ref Range Status  . Sodium 03/31/2015 133* 135 - 145 mmol/L Final  . Potassium 03/31/2015 3.5  3.5 - 5.1 mmol/L Final  . Chloride 03/31/2015 104  101 - 111 mmol/L Final  . CO2 03/31/2015 22  22 - 32 mmol/L Final  . Glucose, Bld 03/31/2015 118* 65 - 99 mg/dL Final  . BUN 03/31/2015 16  6 - 20 mg/dL Final  . Creatinine, Ser 03/31/2015 1.12  0.61 - 1.24 mg/dL Final  . Calcium 03/31/2015 9.1  8.9 - 10.3 mg/dL Final  . Total Protein 03/31/2015 6.9  6.5 - 8.1 g/dL Final  . Albumin 03/31/2015 3.8  3.5 - 5.0 g/dL Final  . AST 03/31/2015 33  15 - 41 U/L Final  . ALT 03/31/2015 25  17 - 63 U/L Final  . Alkaline Phosphatase 03/31/2015 91  38 - 126 U/L Final  . Total Bilirubin 03/31/2015 0.3  0.3 - 1.2 mg/dL Final  . GFR calc non Af Amer 03/31/2015 >60  >60 mL/min Final  . GFR calc Af Amer 03/31/2015 >60  >60 mL/min Final   Comment: (NOTE) The eGFR has been calculated using the CKD EPI equation. This calculation has not been validated in all clinical  situations. eGFR's persistently <60 mL/min signify possible Chronic Kidney Disease.   . Anion gap 03/31/2015 7  5 - 15 Final  . WBC 03/31/2015 3.8  3.8 - 10.6 K/uL Final  . RBC 03/31/2015 4.08* 4.40 - 5.90 MIL/uL Final  . Hemoglobin 03/31/2015 9.8* 13.0 - 18.0 g/dL Final  . HCT 03/31/2015 31.2* 40.0 - 52.0 % Final  . MCV 03/31/2015 76.5* 80.0 - 100.0 fL Final  . MCH 03/31/2015 24.1* 26.0 - 34.0 pg Final  . MCHC 03/31/2015 31.5* 32.0 - 36.0 g/dL Final  . RDW 03/31/2015 24.0* 11.5 - 14.5 % Final  . Platelets 03/31/2015 146* 150 - 440 K/uL Final  . Neutrophils Relative % 03/31/2015 72   Final  . Neutro Abs 03/31/2015 2.8  1.4 - 6.5 K/uL Final  . Lymphocytes Relative 03/31/2015 11   Final  . Lymphs Abs 03/31/2015 0.4* 1.0 - 3.6 K/uL Final  . Monocytes Relative 03/31/2015 12   Final  . Monocytes Absolute 03/31/2015 0.5  0.2 - 1.0 K/uL Final  . Eosinophils Relative 03/31/2015 4   Final  . Eosinophils Absolute 03/31/2015 0.1  0 - 0.7 K/uL Final  . Basophils Relative 03/31/2015 1   Final  . Basophils Absolute 03/31/2015 0.0  0 - 0.1 K/uL Final  . Magnesium 03/31/2015 1.6* 1.7 - 2.4 mg/dL Final    Assessment:  SHADRACH BARTUNEK [name] is a 59 y.o. male with clinical stage IIA (T3N0) rectal cancer.  Colonoscopy on 06/06/2014 revealed a large non-circumferential distal rectal mass. Biopsy was positive for adenocarcinoma.   CT scans revealed a 3 mm pulmonary nodule (indeterminate). There was a large rectal mass suspicious for transmural extension into the mesorectum.There was equivocal perirectal and sigmoid mesocolon nodes. There was no liver metastasis or extrapelvic disease.  [facility] at [facility] revealed a 70% circumferential mass at 9-14 cm from the anal verge and measuring 9 mm in maximal thickness. Ultrasound suggested breakthrough of the muscularis propria with invasion into the perirectal fat.  He received neoadjuvant chemotherapy (continuous infusion 5FU) and  radiation.  He received radiation from 07/03/2014 until 08/16/2014. He underwent low anterior resection with diverting loop ileostomy on 10/23/2014.  Pathology revealed a complete response.  Zero of 10 lymph nodes were positive.  He has iron deficiency anemia.  Ferritin was 34 on 10/04/2014.  He is on oral iron.  He has significant peripheral vascular disease. A stent or bypass is plannned.  He has had a gout flare for the past few weeks in his left foot.  He ran out of his colchicine.  He is currently status post 6 cycles of  FOLFOX chemotherapy (12/27/2014 - 03/17/2015).  He is tolerating his chemotherapy well.  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    Plan: 1.  Labs today:  CBC with diff, CMP, Mg. 2.  Cycle #7 FOLFOX chemotherapy today. 4.  RTC in 2 days for pump disconnect. 5.  Encourage follow-up with PCP regarding management of gout. 6.  Coordinate care with vascular surgery, general surgery, and possible cardiothoracic surgery regarding upcoming surgeries following completion of chemotherapy. 7.  RTC in 2 weeks for MD assessment, labs (CBC with diff, CMP, Mg), and cycle #8 FOLFOX chemotherapy.   Lequita Asal, MD  03/31/2015, 10:29 AM

## 2015-04-02 ENCOUNTER — Inpatient Hospital Stay: Payer: 59

## 2015-04-02 DIAGNOSIS — Z5111 Encounter for antineoplastic chemotherapy: Secondary | ICD-10-CM | POA: Diagnosis not present

## 2015-04-02 DIAGNOSIS — C801 Malignant (primary) neoplasm, unspecified: Secondary | ICD-10-CM

## 2015-04-02 MED ORDER — SODIUM CHLORIDE 0.9 % IJ SOLN
10.0000 mL | INTRAMUSCULAR | Status: DC | PRN
  Administered 2015-04-02: 10 mL via INTRAVENOUS
  Filled 2015-04-02: qty 10

## 2015-04-02 MED ORDER — HEPARIN SOD (PORK) LOCK FLUSH 100 UNIT/ML IV SOLN
500.0000 [IU] | Freq: Once | INTRAVENOUS | Status: AC
  Administered 2015-04-02: 500 [IU] via INTRAVENOUS

## 2015-04-02 MED ORDER — HEPARIN SOD (PORK) LOCK FLUSH 100 UNIT/ML IV SOLN
INTRAVENOUS | Status: AC
  Filled 2015-04-02: qty 5

## 2015-04-10 ENCOUNTER — Ambulatory Visit: Payer: Managed Care, Other (non HMO)

## 2015-04-14 ENCOUNTER — Other Ambulatory Visit: Payer: Self-pay | Admitting: Hematology and Oncology

## 2015-04-15 ENCOUNTER — Inpatient Hospital Stay: Payer: 59

## 2015-04-15 ENCOUNTER — Inpatient Hospital Stay (HOSPITAL_BASED_OUTPATIENT_CLINIC_OR_DEPARTMENT_OTHER): Payer: 59 | Admitting: Hematology and Oncology

## 2015-04-15 VITALS — BP 169/92 | HR 64 | Temp 97.6°F | Resp 18 | Ht 70.0 in | Wt 209.4 lb

## 2015-04-15 DIAGNOSIS — D509 Iron deficiency anemia, unspecified: Secondary | ICD-10-CM

## 2015-04-15 DIAGNOSIS — G620 Drug-induced polyneuropathy: Secondary | ICD-10-CM | POA: Diagnosis not present

## 2015-04-15 DIAGNOSIS — C20 Malignant neoplasm of rectum: Secondary | ICD-10-CM | POA: Diagnosis not present

## 2015-04-15 DIAGNOSIS — T451X5S Adverse effect of antineoplastic and immunosuppressive drugs, sequela: Secondary | ICD-10-CM

## 2015-04-15 DIAGNOSIS — M109 Gout, unspecified: Secondary | ICD-10-CM | POA: Diagnosis not present

## 2015-04-15 DIAGNOSIS — Z923 Personal history of irradiation: Secondary | ICD-10-CM

## 2015-04-15 DIAGNOSIS — Z5111 Encounter for antineoplastic chemotherapy: Secondary | ICD-10-CM | POA: Diagnosis not present

## 2015-04-15 DIAGNOSIS — I70219 Atherosclerosis of native arteries of extremities with intermittent claudication, unspecified extremity: Secondary | ICD-10-CM

## 2015-04-15 DIAGNOSIS — Z79899 Other long term (current) drug therapy: Secondary | ICD-10-CM

## 2015-04-15 LAB — MAGNESIUM: Magnesium: 1.6 mg/dL — ABNORMAL LOW (ref 1.7–2.4)

## 2015-04-15 LAB — COMPREHENSIVE METABOLIC PANEL
ALT: 41 U/L (ref 17–63)
AST: 45 U/L — ABNORMAL HIGH (ref 15–41)
Albumin: 3.9 g/dL (ref 3.5–5.0)
Alkaline Phosphatase: 87 U/L (ref 38–126)
Anion gap: 7 (ref 5–15)
BUN: 17 mg/dL (ref 6–20)
CO2: 24 mmol/L (ref 22–32)
Calcium: 9.2 mg/dL (ref 8.9–10.3)
Chloride: 105 mmol/L (ref 101–111)
Creatinine, Ser: 1.2 mg/dL (ref 0.61–1.24)
GFR calc Af Amer: >60 mL/min (ref >60)
GFR calc non Af Amer: >60 mL/min (ref >60)
Glucose, Bld: 98 mg/dL (ref 65–99)
Potassium: 3.8 mmol/L (ref 3.5–5.1)
Sodium: 136 mmol/L (ref 135–145)
Total Bilirubin: 0.5 mg/dL (ref 0.3–1.2)
Total Protein: 7 g/dL (ref 6.5–8.1)

## 2015-04-15 LAB — CBC WITH DIFFERENTIAL/PLATELET
Basophils Absolute: 0 10*3/uL (ref 0–0.1)
Basophils Relative: 1 %
Eosinophils Absolute: 0.1 10*3/uL (ref 0–0.7)
Eosinophils Relative: 2 %
HCT: 32.1 % — ABNORMAL LOW (ref 40.0–52.0)
Hemoglobin: 9.9 g/dL — ABNORMAL LOW (ref 13.0–18.0)
Lymphocytes Relative: 9 %
Lymphs Abs: 0.3 10*3/uL — ABNORMAL LOW (ref 1.0–3.6)
MCH: 24.1 pg — ABNORMAL LOW (ref 26.0–34.0)
MCHC: 30.9 g/dL — ABNORMAL LOW (ref 32.0–36.0)
MCV: 78.2 fL — ABNORMAL LOW (ref 80.0–100.0)
Monocytes Absolute: 0.5 10*3/uL (ref 0.2–1.0)
Monocytes Relative: 13 %
Neutro Abs: 2.8 10*3/uL (ref 1.4–6.5)
Neutrophils Relative %: 75 %
Platelets: 143 10*3/uL — ABNORMAL LOW (ref 150–440)
RBC: 4.11 MIL/uL — ABNORMAL LOW (ref 4.40–5.90)
RDW: 22.5 % — ABNORMAL HIGH (ref 11.5–14.5)
WBC: 3.8 10*3/uL (ref 3.8–10.6)

## 2015-04-15 MED ORDER — OXALIPLATIN CHEMO INJECTION 100 MG/20ML
85.0000 mg/m2 | Freq: Once | INTRAVENOUS | Status: AC
  Administered 2015-04-15: 175 mg via INTRAVENOUS
  Filled 2015-04-15: qty 35

## 2015-04-15 MED ORDER — LEUCOVORIN CALCIUM INJECTION 100 MG
20.0000 mg/m2 | Freq: Once | INTRAMUSCULAR | Status: AC
  Administered 2015-04-15: 44 mg via INTRAVENOUS
  Filled 2015-04-15: qty 2.2

## 2015-04-15 MED ORDER — FLUOROURACIL CHEMO INJECTION 5 GM/100ML
2400.0000 mg/m2 | INTRAVENOUS | Status: DC
  Administered 2015-04-15: 4950 mg via INTRAVENOUS
  Filled 2015-04-15: qty 99

## 2015-04-15 MED ORDER — FLUOROURACIL CHEMO INJECTION 2.5 GM/50ML
400.0000 mg/m2 | Freq: Once | INTRAVENOUS | Status: AC
  Administered 2015-04-15: 800 mg via INTRAVENOUS
  Filled 2015-04-15: qty 16

## 2015-04-15 MED ORDER — DEXTROSE 5 % IV SOLN
Freq: Once | INTRAVENOUS | Status: AC
  Administered 2015-04-15: 12:00:00 via INTRAVENOUS
  Filled 2015-04-15: qty 1000

## 2015-04-15 MED ORDER — SODIUM CHLORIDE 0.9 % IV SOLN
Freq: Once | INTRAVENOUS | Status: AC
  Administered 2015-04-15: 13:00:00 via INTRAVENOUS
  Filled 2015-04-15: qty 4

## 2015-04-15 MED ORDER — SODIUM CHLORIDE 0.9 % IV SOLN
1.0000 g | Freq: Once | INTRAVENOUS | Status: AC
  Administered 2015-04-15: 1 g via INTRAVENOUS
  Filled 2015-04-15: qty 2

## 2015-04-15 MED ORDER — LEUCOVORIN CALCIUM INJECTION 350 MG: 850.0000 mg | Freq: Once | INTRAVENOUS | Status: DC

## 2015-04-15 NOTE — Progress Notes (Signed)
[name] [name] [name] day:  04/15/2015   Chief Complaint: LAMAJ [name] [name] is a 59 y.o. male with clinical stage IIA rectal cancer s/p neoadjuvant chemotherapy and radiation followed by low anterior resection who is seen for assessment prior to cycle #8 of 8 FOLFOX chemotherapy.  HPI:  The patient was last seen in the medical oncology [name] [name] on 03/31/2015.  At that time, he received cycle #7 FOLFOX chemotherapy.  He tolerated his chemotherapy well.  He denied any neuropathy or increased diarrhea.  Symptomatically, he notes that his gout is a little better/  He sees his primary care physician tomorrow.  He continues to deny any neuropathy.  Bowel movements are "ok".  He is looking forward to reversal of his colostomy and management of his peripheral vascular disease.  Past Medical History  Diagnosis Date  . Rectal cancer ([place])   . Cancer ([place])   . Iron deficiency anemia   . Pulmonary nodules   . Hypertension   . Hyperlipidemia   . Hematuria   . Colon cancer ([place])   . Myocardial infarction ([place])   . Ureter, stricture   . Gout     Past Surgical History  Procedure Laterality Date  . Colonoscopy 06/06/14    . Colonoscopy with esophagogastroduodenoscopy (egd)    . Portacath placement  07/01/2014    Dr. Marina Gravel  . Coronary angioplasty with stent placement  03/2004  . Bowel resection N/A 10/23/2014    Procedure: LOW ANTERIOR BOWEL RESECTION;  Surgeon: Sherri Rad, MD;  Location: [facility];  Service: General;  Laterality: N/A;  . Laparotomy N/A 10/23/2014    Procedure: EXPLORATORY LAPAROTOMY;  Surgeon: Sherri Rad, MD;  Location: [facility];  Service: General;  Laterality: N/A;  . Diverting ileostomy N/A 10/23/2014    Procedure: DIVERTING ILEOSTOMY;  Surgeon: Sherri Rad, MD;  Location: [facility];  Service: General;  Laterality: N/A;  . Cystoscopy with stent placement Bilateral 10/23/2014    Procedure: CYSTOSCOPY WITH STENT PLACEMENT,URETHRAL DILATION, LEFT RETROGRADE  PYELOGRAM, URETEROSCOPY;  Surgeon: Hollice Espy, MD;  Location: [facility];  Service: Urology;  Laterality: Bilateral;    Family History  Problem Relation Age of Onset  . Hypertension Brother   . Hypertension Sister   . Hypertension Father   . Heart disease Paternal Grandfather   . Diabetes Father   . Diabetes Brother     Social History:  reports that he quit smoking about 30 years ago. He has never used smokeless tobacco. He reports that he does not drink alcohol or use illicit drugs.  The patient is alone today.  Allergies:  Allergies  Allergen Reactions  . No Known Allergies     Current Medications: Current Outpatient Prescriptions  Medication Sig Dispense Refill  . Ascorbic Acid (VITAMIN C) 1000 MG tablet Take 1,000 mg by mouth daily.    Marland Kitchen aspirin EC 81 MG tablet Take by mouth.    . cloNIDine (CATAPRES) 0.2 MG tablet Take 0.2 mg by mouth 2 (two) times daily.    . colchicine 0.6 MG tablet Take 0.6 mg by mouth daily.    . vitamin E 400 UNIT capsule Take 400 Units by mouth 2 (two) times daily.     No current facility-administered medications for this visit.   Facility-Administered Medications Ordered in Other Visits  Medication Dose Route Frequency Provider Last Rate Last Dose  . sodium chloride 0.9 % injection 10 mL  10 mL Intravenous PRN Dallas Schimke, MD  10 mL at 08/26/14 1300  . sodium chloride 0.9 % injection 10 mL  10 mL Intracatheter PRN Lequita Asal, MD   10 mL at 03/19/15 1121    Review of Systems:  GENERAL:  Feels "about the same".   No fevers, sweats.  Weight up 2 pounds. PERFORMANCE STATUS (ECOG):  1 HEENT:  No visual changes, runny nose, sore throat, mouth sores or tenderness. Lungs: No shortness of breath or cough.  No hemoptysis. Cardiac:  No chest pain, palpitations, orthopnea, or PND. GI:  Ostomy working well.  No nausea, vomiting, diarrhea, constipation, melena or hematochezia. GU:  No urgency, frequency, dysuria, and intermittent  hematuria. Musculoskeletal:  No back pain.  No joint pain.  No muscle tenderness. Extremities:  Trouble with legs secondary to peripheral vascular disease.  Gout in left foot, slightly better.   Skin:  Gout.  No rashes or skin changes. Neuro:  Transient cold neuropathy.  No headache, numbness or weakness, balance or coordination issues. Endocrine:  No diabetes, thyroid issues, hot flashes or night sweats. Psych:  No mood changes, depression or anxiety. Review of systems:  All other systems reviewed and found to be negative.   Physical Exam: Blood pressure 169/92, pulse 64, temperature 97.6 F (36.4 C), temperature source Tympanic, resp. rate 18, height _0  (1.778 m), weight 209 lb 7 oz (95 kg).  GENERAL:  Well developed, well nourished, sitting comfortably in a wheelchair the exam room in no acute distress. MENTAL STATUS:  Alert and oriented to person, place and time. HEAD:  Wearing a black cap.  Brown hair.  Slight mustache.  Normocephalic, atraumatic, face symmetric, no Cushingoid features. EYES:  Brown eyes.  Pupils equal round and reactive to light and accomodation.  No conjunctivitis or scleral icterus. ENT:  Oropharynx clear without lesion.  Tongue normal. Mucous membranes moist.  RESPIRATORY:  Clear to auscultation without rales, wheezes or rhonchi. CARDIOVASCULAR:  Regular rate and rhythm without murmur, rub or gallop. ABDOMEN:  Ileostomy.  Brown stool in bag.  Soft, non-tender with active bowel sounds, and no hepatosplenomegaly.  No masses. SKIN:  No rashes, ulcers or lesions. EXTREMITIES: Nail beds dark post chemo.  No skin discoloration.  No palpable cords. LYMPH NODES: No palpable cervical, supraclavicular, axillary or inguinal adenopathy  NEUROLOGICAL: Unremarkable. PSYCH:  Appropriate.   Infusion on 04/15/2015  Component Date Value Ref Range Status  . WBC 04/15/2015 3.8  3.8 - 10.6 K/uL Final  . RBC 04/15/2015 4.11* 4.40 - 5.90 MIL/uL Final  . Hemoglobin 04/15/2015 9.9*  13.0 - 18.0 g/dL Final  . HCT 04/15/2015 32.1* 40.0 - 52.0 % Final  . MCV 04/15/2015 78.2* 80.0 - 100.0 fL Final  . MCH 04/15/2015 24.1* 26.0 - 34.0 pg Final  . MCHC 04/15/2015 30.9* 32.0 - 36.0 g/dL Final  . RDW 04/15/2015 22.5* 11.5 - 14.5 % Final  . Platelets 04/15/2015 143* 150 - 440 K/uL Final  . Neutrophils Relative % 04/15/2015 75   Final  . Neutro Abs 04/15/2015 2.8  1.4 - 6.5 K/uL Final  . Lymphocytes Relative 04/15/2015 9   Final  . Lymphs Abs 04/15/2015 0.3* 1.0 - 3.6 K/uL Final  . Monocytes Relative 04/15/2015 13   Final  . Monocytes Absolute 04/15/2015 0.5  0.2 - 1.0 K/uL Final  . Eosinophils Relative 04/15/2015 2   Final  . Eosinophils Absolute 04/15/2015 0.1  0 - 0.7 K/uL Final  . Basophils Relative 04/15/2015 1   Final  . Basophils Absolute 04/15/2015 0.0  0 - 0.1 K/uL Final  . Sodium 04/15/2015 136  135 - 145 mmol/L Final  . Potassium 04/15/2015 3.8  3.5 - 5.1 mmol/L Final  . Chloride 04/15/2015 105  101 - 111 mmol/L Final  . CO2 04/15/2015 24  22 - 32 mmol/L Final  . Glucose, Bld 04/15/2015 98  65 - 99 mg/dL Final  . BUN 04/15/2015 17  6 - 20 mg/dL Final  . Creatinine, Ser 04/15/2015 1.20  0.61 - 1.24 mg/dL Final  . Calcium 04/15/2015 9.2  8.9 - 10.3 mg/dL Final  . Total Protein 04/15/2015 7.0  6.5 - 8.1 g/dL Final  . Albumin 04/15/2015 3.9  3.5 - 5.0 g/dL Final  . AST 04/15/2015 45* 15 - 41 U/L Final  . ALT 04/15/2015 41  17 - 63 U/L Final  . Alkaline Phosphatase 04/15/2015 87  38 - 126 U/L Final  . Total Bilirubin 04/15/2015 0.5  0.3 - 1.2 mg/dL Final  . GFR calc non Af Amer 04/15/2015 >60  >60 mL/min Final  . GFR calc Af Amer 04/15/2015 >60  >60 mL/min Final   Comment: (NOTE) The eGFR has been calculated using the CKD EPI equation. This calculation has not been validated in all clinical situations. eGFR's persistently <60 mL/min signify possible Chronic Kidney Disease.   . Anion gap 04/15/2015 7  5 - 15 Final  . Magnesium 04/15/2015 1.6* 1.7 - 2.4 mg/dL  Final    Assessment:  JERMAL DISMUKE [name] is a 59 y.o. male with clinical stage IIA (T3N0) rectal cancer.  Colonoscopy on 06/06/2014 revealed a large non-circumferential distal rectal mass. Biopsy was positive for adenocarcinoma.   CT scans revealed a 3 mm pulmonary nodule (indeterminate). There was a large rectal mass suspicious for transmural extension into the mesorectum.There was equivocal perirectal and sigmoid mesocolon nodes. There was no liver metastasis or extrapelvic disease.  Endoscopic ultrasound at [facility] revealed a 70% circumferential mass at 9-14 cm from the anal verge and measuring 9 mm in maximal thickness. Ultrasound suggested breakthrough of the muscularis propria with invasion into the perirectal fat.  He received neoadjuvant chemotherapy (continuous infusion 5FU) and radiation. He received radiation from 07/03/2014 until 08/16/2014. He underwent low anterior resection with diverting loop ileostomy on 10/23/2014.  Pathology revealed a complete response.  Zero of 10 lymph nodes were positive.  He has iron deficiency anemia.  Ferritin was 34 on 10/04/2014.  He is on oral iron.  He has significant peripheral vascular disease. A stent or bypass is plannned.  He has had a gout flare for the past few weeks in his left foot.  He is currently status post 7 cycles of  FOLFOX chemotherapy (12/27/2014 - 03/31/2015).  He is tolerating his chemotherapy well.  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  His magnesium is low.  Plan: 1.  Labs today:  CBC with diff, CMP, Mg. 2.  Cycle #8 FOLFOX chemotherapy today. 3.  RTC in 2 days for pump disconnect. 4.  Magnesium sulfate 1 gm IV today. 5.  Follow-up with PCP regarding gout tomorrow. 6.  Follow-up with vascular surgery and general surgery in 2 weeks to begin planning of needed surgeries. 7.  Encourage continued oral iron. 8.  RTC after surgeries for MD assess and labs (CBC with diff, CMP, Mg, and  CEA).   Lequita Asal, MD  04/15/2015, 11:49 AM

## 2015-04-16 LAB — CEA: CEA: 3.6 ng/mL (ref 0.0–4.7)

## 2015-04-17 ENCOUNTER — Inpatient Hospital Stay: Payer: 59

## 2015-04-17 ENCOUNTER — Other Ambulatory Visit: Payer: Self-pay | Admitting: Hematology and Oncology

## 2015-04-17 DIAGNOSIS — Z5111 Encounter for antineoplastic chemotherapy: Secondary | ICD-10-CM | POA: Diagnosis not present

## 2015-04-17 DIAGNOSIS — C20 Malignant neoplasm of rectum: Secondary | ICD-10-CM

## 2015-04-17 MED ORDER — HEPARIN SOD (PORK) LOCK FLUSH 100 UNIT/ML IV SOLN
500.0000 [IU] | Freq: Once | INTRAVENOUS | Status: AC | PRN
  Administered 2015-04-17: 500 [IU]

## 2015-04-24 ENCOUNTER — Ambulatory Visit: Payer: Managed Care, Other (non HMO)

## 2015-04-25 ENCOUNTER — Encounter: Payer: Self-pay | Admitting: Hematology and Oncology

## 2015-04-29 ENCOUNTER — Encounter: Payer: Self-pay | Admitting: General Surgery

## 2015-05-01 ENCOUNTER — Encounter: Payer: Self-pay | Admitting: General Surgery

## 2015-05-01 ENCOUNTER — Ambulatory Visit (INDEPENDENT_AMBULATORY_CARE_PROVIDER_SITE_OTHER): Payer: 59 | Admitting: General Surgery

## 2015-05-01 VITALS — BP 193/86 | HR 76 | Temp 98.0°F | Ht 70.0 in

## 2015-05-01 DIAGNOSIS — I739 Peripheral vascular disease, unspecified: Secondary | ICD-10-CM

## 2015-05-01 DIAGNOSIS — C20 Malignant neoplasm of rectum: Secondary | ICD-10-CM | POA: Diagnosis not present

## 2015-05-01 DIAGNOSIS — Z01818 Encounter for other preprocedural examination: Secondary | ICD-10-CM | POA: Diagnosis not present

## 2015-05-01 DIAGNOSIS — I70219 Atherosclerosis of native arteries of extremities with intermittent claudication, unspecified extremity: Secondary | ICD-10-CM

## 2015-05-01 NOTE — Patient Instructions (Addendum)
You will need a barium enema prior to surgery. This has been set-up for 05/06/15 at 0800 at [facility]. Arrive at [employer]. You will need to be on clear liquids 24 hours prior.  We will in touch with Dr. Lucky Cowboy and Dr. Nehemiah Massed for surgery clearances. As soon as all of this information has been gathered and your testing is complete, you will be ready for your surgery and we will discuss the details of surgery at that time.  We will need to wait until Dr. Lucky Cowboy says that he is ready for [place] to complete this surgery given your current vascular problems.

## 2015-05-01 NOTE — Progress Notes (Signed)
Surgical Consultation  05/01/2015  [name] Kehr [name] is an 60 y.o. male.   Chief Complaint  Patient presents with  . Follow-up    Discuss Colostomy Reversal     HPI:  60 year old male prior history of rectal cancer returns to the surgery clinic for discussion of ostomy reversal. Asian is 6 months out from a low anterior resection with loop ileostomy creation secondary to his rectal cancer. He's done very well and has completed his chemotherapy. Patient desires very much so to have his ostomy reversed. Patient also with complaints of lower sternum any pain that is decreasing his ability to walk with a history that is consistent with vascular claudication. Patient denies any fevers, chills, nausea, vomiting, diarrhea, constipation. His ostomy works well. Primary pain complaint is that of lower extremity pain with ambulation. He has not had any rectal study since the surgery or completion of chemotherapy.  Past Medical History  Diagnosis Date  . Rectal cancer ([place])   . Cancer ([place])   . Iron deficiency anemia   . Pulmonary nodules   . Hypertension   . Hyperlipidemia   . Hematuria   . Colon cancer ([place])   . Myocardial infarction ([place])   . Ureter, stricture   . Gout     Past Surgical History  Procedure Laterality Date  . Colonoscopy 06/06/14    . Colonoscopy with esophagogastroduodenoscopy (egd)    . Portacath placement  07/01/2014    Dr. Marina Gravel  . Coronary angioplasty with stent placement  03/2004  . Bowel resection N/A 10/23/2014    Procedure: LOW ANTERIOR BOWEL RESECTION;  Surgeon: Sherri Rad, MD;  Location: [facility] ORS;  Service: General;  Laterality: N/A;  . Laparotomy N/A 10/23/2014    Procedure: EXPLORATORY LAPAROTOMY;  Surgeon: Sherri Rad, MD;  Location: [facility] OR[facility];  Service: General;  Laterality: N/A;  . Diverting ileostomy N/A 10/23/2014    Procedure: DIVERTING ILEOSTOMY;  Surgeon: Sherri Rad, MD;  Location: [facility] ORS;  Service: General;  Laterality: N/A;  . Cystoscopy with stent  placement Bilateral 10/23/2014    Procedure: CYSTOSCOPY WITH STENT PLACEMENT,URETHRAL DILATION, LEFT RETROGRADE PYELOGRAM, URETEROSCOPY;  Surgeon: Hollice Espy, MD;  Location: [facility] ORS;  Service: Urology;  Laterality: Bilateral;    Family History  Problem Relation Age of Onset  . Hypertension Brother   . Hypertension Sister   . Hypertension Father   . Heart disease Paternal Grandfather   . Diabetes Father   . Diabetes Brother     Social History:  reports that he quit smoking about 30 years ago. He has never used smokeless tobacco. He reports that he does not drink alcohol or use illicit drugs.  Allergies:  Allergies  Allergen Reactions  . No Known Allergies     Medications reviewed.     ROS  a multipoint review of systems was completed. All pertinent positives and negative findings document within the history of present illness and remainder negative.    BP 193/86 mmHg  Pulse 76  Temp(Src) 98 F (36.7 C) (Oral)  Ht 5\' 10"  (1.778 m)  Physical Exam  Gen.: No acute distress Chest: Clear to auscultation Heart: Regular rate and rhythm Abdomen: Soft , nontender, nondistended. Well-healed midline incision. Right upper quadrant loop ileostomy present, pink, patent , productive of stool and gas.  skin: no Central skin lesions or ulcerations Extremities: palpable femoral pulses bilaterally , no palpable dorsalis pedis pulses on bilateral lower extremities. Skin changes consistent with possible vascular insufficiency.   No results found for  this or any previous visit (from the past 48 hour(s)). No results found.  Assessment/Plan: 1. Preoperative evaluation to rule out surgical contraindication  patient here discuss reversal of his loop ileostomy. Discussed with the patient that this would be an easy surgery however the patency of his low anterior resection anastomosis need to be confirmed prior to surgical planning. We'll obtain barium enema and follow-up in clinic for  discussion of ostomy reversal. - DG Colon W/Cm - Wo/W Kub; Future  2. Rectal cancer [facility])  patient with history of low rectal cancer who was completed neoadjuvant therapy, surgery, adjuvant therapy. Doing very well from a cancer standpoint.  3. Atherosclerotic peripheral vascular disease with intermittent claudication ([place])  patient with significant claudication. Discussed with the patient at timing of surgeries would be important. Since he has a lifestyle limiting claudication discussed that he may wish to have this resolved prior to surgery for his ostomy reversal so that his postoperative course was offered reversal could be improved. We'll discuss with the vascular surgeon first to make the most optimum surgical plan.   Clayburn Pert, MD FACS General Surgeon dermatitis

## 2015-05-06 ENCOUNTER — Ambulatory Visit: Payer: 59

## 2015-05-06 DIAGNOSIS — I739 Peripheral vascular disease, unspecified: Secondary | ICD-10-CM | POA: Diagnosis not present

## 2015-05-06 DIAGNOSIS — I70223 Atherosclerosis of native arteries of extremities with rest pain, bilateral legs: Secondary | ICD-10-CM | POA: Diagnosis not present

## 2015-05-06 DIAGNOSIS — I70213 Atherosclerosis of native arteries of extremities with intermittent claudication, bilateral legs: Secondary | ICD-10-CM | POA: Diagnosis not present

## 2015-05-07 ENCOUNTER — Other Ambulatory Visit: Payer: Self-pay | Admitting: Vascular Surgery

## 2015-05-08 ENCOUNTER — Telehealth: Payer: Self-pay | Admitting: General Surgery

## 2015-05-08 ENCOUNTER — Ambulatory Visit: Payer: Managed Care, Other (non HMO)

## 2015-05-08 NOTE — Telephone Encounter (Signed)
Please give patient number to Central Scheduling. [phone] to reschedule this. If he would like to speak with me in regards to anything else, let me know. Thank you.

## 2015-05-08 NOTE — Telephone Encounter (Signed)
Patient would like to speak with nurse, Amber. He needs to cancel his colon procedure scheduled for tomorrow (05/09/2015) Patient states he has to have his legs worked on (vein/vascular) before he can have his colon procedure and ostomy reversed. He says, per doctor,  it will take 3 weeks for each leg, so it may be a couple months before he can reschedule. The first leg is scheduled to be worked on, on Monday 05/12/15. Amber, please call patient today to speak with him about this. Thanks.

## 2015-05-09 ENCOUNTER — Ambulatory Visit: Payer: 59

## 2015-05-12 ENCOUNTER — Ambulatory Visit
Admission: RE | Admit: 2015-05-12 | Discharge: 2015-05-12 | Disposition: A | Discharge status: Home / Self Care | Payer: 59 | Source: Ambulatory Visit | Attending: Vascular Surgery | Admitting: Vascular Surgery

## 2015-05-12 ENCOUNTER — Encounter
Admission: RE | Disposition: A | Discharge status: Home / Self Care | Payer: Self-pay | Source: Ambulatory Visit | Attending: Vascular Surgery

## 2015-05-12 DIAGNOSIS — C20 Malignant neoplasm of rectum: Secondary | ICD-10-CM | POA: Insufficient documentation

## 2015-05-12 DIAGNOSIS — I739 Peripheral vascular disease, unspecified: Secondary | ICD-10-CM | POA: Diagnosis not present

## 2015-05-12 DIAGNOSIS — Z7982 Long term (current) use of aspirin: Secondary | ICD-10-CM | POA: Diagnosis not present

## 2015-05-12 DIAGNOSIS — I2510 Atherosclerotic heart disease of native coronary artery without angina pectoris: Secondary | ICD-10-CM | POA: Diagnosis not present

## 2015-05-12 DIAGNOSIS — M79609 Pain in unspecified limb: Secondary | ICD-10-CM | POA: Diagnosis not present

## 2015-05-12 DIAGNOSIS — Z87891 Personal history of nicotine dependence: Secondary | ICD-10-CM | POA: Diagnosis not present

## 2015-05-12 DIAGNOSIS — Z79899 Other long term (current) drug therapy: Secondary | ICD-10-CM | POA: Insufficient documentation

## 2015-05-12 DIAGNOSIS — I70222 Atherosclerosis of native arteries of extremities with rest pain, left leg: Secondary | ICD-10-CM | POA: Diagnosis not present

## 2015-05-12 DIAGNOSIS — I10 Essential (primary) hypertension: Secondary | ICD-10-CM | POA: Diagnosis not present

## 2015-05-12 DIAGNOSIS — I70213 Atherosclerosis of native arteries of extremities with intermittent claudication, bilateral legs: Secondary | ICD-10-CM | POA: Diagnosis not present

## 2015-05-12 HISTORY — PX: PERIPHERAL VASCULAR CATHETERIZATION: SHX172C

## 2015-05-12 LAB — CREATININE, SERUM
CREATININE: 1.2 mg/dL (ref 0.61–1.24)
GFR calc Af Amer: >60 mL/min (ref >60)
GFR calc non Af Amer: >60 mL/min (ref >60)

## 2015-05-12 LAB — BUN: BUN: 16 mg/dL (ref 6–20)

## 2015-05-12 SURGERY — ABDOMINAL AORTOGRAM W/LOWER EXTREMITY
Wound class: Clean

## 2015-05-12 MED ORDER — HEPARIN (PORCINE) IN NACL 2-0.9 UNIT/ML-% IJ SOLN
INTRAMUSCULAR | Status: AC
  Filled 2015-05-12: qty 1000

## 2015-05-12 MED ORDER — FAMOTIDINE 20 MG PO TABS: 40.0000 mg | ORAL_TABLET | ORAL | Status: DC | PRN

## 2015-05-12 MED ORDER — LIDOCAINE-EPINEPHRINE (PF) 1 %-1:200000 IJ SOLN
INTRAMUSCULAR | Status: DC | PRN
  Administered 2015-05-12: 10 mL via INTRADERMAL

## 2015-05-12 MED ORDER — FENTANYL CITRATE (PF) 100 MCG/2ML IJ SOLN
INTRAMUSCULAR | Status: AC
  Filled 2015-05-12: qty 2

## 2015-05-12 MED ORDER — ACETAMINOPHEN 325 MG PO TABS: 325.0000 mg | ORAL_TABLET | ORAL | Status: DC | PRN

## 2015-05-12 MED ORDER — FENTANYL CITRATE (PF) 100 MCG/2ML IJ SOLN
INTRAMUSCULAR | Status: DC | PRN
  Administered 2015-05-12 (×2): 50 ug via INTRAVENOUS

## 2015-05-12 MED ORDER — MIDAZOLAM HCL 5 MG/5ML IJ SOLN
INTRAMUSCULAR | Status: AC
  Filled 2015-05-12: qty 5

## 2015-05-12 MED ORDER — PHENOL 1.4 % MT LIQD
1.0000 | OROMUCOSAL | Status: DC | PRN
Start: 2015-05-12 — End: 2015-05-12

## 2015-05-12 MED ORDER — LIDOCAINE-EPINEPHRINE (PF) 1 %-1:200000 IJ SOLN
INTRAMUSCULAR | Status: AC
  Filled 2015-05-12: qty 30

## 2015-05-12 MED ORDER — GUAIFENESIN-DM 100-10 MG/5ML PO SYRP
15.0000 mL | ORAL_SOLUTION | ORAL | Status: DC | PRN
Start: 2015-05-12 — End: 2015-05-12

## 2015-05-12 MED ORDER — IOHEXOL 300 MG/ML  SOLN
INTRAMUSCULAR | Status: DC | PRN
  Administered 2015-05-12: 99 mL via INTRA_ARTERIAL

## 2015-05-12 MED ORDER — HEPARIN SODIUM (PORCINE) 1000 UNIT/ML IJ SOLN
INTRAMUSCULAR | Status: DC | PRN
  Administered 2015-05-12: 4000 [IU] via INTRAVENOUS

## 2015-05-12 MED ORDER — METHYLPREDNISOLONE SODIUM SUCC 125 MG IJ SOLR: 125.0000 mg | INTRAMUSCULAR | Status: DC | PRN

## 2015-05-12 MED ORDER — SODIUM CHLORIDE 0.9 % IV SOLN: 500.0000 mL | Freq: Once | INTRAVENOUS | Status: DC | PRN

## 2015-05-12 MED ORDER — CLOPIDOGREL BISULFATE 75 MG PO TABS: 75.0000 mg | ORAL_TABLET | Freq: Every day | ORAL | Status: DC

## 2015-05-12 MED ORDER — ONDANSETRON HCL 4 MG/2ML IJ SOLN: 4.0000 mg | Freq: Four times a day (QID) | INTRAMUSCULAR | Status: DC | PRN

## 2015-05-12 MED ORDER — DEXTROSE 5 % IV SOLN
1.5000 g | INTRAVENOUS | Status: AC
  Administered 2015-05-12: 1.5 g via INTRAVENOUS

## 2015-05-12 MED ORDER — HYDROMORPHONE HCL 1 MG/ML IJ SOLN
1.0000 mg | Freq: Once | INTRAMUSCULAR | Status: AC
Start: 2015-05-12 — End: 2015-05-12
  Administered 2015-05-12: 1 mg via INTRAVENOUS

## 2015-05-12 MED ORDER — MIDAZOLAM HCL 2 MG/2ML IJ SOLN
INTRAMUSCULAR | Status: DC | PRN
  Administered 2015-05-12: 2 mg via INTRAVENOUS
  Administered 2015-05-12: 1 mg via INTRAVENOUS

## 2015-05-12 MED ORDER — METOPROLOL TARTRATE 1 MG/ML IV SOLN: 2.0000 mg | INTRAVENOUS | Status: DC | PRN

## 2015-05-12 MED ORDER — OXYCODONE-ACETAMINOPHEN 5-325 MG PO TABS: 1.0000 | ORAL_TABLET | ORAL | Status: DC | PRN

## 2015-05-12 MED ORDER — ACETAMINOPHEN 325 MG RE SUPP: 325.0000 mg | RECTAL | Status: DC | PRN

## 2015-05-12 MED ORDER — HYDRALAZINE HCL 20 MG/ML IJ SOLN: 5.0000 mg | INTRAMUSCULAR | Status: DC | PRN

## 2015-05-12 MED ORDER — SODIUM CHLORIDE 0.9 % IV SOLN
INTRAVENOUS | Status: DC
  Administered 2015-05-12: 08:00:00 via INTRAVENOUS

## 2015-05-12 MED ORDER — HEPARIN SODIUM (PORCINE) 1000 UNIT/ML IJ SOLN
INTRAMUSCULAR | Status: AC
  Filled 2015-05-12: qty 1

## 2015-05-12 MED ORDER — LABETALOL HCL 5 MG/ML IV SOLN: 10.0000 mg | INTRAVENOUS | Status: DC | PRN

## 2015-05-12 MED ORDER — HYDROMORPHONE HCL 1 MG/ML IJ SOLN
INTRAMUSCULAR | Status: DC
Start: 2015-05-12 — End: 2015-05-12
  Filled 2015-05-12: qty 1

## 2015-05-12 MED ORDER — HYDROMORPHONE HCL 1 MG/ML IJ SOLN: 0.5000 mg | INTRAMUSCULAR | Status: DC | PRN

## 2015-05-12 SURGICAL SUPPLY — 28 items
BALLN DORADO 6X80X80 (BALLOONS) ×5
BALLN LUTONIX 6X150X130 (BALLOONS) ×5
BALLN ULTRVRSE 3X220X150 (BALLOONS) ×5
BALLN ULTRVRSE 5X300X150 (BALLOONS) ×5
BALLOON DORADO 6X80X80 (BALLOONS) ×3 IMPLANT
BALLOON LUTONIX 6X150X130 (BALLOONS) ×3 IMPLANT
BALLOON ULTRVRSE 3X220X150 (BALLOONS) ×3 IMPLANT
BALLOON ULTRVRSE 5X300X150 (BALLOONS) ×3 IMPLANT
CANNULA 5F STIFF (CANNULA) ×5 IMPLANT
CATH CXI SUPP ANG 4FR 135 (MICROCATHETER) ×3 IMPLANT
CATH CXI SUPP ANG 4FR 135CM (MICROCATHETER) ×5
CATH PIG 70CM (CATHETERS) ×5 IMPLANT
CATH VERT 100CM (CATHETERS) ×5 IMPLANT
DEVICE PRESTO INFLATION (MISCELLANEOUS) ×5 IMPLANT
DEVICE STARCLOSE SE CLOSURE (Vascular Products) ×5 IMPLANT
DEVICE TORQUE (MISCELLANEOUS) ×5 IMPLANT
GLIDEWIRE ADV .035X260CM (WIRE) ×5 IMPLANT
GUIDEWIRE SUPER STIFF .035X180 (WIRE) ×5 IMPLANT
PACK ANGIOGRAPHY (CUSTOM PROCEDURE TRAY) ×5 IMPLANT
SHEATH ANL2 6FRX45 HC (SHEATH) ×5 IMPLANT
SHEATH BRITE TIP 4FRX11 (SHEATH) ×5 IMPLANT
SHEATH BRITE TIP 5FRX11 (SHEATH) ×5 IMPLANT
STENT VIABAHN 6X150X120 (Permanent Stent) ×5 IMPLANT
STENT VIABAHN 6X250X120 (Permanent Stent) ×5 IMPLANT
SYR MEDRAD MARK V 150ML (SYRINGE) ×5 IMPLANT
TUBING CONTRAST HIGH PRESS 72 (TUBING) ×5 IMPLANT
WIRE G V18X300CM (WIRE) ×5 IMPLANT
WIRE J 3MM .035X145CM (WIRE) ×5 IMPLANT

## 2015-05-12 NOTE — Discharge Instructions (Signed)
Angiogram, Care After °Refer to this sheet in the next few weeks. These instructions provide you with information about caring for yourself after your procedure. Your health care provider may also give you more specific instructions. Your treatment has been planned according to current medical practices, but problems sometimes occur. Call your health care provider if you have any problems or questions after your procedure. °WHAT TO EXPECT AFTER THE PROCEDURE °After your procedure, it is typical to have the following: °· Bruising at the catheter insertion site that usually fades within 1-2 weeks. °· Blood collecting in the tissue (hematoma) that may be painful to the touch. It should usually decrease in size and tenderness within 1-2 weeks. °HOME CARE INSTRUCTIONS °· Take medicines only as directed by your health care provider. °· You may shower 24-48 hours after the procedure or as directed by your health care provider. Remove the bandage (dressing) and gently wash the site with plain soap and water. Pat the area dry with a clean towel. Do not rub the site, because this may cause bleeding. °· Do not take baths, swim, or use a hot tub until your health care provider approves. °· Check your insertion site every day for redness, swelling, or drainage. °· Do not apply powder or lotion to the site. °· Do not lift over 10 lb (4.5 kg) for 5 days after your procedure or as directed by your health care provider. °· Ask your health care provider when it is okay to: °¨ Return to work or school. °¨ Resume usual physical activities or sports. °¨ Resume sexual activity. °· Do not drive home if you are discharged the same day as the procedure. Have someone else drive you. °· You may drive 24 hours after the procedure unless otherwise instructed by your health care provider. °· Do not operate machinery or power tools for 24 hours after the procedure or as directed by your health care provider. °· If your procedure was done as an  outpatient procedure, which means that you went home the same day as your procedure, a responsible adult should be with you for the first 24 hours after you arrive home. °· Keep all follow-up visits as directed by your health care provider. This is important. °SEEK MEDICAL CARE IF: °· You have a fever. °· You have chills. °· You have increased bleeding from the catheter insertion site. Hold pressure on the site. °SEEK IMMEDIATE MEDICAL CARE IF: °· You have unusual pain at the catheter insertion site. °· You have redness, warmth, or swelling at the catheter insertion site. °· You have drainage (other than a small amount of blood on the dressing) from the catheter insertion site. °· The catheter insertion site is bleeding, and the bleeding does not stop after 30 minutes of holding steady pressure on the site. °· The area near or just beyond the catheter insertion site becomes pale, cool, tingly, or numb. °  °This information is not intended to replace advice given to you by your health care provider. Make sure you discuss any questions you have with your health care provider. °  °Document Released: 10/22/2004 Document Revised: 04/26/2014 Document Reviewed: 09/06/2012 °Elsevier Interactive Patient Education ©2016 [employer] ° °

## 2015-05-12 NOTE — H&P (Signed)
  [facility] History & Physical Update  The patient was interviewed and re-examined.  The patient's previous History and Physical has been reviewed and is unchanged.  There is no change in the plan of care. We plan to proceed with the scheduled procedure.  DEW,JASON, MD  05/12/2015, 8:01 AM

## 2015-05-12 NOTE — Op Note (Signed)
[name] Percutaneous Study/Intervention Procedural Note   Date of Surgery: 05/12/2015  Surgeon(s):DEW,JASON   Assistants:none  Pre-operative Diagnosis: PAD with rest pain left lower extremity  Post-operative diagnosis: Same  Procedure(s) Performed: 1. Ultrasound guidance for vascular access right femoral artery 2. Catheter placement into left posterior tibial artery from right femoral approach 3. Aortogram and selective left lower extremity angiogram 4. Percutaneous transluminal angioplasty of left mid to distal posterior tibial artery with 3 mm diameter by 22 cm length angioplasty balloon 5. Percutaneous transluminal angioplasty of above-knee popliteal artery and entire SFA with 5 mm diameter conventional angioplasty balloon as well as a 6 mm diameter by 15 cm length Lutonix drug-coated angioplasty balloon for the proximal SFA  6.  Viabahn covered stent 2 to the left SFA and popliteal artery for multiple areas of greater than 50% residual stenosis after angioplasty using a 6 mm diameter by 25 cm length stent and a 6 mm diameter by 15 cm length stent 7. StarClose closure device right femoral artery  EBL: 25 cc  Contrast: 99 cc  Fluro Time: 12.6 minutes  Conscious Sedation Time: approximately 75 minutes using 3 mg of Versed and 100 mcg of Fentanyl  Indications: Patient is a 60 y.o.male with severe peripheral arterial disease. He has already had a bypass in the right lower extremity. He now has worsening claudication in the right leg and rest pain in the left leg. The patient has noninvasive study showing moderate to severely reduced ABIs bilaterally and the left SFA occlusion. The patient is brought in for angiography for further evaluation and potential treatment. Risks and benefits are discussed and informed consent is obtained  Procedure: The patient was  identified and appropriate procedural time out was performed. The patient was then placed supine on the table and prepped and draped in the usual sterile fashion.Moderate conscious sedation was administered throughout the procedure with my supervision of the RN administering medicines and monitoring the patient's vital signs, pulse oximetry, telemetry and mental status throughout from the start of the procedure until the patient was taken to the recovery room Ultrasound was used to evaluate the right common femoral artery. It was patent . A digital ultrasound image was acquired. A Seldinger needle was used to access the right common femoral artery under direct ultrasound guidance and a permanent image was performed. A 0.035 J wire was advanced without resistance and a 5Fr sheath was placed. Pigtail catheter was placed into the aorta and an AP aortogram was performed. This demonstrated normal renal arteries and normal aorta and iliac segments without significant stenosis. I then crossed the aortic bifurcation and advanced to the left femoral head. Selective left lower extremity angiogram was then performed. This demonstrated a flush occlusion of the left SFA with reconstitution of the above-knee popliteal artery. There was then one-vessel runoff distally, but the mid to distal posterior tibial artery which was the only runoff vessel had an occlusion over a short to moderate segment. The patient was systemically heparinized and a 6 [place] [employer] sheath was then placed over the [employer] wire. I then used a Kumpe catheter and the advantage wire to get into the SFA occlusion and navigate across this confirming intraluminal flow in the popliteal artery below the knee. I then exchanged for a CXI catheter and a 0.018 wire and was able to cross the posterior tibial artery occlusion and confirm intraluminal flow at the level of the ankle and the posterior tibial artery. The 0.018 wire was then  replaced and I  elected to proceed with treatment. The posterior tibial artery occlusion was treated with a 3 mm diameter by 22 cm length angioplasty balloon inflated to 10 atm for 1 minute. Completion angiogram showed less than 20% residual stenosis after angioplasty. I then turned my attention to the SFA and popliteal lesion. 2 inflations with a 5 mm diameter by 30 cm length angioplasty balloon were taken from just below the knee up to the origin of the superficial femoral artery. For the proximal superficial femoral artery and additional inflation with a 6 mm diameter by 15 cm length Lutonix drug-coated angioplasty balloon was also performed. The 6 mm balloon was taken to 10 atm whereas the 5 mm balloon was taken to 14 atm. Each inflation was held for approximately 1 minute. Completion angiogram following these inflation sewed the proximal SFA to be patent for the first 8-10 cm with no greater than 30-40% stenosis, but then a high-grade stenosis was present. There were then other areas of stenosis including another high-grade stenosis at Hunter's canal as well as at the reentry point in the above-knee popliteal artery. I elected to cover these areas with stents. It took a 6 mm diameter by 25 cm length as well as a 6 mm diameter by 15 cm length Viabahn stent to go from the level of the knee up to the proximal SFA to encompass all residual lesions. This was postdilated with a 5 mm balloon and an area at Hunter's canal required a 6 mm diameter high pressure angioplasty balloon inflated to 26 atm to resolved the waist. Completion and gram following this showed no greater than 25% residual stenosis and brisk flow distally. I elected to terminate the procedure. The sheath was removed and StarClose closure device was deployed in the left femoral artery with excellent hemostatic result. The patient was taken to the recovery room in stable condition having tolerated the procedure well.  Findings:  Aortogram: Normal renal  arteries, normal aorta and iliac arteries without significant stenosis. Left Lower Extremity: Flush SFA occlusion with reconstitution of the above-knee popliteal artery. Occlusion of the posterior tibial artery in the mid to distal calf which was the only runoff vessel distally. This was improved with intervention.   Disposition: Patient was taken to the recovery room in stable condition having tolerated the procedure well.  Complications: None  DEW,JASON 05/12/2015 9:27 AM

## 2015-05-13 ENCOUNTER — Encounter: Payer: Self-pay | Admitting: Vascular Surgery

## 2015-05-14 NOTE — Telephone Encounter (Signed)
Patient was given number to central scheduling.

## 2015-05-15 ENCOUNTER — Ambulatory Visit: Payer: 59

## 2015-05-26 DIAGNOSIS — M109 Gout, unspecified: Secondary | ICD-10-CM | POA: Diagnosis not present

## 2015-05-26 DIAGNOSIS — I739 Peripheral vascular disease, unspecified: Secondary | ICD-10-CM | POA: Diagnosis not present

## 2015-06-03 ENCOUNTER — Inpatient Hospital Stay: Payer: 59 | Attending: Hematology and Oncology

## 2015-06-03 ENCOUNTER — Inpatient Hospital Stay: Payer: 59

## 2015-06-03 DIAGNOSIS — Z452 Encounter for adjustment and management of vascular access device: Secondary | ICD-10-CM | POA: Diagnosis not present

## 2015-06-03 DIAGNOSIS — I739 Peripheral vascular disease, unspecified: Secondary | ICD-10-CM | POA: Diagnosis not present

## 2015-06-03 DIAGNOSIS — C20 Malignant neoplasm of rectum: Secondary | ICD-10-CM | POA: Diagnosis not present

## 2015-06-03 DIAGNOSIS — I70222 Atherosclerosis of native arteries of extremities with rest pain, left leg: Secondary | ICD-10-CM | POA: Diagnosis not present

## 2015-06-03 MED ORDER — HEPARIN SOD (PORK) LOCK FLUSH 100 UNIT/ML IV SOLN
500.0000 [IU] | Freq: Once | INTRAVENOUS | Status: AC
  Administered 2015-06-03: 500 [IU] via INTRAVENOUS

## 2015-06-03 MED ORDER — HEPARIN SOD (PORK) LOCK FLUSH 100 UNIT/ML IV SOLN
INTRAVENOUS | Status: AC
Start: 2015-06-03 — End: 2015-06-03
  Filled 2015-06-03: qty 5

## 2015-06-03 MED ORDER — SODIUM CHLORIDE 0.9% FLUSH
10.0000 mL | Freq: Once | INTRAVENOUS | Status: AC
Start: 2015-06-03 — End: 2015-06-03
  Administered 2015-06-03: 10 mL via INTRAVENOUS
  Filled 2015-06-03: qty 10

## 2015-06-03 NOTE — Progress Notes (Signed)
Survivorship Care Plan visit completed.  Treatment summary reviewed and given to patient.  ASCO booklet reviewed and given to patient. Discussed CARE program and Cancer Transitions along with other resources available through the [facility].  Patient verbalized understanding.

## 2015-06-09 ENCOUNTER — Other Ambulatory Visit: Payer: Self-pay | Admitting: Vascular Surgery

## 2015-06-15 ENCOUNTER — Encounter: Admission: RE | Payer: Self-pay | Source: Ambulatory Visit

## 2015-06-15 SURGERY — LOWER EXTREMITY ANGIOGRAPHY
Anesthesia: Moderate Sedation | Laterality: Left

## 2015-06-18 ENCOUNTER — Ambulatory Visit: Admission: RE | Admit: 2015-06-18 | Payer: 59 | Source: Ambulatory Visit | Admitting: Vascular Surgery

## 2015-06-22 ENCOUNTER — Other Ambulatory Visit: Payer: Self-pay | Admitting: *Deleted

## 2015-06-22 DIAGNOSIS — C20 Malignant neoplasm of rectum: Secondary | ICD-10-CM

## 2015-06-23 ENCOUNTER — Inpatient Hospital Stay: Payer: 59

## 2015-06-23 ENCOUNTER — Inpatient Hospital Stay: Payer: 59 | Attending: Hematology and Oncology | Admitting: Hematology and Oncology

## 2015-06-23 ENCOUNTER — Ambulatory Visit: Payer: 59 | Admitting: Hematology and Oncology

## 2015-06-23 VITALS — BP 138/78 | HR 59 | Temp 97.3°F | Resp 18 | Wt 213.8 lb

## 2015-06-23 DIAGNOSIS — C20 Malignant neoplasm of rectum: Secondary | ICD-10-CM | POA: Diagnosis not present

## 2015-06-23 DIAGNOSIS — I252 Old myocardial infarction: Secondary | ICD-10-CM | POA: Diagnosis not present

## 2015-06-23 DIAGNOSIS — Z79899 Other long term (current) drug therapy: Secondary | ICD-10-CM | POA: Diagnosis not present

## 2015-06-23 DIAGNOSIS — I10 Essential (primary) hypertension: Secondary | ICD-10-CM | POA: Insufficient documentation

## 2015-06-23 DIAGNOSIS — Z87891 Personal history of nicotine dependence: Secondary | ICD-10-CM | POA: Insufficient documentation

## 2015-06-23 DIAGNOSIS — D509 Iron deficiency anemia, unspecified: Secondary | ICD-10-CM | POA: Diagnosis not present

## 2015-06-23 DIAGNOSIS — M109 Gout, unspecified: Secondary | ICD-10-CM | POA: Insufficient documentation

## 2015-06-23 DIAGNOSIS — Z7982 Long term (current) use of aspirin: Secondary | ICD-10-CM | POA: Diagnosis not present

## 2015-06-23 DIAGNOSIS — Z9221 Personal history of antineoplastic chemotherapy: Secondary | ICD-10-CM

## 2015-06-23 DIAGNOSIS — Z452 Encounter for adjustment and management of vascular access device: Secondary | ICD-10-CM | POA: Insufficient documentation

## 2015-06-23 DIAGNOSIS — E785 Hyperlipidemia, unspecified: Secondary | ICD-10-CM | POA: Insufficient documentation

## 2015-06-23 LAB — CBC WITH DIFFERENTIAL/PLATELET
Basophils Absolute: 0 10*3/uL (ref 0–0.1)
Basophils Relative: 1 %
Eosinophils Absolute: 0.3 10*3/uL (ref 0–0.7)
Eosinophils Relative: 6 %
HCT: 32.7 % — ABNORMAL LOW (ref 40.0–52.0)
Hemoglobin: 10.3 g/dL — ABNORMAL LOW (ref 13.0–18.0)
Lymphocytes Relative: 11 %
Lymphs Abs: 0.5 10*3/uL — ABNORMAL LOW (ref 1.0–3.6)
MCH: 24.5 pg — ABNORMAL LOW (ref 26.0–34.0)
MCHC: 31.3 g/dL — ABNORMAL LOW (ref 32.0–36.0)
MCV: 78.3 fL — ABNORMAL LOW (ref 80.0–100.0)
Monocytes Absolute: 0.4 10*3/uL (ref 0.2–1.0)
Monocytes Relative: 8 %
Neutro Abs: 3.4 10*3/uL (ref 1.4–6.5)
Neutrophils Relative %: 74 %
Platelets: 212 10*3/uL (ref 150–440)
RBC: 4.18 MIL/uL — ABNORMAL LOW (ref 4.40–5.90)
RDW: 19.4 % — ABNORMAL HIGH (ref 11.5–14.5)
WBC: 4.6 10*3/uL (ref 3.8–10.6)

## 2015-06-23 LAB — COMPREHENSIVE METABOLIC PANEL
ALT: 21 U/L (ref 17–63)
AST: 19 U/L (ref 15–41)
Albumin: 3.9 g/dL (ref 3.5–5.0)
Alkaline Phosphatase: 88 U/L (ref 38–126)
Anion gap: 5 (ref 5–15)
BUN: 25 mg/dL — ABNORMAL HIGH (ref 6–20)
CO2: 23 mmol/L (ref 22–32)
Calcium: 8.9 mg/dL (ref 8.9–10.3)
Chloride: 108 mmol/L (ref 101–111)
Creatinine, Ser: 1.23 mg/dL (ref 0.61–1.24)
GFR calc Af Amer: >60 mL/min (ref >60)
GFR calc non Af Amer: >60 mL/min (ref >60)
Glucose, Bld: 88 mg/dL (ref 65–99)
Potassium: 3.6 mmol/L (ref 3.5–5.1)
Sodium: 136 mmol/L (ref 135–145)
Total Bilirubin: 0.3 mg/dL (ref 0.3–1.2)
Total Protein: 6.9 g/dL (ref 6.5–8.1)

## 2015-06-23 LAB — MAGNESIUM: Magnesium: 1.7 mg/dL (ref 1.7–2.4)

## 2015-06-23 NOTE — Progress Notes (Signed)
[place] [facility] day:  06/23/2015   Chief Complaint: [name] CORCORAN [name] is a 60 y.o. male with clinical stage IIA rectal cancer s/p neoadjuvant chemotherapy and radiation followed by low anterior resection who is seen for reassessment.  HPI:  The patient was last seen in the medical oncology clinic on 04/15/2015.  At that time, he received cycle #8 FOLFOX chemotherapy.  He tolerated his chemotherapy well.  He was scheduled to have follow up with vascular surgery and general surgery to begin planning of his needed surgeries.  On 05/12/2015, he underwent percutaneous transluminal angioplasty of the left mid to distal posterior tibial artery and percutaneous transluminal angioplasty of above-knee popliteal artery and entire SFA.  He underwent Viabahn covered stent 2 to the left SFA and popliteal artery for multiple areas of greater than 50% residual stenosis after angioplasty.  He states that his left foot is stiff and has pain (5 out of 10).  He has been told that he may need a bypass graft.  He has follow-up tomorrow.  Regarding his bowels, they are "ok".  He still needs to have his ostomy reversed.  Past Medical History  Diagnosis Date  . Rectal cancer ([place])   . Cancer ([place])   . Iron deficiency anemia   . Pulmonary nodules   . Hypertension   . Hyperlipidemia   . Hematuria   . Colon cancer ([place])   . Myocardial infarction ([place])   . Ureter, stricture   . Gout     Past Surgical History  Procedure Laterality Date  . Colonoscopy 06/06/14    . Colonoscopy with esophagogastroduodenoscopy (egd)    . Portacath placement  07/01/2014    Dr. Marina Gravel  . Coronary angioplasty with stent placement  03/2004  . Bowel resection N/A 10/23/2014    Procedure: LOW ANTERIOR BOWEL RESECTION;  Surgeon: Sherri Rad, MD;  Location: [facility] ORS;  Service: General;  Laterality: N/A;  . Laparotomy N/A 10/23/2014    Procedure: EXPLORATORY LAPAROTOMY;  Surgeon: Sherri Rad, MD;   Location: [facility];  Service: General;  Laterality: N/A;  . Diverting ileostomy N/A 10/23/2014    Procedure: DIVERTING ILEOSTOMY;  Surgeon: Sherri Rad, MD;  Location: [facility];  Service: General;  Laterality: N/A;  . Cystoscopy with stent placement Bilateral 10/23/2014    Procedure: CYSTOSCOPY WITH STENT PLACEMENT,URETHRAL DILATION, LEFT RETROGRADE PYELOGRAM, URETEROSCOPY;  Surgeon: Hollice Espy, MD;  Location: [facility];  Service: Urology;  Laterality: Bilateral;  . Peripheral vascular catheterization N/A 05/12/2015    Procedure: Abdominal Aortogram w/Lower Extremity;  Surgeon: Algernon Huxley, MD;  Location: [facility];  Service: Cardiovascular;  Laterality: N/A;  . Peripheral vascular catheterization  05/12/2015    Procedure: Lower Extremity Intervention;  Surgeon: Algernon Huxley, MD;  Location: [facility];  Service: Cardiovascular;;    Family History  Problem Relation Age of Onset  . Hypertension Brother   . Hypertension Sister   . Hypertension Father   . Heart disease Paternal Grandfather   . Diabetes Father   . Diabetes Brother     Social History:  reports that he quit smoking about 30 years ago. He has never used smokeless tobacco. He reports that he does not drink alcohol or use illicit drugs.  The patient is accompanied by his wife today.  Allergies:  Allergies  Allergen Reactions  . No Known Allergies     Current Medications: Current Outpatient Prescriptions  Medication Sig Dispense Refill  .  allopurinol (ZYLOPRIM) 100 MG tablet Take 50 mg by mouth daily. Reported on 05/12/2015    . Ascorbic Acid (VITAMIN C) 1000 MG tablet Take 1,000 mg by mouth daily.    Marland Kitchen aspirin EC 81 MG tablet Take by mouth.    . cloNIDine (CATAPRES) 0.2 MG tablet Take 0.2 mg by mouth daily.    . clopidogrel (PLAVIX) 75 MG tablet Take 1 tablet (75 mg total) by mouth daily. 30 tablet 11  . colchicine 0.6 MG tablet Take 0.6 mg by mouth daily.    . ferrous sulfate 325 (65 FE) MG tablet Take 325  mg by mouth daily with breakfast.    . B Complex-C (B-COMPLEX WITH VITAMIN C) tablet Take 1 tablet by mouth daily. Reported on 06/23/2015     No current facility-administered medications for this visit.   Facility-Administered Medications Ordered in Other Visits  Medication Dose Route Frequency Provider Last Rate Last Dose  . sodium chloride 0.9 % injection 10 mL  10 mL Intravenous PRN Dallas Schimke, MD   10 mL at 08/26/14 1300  . sodium chloride 0.9 % injection 10 mL  10 mL Intracatheter PRN Lequita Asal, MD   10 mL at 03/19/15 1121    Review of Systems:  GENERAL:  Feels "ok".   No fevers, sweats.  Weight up 4 pounds. PERFORMANCE STATUS (ECOG):  1 HEENT:  No visual changes, runny nose, sore throat, mouth sores or tenderness. Lungs: No shortness of breath or cough.  No hemoptysis. Cardiac:  No chest pain, palpitations, orthopnea, or PND. GI:  Ostomy working well.  No nausea, vomiting, diarrhea, constipation, melena or hematochezia. GU:  No urgency, frequency, dysuria, and intermittent hematuria. Musculoskeletal:  No back pain.  No joint pain.  No muscle tenderness. Extremities:  Peripheral vascular disease s/p angioplasty and stent.   Skin:  No rashes or skin changes. Neuro: No headache, numbness or weakness, balance or coordination issues. Endocrine:  No diabetes, thyroid issues, hot flashes or night sweats. Psych:  No mood changes, depression or anxiety. Review of systems:  All other systems reviewed and found to be negative.   Physical Exam: Blood pressure 138/78, pulse 59, temperature 97.3 F (36.3 C), temperature source Tympanic, resp. rate 18, weight 213 lb 13.5 oz (97 kg).  GENERAL:  Well developed, well nourished, sitting comfortably in a wheelchair the exam room in no acute distress. MENTAL STATUS:  Alert and oriented to person, place and time. HEAD:  Wearing a black cap.  Brown hair.  Slight mustache.  Normocephalic, atraumatic, face symmetric, no Cushingoid  features. EYES:  Brown eyes.  Pupils equal round and reactive to light and accomodation.  No conjunctivitis or scleral icterus. ENT:  Oropharynx clear without lesion.  Tongue normal. Mucous membranes moist.  RESPIRATORY:  Clear to auscultation without rales, wheezes or rhonchi. CARDIOVASCULAR:  Regular rate and rhythm without murmur, rub or gallop. ABDOMEN:  Ileostomy.  Brown stool in bag.  Soft, non-tender with active bowel sounds, and no hepatosplenomegaly.  No masses. SKIN:  No rashes, ulcers or lesions. EXTREMITIES: Pulse in left foot.  Nail beds dark post chemo.  No skin discoloration.  No palpable cords. LYMPH NODES: No palpable cervical, supraclavicular, axillary or inguinal adenopathy  NEUROLOGICAL: Unremarkable. PSYCH:  Appropriate.   Appointment on 06/23/2015  Component Date Value Ref Range Status  . WBC 06/23/2015 4.6  3.8 - 10.6 K/uL Final  . RBC 06/23/2015 4.18* 4.40 - 5.90 MIL/uL Final  . Hemoglobin 06/23/2015 10.3* 13.0 - 18.0  g/dL Final  . HCT 06/23/2015 32.7* 40.0 - 52.0 % Final  . MCV 06/23/2015 78.3* 80.0 - 100.0 fL Final  . MCH 06/23/2015 24.5* 26.0 - 34.0 pg Final  . MCHC 06/23/2015 31.3* 32.0 - 36.0 g/dL Final  . RDW 06/23/2015 19.4* 11.5 - 14.5 % Final  . Platelets 06/23/2015 212  150 - 440 K/uL Final  . Neutrophils Relative % 06/23/2015 74   Final  . Neutro Abs 06/23/2015 3.4  1.4 - 6.5 K/uL Final  . Lymphocytes Relative 06/23/2015 11   Final  . Lymphs Abs 06/23/2015 0.5* 1.0 - 3.6 K/uL Final  . Monocytes Relative 06/23/2015 8   Final  . Monocytes Absolute 06/23/2015 0.4  0.2 - 1.0 K/uL Final  . Eosinophils Relative 06/23/2015 6   Final  . Eosinophils Absolute 06/23/2015 0.3  0 - 0.7 K/uL Final  . Basophils Relative 06/23/2015 1   Final  . Basophils Absolute 06/23/2015 0.0  0 - 0.1 K/uL Final  . Sodium 06/23/2015 136  135 - 145 mmol/L Final  . Potassium 06/23/2015 3.6  3.5 - 5.1 mmol/L Final  . Chloride 06/23/2015 108  101 - 111 mmol/L Final  . CO2  06/23/2015 23  22 - 32 mmol/L Final  . Glucose, Bld 06/23/2015 88  65 - 99 mg/dL Final  . BUN 06/23/2015 25* 6 - 20 mg/dL Final  . Creatinine, Ser 06/23/2015 1.23  0.61 - 1.24 mg/dL Final  . Calcium 06/23/2015 8.9  8.9 - 10.3 mg/dL Final  . Total Protein 06/23/2015 6.9  6.5 - 8.1 g/dL Final  . Albumin 06/23/2015 3.9  3.5 - 5.0 g/dL Final  . AST 06/23/2015 19  15 - 41 U/L Final  . ALT 06/23/2015 21  17 - 63 U/L Final  . Alkaline Phosphatase 06/23/2015 88  38 - 126 U/L Final  . Total Bilirubin 06/23/2015 0.3  0.3 - 1.2 mg/dL Final  . GFR calc non Af Amer 06/23/2015 >60  >60 mL/min Final  . GFR calc Af Amer 06/23/2015 >60  >60 mL/min Final   Comment: (NOTE) The eGFR has been calculated using the CKD EPI equation. This calculation has not been validated in all clinical situations. eGFR's persistently <60 mL/min signify possible Chronic Kidney Disease.   . Anion gap 06/23/2015 5  5 - 15 Final  . Magnesium 06/23/2015 1.7  1.7 - 2.4 mg/dL Final    Assessment:  [name] HANKEN [name] is a 60 y.o. male with clinical stage IIA (T3N0) rectal cancer.  Colonoscopy on 06/06/2014 revealed a large non-circumferential distal rectal mass. Biopsy was positive for adenocarcinoma.   CT scans revealed a 3 mm pulmonary nodule (indeterminate). There was a large rectal mass suspicious for transmural extension into the mesorectum.There was equivocal perirectal and sigmoid mesocolon nodes. There was no liver metastasis or extrapelvic disease.  Endoscopic ultrasound at [facility] revealed a 70% circumferential mass at 9-14 cm from the anal verge and measuring 9 mm in maximal thickness. Ultrasound suggested breakthrough of the muscularis propria with invasion into the perirectal fat.  He received neoadjuvant chemotherapy (continuous infusion 5FU) and radiation. He received radiation from 07/03/2014 until 08/16/2014. He underwent low anterior resection with diverting loop ileostomy on 10/23/2014.  Pathology  revealed a complete response.  Zero of 10 lymph nodes were positive.  He has iron deficiency anemia.  Ferritin was 34 on 10/04/2014.  He is on oral iron.    He received 8 cycles of  FOLFOX chemotherapy (12/27/2014 - 04/15/2015).  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    He has significant peripheral vascular disease.  On 05/12/2015, he underwent angioplasty of the left mid to distal posterior tibial artery, above-knee popliteal artery and entire SFA.  He underwent stent 2 to the left SFA and popliteal artery for multiple areas of greater than 50% residual stenosis after angioplasty.  He has follow-up with vascular surgery on 06/24/2015.  Symptomatically, he has ongoing left foot stiffness and pain.  Exam is unremarkable.  Plan: 1.  Labs today:  CBC with diff, CMP, Mg. 2.  Anticipate ostomy reversal after peripheral vascular disease corrected. 3.  Port flush every 6-8 weeks. 4.  RTC in 3 months for MD assessment and labs (CBC with diff, CMP, Mg, ferritin, and CEA).   Lequita Asal, MD  06/23/2015, 3:28 PM

## 2015-06-24 ENCOUNTER — Other Ambulatory Visit: Payer: Self-pay | Admitting: Vascular Surgery

## 2015-06-24 DIAGNOSIS — I739 Peripheral vascular disease, unspecified: Secondary | ICD-10-CM | POA: Diagnosis not present

## 2015-06-24 DIAGNOSIS — I70222 Atherosclerosis of native arteries of extremities with rest pain, left leg: Secondary | ICD-10-CM | POA: Diagnosis not present

## 2015-06-24 LAB — CEA (SERIAL MONITOR): CEA: 2.5 ng/mL (ref 0.0–4.7)

## 2015-06-30 ENCOUNTER — Encounter
Admission: RE | Disposition: A | Discharge status: Home / Self Care | Payer: Self-pay | Source: Ambulatory Visit | Attending: Vascular Surgery

## 2015-06-30 ENCOUNTER — Encounter: Payer: Self-pay | Admitting: *Deleted

## 2015-06-30 ENCOUNTER — Ambulatory Visit
Admission: RE | Admit: 2015-06-30 | Discharge: 2015-06-30 | Disposition: A | Discharge status: Home / Self Care | Payer: 59 | Source: Ambulatory Visit | Attending: Vascular Surgery | Admitting: Vascular Surgery

## 2015-06-30 DIAGNOSIS — I2510 Atherosclerotic heart disease of native coronary artery without angina pectoris: Secondary | ICD-10-CM | POA: Insufficient documentation

## 2015-06-30 DIAGNOSIS — C20 Malignant neoplasm of rectum: Secondary | ICD-10-CM | POA: Insufficient documentation

## 2015-06-30 DIAGNOSIS — I70211 Atherosclerosis of native arteries of extremities with intermittent claudication, right leg: Secondary | ICD-10-CM | POA: Diagnosis not present

## 2015-06-30 DIAGNOSIS — M79609 Pain in unspecified limb: Secondary | ICD-10-CM | POA: Insufficient documentation

## 2015-06-30 DIAGNOSIS — Z79899 Other long term (current) drug therapy: Secondary | ICD-10-CM | POA: Diagnosis not present

## 2015-06-30 DIAGNOSIS — I70213 Atherosclerosis of native arteries of extremities with intermittent claudication, bilateral legs: Secondary | ICD-10-CM | POA: Diagnosis not present

## 2015-06-30 DIAGNOSIS — I739 Peripheral vascular disease, unspecified: Secondary | ICD-10-CM | POA: Insufficient documentation

## 2015-06-30 DIAGNOSIS — I70222 Atherosclerosis of native arteries of extremities with rest pain, left leg: Secondary | ICD-10-CM | POA: Diagnosis not present

## 2015-06-30 DIAGNOSIS — Z87891 Personal history of nicotine dependence: Secondary | ICD-10-CM | POA: Insufficient documentation

## 2015-06-30 DIAGNOSIS — Z9889 Other specified postprocedural states: Secondary | ICD-10-CM | POA: Insufficient documentation

## 2015-06-30 DIAGNOSIS — Z8249 Family history of ischemic heart disease and other diseases of the circulatory system: Secondary | ICD-10-CM | POA: Insufficient documentation

## 2015-06-30 DIAGNOSIS — Z7982 Long term (current) use of aspirin: Secondary | ICD-10-CM | POA: Insufficient documentation

## 2015-06-30 DIAGNOSIS — E785 Hyperlipidemia, unspecified: Secondary | ICD-10-CM | POA: Diagnosis not present

## 2015-06-30 DIAGNOSIS — I10 Essential (primary) hypertension: Secondary | ICD-10-CM | POA: Insufficient documentation

## 2015-06-30 HISTORY — PX: PERIPHERAL VASCULAR CATHETERIZATION: SHX172C

## 2015-06-30 HISTORY — DX: Atherosclerotic heart disease of native coronary artery without angina pectoris: I25.10

## 2015-06-30 HISTORY — DX: Unspecified atherosclerosis: I70.90

## 2015-06-30 HISTORY — DX: Peripheral vascular disease, unspecified: I73.9

## 2015-06-30 SURGERY — ABDOMINAL AORTOGRAM W/LOWER EXTREMITY
Wound class: Clean

## 2015-06-30 MED ORDER — FENTANYL CITRATE (PF) 100 MCG/2ML IJ SOLN
INTRAMUSCULAR | Status: AC
  Filled 2015-06-30: qty 2

## 2015-06-30 MED ORDER — ONDANSETRON HCL 4 MG/2ML IJ SOLN: 4.0000 mg | Freq: Four times a day (QID) | INTRAMUSCULAR | Status: DC | PRN

## 2015-06-30 MED ORDER — MIDAZOLAM HCL 2 MG/2ML IJ SOLN
INTRAMUSCULAR | Status: DC | PRN
  Administered 2015-06-30: 2 mg via INTRAVENOUS
  Administered 2015-06-30 (×2): 1 mg via INTRAVENOUS

## 2015-06-30 MED ORDER — IOHEXOL 300 MG/ML  SOLN
INTRAMUSCULAR | Status: DC | PRN
  Administered 2015-06-30: 90 mL via INTRA_ARTERIAL

## 2015-06-30 MED ORDER — GUAIFENESIN-DM 100-10 MG/5ML PO SYRP: 15.0000 mL | ORAL_SOLUTION | ORAL | Status: DC | PRN

## 2015-06-30 MED ORDER — LIDOCAINE-EPINEPHRINE (PF) 1 %-1:200000 IJ SOLN
INTRAMUSCULAR | Status: DC | PRN
  Administered 2015-06-30: 10 mL via INTRADERMAL

## 2015-06-30 MED ORDER — ACETAMINOPHEN 325 MG RE SUPP: 325.0000 mg | RECTAL | Status: DC | PRN

## 2015-06-30 MED ORDER — ACETAMINOPHEN 325 MG PO TABS: 325.0000 mg | ORAL_TABLET | ORAL | Status: DC | PRN

## 2015-06-30 MED ORDER — METOPROLOL TARTRATE 1 MG/ML IV SOLN: 2.0000 mg | INTRAVENOUS | Status: DC | PRN

## 2015-06-30 MED ORDER — HEPARIN SODIUM (PORCINE) 1000 UNIT/ML IJ SOLN
INTRAMUSCULAR | Status: DC | PRN
  Administered 2015-06-30: 5000 [IU] via INTRAVENOUS

## 2015-06-30 MED ORDER — LIDOCAINE-EPINEPHRINE (PF) 1 %-1:200000 IJ SOLN
INTRAMUSCULAR | Status: AC
  Filled 2015-06-30: qty 30

## 2015-06-30 MED ORDER — MIDAZOLAM HCL 5 MG/5ML IJ SOLN
INTRAMUSCULAR | Status: AC
  Filled 2015-06-30: qty 5

## 2015-06-30 MED ORDER — HYDROMORPHONE HCL 1 MG/ML IJ SOLN: 0.5000 mg | INTRAMUSCULAR | Status: DC | PRN

## 2015-06-30 MED ORDER — HEPARIN (PORCINE) IN NACL 2-0.9 UNIT/ML-% IJ SOLN
INTRAMUSCULAR | Status: AC
  Filled 2015-06-30: qty 1000

## 2015-06-30 MED ORDER — SODIUM CHLORIDE 0.9 % IV SOLN
INTRAVENOUS | Status: DC
  Administered 2015-06-30: 12:00:00 via INTRAVENOUS

## 2015-06-30 MED ORDER — ALTEPLASE 2 MG IJ SOLR
INTRAMUSCULAR | Status: AC
  Filled 2015-06-30: qty 4

## 2015-06-30 MED ORDER — PHENOL 1.4 % MT LIQD: 1.0000 | OROMUCOSAL | Status: DC | PRN

## 2015-06-30 MED ORDER — METHYLPREDNISOLONE SODIUM SUCC 125 MG IJ SOLR: 125.0000 mg | INTRAMUSCULAR | Status: DC | PRN

## 2015-06-30 MED ORDER — HYDROMORPHONE HCL 1 MG/ML IJ SOLN: 1.0000 mg | Freq: Once | INTRAMUSCULAR | Status: DC

## 2015-06-30 MED ORDER — FENTANYL CITRATE (PF) 100 MCG/2ML IJ SOLN
INTRAMUSCULAR | Status: DC | PRN
  Administered 2015-06-30 (×3): 50 ug via INTRAVENOUS

## 2015-06-30 MED ORDER — CEFUROXIME SODIUM 1.5 G IJ SOLR
1.5000 g | INTRAMUSCULAR | Status: AC
  Administered 2015-06-30: 1.5 g via INTRAVENOUS

## 2015-06-30 MED ORDER — APIXABAN 5 MG PO TABS: 5.0000 mg | ORAL_TABLET | Freq: Two times a day (BID) | ORAL | Status: DC

## 2015-06-30 MED ORDER — HYDRALAZINE HCL 20 MG/ML IJ SOLN: 5.0000 mg | INTRAMUSCULAR | Status: DC | PRN

## 2015-06-30 MED ORDER — FAMOTIDINE 20 MG PO TABS: 40.0000 mg | ORAL_TABLET | ORAL | Status: DC | PRN

## 2015-06-30 MED ORDER — SODIUM CHLORIDE 0.9 % IV SOLN: 500.0000 mL | Freq: Once | INTRAVENOUS | Status: DC | PRN

## 2015-06-30 MED ORDER — HEPARIN SODIUM (PORCINE) 1000 UNIT/ML IJ SOLN
INTRAMUSCULAR | Status: AC
  Filled 2015-06-30: qty 1

## 2015-06-30 MED ORDER — OXYCODONE-ACETAMINOPHEN 5-325 MG PO TABS: 1.0000 | ORAL_TABLET | ORAL | Status: DC | PRN

## 2015-06-30 MED ORDER — LABETALOL HCL 5 MG/ML IV SOLN: 10.0000 mg | INTRAVENOUS | Status: DC | PRN

## 2015-06-30 SURGICAL SUPPLY — 27 items
BALLN LUTONIX 4X150X130 (BALLOONS) ×4
BALLN LUTONIX 5X150X130 (BALLOONS) ×4
BALLN LUTONIX 6X150X130 (BALLOONS) ×4
BALLN ULTRVRSE 3X300X150 (BALLOONS) ×1
BALLN ULTRVRSE 3X300X150 OTW (BALLOONS) ×3
BALLN ULTRVRSE 6X80X130 (BALLOONS) ×4
BALLOON LUTONIX 4X150X130 (BALLOONS) ×3 IMPLANT
BALLOON LUTONIX 5X150X130 (BALLOONS) ×3 IMPLANT
BALLOON LUTONIX 6X150X130 (BALLOONS) ×3 IMPLANT
BALLOON ULTRVRSE 3X300X150 OTW (BALLOONS) ×3 IMPLANT
BALLOON ULTRVRSE 6X80X130 (BALLOONS) ×3 IMPLANT
CATH PIG 70CM (CATHETERS) ×4 IMPLANT
CATH VERT 100CM (CATHETERS) ×4 IMPLANT
DEVICE PRESTO INFLATION (MISCELLANEOUS) ×4 IMPLANT
DEVICE SOLENT OMNI 120CM (CATHETERS) ×4 IMPLANT
DEVICE STARCLOSE SE CLOSURE (Vascular Products) ×4 IMPLANT
PACK ANGIOGRAPHY (CUSTOM PROCEDURE TRAY) ×4 IMPLANT
SHEATH ANL2 6FRX45 HC (SHEATH) ×4 IMPLANT
SHEATH BRITE TIP 4FRX11 (SHEATH) ×4 IMPLANT
SHEATH BRITE TIP 5FRX11 (SHEATH) ×4 IMPLANT
STENT VIABAHN 6X100X120 (Permanent Stent) ×4 IMPLANT
STENT VIABAHN 6X7.5X120 (Permanent Stent) ×4 IMPLANT
SYR MEDRAD MARK V 150ML (SYRINGE) ×4 IMPLANT
TUBING CONTRAST HIGH PRESS 72 (TUBING) ×4 IMPLANT
WIRE AMPLATZ SSTIFF .035X260CM (WIRE) ×4 IMPLANT
WIRE G V18X300CM (WIRE) ×4 IMPLANT
WIRE J 3MM .035X145CM (WIRE) ×4 IMPLANT

## 2015-06-30 NOTE — Discharge Instructions (Signed)

## 2015-06-30 NOTE — Op Note (Signed)
[facility] Percutaneous Study/Intervention Procedural Note   Date of Surgery: 06/30/2015  Surgeon(s):DEW,JASON   Assistants:none  Pre-operative Diagnosis: PAD with rest pain left lower extremity after previous intervention  Post-operative diagnosis: Same  Procedure(s) Performed: 1. Ultrasound guidance for vascular access right femoral artery 2. Catheter placement into left posterior tibial artery from right femoral approach 3. Aortogram and selective left lower extremity angiogram  4.  Catheter directed thrombolysis with 4 mg of TPA delivered with the AngioJet Omni catheter 5. Percutaneous transluminal angioplasty of left posterior tibial artery with 3 mm diameter angioplasty balloon and percutaneous transluminal angioplasty of the left tibioperoneal trunk with 4 mm diameter by 15 cm length Lutonix drug-coated angioplasty balloon 6. Percutaneous transluminal angioplasty of left popliteal artery with 5 mm diameter Lutonix drug-coated angioplasty balloon  7.  Percutaneous transluminal angioplasty of the left SFA with 6 mm diameter Lutonix drug-coated angioplasty balloon proximally and 5 mm diameter angioplasty balloon in the mid and distal segments  8.  Mechanical rheolytic thrombectomy with the AngioJet Omni catheter to the left SFA, popliteal artery, tibioperoneal trunk, and proximal posterior tibial artery  9.  Viabahn covered stent placement with 6 mm diameter by 10 cm length stent to the proximal SFA for greater than 50% residual stenosis and thrombosis after above procedures  10. Viabahn covered stent placement with a 6 mm diameter by 7.5 cm length stent to the distal popliteal artery and tibioperoneal trunk for greater than 50% residual stenosis and thrombosis after above procedures 11. StarClose closure device right femoral artery  EBL: 25 cc  Contrast: 90 cc  Fluro Time:  12.1 minutes  Moderate Conscious Sedation Time: approximately 90 minutes using 4 mg of Versed and 150 mcg of Fentanyl  Indications: Patient is a 60 y.o.male with severe peripheral arterial disease now with pain worrisome for rest pain of the left foot. The patient has noninvasive study showing occlusion of his previous interventions on the left. The patient is brought in for angiography for further evaluation and potential treatment. Risks and benefits are discussed and informed consent is obtained  Procedure: The patient was identified and appropriate procedural time out was performed. The patient was then placed supine on the table and prepped and draped in the usual sterile fashion.Moderate conscious sedation was administered during a face to face encounter with the patient throughout the procedure with my supervision of the RN administering medicines and monitoring the patient's vital signs, pulse oximetry, telemetry and mental status throughout from the start of the procedure until the patient was taken to the recovery room. Ultrasound was used to evaluate the right common femoral artery. It was patent . A digital ultrasound image was acquired. A Seldinger needle was used to access the right common femoral artery under direct ultrasound guidance and a permanent image was performed. A 0.035 J wire was advanced without resistance and a 5Fr sheath was placed. Pigtail catheter was placed into the aorta and an AP aortogram was performed. This demonstrated normal renal arteries and normal aorta and iliac segments without significant stenosis. I then crossed the aortic bifurcation and advanced to the left femoral head. Selective left lower extremity angiogram was then performed. This demonstrated thrombosis and occlusion of the left SFA, popliteal artery, and tibioperoneal trunk with reconstitution of the posterior tibial artery in the proximal to mid segments. This was the only runoff  distally through the posterior tibial artery. The patient was systemically heparinized and a 6 [place] Ansell sheath was then placed over the  Terumo Advantage wire. I then used a Kumpe catheter and the advantage wire to navigate through the occlusion with surprisingly little difficulty which would be consistent with thrombosis and confirm intraluminal flow in the mid posterior tibial artery. With the obvious thrombosis, I elected to place 4 mg of TPA and a power pulse spray fashion to perform lysis of the left SFA, popliteal artery, tibioperoneal trunk, and proximal posterior tibial artery. The AngioJet Omni catheter was used. This was allowed to dwell for 10-15 minutes. Mechanical rheolytic thrombectomy was performed with the AngioJet catheter and a little under 200 cc of effluent were returned from the left SFA, popliteal artery, tibioperoneal trunk, and proximal posterior tibial artery. Following this, minimal flow was still seen. I began performing angioplasty distally in the posterior tibial artery with a 3 mm diameter by 30 cm length throughout much of the posterior tibial artery. This was inflated to 14 atm for 1 minute The tibioperoneal trunk was treated with the 3 mm diameter conventional balloon and then a 4 mm diameter by 15 cm length Lutonix drug-coated angioplasty balloon. This was inflated to 8 atm for 1 minute The popliteal artery just below the previously placed stent and at the end of the previous stent was treated with a 5 mm diameter by 15 cm length Lutonix drug-coated angioplasty balloon. This was inflated to 8 atm for 1 minute. This 5 mm balloon was then used with 2 more inflations for the distal SFA and mid superficial femoral artery. The proximal SFA was treated with a 6 mm diameter by 15 cm length Lutonix drug-coated angioplasty balloon inflated to 10 atm for 1 minute. Completion imaging following these interventions showed about 3-4 cm beyond the origin of the SFA a high-grade stenosis and  residual thrombus was present. I had already exchanged for a 0.018 wire. I elected to use a 6 mm diameter by 10 cm length Viabahn covered stent that came to about 1-1/2-2 cm below the femoral bifurcation to treat this lesion. The distal extent of the stent went about 1-2 cm into the previous stent. This was postdilated with 6 mm balloon with less than 30% residual stenosis identified. There were however issues with the distal flow at this point. There was residual thrombus and stenosis in the popliteal artery just below the previously placed stent and extending down into the tibioperoneal trunk. I elected to cover this area with a 6 mm diameter by 7.5 cm length Viabahn covered stent taken into the tibioperoneal trunk and back about 1 cm into the previously placed stent in the mid to distal popliteal artery. At this point, some thrombus was seen downstream in the posterior tibial artery that was then treated with an additional inflation of the 3 mm diameter by 30 cm length angioplasty balloon throughout the majority of the posterior tibial artery. This was inflated to 14 atm for 1 minute. Completion angiogram following this showed reasonably brisk flow distally to the foot through the posterior tibial artery as the only runoff distally. At this point, I felt we had improved his flow is much as possible. I elected to terminate the procedure. The sheath was removed and StarClose closure device was deployed in the left femoral artery with excellent hemostatic result. The patient was taken to the recovery room in stable condition having tolerated the procedure well.  Findings:  Aortogram: Normal renal arteries, normal aorta and iliac arteries without significant stenosis Left Lower Extremity: Thrombosis and occlusion of the left SFA, popliteal artery, and tibioperoneal trunk  with reconstitution of the posterior tibial artery in the proximal to mid segments. This was the only runoff distally  through the posterior tibial artery. Flow restored with percutaneous intervention which was extensive   Disposition: Patient was taken to the recovery room in stable condition having tolerated the procedure well.  Complications: None  DEW,JASON 06/30/2015 2:49 PM

## 2015-06-30 NOTE — H&P (Signed)
  [facility] History & Physical Update  The patient was interviewed and re-examined.  The patient's previous History and Physical has been reviewed and is unchanged.  There is no change in the plan of care. We plan to proceed with the scheduled procedure.  Caydee Talkington, MD  06/30/2015, 11:41 AM

## 2015-07-01 ENCOUNTER — Encounter: Payer: Self-pay | Admitting: Vascular Surgery

## 2015-07-13 ENCOUNTER — Encounter: Payer: Self-pay | Admitting: Hematology and Oncology

## 2015-07-15 NOTE — Telephone Encounter (Deleted)
Please call and schedule patient for

## 2015-07-15 NOTE — Telephone Encounter (Signed)
Error

## 2015-07-18 ENCOUNTER — Inpatient Hospital Stay: Payer: 59

## 2015-07-18 DIAGNOSIS — C20 Malignant neoplasm of rectum: Secondary | ICD-10-CM | POA: Diagnosis not present

## 2015-07-18 DIAGNOSIS — I10 Essential (primary) hypertension: Secondary | ICD-10-CM | POA: Diagnosis not present

## 2015-07-18 DIAGNOSIS — Z95828 Presence of other vascular implants and grafts: Secondary | ICD-10-CM

## 2015-07-18 DIAGNOSIS — Z452 Encounter for adjustment and management of vascular access device: Secondary | ICD-10-CM | POA: Diagnosis not present

## 2015-07-18 DIAGNOSIS — I252 Old myocardial infarction: Secondary | ICD-10-CM | POA: Diagnosis not present

## 2015-07-18 DIAGNOSIS — Z9221 Personal history of antineoplastic chemotherapy: Secondary | ICD-10-CM | POA: Diagnosis not present

## 2015-07-18 DIAGNOSIS — D509 Iron deficiency anemia, unspecified: Secondary | ICD-10-CM | POA: Diagnosis not present

## 2015-07-18 DIAGNOSIS — E785 Hyperlipidemia, unspecified: Secondary | ICD-10-CM | POA: Diagnosis not present

## 2015-07-18 DIAGNOSIS — Z79899 Other long term (current) drug therapy: Secondary | ICD-10-CM | POA: Diagnosis not present

## 2015-07-18 DIAGNOSIS — M109 Gout, unspecified: Secondary | ICD-10-CM | POA: Diagnosis not present

## 2015-07-18 MED ORDER — HEPARIN SOD (PORK) LOCK FLUSH 100 UNIT/ML IV SOLN
500.0000 [IU] | Freq: Once | INTRAVENOUS | Status: AC
  Administered 2015-07-18: 500 [IU] via INTRAVENOUS

## 2015-07-18 MED ORDER — SODIUM CHLORIDE 0.9% FLUSH
10.0000 mL | INTRAVENOUS | Status: DC | PRN
  Administered 2015-07-18: 10 mL via INTRAVENOUS
  Filled 2015-07-18: qty 10

## 2015-07-21 DIAGNOSIS — I739 Peripheral vascular disease, unspecified: Secondary | ICD-10-CM | POA: Diagnosis not present

## 2015-07-21 DIAGNOSIS — C20 Malignant neoplasm of rectum: Secondary | ICD-10-CM | POA: Diagnosis not present

## 2015-07-21 DIAGNOSIS — I70222 Atherosclerosis of native arteries of extremities with rest pain, left leg: Secondary | ICD-10-CM | POA: Diagnosis not present

## 2015-07-24 ENCOUNTER — Telehealth: Payer: Self-pay | Admitting: General Surgery

## 2015-07-24 NOTE — Telephone Encounter (Signed)
Spoke with Dr. Adonis Huguenin regarding this patient. We cannot complete a surgery until 6 weeks out from vascular surgery, this makes this date 08/12/15.  Call made to patient's wife, Meredith Mody. Patient placed on schedule 08/15/15 for consult regarding takedown.

## 2015-07-24 NOTE — Telephone Encounter (Signed)
Amber, please call patients wife. She would like to speak with you about when it is time to set up surgery to reverse her husbands (patients) ostomy. She wanted to speak with you about that and coordinating it with his cancer doctor, etc. Please call when able.

## 2015-08-15 ENCOUNTER — Encounter: Payer: Self-pay | Admitting: General Surgery

## 2015-08-15 ENCOUNTER — Ambulatory Visit (INDEPENDENT_AMBULATORY_CARE_PROVIDER_SITE_OTHER): Payer: 59 | Admitting: General Surgery

## 2015-08-15 ENCOUNTER — Telehealth: Payer: Self-pay

## 2015-08-15 VITALS — BP 151/85 | HR 62 | Temp 98.4°F | Ht 70.0 in | Wt 218.2 lb

## 2015-08-15 DIAGNOSIS — C20 Malignant neoplasm of rectum: Secondary | ICD-10-CM | POA: Diagnosis not present

## 2015-08-15 NOTE — Patient Instructions (Signed)
We have an appointment for the Barium enema on Aug 25, 2015 @ 8:00 am.  Nothing to eat or drink after midnight the night before.  We will call you with the test results.

## 2015-08-15 NOTE — Progress Notes (Signed)
Outpatient Surgical Follow Up  08/15/2015  [name] Kehr [name] is an 60 y.o. male.   Chief Complaint  Patient presents with  . Follow-up    Discuss Colostomy takedown    HPI: 60 year old male well-known to the surgery service returns to clinic today to again discuss loop ileostomy takedown. Agent has a history of low rectal cancer and is status post neoadjuvant therapy, low anterior resection with anastomosis and loop ileostomy creation, adjuvant therapy with chemotherapy and radiation. Patient was previously evaluated for this in January after he completed his adjuvant therapy but was unable to pursue ostomy takedown secondary to new onset claudication. He has since completed multiple interventions with vascular therapy and is here again today to discuss takedown. Patient states his ostomy has been working fine and denies any fevers, chills, nausea, vomiting, diarrhea, chest pain, shortness of breath. He does continue to have limitations in mobility secondary to lower limb pain at this time.  Past Medical History  Diagnosis Date  . Rectal cancer ([place])   . Cancer ([place])   . Iron deficiency anemia   . Pulmonary nodules   . Hypertension   . Hyperlipidemia   . Hematuria   . Colon cancer ([place])   . Myocardial infarction ([place])   . Ureter, stricture   . Gout   . Atherosclerosis   . Peripheral vascular disease ([place])   . Coronary artery disease     Past Surgical History  Procedure Laterality Date  . Colonoscopy 06/06/14    . Colonoscopy with esophagogastroduodenoscopy (egd)    . Portacath placement  07/01/2014    Dr. Marina Gravel  . Coronary angioplasty with stent placement  03/2004  . Bowel resection N/A 10/23/2014    Procedure: LOW ANTERIOR BOWEL RESECTION;  Surgeon: Sherri Rad, MD;  Location: [facility];  Service: General;  Laterality: N/A;  . Laparotomy N/A 10/23/2014    Procedure: EXPLORATORY LAPAROTOMY;  Surgeon: Sherri Rad, MD;  Location: [facility];  Service: General;  Laterality: N/A;  .  Diverting ileostomy N/A 10/23/2014    Procedure: DIVERTING ILEOSTOMY;  Surgeon: Sherri Rad, MD;  Location: [facility];  Service: General;  Laterality: N/A;  . Cystoscopy with stent placement Bilateral 10/23/2014    Procedure: CYSTOSCOPY WITH STENT PLACEMENT,URETHRAL DILATION, LEFT RETROGRADE PYELOGRAM, URETEROSCOPY;  Surgeon: Hollice Espy, MD;  Location: [facility];  Service: Urology;  Laterality: Bilateral;  . Peripheral vascular catheterization N/A 05/12/2015    Procedure: Abdominal Aortogram w/Lower Extremity;  Surgeon: Algernon Huxley, MD;  Location: [facility];  Service: Cardiovascular;  Laterality: N/A;  . Peripheral vascular catheterization  05/12/2015    Procedure: Lower Extremity Intervention;  Surgeon: Algernon Huxley, MD;  Location: [place];  Service: Cardiovascular;;  . Peripheral vascular catheterization N/A 06/30/2015    Procedure: Abdominal Aortogram w/Lower Extremity;  Surgeon: Algernon Huxley, MD;  Location: [place] [facility];  Service: Cardiovascular;  Laterality: N/A;  . Peripheral vascular catheterization  06/30/2015    Procedure: Lower Extremity Intervention;  Surgeon: Algernon Huxley, MD;  Location: [place] [facility];  Service: Cardiovascular;;    Family History  Problem Relation Age of Onset  . Hypertension Brother   . Hypertension Sister   . Hypertension Father   . Heart disease Paternal Grandfather   . Diabetes Father   . Diabetes Brother     Social History:  reports that he quit smoking about 30 years ago. He has never used smokeless tobacco. He reports that he does not drink  alcohol or use illicit drugs.  Allergies:  Allergies  Allergen Reactions  . No Known Allergies     Medications reviewed.    ROS A multipoint review of systems was completed. All pertinent positives and negatives are documented in the history of present illness and remainder are negative.   BP 151/85 mmHg  Pulse 62  Temp(Src) 98.4 F (36.9 C) (Oral)  Ht 5\' 10"  (1.778 m)  Wt  98.975 kg (218 lb 3.2 oz)  BMI 31.31 kg/m2  Physical Exam Gen.: No acute distress Neck: Supple and nontender Lymph adenopathy no evidence of cervical or clavicular lymphadenopathy Chest: Clear to auscultation, Chemo-Port in place Heart: Regular rate and rhythm Abdomen: Soft, nontender, nondistended. Well-healed midline incision. Loop ileostomy visible in the right lower quadrant that is pink, patent, productive of stool and gas. Extremities: Decreased mobility of the left lower extremity but moves bilateral extremities well. Neuro: Cranial nerves grossly intact Psych: Alert and oriented 3.    No results found for this or any previous visit (from the past 48 hour(s)). No results found.  Assessment/Plan:  1. Rectal cancer [name] [name]) 60 year old male with a history of rectal cancer. The procedure of a loop ileostomy takedown and Chemo-Port removal were described in detail to the patient and his wife. The risks, benefits, alternatives were discussed and all questions were answered to their satisfaction. Discussed that prior to taking out his ileostomy he would require barium enema to confirm his anastomosis was intact without stricture or leak. Barring any unforeseen findings on the barium enema we will plan for a loop ileostomy takedown on May 22. We will obtain the okay from oncology remove the port prior to that time. - DG Colon W/Cm - Wo/W Kub; Future     Clayburn Pert, MD [facility] General Surgeon  08/15/2015,9:32 AM

## 2015-08-15 NOTE — Telephone Encounter (Signed)
Call made to nurse for Dr. Mike Gip at this time to ask for order for port removal. Patient scheduled for Ileostomy takedown on 09/08/15 and we would like to remove the port at the same time if possible. Fax number given to fax order and return phone number given as well.

## 2015-08-18 ENCOUNTER — Telehealth: Payer: Self-pay | Admitting: General Surgery

## 2015-08-18 NOTE — Telephone Encounter (Signed)
Pt advised of pre op date/time and sx date. Sx: 09/08/15 with Dr Jolene Provost Ileostomy takedown and port removal.  Pre op: 08/28/15 @ 10:30am--office.   Patient made aware to call [phone], between 1-3:00pm the day before surgery, to find out what time to arrive.

## 2015-08-19 NOTE — Telephone Encounter (Signed)
Spoke with Nurse Navigator, Mariea Clonts at this time who will speak with Dr. Mike Gip and find out if a port removal order can be given at this time. I will wait a response on the direction to proceed.

## 2015-08-21 NOTE — Telephone Encounter (Signed)
Called and gave permission per dr Mike Gip to remove port when they did reversal surgery.  Faxed order to d/c the port to [phone].

## 2015-08-25 ENCOUNTER — Ambulatory Visit
Admission: RE | Admit: 2015-08-25 | Discharge: 2015-08-25 | Disposition: A | Discharge status: Home / Self Care | Payer: 59 | Source: Ambulatory Visit | Attending: General Surgery | Admitting: General Surgery

## 2015-08-25 DIAGNOSIS — C20 Malignant neoplasm of rectum: Secondary | ICD-10-CM | POA: Insufficient documentation

## 2015-08-25 DIAGNOSIS — K5730 Diverticulosis of large intestine without perforation or abscess without bleeding: Secondary | ICD-10-CM | POA: Diagnosis not present

## 2015-08-25 NOTE — Addendum Note (Signed)
Addended by: Clayburn Pert T on: 08/25/2015 02:53 PM   Modules accepted: Orders

## 2015-08-28 ENCOUNTER — Ambulatory Visit
Admission: RE | Admit: 2015-08-28 | Discharge: 2015-08-28 | Disposition: A | Discharge status: Home / Self Care | Payer: 59 | Source: Ambulatory Visit | Attending: General Surgery | Admitting: General Surgery

## 2015-08-28 ENCOUNTER — Encounter
Admission: RE | Admit: 2015-08-28 | Discharge: 2015-08-28 | Disposition: A | Discharge status: Home / Self Care | Payer: 59 | Source: Ambulatory Visit | Attending: General Surgery | Admitting: General Surgery

## 2015-08-28 ENCOUNTER — Telehealth: Payer: Self-pay | Admitting: General Surgery

## 2015-08-28 ENCOUNTER — Telehealth: Payer: Self-pay

## 2015-08-28 ENCOUNTER — Ambulatory Visit: Admission: RE | Admit: 2015-08-28 | Payer: 59 | Source: Ambulatory Visit

## 2015-08-28 DIAGNOSIS — Z01818 Encounter for other preprocedural examination: Secondary | ICD-10-CM | POA: Diagnosis not present

## 2015-08-28 DIAGNOSIS — Z01810 Encounter for preprocedural cardiovascular examination: Secondary | ICD-10-CM | POA: Diagnosis not present

## 2015-08-28 HISTORY — DX: Chronic kidney disease, unspecified: N18.9

## 2015-08-28 LAB — CBC WITH DIFFERENTIAL/PLATELET
BASOS ABS: 0 10*3/uL (ref 0–0.1)
BASOS PCT: 1 %
EOS ABS: 0.2 10*3/uL (ref 0–0.7)
Eosinophils Relative: 5 %
HEMATOCRIT: 37.6 % — AB (ref 40.0–52.0)
Hemoglobin: 11.8 g/dL — ABNORMAL LOW (ref 13.0–18.0)
Lymphocytes Relative: 13 %
Lymphs Abs: 0.6 10*3/uL — ABNORMAL LOW (ref 1.0–3.6)
MCH: 24 pg — ABNORMAL LOW (ref 26.0–34.0)
MCHC: 31.4 g/dL — ABNORMAL LOW (ref 32.0–36.0)
MCV: 76.5 fL — ABNORMAL LOW (ref 80.0–100.0)
MONO ABS: 0.4 10*3/uL (ref 0.2–1.0)
Monocytes Relative: 8 %
NEUTROS ABS: 3.1 10*3/uL (ref 1.4–6.5)
NEUTROS PCT: 73 %
Platelets: 182 10*3/uL (ref 150–440)
RBC: 4.92 MIL/uL (ref 4.40–5.90)
RDW: 18.4 % — AB (ref 11.5–14.5)
WBC: 4.2 10*3/uL (ref 3.8–10.6)

## 2015-08-28 LAB — BASIC METABOLIC PANEL
ANION GAP: 5 (ref 5–15)
BUN: 21 mg/dL — ABNORMAL HIGH (ref 6–20)
CALCIUM: 9.4 mg/dL (ref 8.9–10.3)
CHLORIDE: 109 mmol/L (ref 101–111)
CO2: 26 mmol/L (ref 22–32)
CREATININE: 1.29 mg/dL — AB (ref 0.61–1.24)
GFR, EST NON AFRICAN AMERICAN: 58 mL/min — AB (ref >60)
Glucose, Bld: 89 mg/dL (ref 65–99)
POTASSIUM: 3.8 mmol/L (ref 3.5–5.1)
SODIUM: 140 mmol/L (ref 135–145)

## 2015-08-28 LAB — SURGICAL PCR SCREEN
MRSA, PCR: NEGATIVE
STAPHYLOCOCCUS AUREUS: NEGATIVE

## 2015-08-28 LAB — PROTIME-INR
INR: 1
PROTHROMBIN TIME: 13.4 s (ref 11.4–15.0)

## 2015-08-28 LAB — APTT: aPTT: 28 seconds (ref 24–36)

## 2015-08-28 NOTE — Telephone Encounter (Signed)
Baker Janus from pre-admit notified our office that patient was not told when to stop Eliquis and Aspirin. After looking into this matter, patient was placed on Eliquis after seeing Dr. Lucky Cowboy on 06/30/15 for vascular insufficiency. We will contact Dr. Lucky Cowboy at this time and fax clearance form to take patient off of medication prior to 09/08/15 Ileostomy takedown.  Clearance filled out and faxed at this time. [phone].

## 2015-08-28 NOTE — Patient Instructions (Signed)
  Your procedure is scheduled on: Sep 08, 2015 (Monday) Report to Day Surgery.([facility]) SECOND FLOOR To find out your arrival time please call [phone] between 1PM - 3PM on Sep 05, 2015 (Friday).  Remember: Instructions that are not followed completely may result in serious medical risk, up to and including death, or upon the discretion of your surgeon and anesthesiologist your surgery may need to be rescheduled.    __x__ 1. Do not eat food or drink liquids after midnight. No gum chewing or hard candies.     ____ 2. No Alcohol for 24 hours before or after surgery.   ____ 3. Bring all medications with you on the day of surgery if instructed.    __x_ 4. Notify your doctor if there is any change in your medical condition     (cold, fever, infections).     Do not wear jewelry, make-up, hairpins, clips or nail polish.  Do not wear lotions, powders, or perfumes. You may wear deodorant.  Do not shave 48 hours prior to surgery. Men may shave face and neck.  Do not bring valuables to the hospital.    [facility] is not responsible for any belongings or valuables.               Contacts, dentures or bridgework may not be worn into surgery.  Leave your suitcase in the car. After surgery it may be brought to your room.  For patients admitted to the hospital, discharge time is determined by your                treatment team.   Patients discharged the day of surgery will not be allowed to drive home.   Please read over the following fact sheets that you were given:   MRSA Information and Surgical Site Infection Prevention   __x__ Take these medicines the morning of surgery with A SIP OF WATER:    1. Clonidine  2.   3.   4.  5.  6.  ____ Fleet Enema (as directed)   _x___ Use CHG Soap as directed  ____ Use inhalers on the day of surgery  ____ Stop metformin 2 days prior to surgery    ____ Take 1/2 of usual insulin dose the night before surgery and none on the morning of  surgery.   __x__ Stop Coumadin/Plavix/aspirin on (DR. Adonis Huguenin OFFICE TO CONTACT PATIENT REGARDING STOPPING ASPIRIN AND ELIQUIS)  __x__ Stop Anti-inflammatories on (NO NSAIDS) TYLENOL OK TO TAKE FOR PAIN IF NEEDED   __x__ Stop supplements until after surgery.  ( STOP VITAMIN C AND B COMPLEX NOW)  ____ Bring C-Pap to the hospital.

## 2015-08-28 NOTE — Telephone Encounter (Signed)
Pre admit has called in reference to patient's medication (Eliquis, Asprin). I have advised her that [employer], has sent a request to Dr Clayborn Bigness in regards of the patient being off the medication prior to his surgery on 5/22. I have advised Baker Janus in Pre Admit that Luetta Nutting will call the patient with direction about his medications once she receives a response from the cardiologist.

## 2015-08-29 ENCOUNTER — Inpatient Hospital Stay: Payer: 59 | Attending: Hematology and Oncology

## 2015-08-29 MED ORDER — SODIUM CHLORIDE 0.9% FLUSH
10.0000 mL | INTRAVENOUS | Status: AC | PRN
  Filled 2015-08-29: qty 10

## 2015-08-29 MED ORDER — HEPARIN SOD (PORK) LOCK FLUSH 100 UNIT/ML IV SOLN: 500.0000 [IU] | Freq: Once | INTRAVENOUS | Status: AC

## 2015-08-29 NOTE — Telephone Encounter (Signed)
See other encounter notes

## 2015-09-01 NOTE — Telephone Encounter (Signed)
Left message for Mickel Baas (Nurse) regarding clearance. Awaiting return phone call at this time.

## 2015-09-02 NOTE — Telephone Encounter (Signed)
Called Pre-Admit to let them know that we received patient's clearance. They wanted me to fax it and I did. Patient is to have his surgery on 09/08/2015.

## 2015-09-05 ENCOUNTER — Other Ambulatory Visit: Payer: Self-pay

## 2015-09-05 NOTE — Patient Outreach (Signed)
[facility]) Care Management  09/05/2015  [name] HARIRI [name] 09/19/1955 IC:[id number]   Phone call for high cost referral assessment.  Member has hx of colon cancer and peripheral artery disease.  Member had several vascular procedures for his peripheral artery disease.   Member states he has been doing good.  States he is scheduled for surgery on Monday to reverse his colostomy.  States he thinks he will be in the hospital for 5-7 days.  States he has supportive family and he does not anticipate needing any extra help at this time.  States that he was told he had borderline diabetes in the past before he lost weight but he no longer has and does not take any medications for diabetes.  Denies any needs at this time. Instructed on [employer] and how to contact in the future if his needs change.   Member assessed with no further needs at this time but needs may change after surgery.  Plan to notify hospital liaison of upcoming planned admission. Peter Garter RN, [facility] Care Management Coordinator-Link to [place] Management [phone]

## 2015-09-07 MED ORDER — DEXTROSE 5 % IV SOLN
2.0000 g | INTRAVENOUS | Status: AC
  Administered 2015-09-08: 2 g via INTRAVENOUS
  Filled 2015-09-07: qty 2

## 2015-09-08 ENCOUNTER — Inpatient Hospital Stay
Admission: RE | Admit: 2015-09-08 | Discharge: 2015-09-11 | DRG: 331 | Disposition: A | Discharge status: Home / Self Care | Payer: 59 | Source: Ambulatory Visit | Attending: General Surgery | Admitting: General Surgery

## 2015-09-08 ENCOUNTER — Encounter: Payer: Self-pay | Admitting: *Deleted

## 2015-09-08 ENCOUNTER — Encounter
Admission: RE | Disposition: A | Discharge status: Home / Self Care | Payer: Self-pay | Source: Ambulatory Visit | Attending: General Surgery

## 2015-09-08 ENCOUNTER — Inpatient Hospital Stay: Payer: 59 | Admitting: Anesthesiology

## 2015-09-08 DIAGNOSIS — Z923 Personal history of irradiation: Secondary | ICD-10-CM | POA: Diagnosis not present

## 2015-09-08 DIAGNOSIS — Z87891 Personal history of nicotine dependence: Secondary | ICD-10-CM | POA: Diagnosis not present

## 2015-09-08 DIAGNOSIS — I252 Old myocardial infarction: Secondary | ICD-10-CM

## 2015-09-08 DIAGNOSIS — Z833 Family history of diabetes mellitus: Secondary | ICD-10-CM | POA: Diagnosis not present

## 2015-09-08 DIAGNOSIS — Z8249 Family history of ischemic heart disease and other diseases of the circulatory system: Secondary | ICD-10-CM

## 2015-09-08 DIAGNOSIS — I739 Peripheral vascular disease, unspecified: Secondary | ICD-10-CM | POA: Diagnosis present

## 2015-09-08 DIAGNOSIS — Z432 Encounter for attention to ileostomy: Secondary | ICD-10-CM | POA: Diagnosis not present

## 2015-09-08 DIAGNOSIS — Z433 Encounter for attention to colostomy: Secondary | ICD-10-CM

## 2015-09-08 DIAGNOSIS — C20 Malignant neoplasm of rectum: Principal | ICD-10-CM | POA: Diagnosis present

## 2015-09-08 DIAGNOSIS — Z85048 Personal history of other malignant neoplasm of rectum, rectosigmoid junction, and anus: Secondary | ICD-10-CM

## 2015-09-08 DIAGNOSIS — I2510 Atherosclerotic heart disease of native coronary artery without angina pectoris: Secondary | ICD-10-CM | POA: Diagnosis present

## 2015-09-08 DIAGNOSIS — I10 Essential (primary) hypertension: Secondary | ICD-10-CM | POA: Diagnosis present

## 2015-09-08 DIAGNOSIS — Z9889 Other specified postprocedural states: Secondary | ICD-10-CM

## 2015-09-08 DIAGNOSIS — K660 Peritoneal adhesions (postprocedural) (postinfection): Secondary | ICD-10-CM | POA: Diagnosis present

## 2015-09-08 DIAGNOSIS — D649 Anemia, unspecified: Secondary | ICD-10-CM | POA: Diagnosis not present

## 2015-09-08 DIAGNOSIS — Z955 Presence of coronary angioplasty implant and graft: Secondary | ICD-10-CM

## 2015-09-08 DIAGNOSIS — M109 Gout, unspecified: Secondary | ICD-10-CM | POA: Diagnosis present

## 2015-09-08 HISTORY — PX: PORT-A-CATH REMOVAL: SHX5289

## 2015-09-08 HISTORY — PX: ILEOSTOMY CLOSURE: SHX1784

## 2015-09-08 LAB — CBC
HEMATOCRIT: 37.6 % — AB (ref 40.0–52.0)
Hemoglobin: 11.8 g/dL — ABNORMAL LOW (ref 13.0–18.0)
MCH: 23.8 pg — ABNORMAL LOW (ref 26.0–34.0)
MCHC: 31.2 g/dL — ABNORMAL LOW (ref 32.0–36.0)
MCV: 76.1 fL — AB (ref 80.0–100.0)
PLATELETS: 167 10*3/uL (ref 150–440)
RBC: 4.95 MIL/uL (ref 4.40–5.90)
RDW: 18.9 % — ABNORMAL HIGH (ref 11.5–14.5)
WBC: 7.7 10*3/uL (ref 3.8–10.6)

## 2015-09-08 LAB — CREATININE, SERUM
Creatinine, Ser: 1.26 mg/dL — ABNORMAL HIGH (ref 0.61–1.24)
GFR, EST NON AFRICAN AMERICAN: 60 mL/min — AB (ref >60)

## 2015-09-08 SURGERY — CLOSURE, ILEOSTOMY
Anesthesia: General | Laterality: Right | Wound class: Contaminated

## 2015-09-08 MED ORDER — LIDOCAINE HCL (CARDIAC) 20 MG/ML IV SOLN
INTRAVENOUS | Status: DC | PRN
  Administered 2015-09-08: 60 mg via INTRAVENOUS

## 2015-09-08 MED ORDER — LACTATED RINGERS IV SOLN
INTRAVENOUS | Status: DC
Start: 2015-09-08 — End: 2015-09-11
  Administered 2015-09-08 – 2015-09-10 (×7): via INTRAVENOUS

## 2015-09-08 MED ORDER — CHLORHEXIDINE GLUCONATE 4 % EX LIQD: 1.0000 "application " | Freq: Once | CUTANEOUS | Status: DC

## 2015-09-08 MED ORDER — FENTANYL CITRATE (PF) 100 MCG/2ML IJ SOLN
25.0000 ug | INTRAMUSCULAR | Status: DC | PRN
  Administered 2015-09-08 (×4): 25 ug via INTRAVENOUS

## 2015-09-08 MED ORDER — CLONIDINE HCL 0.1 MG PO TABS
0.2000 mg | ORAL_TABLET | Freq: Every day | ORAL | Status: DC
  Administered 2015-09-09 – 2015-09-11 (×3): 0.2 mg via ORAL
  Filled 2015-09-08 (×3): qty 2

## 2015-09-08 MED ORDER — FAMOTIDINE 20 MG PO TABS
ORAL_TABLET | ORAL | Status: AC
  Filled 2015-09-08: qty 1

## 2015-09-08 MED ORDER — DIPHENHYDRAMINE HCL 12.5 MG/5ML PO ELIX
12.5000 mg | ORAL_SOLUTION | Freq: Four times a day (QID) | ORAL | Status: DC | PRN
  Filled 2015-09-08: qty 5

## 2015-09-08 MED ORDER — DIPHENHYDRAMINE HCL 50 MG/ML IJ SOLN: 12.5000 mg | Freq: Four times a day (QID) | INTRAMUSCULAR | Status: DC | PRN

## 2015-09-08 MED ORDER — ACETAMINOPHEN 10 MG/ML IV SOLN
INTRAVENOUS | Status: AC
  Filled 2015-09-08: qty 100

## 2015-09-08 MED ORDER — ACETAMINOPHEN 10 MG/ML IV SOLN
INTRAVENOUS | Status: DC | PRN
  Administered 2015-09-08: 1000 mg via INTRAVENOUS

## 2015-09-08 MED ORDER — HYDROCODONE-ACETAMINOPHEN 5-325 MG PO TABS
1.0000 | ORAL_TABLET | ORAL | Status: DC | PRN
  Administered 2015-09-08 – 2015-09-10 (×3): 2 via ORAL
  Administered 2015-09-10: 1 via ORAL
  Filled 2015-09-08: qty 2
  Filled 2015-09-08: qty 1
  Filled 2015-09-08 (×2): qty 2

## 2015-09-08 MED ORDER — MIDAZOLAM HCL 2 MG/2ML IJ SOLN
INTRAMUSCULAR | Status: DC | PRN
  Administered 2015-09-08: 2 mg via INTRAVENOUS

## 2015-09-08 MED ORDER — ONDANSETRON HCL 4 MG/2ML IJ SOLN: 4.0000 mg | Freq: Four times a day (QID) | INTRAMUSCULAR | Status: DC | PRN

## 2015-09-08 MED ORDER — BUPIVACAINE HCL (PF) 0.5 % IJ SOLN
INTRAMUSCULAR | Status: DC | PRN
  Administered 2015-09-08: 13 mL

## 2015-09-08 MED ORDER — ALUM & MAG HYDROXIDE-SIMETH 200-200-20 MG/5ML PO SUSP: 30.0000 mL | Freq: Four times a day (QID) | ORAL | Status: DC | PRN

## 2015-09-08 MED ORDER — ONDANSETRON HCL 4 MG/2ML IJ SOLN
INTRAMUSCULAR | Status: DC | PRN
  Administered 2015-09-08: 4 mg via INTRAVENOUS

## 2015-09-08 MED ORDER — FENTANYL CITRATE (PF) 100 MCG/2ML IJ SOLN
INTRAMUSCULAR | Status: DC | PRN
  Administered 2015-09-08: 25 ug via INTRAVENOUS
  Administered 2015-09-08 (×2): 50 ug via INTRAVENOUS

## 2015-09-08 MED ORDER — ONDANSETRON HCL 4 MG/2ML IJ SOLN: 4.0000 mg | Freq: Once | INTRAMUSCULAR | Status: DC | PRN

## 2015-09-08 MED ORDER — PROPOFOL 10 MG/ML IV BOLUS
INTRAVENOUS | Status: DC | PRN
  Administered 2015-09-08: 100 mg via INTRAVENOUS

## 2015-09-08 MED ORDER — FAMOTIDINE 20 MG PO TABS
20.0000 mg | ORAL_TABLET | Freq: Once | ORAL | Status: AC
  Administered 2015-09-08: 20 mg via ORAL

## 2015-09-08 MED ORDER — BUPIVACAINE HCL (PF) 0.5 % IJ SOLN
INTRAMUSCULAR | Status: AC
  Filled 2015-09-08: qty 30

## 2015-09-08 MED ORDER — ENOXAPARIN SODIUM 40 MG/0.4ML ~~LOC~~ SOLN
40.0000 mg | SUBCUTANEOUS | Status: DC
  Administered 2015-09-09 – 2015-09-11 (×3): 40 mg via SUBCUTANEOUS
  Filled 2015-09-08 (×3): qty 0.4

## 2015-09-08 MED ORDER — SUGAMMADEX SODIUM 200 MG/2ML IV SOLN
INTRAVENOUS | Status: DC | PRN
Start: 2015-09-08 — End: 2015-09-08
  Administered 2015-09-08: 197.8 mg via INTRAVENOUS

## 2015-09-08 MED ORDER — ROCURONIUM BROMIDE 100 MG/10ML IV SOLN
INTRAVENOUS | Status: DC | PRN
  Administered 2015-09-08: 50 mg via INTRAVENOUS
  Administered 2015-09-08: 10 mg via INTRAVENOUS

## 2015-09-08 MED ORDER — KETAMINE HCL 50 MG/ML IJ SOLN
INTRAMUSCULAR | Status: DC | PRN
  Administered 2015-09-08: 40 mg via INTRAMUSCULAR

## 2015-09-08 MED ORDER — FENTANYL CITRATE (PF) 100 MCG/2ML IJ SOLN
INTRAMUSCULAR | Status: AC
  Administered 2015-09-08: 25 ug via INTRAVENOUS
  Filled 2015-09-08: qty 2

## 2015-09-08 MED ORDER — LACTATED RINGERS IV SOLN
INTRAVENOUS | Status: DC
  Administered 2015-09-08 (×3): via INTRAVENOUS

## 2015-09-08 MED ORDER — MORPHINE SULFATE (PF) 4 MG/ML IV SOLN
4.0000 mg | INTRAVENOUS | Status: DC | PRN
  Administered 2015-09-08: 4 mg via INTRAVENOUS
  Filled 2015-09-08: qty 1

## 2015-09-08 MED ORDER — ONDANSETRON HCL 4 MG PO TABS: 4.0000 mg | ORAL_TABLET | Freq: Four times a day (QID) | ORAL | Status: DC | PRN

## 2015-09-08 MED ORDER — DEXAMETHASONE SODIUM PHOSPHATE 10 MG/ML IJ SOLN
INTRAMUSCULAR | Status: DC | PRN
  Administered 2015-09-08: 10 mg via INTRAVENOUS

## 2015-09-08 MED ORDER — COLCHICINE 0.6 MG PO TABS
0.6000 mg | ORAL_TABLET | Freq: Every day | ORAL | Status: DC
  Administered 2015-09-08 – 2015-09-11 (×4): 0.6 mg via ORAL
  Filled 2015-09-08 (×4): qty 1

## 2015-09-08 MED ORDER — EPHEDRINE SULFATE 50 MG/ML IJ SOLN
INTRAMUSCULAR | Status: DC | PRN
  Administered 2015-09-08: 10 mg via INTRAVENOUS

## 2015-09-08 SURGICAL SUPPLY — 53 items
BLADE SURG SZ11 CARB STEEL (BLADE) ×4 IMPLANT
CANISTER SUCT 1200ML W/VALVE (MISCELLANEOUS) ×4 IMPLANT
CATH FOL LEG HOLDER (MISCELLANEOUS) ×4 IMPLANT
CATH TRAY 16F METER LATEX (MISCELLANEOUS) ×4 IMPLANT
CHLORAPREP W/TINT 26ML (MISCELLANEOUS) ×4 IMPLANT
COVER LIGHT HANDLE STERIS (MISCELLANEOUS) ×8 IMPLANT
DRAPE LAPAROTOMY 100X77 ABD (DRAPES) ×4 IMPLANT
DRAPE LEGGINS SURG 28X43 STRL (DRAPES) IMPLANT
DRAPE TABLE BACK 80X90 (DRAPES) IMPLANT
DRAPE UNDER BUTTOCK W/FLU (DRAPES) IMPLANT
DRSG OPSITE POSTOP 4X14 (GAUZE/BANDAGES/DRESSINGS) ×4 IMPLANT
DRSG OPSITE POSTOP 4X6 (GAUZE/BANDAGES/DRESSINGS) ×4 IMPLANT
ELECT BLADE 6.5 EXT (BLADE) ×4 IMPLANT
ELECT CAUTERY BLADE 6.4 (BLADE) ×4 IMPLANT
ELECT REM PT RETURN 9FT ADLT (ELECTROSURGICAL) ×4
ELECTRODE REM PT RTRN 9FT ADLT (ELECTROSURGICAL) ×2 IMPLANT
GLOVE BIO SURGEON STRL SZ7.5 (GLOVE) ×12 IMPLANT
GLOVE INDICATOR 8.0 STRL GRN (GLOVE) ×12 IMPLANT
GOWN STRL REUS W/ TWL LRG LVL3 (GOWN DISPOSABLE) ×8 IMPLANT
GOWN STRL REUS W/ TWL XL LVL3 (GOWN DISPOSABLE) ×4 IMPLANT
GOWN STRL REUS W/TWL LRG LVL3 (GOWN DISPOSABLE) ×8
GOWN STRL REUS W/TWL XL LVL3 (GOWN DISPOSABLE) ×4
KIT RM TURNOVER STRD PROC AR (KITS) ×4 IMPLANT
LABEL OR SOLS (LABEL) ×4 IMPLANT
LIGASURE MARYLAND LAP STAND (ELECTROSURGICAL) IMPLANT
LIQUID BAND (GAUZE/BANDAGES/DRESSINGS) IMPLANT
NS IRRIG 1000ML POUR BTL (IV SOLUTION) ×4 IMPLANT
PACK BASIN MAJOR ARMC (MISCELLANEOUS) ×4 IMPLANT
PACK COLON CLEAN CLOSURE (MISCELLANEOUS) IMPLANT
PACK PORT-A-CATH (MISCELLANEOUS) ×4 IMPLANT
RELOAD LINEAR CUT PROX 55 BLUE (ENDOMECHANICALS) ×8 IMPLANT
RETAINER VISCERA MED (MISCELLANEOUS) IMPLANT
SOL PREP PVP 2OZ (MISCELLANEOUS) ×4
SOLUTION PREP PVP 2OZ (MISCELLANEOUS) ×2 IMPLANT
SPONGE LAP 18X18 5 PK (GAUZE/BANDAGES/DRESSINGS) ×4 IMPLANT
STAPLER PROXIMATE 55 BLUE (STAPLE) ×4 IMPLANT
STAPLER SKIN PROX 35W (STAPLE) ×4 IMPLANT
SUT ETHILON 3-0 KS 30 BLK (SUTURE) IMPLANT
SUT MNCRL 4-0 (SUTURE) ×2
SUT MNCRL 4-0 27XMFL (SUTURE) ×2
SUT NYLON 2-0 (SUTURE) ×4 IMPLANT
SUT PDS AB 1 TP1 96 (SUTURE) ×8 IMPLANT
SUT PROLENE 3 0 SH DA (SUTURE) ×4 IMPLANT
SUT SILK 2 0 SH (SUTURE) ×4 IMPLANT
SUT SILK 2 0SH CR/8 30 (SUTURE) IMPLANT
SUT SILK 3-0 (SUTURE) ×8 IMPLANT
SUT VIC AB 1 CTX 27 (SUTURE) ×16 IMPLANT
SUT VIC AB 3-0 SH 27 (SUTURE) ×4
SUT VIC AB 3-0 SH 27X BRD (SUTURE) ×4 IMPLANT
SUT VICRYL PLUS ABS 0 54 (SUTURE) ×4 IMPLANT
SUTURE MNCRL 4-0 27XMF (SUTURE) ×2 IMPLANT
SYR BULB IRRIG 60ML STRL (SYRINGE) ×4 IMPLANT
SYRINGE 10CC LL (SYRINGE) ×4 IMPLANT

## 2015-09-08 NOTE — H&P (View-Only) (Signed)
Outpatient Surgical Follow Up  08/15/2015  [name] Kehr [name] is an 60 y.o. male.   Chief Complaint  Patient presents with  . Follow-up    Discuss Colostomy takedown    HPI: 60 year old male well-known to the surgery service returns to clinic today to again discuss loop ileostomy takedown. Agent has a history of low rectal cancer and is status post neoadjuvant therapy, low anterior resection with anastomosis and loop ileostomy creation, adjuvant therapy with chemotherapy and radiation. Patient was previously evaluated for this in January after he completed his adjuvant therapy but was unable to pursue ostomy takedown secondary to new onset claudication. He has since completed multiple interventions with vascular therapy and is here again today to discuss takedown. Patient states his ostomy has been working fine and denies any fevers, chills, nausea, vomiting, diarrhea, chest pain, shortness of breath. He does continue to have limitations in mobility secondary to lower limb pain at this time.  Past Medical History  Diagnosis Date  . Rectal cancer ([place])   . Cancer ([place])   . Iron deficiency anemia   . Pulmonary nodules   . Hypertension   . Hyperlipidemia   . Hematuria   . Colon cancer ([place])   . Myocardial infarction ([place])   . Ureter, stricture   . Gout   . Atherosclerosis   . Peripheral vascular disease ([place])   . Coronary artery disease     Past Surgical History  Procedure Laterality Date  . Colonoscopy 06/06/14    . Colonoscopy with esophagogastroduodenoscopy (egd)    . Portacath placement  07/01/2014    Dr. Marina Gravel  . Coronary angioplasty with stent placement  03/2004  . Bowel resection N/A 10/23/2014    Procedure: LOW ANTERIOR BOWEL RESECTION;  Surgeon: Sherri Rad, MD;  Location: [facility];  Service: General;  Laterality: N/A;  . Laparotomy N/A 10/23/2014    Procedure: EXPLORATORY LAPAROTOMY;  Surgeon: Sherri Rad, MD;  Location: [facility];  Service: General;  Laterality: N/A;  .  Diverting ileostomy N/A 10/23/2014    Procedure: DIVERTING ILEOSTOMY;  Surgeon: Sherri Rad, MD;  Location: [facility];  Service: General;  Laterality: N/A;  . Cystoscopy with stent placement Bilateral 10/23/2014    Procedure: CYSTOSCOPY WITH STENT PLACEMENT,URETHRAL DILATION, LEFT RETROGRADE PYELOGRAM, URETEROSCOPY;  Surgeon: Hollice Espy, MD;  Location: [facility];  Service: Urology;  Laterality: Bilateral;  . Peripheral vascular catheterization N/A 05/12/2015    Procedure: Abdominal Aortogram w/Lower Extremity;  Surgeon: Algernon Huxley, MD;  Location: [place] [facility];  Service: Cardiovascular;  Laterality: N/A;  . Peripheral vascular catheterization  05/12/2015    Procedure: Lower Extremity Intervention;  Surgeon: Algernon Huxley, MD;  Location: [place] [facility];  Service: Cardiovascular;;  . Peripheral vascular catheterization N/A 06/30/2015    Procedure: Abdominal Aortogram w/Lower Extremity;  Surgeon: Algernon Huxley, MD;  Location: [place] [facility];  Service: Cardiovascular;  Laterality: N/A;  . Peripheral vascular catheterization  06/30/2015    Procedure: Lower Extremity Intervention;  Surgeon: Algernon Huxley, MD;  Location: [place] [facility];  Service: Cardiovascular;;    Family History  Problem Relation Age of Onset  . Hypertension Brother   . Hypertension Sister   . Hypertension Father   . Heart disease Paternal Grandfather   . Diabetes Father   . Diabetes Brother     Social History:  reports that he quit smoking about 30 years ago. He has never used smokeless tobacco. He reports that he does not drink  alcohol or use illicit drugs.  Allergies:  Allergies  Allergen Reactions  . No Known Allergies     Medications reviewed.    ROS A multipoint review of systems was completed. All pertinent positives and negatives are documented in the history of present illness and remainder are negative.   BP 151/85 mmHg  Pulse 62  Temp(Src) 98.4 F (36.9 C) (Oral)  Ht 5\' 10"  (1.778 m)  Wt  98.975 kg (218 lb 3.2 oz)  BMI 31.31 kg/m2  Physical Exam Gen.: No acute distress Neck: Supple and nontender Lymph adenopathy no evidence of cervical or clavicular lymphadenopathy Chest: Clear to auscultation, Chemo-Port in place Heart: Regular rate and rhythm Abdomen: Soft, nontender, nondistended. Well-healed midline incision. Loop ileostomy visible in the right lower quadrant that is pink, patent, productive of stool and gas. Extremities: Decreased mobility of the left lower extremity but moves bilateral extremities well. Neuro: Cranial nerves grossly intact Psych: Alert and oriented 3.    No results found for this or any previous visit (from the past 48 hour(s)). No results found.  Assessment/Plan:  1. Rectal cancer [name]) 60 year old male with a history of rectal cancer. The procedure of a loop ileostomy takedown and Chemo-Port removal were described in detail to the patient and his wife. The risks, benefits, alternatives were discussed and all questions were answered to their satisfaction. Discussed that prior to taking out his ileostomy he would require barium enema to confirm his anastomosis was intact without stricture or leak. Barring any unforeseen findings on the barium enema we will plan for a loop ileostomy takedown on May 22. We will obtain the okay from oncology remove the port prior to that time. - DG Colon W/Cm - Wo/W Kub; Future     Clayburn Pert, MD [facility] General Surgeon  08/15/2015,9:32 AM

## 2015-09-08 NOTE — Op Note (Signed)
Pre-operative Diagnosis: Ileostomy and history of rectal cancer  Post-operative Diagnosis: Same  Surgeon: Clayburn Pert   Assistants: None  Anesthesia: General endotracheal anesthesia  ASA Class: 2  Surgeon: Clayburn Pert, MD FACS  Anesthesia: Gen. with endotracheal tube  Assistant: None  Procedure Details  The patient was seen again in the Holding Room. The benefits, complications, treatment options, and expected outcomes were discussed with the patient. The risks of bleeding, infection, recurrence of symptoms, failure to resolve symptoms,  bowel injury, any of which could require further surgery were reviewed with the patient.   The patient was taken to Operating Room, identified as [name] HOUT [name] and the procedure verified.  A Time Out was held and the above information confirmed.  Prior to the induction of general anesthesia, antibiotic prophylaxis was administered. VTE prophylaxis was in place. General endotracheal anesthesia was then administered and tolerated well. After the induction, the right chest was prepped with Chloraprep and draped in the sterile fashion. The patient was positioned in the supine position.  The procedure began by removal of the patient's Chemo-Port. The previous incision was localized with local anesthetic and incised with a 15 blade scalpel. Using Bovie much cautery was taken down until the port was able to visualize. Therefore was able to easily removed from the cavity. A U stitch with a 2-0 Vicryl suture was used around the catheters tract entering into the port cavity. The port was easily removed in one motion and the suture tied down obliterating the previous tract. The cavity was closed using interrupted 3-0 Vicryl. The skin was then closed with a subcuticular 4-0 Monocryl suture and Mastisol and Steri-Strips. A sterile dressing of Telfa and Tegaderm was placed over this without difficulty or immediate complications.  The patient was then  uncovered from the previously placed drapes and his abdomen was prepped with Betadine in the standard sterile fashion due to the ileostomy present. Ileostomy was freed up circumferentially by cutting the skin at the ostomy site with a 15 blade scalpel and using Bovie electrocautery. The superficial attachments were easily taken down with care being made to protect the mucosa of the ileum. There were dense adhesions to the fascia both medially and laterally that I be taken down sharply with Metzenbaum scissors. Once the ileostomy was circumvented freed it was able to be further eviscerated and the mucosa fully evaluated. The previous ileostomy length was noted to be traumatized from the takedown and the decision was made to perform the anastomosis proximal to the prior ileostomy mucosa. Enterotomies were made on both the proximal and distal ileum free from the prior ileostomy site. Using 2 loads of 55 mm blue load GIA stapler a patulous anastomosis was easily created. The previous ileostomy mucosa was excised using an additional load of the GIA stapler. The created mesenteric defect was made hemostatic using combination of electrocautery and 3-0 silk suture. The defect was then closed with a running 3-0 silk suture.  The reanastomosed ileum was then returned to the patient's abdomen and the fascia was closed with a running #1 DDS suture. These superficial tissue was copiously irrigated with normal saline. The prior ileostomy site was closed with staples over a Penrose drain. Patient tolerated procedure well without immediate complications. All counts are correct at the end of the procedure. He was woken from general endotracheal anesthesia without any immediate complications.  Findings: Loop ileostomy and right subclavian Chemo-Port   Estimated Blood Loss: 50 mL  Drains: Penrose drain and superficial tissues         Specimens: Ileostomy          Complications: None                  Condition:  Good   Clayburn Pert, MD, FACS

## 2015-09-08 NOTE — Anesthesia Procedure Notes (Signed)
Procedure Name: Intubation Performed by: Jenetta Downer Pre-anesthesia Checklist: Patient identified, Emergency Drugs available, Suction available and Patient being monitored Patient Re-evaluated:Patient Re-evaluated prior to inductionOxygen Delivery Method: Circle system utilized Preoxygenation: Pre-oxygenation with 100% oxygen Intubation Type: IV induction Ventilation: Mask ventilation without difficulty Laryngoscope Size: 2 Grade View: Grade I Tube type: Oral Tube size: 7.5 mm Number of attempts: 1 Airway Equipment and Method: Stylet Placement Confirmation: ETT inserted through vocal cords under direct vision,  positive ETCO2 and breath sounds checked- equal and bilateral Secured at: 21 cm

## 2015-09-08 NOTE — Transfer of Care (Signed)
Immediate Anesthesia Transfer of Care Note  Patient: [name] UPADHYAY [name]  Procedure(s) Performed: Procedure(s): ILEOSTOMY TAKEDOWN (N/A) REMOVAL PORT-A-CATH (Right)  Patient Location: PACU  Anesthesia Type:General  Level of Consciousness: awake, alert  and oriented  Airway & Oxygen Therapy: Patient Spontanous Breathing and Patient connected to face mask oxygen  Post-op Assessment: Report given to RN and Post -op Vital signs reviewed and stable  Post vital signs: Reviewed and stable  Last Vitals:  Filed Vitals:   09/08/15 0620 09/08/15 0956  BP: 139/76 141/78  Pulse: 69 58  Temp: 36.9 C 36.3 C  Resp: 18 18    Last Pain:  Filed Vitals:   09/08/15 0959  PainSc: Asleep         Complications: No apparent anesthesia complications

## 2015-09-08 NOTE — Brief Op Note (Signed)
09/08/2015  9:45 AM  PATIENT:  [name] Kehr [name]  60 y.o. male  PRE-OPERATIVE DIAGNOSIS:  RECTAL CANCER  POST-OPERATIVE DIAGNOSIS:  RECTAL CANCER  PROCEDURE:  Procedure(s): ILEOSTOMY TAKEDOWN (N/A) REMOVAL PORT-A-CATH (Right)  SURGEON:  Surgeon(s) and Role:    * Clayburn Pert, MD - Primary  PHYSICIAN ASSISTANT:   ASSISTANTS: none   ANESTHESIA:   general  EBL:  Total I/O In: -  Out: 275 [Urine:250; Blood:25]  BLOOD ADMINISTERED:none  DRAINS: Penrose drain in the superficial former ostomy site   LOCAL MEDICATIONS USED:  MARCAINE   , XYLOCAINE  and Amount: 13 ml  SPECIMEN:  Source of Specimen:  ileostomy site  DISPOSITION OF SPECIMEN:  PATHOLOGY  COUNTS:  YES  TOURNIQUET:  * No tourniquets in log *  DICTATION: .Dragon Dictation  PLAN OF CARE: Admit to inpatient   PATIENT DISPOSITION:  PACU - hemodynamically stable.   Delay start of Pharmacological VTE agent (>24hrs) due to surgical blood loss or risk of bleeding: no

## 2015-09-08 NOTE — Anesthesia Preprocedure Evaluation (Signed)
Anesthesia Evaluation  Patient identified by MRN, date of birth, ID band Patient awake    Reviewed: Allergy & Precautions, NPO status   Airway Mallampati: II       Dental  (+) Upper Dentures, Lower Dentures   Pulmonary former smoker,    breath sounds clear to auscultation       Cardiovascular Exercise Tolerance: Poor hypertension, Pt. on medications + CAD and + Past MI   Rhythm:Regular Rate:Normal     Neuro/Psych Anxiety negative neurological ROS     GI/Hepatic negative GI ROS, Neg liver ROS,   Endo/Other  diabetes  Renal/GU      Musculoskeletal negative musculoskeletal ROS (+)   Abdominal (+) + obese,   Peds  Hematology  (+) anemia ,   Anesthesia Other Findings   Reproductive/Obstetrics                             Anesthesia Physical Anesthesia Plan  ASA: III  Anesthesia Plan: General   Post-op Pain Management:    Induction: Intravenous  Airway Management Planned: Oral ETT  Additional Equipment:   Intra-op Plan:   Post-operative Plan: Extubation in OR  Informed Consent: I have reviewed the patients History and Physical, chart, labs and discussed the procedure including the risks, benefits and alternatives for the proposed anesthesia with the patient or authorized representative who has indicated his/her understanding and acceptance.     Plan Discussed with: CRNA  Anesthesia Plan Comments:         Anesthesia Quick Evaluation

## 2015-09-08 NOTE — Interval H&P Note (Signed)
History and Physical Interval Note:  09/08/2015 6:49 AM  [name] Kehr [name]  has presented today for surgery, with the diagnosis of RECTAL CANCER  The various methods of treatment have been discussed with the patient and family. After consideration of risks, benefits and other options for treatment, the patient has consented to  Procedure(s): ILEOSTOMY TAKEDOWN (N/A) REMOVAL PORT-A-CATH (N/A) as a surgical intervention .  The patient's history has been reviewed, patient examined, no change in status, stable for surgery.  I have reviewed the patient's chart and labs.  Questions were answered to the patient's satisfaction.     Clayburn Pert

## 2015-09-08 NOTE — Anesthesia Postprocedure Evaluation (Signed)
Anesthesia Post Note  Patient: [name] ZIRKLE [name]  Procedure(s) Performed: Procedure(s) (LRB): ILEOSTOMY TAKEDOWN (N/A) REMOVAL PORT-A-CATH (Right)  Patient location during evaluation: PACU Anesthesia Type: General Level of consciousness: awake Pain management: pain level controlled Vital Signs Assessment: post-procedure vital signs reviewed and stable Respiratory status: spontaneous breathing Cardiovascular status: blood pressure returned to baseline Anesthetic complications: no    Last Vitals:  Filed Vitals:   09/08/15 0620 09/08/15 0956  BP: 139/76 141/78  Pulse: 69 58  Temp: 36.9 C 36.3 C  Resp: 18 18    Last Pain:  Filed Vitals:   09/08/15 0959  PainSc: Asleep                 VAN STAVEREN,Eila Runyan

## 2015-09-09 LAB — BASIC METABOLIC PANEL
ANION GAP: 8 (ref 5–15)
BUN: 23 mg/dL — ABNORMAL HIGH (ref 6–20)
CALCIUM: 9.1 mg/dL (ref 8.9–10.3)
CO2: 22 mmol/L (ref 22–32)
CREATININE: 1.18 mg/dL (ref 0.61–1.24)
Chloride: 107 mmol/L (ref 101–111)
GFR calc Af Amer: >60 mL/min (ref >60)
Glucose, Bld: 100 mg/dL — ABNORMAL HIGH (ref 65–99)
Potassium: 4.2 mmol/L (ref 3.5–5.1)
SODIUM: 137 mmol/L (ref 135–145)

## 2015-09-09 LAB — SURGICAL PATHOLOGY

## 2015-09-09 LAB — CBC
HCT: 34.4 % — ABNORMAL LOW (ref 40.0–52.0)
HEMOGLOBIN: 11 g/dL — AB (ref 13.0–18.0)
MCH: 24.2 pg — ABNORMAL LOW (ref 26.0–34.0)
MCHC: 31.9 g/dL — ABNORMAL LOW (ref 32.0–36.0)
MCV: 75.8 fL — ABNORMAL LOW (ref 80.0–100.0)
PLATELETS: 182 10*3/uL (ref 150–440)
RBC: 4.54 MIL/uL (ref 4.40–5.90)
RDW: 18.5 % — ABNORMAL HIGH (ref 11.5–14.5)
WBC: 7.9 10*3/uL (ref 3.8–10.6)

## 2015-09-09 NOTE — Consult Note (Signed)
   [facility] CM Inpatient Consult   09/09/2015  [name] CHAIT [name] 1955-10-09 [id number]  Received an in-basket message from [facility] in office Care Manager that this patient may have needs after surgery for [place] Management services. Met with patient at the bedside to discuss [place]. Patient did not feel he was in need of the services at this time but wanted to talk it over with his wife. Patient given information pamphlet and contact information. Made patient aware [place] Management services do not take the place of any services set up by the inpatient care management team. For any questions or concerns please contact:  Cedrick Partain RN, [place] [facility] Liaison  [phone]) Business Mobile [phone]) Toll free office

## 2015-09-09 NOTE — Progress Notes (Signed)
1 Day Post-Op   Subjective:  Postop day 1 from loop ileostomy reversal and chemotherapy port removal. Had an uneventful night. Dressing had to be reinforced secondary to using. Patient only remarks some soreness at his surgical site. He has been tolerating his clear liquid diet denies any bowel function yet. Also denies any nausea or vomiting.  Vital signs in last 24 hours: Temp:  [96.9 F (36.1 C)-99.8 F (37.7 C)] 98.7 F (37.1 C) (05/23 0609) Pulse Rate:  [53-65] 54 (05/23 0609) Resp:  [12-20] 16 (05/23 0609) BP: (127-156)/(49-85) 156/85 mmHg (05/23 0609) SpO2:  [57 %-100 %] 98 % (05/23 0609) Last BM Date: 09/08/15  Intake/Output from previous day: 05/22 0701 - 05/23 0700 In: 2535 [P.O.:90; I.V.:2445] Out: 275 [Urine:250; Blood:25]  Physical exam: Gen.: No acute distress Chest: Previous port site with dressing in place without evidence of erythema or drainage, clear to auscultation Heart: Regular rate and rhythm GI: Abdomen is soft, appropriately tender to palpation at the incision site, nondistended. Staples in place over Penrose drain at prior ostomy site. Minimal sanguinous output on dressing no evidence of purulence or spreading erythema.  Lab Results:  CBC  Recent Labs  09/08/15 1343 09/09/15 0338  WBC 7.7 7.9  HGB 11.8* 11.0*  HCT 37.6* 34.4*  PLT 167 182   CMP     Component Value Date/Time   NA 137 09/09/2015 0338   NA 132* 08/12/2014 0858   K 4.2 09/09/2015 0338   K 4.0 08/12/2014 0858   CL 107 09/09/2015 0338   CL 100* 08/12/2014 0858   CO2 22 09/09/2015 0338   CO2 24 08/12/2014 0858   GLUCOSE 100* 09/09/2015 0338   GLUCOSE 118* 08/12/2014 0858   BUN 23* 09/09/2015 0338   BUN 30* 08/12/2014 0858   CREATININE 1.18 09/09/2015 0338   CREATININE 1.52* 08/12/2014 0858   CALCIUM 9.1 09/09/2015 0338   CALCIUM 8.9 08/12/2014 0858   PROT 6.9 06/23/2015 1503   PROT 7.1 08/12/2014 0858   ALBUMIN 3.9 06/23/2015 1503   ALBUMIN 3.7 08/12/2014 0858   AST 19  06/23/2015 1503   AST 16 08/12/2014 0858   ALT 21 06/23/2015 1503   ALT 15* 08/12/2014 0858   ALKPHOS 88 06/23/2015 1503   ALKPHOS 69 08/12/2014 0858   BILITOT 0.3 06/23/2015 1503   BILITOT 0.6 08/12/2014 0858   GFRNONAA >60 09/09/2015 0338   GFRNONAA 49* 08/12/2014 0858   GFRNONAA 37* 02/01/2014 1359   GFRAA >60 09/09/2015 0338   GFRAA 57* 08/12/2014 0858   GFRAA 45* 02/01/2014 1359   PT/INR No results for input(s): LABPROT, INR in the last 72 hours.  Studies/Results: No results found.  Assessment/Plan: 60 year old male status post port removal and loop ileostomy reversal. Tolerating clear liquid diet. We will advance this as tolerated. Still awaiting return of bowel function. Encourage ambulation and incentive spirometry usage.   Clayburn Pert, MD FACS General Surgeon  09/09/2015

## 2015-09-09 NOTE — Progress Notes (Signed)
Physical Therapy Evaluation Patient Details Name: [name] [name]. MRN: [id number] DOB: 03-02-1956 Today's Date: 09/09/2015   History of Present Illness  60 year old male POD #1 loop ileostomy takedown and port removal.  Clinical Impression  Pt presents to PT this date with below baseline functional mobility with ambulation.  Pt was using RW prior to admission due to LE pain and decreased vascularization and was independent at home with ADL's.  Today, pt able to get up out of bed and don pants with supervision.  Pt ambulates 50' x3 with RW and Supervision.  Pt required stand by assist for toileting and able to transfer on/off commode with Mod I.  Pt educated on importance of ambulation after abdominal surgery and for promoting bowel function and to call for assist before getting up from chair.  Pt would benefit from 1-2 more visits for additional gait training and stairs.    Follow Up Recommendations No PT follow up    Equipment Recommendations  Rolling walker with 5" wheels (has own)    Recommendations for Other Services       Precautions / Restrictions Precautions Precautions: None Restrictions Weight Bearing Restrictions: No      Mobility  Bed Mobility Overal bed mobility: Modified Independent             General bed mobility comments: HOB elevated and rails, extra time  Transfers Overall transfer level: Modified independent Equipment used: Rolling walker (2 wheeled)             General transfer comment: Able to transfer sit<>stand from bed and commode  Ambulation/Gait Ambulation/Gait assistance: Supervision Ambulation Distance (Feet): 150 Feet Assistive device: Rolling walker (2 wheeled) Gait Pattern/deviations: Step-to pattern     General Gait Details: slow but steady gait with heavy lean on RW; able to increase gait speed with cues and use step through gait pattern  Stairs            Wheelchair Mobility    Modified Rankin (Stroke Patients  Only)       Balance Overall balance assessment: Modified Independent                                           Pertinent Vitals/Pain Pain Assessment: No/denies pain    Home Living Family/patient expects to be discharged to:: Private residence Living Arrangements: Spouse/significant other Available Help at Discharge: Family Type of Home: House Home Access: Stairs to enter Entrance Stairs-Rails: Can reach both Entrance Stairs-Number of Steps: 4 Home Layout: One level Home Equipment: Cane - single point;Walker - 2 wheels Additional Comments: able to use RW and steps to enter home    Prior Function Level of Independence: Independent with assistive device(s)         Comments: uses RW for limited distances due to LE pain/ vascular deficiency     Hand Dominance        Extremity/Trunk Assessment   Upper Extremity Assessment: Overall WFL for tasks assessed           Lower Extremity Assessment: Overall WFL for tasks assessed         Communication   Communication: No difficulties  Cognition Arousal/Alertness: Awake/alert Behavior During Therapy: WFL for tasks assessed/performed Overall Cognitive Status: Within Functional Limits for tasks assessed  General Comments      Exercises Other Exercises Other Exercises: amulation 83' x3      Assessment/Plan    PT Assessment Patient needs continued PT services  PT Diagnosis Difficulty walking   PT Problem List Decreased strength;Decreased activity tolerance;Decreased mobility  PT Treatment Interventions Gait training;Stair training;Functional mobility training;Therapeutic activities;Therapeutic exercise   PT Goals (Current goals can be found in the Care Plan section) Acute Rehab PT Goals Patient Stated Goal: To go home PT Goal Formulation: With patient Time For Goal Achievement: 09/16/15 Potential to Achieve Goals: Good    Frequency Min 2X/week   Barriers to  discharge        Co-evaluation               End of Session   Activity Tolerance: Patient tolerated treatment well Patient left: in chair;with call bell/phone within reach Nurse Communication: Mobility status         Time: 1130-1159 PT Time Calculation (min) (ACUTE ONLY): 29 min   Charges:   PT Evaluation $PT Eval Low Complexity: 1 Procedure PT Treatments $Therapeutic Exercise: 8-22 mins   PT G Codes:        Parish Augustine A Taquana Bartley, PT 09/09/2015, 12:49 PM

## 2015-09-10 NOTE — Progress Notes (Signed)
2 Days Post-Op   Subjective:  Patient did well overnight. Tolerating his diet without nausea or vomiting. Has been passing flatus and had a small liquid bowel movement. His pain has been well-controlled.  Vital signs in last 24 hours: Temp:  [97.8 F (36.6 C)-98.9 F (37.2 C)] 98.9 F (37.2 C) (05/24 0521) Pulse Rate:  [55-61] 61 (05/24 0521) Resp:  [15-20] 20 (05/24 0521) BP: (142-161)/(68-87) 161/87 mmHg (05/24 0521) SpO2:  [96 %-99 %] 96 % (05/24 0521) Last BM Date: 09/09/15  Intake/Output from previous day: 05/23 0701 - 05/24 0700 In: 3506 [P.O.:990; I.V.:2516] Out: 300 [Urine:300]  GI: Abdomen is soft, purple tender to palpation at the incision site, nondistended. Staples and a Penrose drain in place draining a serosanguineous fluid. Active bowel sounds.  Lab Results:  CBC  Recent Labs  09/08/15 1343 09/09/15 0338  WBC 7.7 7.9  HGB 11.8* 11.0*  HCT 37.6* 34.4*  PLT 167 182   CMP     Component Value Date/Time   NA 137 09/09/2015 0338   NA 132* 08/12/2014 0858   K 4.2 09/09/2015 0338   K 4.0 08/12/2014 0858   CL 107 09/09/2015 0338   CL 100* 08/12/2014 0858   CO2 22 09/09/2015 0338   CO2 24 08/12/2014 0858   GLUCOSE 100* 09/09/2015 0338   GLUCOSE 118* 08/12/2014 0858   BUN 23* 09/09/2015 0338   BUN 30* 08/12/2014 0858   CREATININE 1.18 09/09/2015 0338   CREATININE 1.52* 08/12/2014 0858   CALCIUM 9.1 09/09/2015 0338   CALCIUM 8.9 08/12/2014 0858   PROT 6.9 06/23/2015 1503   PROT 7.1 08/12/2014 0858   ALBUMIN 3.9 06/23/2015 1503   ALBUMIN 3.7 08/12/2014 0858   AST 19 06/23/2015 1503   AST 16 08/12/2014 0858   ALT 21 06/23/2015 1503   ALT 15* 08/12/2014 0858   ALKPHOS 88 06/23/2015 1503   ALKPHOS 69 08/12/2014 0858   BILITOT 0.3 06/23/2015 1503   BILITOT 0.6 08/12/2014 0858   GFRNONAA >60 09/09/2015 0338   GFRNONAA 49* 08/12/2014 0858   GFRNONAA 37* 02/01/2014 1359   GFRAA >60 09/09/2015 0338   GFRAA 57* 08/12/2014 0858   GFRAA 45* 02/01/2014  1359   PT/INR No results for input(s): LABPROT, INR in the last 72 hours.  Studies/Results: No results found.  Assessment/Plan: 60 year old male status post loop ileostomy revision and chemoport removal. Doing well. Discussed with patient that if he continues to do's this well that he will likely be ready for discharge home in the morning. Encourage ambulation, incentive spirometer usage, oral intake.   Clayburn Pert, MD FACS General Surgeon  09/10/2015

## 2015-09-10 NOTE — Care Management (Signed)
No discharge needs identified by care team at present time.

## 2015-09-11 ENCOUNTER — Other Ambulatory Visit (HOSPITAL_COMMUNITY): Payer: 59

## 2015-09-11 MED ORDER — HYDROCODONE-ACETAMINOPHEN 5-325 MG PO TABS: 1.0000 | ORAL_TABLET | ORAL | Status: DC | PRN

## 2015-09-11 NOTE — Discharge Instructions (Signed)
Loop Ileostomy Reversal, Care After Refer to this sheet in the next few weeks. These instructions provide you with information on caring for yourself after your procedure. Your caregiver may also give you more specific instructions. Your treatment has been planned according to current medical practices, but problems sometimes occur. Call your caregiver if you have any problems or questions after your procedure. HOME CARE INSTRUCTIONS  Rest.  Take pain medicine or other medicines as directed.  Gradually return to daily activities. Gradually increase your exercise each day. Walking is a good option.  Avoid strenuous activity, such as heavy lifting, for 6 weeks.  Follow your caregiver's instructions to protect any surgical cuts (incisions). Clean the area gently with warm water and soap every day as directed.  Resume a normal diet. You may not have much of an appetite for several weeks after surgery. Try eating small meals and choose foods that appeal to you.  You may need to limit foods temporarily that are difficult to digest, such as nuts, coconut, mushrooms, raw fruits, and raw vegetables.  Follow up with your caregiver as directed.  You may return to work when you feel able, depending upon your job duties.  Always discuss your medicines with your caregiver before taking them.  If you smoke, quit. Smoking slows the healing process. SEEK MEDICAL CARE IF:  You develop chills.  You feel nauseous.  You vomit.  You notice fluid drainage, bulging, redness, or pain around your incisions.  You have frequent diarrhea.  You experience shortness of breath.  You have other new symptoms.  You have questions or concerns. SEEK IMMEDIATE MEDICAL CARE IF:  You feel dizzy, lightheaded, or faint.  You measure pouch drainage of more than 1,500 ml per day. This amount of drainage can lead to dehydration.  Your abdominal pain does not go away or becomes severe.  You have a fever.  You  keep vomiting.  You have an irregular heartbeat or chest pain. MAKE SURE YOU:  Understand these instructions.  Will watch your condition.  Will get help right away if you are not doing well or get worse.   This information is not intended to replace advice given to you by your health care provider. Make sure you discuss any questions you have with your health care provider.   Document Released: 03/25/2011 Document Revised: 04/26/2014 Document Reviewed: 03/25/2011 Elsevier Interactive Patient Education [employer].

## 2015-09-11 NOTE — Discharge Summary (Signed)
Patient ID: [name] Kehr [name] MRN: [id number] DOB/AGE: Jan 05, 1956 60 y.o.  Admit date: 09/08/2015 Discharge date: 09/11/2015  Discharge Diagnoses:  History of colorectal cancer  Procedures Performed: Chemoport removal and loop ileostomy takedown  Discharged Condition: good  Hospital Course: Taken to the operating room for a scheduled removal of port and ileostomy takedown. Patient tolerated procedure well was able to ambulate, tolerated diet and had bowel function prior to discharge.  Discharge Orders:  home  Disposition: 01-Home or Self Care  Discharge Medications:   Medication List    TAKE these medications        apixaban 5 MG Tabs tablet  Commonly known as:  ELIQUIS  Take 1 tablet (5 mg total) by mouth 2 (two) times daily.     aspirin EC 81 MG tablet  Take 81 mg by mouth daily.     B-complex with vitamin C tablet  Take 1 tablet by mouth daily. Reported on 06/23/2015     cloNIDine 0.2 MG tablet  Commonly known as:  CATAPRES  Take 0.2 mg by mouth daily.     colchicine 0.6 MG tablet  Take 0.6 mg by mouth daily.     ferrous sulfate 325 (65 FE) MG tablet  Take 325 mg by mouth daily with breakfast.     HYDROcodone-acetaminophen 5-325 MG tablet  Commonly known as:  NORCO/VICODIN  Take 1-2 tablets by mouth every 4 (four) hours as needed for moderate pain.     vitamin C 1000 MG tablet  Take 1,000 mg by mouth daily.         Follwup: Follow-up Information    Follow up with [facility] In 1 week.   Why:  postop   Contact information:   [address] [place] [id number]      Signed: Clayburn Pert 09/11/2015, 7:02 AM

## 2015-09-11 NOTE — Progress Notes (Signed)
Patient discharged home as ordered. IV discontinued site clean and dry, no infection noted. Dressing to abdomen clean dry and intact. Follow up appointments given as ordered. Patients wife at the bedside to take patient home. Patient taken out via wheel chair to awaiting car.

## 2015-09-18 ENCOUNTER — Encounter: Payer: Self-pay | Admitting: General Surgery

## 2015-09-18 ENCOUNTER — Ambulatory Visit (INDEPENDENT_AMBULATORY_CARE_PROVIDER_SITE_OTHER): Payer: 59 | Admitting: General Surgery

## 2015-09-18 VITALS — BP 169/88 | HR 54 | Temp 98.4°F | Ht 70.0 in | Wt 224.2 lb

## 2015-09-18 DIAGNOSIS — Z4889 Encounter for other specified surgical aftercare: Secondary | ICD-10-CM

## 2015-09-18 NOTE — Patient Instructions (Signed)
Please see your appointment listed below.  Please call our office if you have any questions or concerns.  

## 2015-09-18 NOTE — Progress Notes (Signed)
Outpatient Surgical Follow Up  09/18/2015  [name] Kehr [name] is an 60 y.o. male.   Chief Complaint  Patient presents with  . Routine Post Op    Loop Ileostomy / Right subclavin Chemo-port    HPI: 60 year old male returns to clinic 10 days status post loop ileostomy takedown and chemotherapy port removal. Patient reports occasional gas cramps after eating but denies any actual pain. He is eating well and having regular bowel function. He denies any fevers, chills, nausea, vomiting, chest pain, shortness of breath. He still has a Penrose drain in place at his ileostomy site that is continuing to drain a small amount of fluid.  Past Medical History  Diagnosis Date  . Rectal cancer ([place])   . Cancer ([place])   . Iron deficiency anemia   . Pulmonary nodules   . Hypertension   . Hyperlipidemia   . Hematuria   . Colon cancer ([place])   . Ureter, stricture   . Gout   . Atherosclerosis   . Peripheral vascular disease ([place])   . Coronary artery disease   . Myocardial infarction ([place]) 2005  . Chronic kidney disease     kidney stones, UTI    Past Surgical History  Procedure Laterality Date  . Colonoscopy 06/06/14    . Colonoscopy with esophagogastroduodenoscopy (egd)    . Portacath placement  07/01/2014    Dr. Marina Gravel  . Coronary angioplasty with stent placement  03/2004  . Bowel resection N/A 10/23/2014    Procedure: LOW ANTERIOR BOWEL RESECTION;  Surgeon: Sherri Rad, MD;  Location: [facility];  Service: General;  Laterality: N/A;  . Laparotomy N/A 10/23/2014    Procedure: EXPLORATORY LAPAROTOMY;  Surgeon: Sherri Rad, MD;  Location: [facility];  Service: General;  Laterality: N/A;  . Diverting ileostomy N/A 10/23/2014    Procedure: DIVERTING ILEOSTOMY;  Surgeon: Sherri Rad, MD;  Location: [facility];  Service: General;  Laterality: N/A;  . Cystoscopy with stent placement Bilateral 10/23/2014    Procedure: CYSTOSCOPY WITH STENT PLACEMENT,URETHRAL DILATION, LEFT RETROGRADE PYELOGRAM, URETEROSCOPY;  Surgeon:  Hollice Espy, MD;  Location: [facility];  Service: Urology;  Laterality: Bilateral;  . Peripheral vascular catheterization N/A 05/12/2015    Procedure: Abdominal Aortogram w/Lower Extremity;  Surgeon: Algernon Huxley, MD;  Location: [place] [facility];  Service: Cardiovascular;  Laterality: N/A;  . Peripheral vascular catheterization  05/12/2015    Procedure: Lower Extremity Intervention;  Surgeon: Algernon Huxley, MD;  Location: [place] [facility];  Service: Cardiovascular;;  . Peripheral vascular catheterization N/A 06/30/2015    Procedure: Abdominal Aortogram w/Lower Extremity;  Surgeon: Algernon Huxley, MD;  Location: [place] [facility];  Service: Cardiovascular;  Laterality: N/A;  . Peripheral vascular catheterization  06/30/2015    Procedure: Lower Extremity Intervention;  Surgeon: Algernon Huxley, MD;  Location: [place] [facility];  Service: Cardiovascular;;  . Colon surgery    . Ileostomy closure N/A 09/08/2015    Procedure: ILEOSTOMY TAKEDOWN;  Surgeon: Clayburn Pert, MD;  Location: [facility];  Service: General;  Laterality: N/A;  . Port-a-cath removal Right 09/08/2015    Procedure: REMOVAL PORT-A-CATH;  Surgeon: Clayburn Pert, MD;  Location: [facility];  Service: General;  Laterality: Right;    Family History  Problem Relation Age of Onset  . Hypertension Brother   . Hypertension Father   . Diabetes Father   . Heart disease Paternal Grandfather   . Diabetes Brother   . Hypertension Brother     Social History:  reports that he quit smoking about 30 years ago. His smoking use included Cigarettes. He has never used smokeless tobacco. He reports that he does not drink alcohol or use illicit drugs.  Allergies:  Allergies  Allergen Reactions  . No Known Allergies     Medications reviewed.    ROS A multipoint review of systems was completed, all pertinent positives and negatives are documented within the history of present illness and remainder are negative   BP 169/88 mmHg  Pulse 54   Temp(Src) 98.4 F (36.9 C) (Oral)  Ht 5\' 10"  (1.778 m)  Wt 101.696 kg (224 lb 3.2 oz)  BMI 32.17 kg/m2  Physical Exam Gen.: No acute distress Chest: Clear to auscultation, prior chemotherapy port site well approximated without evidence of erythema or drainage. Heart: Regular rate and rhythm Abdomen: Soft, nontender, nondistended. Tenderness drainage staples in place to the prior ileostomy site. No evidence of erythema or purulence.    No results found for this or any previous visit (from the past 48 hour(s)). No results found.  Assessment/Plan:  1. Aftercare following surgery 60 year old male status post loop ileostomy takedown and chemotherapy port removal. Doing well. Penrose drain removed today. Discussed with patient the area we'll continue to drain for a few days and keep the bandage on until the drainage stops. He'll follow-up in clinic for 1 week for additional wound check and staple removal.     Clayburn Pert, MD [facility] General Surgeon  09/18/2015,11:55 AM

## 2015-09-23 ENCOUNTER — Ambulatory Visit (HOSPITAL_COMMUNITY): Payer: Disability Insurance | Attending: Cardiology

## 2015-09-23 ENCOUNTER — Other Ambulatory Visit: Payer: Self-pay

## 2015-09-23 ENCOUNTER — Other Ambulatory Visit: Payer: Self-pay | Admitting: Thoracic Surgery

## 2015-09-23 DIAGNOSIS — I371 Nonrheumatic pulmonary valve insufficiency: Secondary | ICD-10-CM | POA: Diagnosis not present

## 2015-09-23 DIAGNOSIS — I1310 Hypertensive heart and chronic kidney disease without heart failure, with stage 1 through stage 4 chronic kidney disease, or unspecified chronic kidney disease: Secondary | ICD-10-CM | POA: Insufficient documentation

## 2015-09-23 DIAGNOSIS — N189 Chronic kidney disease, unspecified: Secondary | ICD-10-CM | POA: Insufficient documentation

## 2015-09-23 DIAGNOSIS — I071 Rheumatic tricuspid insufficiency: Secondary | ICD-10-CM | POA: Insufficient documentation

## 2015-09-23 DIAGNOSIS — I358 Other nonrheumatic aortic valve disorders: Secondary | ICD-10-CM | POA: Diagnosis not present

## 2015-09-23 DIAGNOSIS — C20 Malignant neoplasm of rectum: Secondary | ICD-10-CM

## 2015-09-25 ENCOUNTER — Encounter: Payer: Self-pay | Admitting: Surgery

## 2015-09-26 ENCOUNTER — Telehealth: Payer: Self-pay

## 2015-09-26 ENCOUNTER — Ambulatory Visit (INDEPENDENT_AMBULATORY_CARE_PROVIDER_SITE_OTHER): Payer: 59 | Admitting: Surgery

## 2015-09-26 ENCOUNTER — Encounter: Payer: Self-pay | Admitting: Surgery

## 2015-09-26 ENCOUNTER — Inpatient Hospital Stay (HOSPITAL_BASED_OUTPATIENT_CLINIC_OR_DEPARTMENT_OTHER): Payer: 59 | Admitting: Hematology and Oncology

## 2015-09-26 ENCOUNTER — Inpatient Hospital Stay: Payer: 59 | Attending: Hematology and Oncology

## 2015-09-26 VITALS — BP 201/100 | HR 56 | Temp 98.3°F | Ht 70.0 in | Wt 223.8 lb

## 2015-09-26 VITALS — BP 165/84 | HR 53 | Temp 95.7°F | Resp 16 | Ht 70.0 in | Wt 223.8 lb

## 2015-09-26 DIAGNOSIS — Z932 Ileostomy status: Secondary | ICD-10-CM | POA: Diagnosis not present

## 2015-09-26 DIAGNOSIS — I739 Peripheral vascular disease, unspecified: Secondary | ICD-10-CM | POA: Diagnosis not present

## 2015-09-26 DIAGNOSIS — E785 Hyperlipidemia, unspecified: Secondary | ICD-10-CM | POA: Insufficient documentation

## 2015-09-26 DIAGNOSIS — Z9221 Personal history of antineoplastic chemotherapy: Secondary | ICD-10-CM | POA: Insufficient documentation

## 2015-09-26 DIAGNOSIS — R918 Other nonspecific abnormal finding of lung field: Secondary | ICD-10-CM | POA: Diagnosis not present

## 2015-09-26 DIAGNOSIS — I252 Old myocardial infarction: Secondary | ICD-10-CM | POA: Insufficient documentation

## 2015-09-26 DIAGNOSIS — C20 Malignant neoplasm of rectum: Secondary | ICD-10-CM | POA: Diagnosis not present

## 2015-09-26 DIAGNOSIS — I10 Essential (primary) hypertension: Secondary | ICD-10-CM | POA: Insufficient documentation

## 2015-09-26 DIAGNOSIS — N189 Chronic kidney disease, unspecified: Secondary | ICD-10-CM | POA: Insufficient documentation

## 2015-09-26 DIAGNOSIS — I2510 Atherosclerotic heart disease of native coronary artery without angina pectoris: Secondary | ICD-10-CM | POA: Diagnosis not present

## 2015-09-26 DIAGNOSIS — D509 Iron deficiency anemia, unspecified: Secondary | ICD-10-CM | POA: Insufficient documentation

## 2015-09-26 DIAGNOSIS — Z923 Personal history of irradiation: Secondary | ICD-10-CM

## 2015-09-26 DIAGNOSIS — Z87891 Personal history of nicotine dependence: Secondary | ICD-10-CM | POA: Diagnosis not present

## 2015-09-26 DIAGNOSIS — Z79899 Other long term (current) drug therapy: Secondary | ICD-10-CM | POA: Insufficient documentation

## 2015-09-26 LAB — COMPREHENSIVE METABOLIC PANEL
ALT: 17 U/L (ref 17–63)
AST: 18 U/L (ref 15–41)
Albumin: 4.2 g/dL (ref 3.5–5.0)
Alkaline Phosphatase: 76 U/L (ref 38–126)
Anion gap: 6 (ref 5–15)
BUN: 15 mg/dL (ref 6–20)
CO2: 28 mmol/L (ref 22–32)
Calcium: 9.1 mg/dL (ref 8.9–10.3)
Chloride: 106 mmol/L (ref 101–111)
Creatinine, Ser: 1.17 mg/dL (ref 0.61–1.24)
GFR calc Af Amer: >60 mL/min (ref >60)
GFR calc non Af Amer: >60 mL/min (ref >60)
Glucose, Bld: 88 mg/dL (ref 65–99)
Potassium: 3.6 mmol/L (ref 3.5–5.1)
Sodium: 140 mmol/L (ref 135–145)
Total Bilirubin: 0.5 mg/dL (ref 0.3–1.2)
Total Protein: 6.7 g/dL (ref 6.5–8.1)

## 2015-09-26 LAB — CBC WITH DIFFERENTIAL/PLATELET
Basophils Absolute: 0 10*3/uL (ref 0–0.1)
Basophils Relative: 1 %
Eosinophils Absolute: 0.1 10*3/uL (ref 0–0.7)
Eosinophils Relative: 3 %
HCT: 33.8 % — ABNORMAL LOW (ref 40.0–52.0)
Hemoglobin: 10.9 g/dL — ABNORMAL LOW (ref 13.0–18.0)
Lymphocytes Relative: 14 %
Lymphs Abs: 0.6 10*3/uL — ABNORMAL LOW (ref 1.0–3.6)
MCH: 24.1 pg — ABNORMAL LOW (ref 26.0–34.0)
MCHC: 32.3 g/dL (ref 32.0–36.0)
MCV: 74.8 fL — ABNORMAL LOW (ref 80.0–100.0)
Monocytes Absolute: 0.4 10*3/uL (ref 0.2–1.0)
Monocytes Relative: 9 %
Neutro Abs: 3.3 10*3/uL (ref 1.4–6.5)
Neutrophils Relative %: 73 %
Platelets: 295 10*3/uL (ref 150–440)
RBC: 4.51 MIL/uL (ref 4.40–5.90)
RDW: 17.4 % — ABNORMAL HIGH (ref 11.5–14.5)
WBC: 4.4 10*3/uL (ref 3.8–10.6)

## 2015-09-26 LAB — FERRITIN: Ferritin: 19 ng/mL — ABNORMAL LOW (ref 24–336)

## 2015-09-26 NOTE — Progress Notes (Signed)
Patient doing very well postop ileostomy closure. He has no complaints at this time is getting his strength back having no fevers or chills  Abdomen is soft and nontender staples are removed no erythema no drainage Steri-Strips and benzoin are placed  Patient doing very well recommend follow up on an as-needed basis

## 2015-09-26 NOTE — Patient Instructions (Signed)
Please call our office if you have questions or concerns.   

## 2015-09-26 NOTE — Progress Notes (Signed)
[facility] day:  09/26/2015   Chief Complaint: [name] HEIDELBERGER [name] is a 60 y.o. male with clinical stage IIA rectal cancer s/p neoadjuvant chemotherapy and radiation followed by low anterior resection who is seen for 3 month assessment.  HPI:  The patient was last seen in the medical oncology clinic on 06/23/2015.  Symptomatically, he had ongoing left foot stiffness and pain.  Exam was unremarkable.  CBC revealed a hematocrit of 32.7, hemoglobin 10.3, MCV 78.3, platelets 212,000, WBC 4600 with an ANC of 3400.  CMP revealed a creatinine of 1.23.  Liver function tests were normal.  CEA was 2.5.  At last visit, we discussed ostomy reversal after peripheral vascular disease was corrected.  He continued port flush every 6-8 weeks.  He underwent loop ileostomy takedown and port-a-cath removal on 09/08/2015.  Symptomatically, he feels "ok".  He notes his wound drain and staples are out.  He denies any abdominal pain.  He still has pain in his foot.  His left foot swells at times.  He fell without his walker.  He states that he can not stand for long periods.  Regarding his diet, he is eating chicken and fish. He is taking 1 iron pill a day.   Past Medical History  Diagnosis Date  . Rectal cancer ([place])   . Cancer ([place])   . Iron deficiency anemia   . Pulmonary nodules   . Hypertension   . Hyperlipidemia   . Hematuria   . Colon cancer ([place])   . Ureter, stricture   . Gout   . Atherosclerosis   . Peripheral vascular disease ([place])   . Coronary artery disease   . Myocardial infarction ([place]) 2005  . Chronic kidney disease     kidney stones, UTI    Past Surgical History  Procedure Laterality Date  . Colonoscopy 06/06/14    . Colonoscopy with esophagogastroduodenoscopy (egd)    . Portacath placement  07/01/2014    Dr. Marina Gravel  . Coronary angioplasty with stent placement  03/2004  . Bowel resection N/A 10/23/2014    Procedure: LOW ANTERIOR BOWEL  RESECTION;  Surgeon: Sherri Rad, MD;  Location: [facility];  Service: General;  Laterality: N/A;  . Laparotomy N/A 10/23/2014    Procedure: EXPLORATORY LAPAROTOMY;  Surgeon: Sherri Rad, MD;  Location: [facility];  Service: General;  Laterality: N/A;  . Diverting ileostomy N/A 10/23/2014    Procedure: DIVERTING ILEOSTOMY;  Surgeon: Sherri Rad, MD;  Location: [facility];  Service: General;  Laterality: N/A;  . Cystoscopy with stent placement Bilateral 10/23/2014    Procedure: CYSTOSCOPY WITH STENT PLACEMENT,URETHRAL DILATION, LEFT RETROGRADE PYELOGRAM, URETEROSCOPY;  Surgeon: Hollice Espy, MD;  Location: [facility];  Service: Urology;  Laterality: Bilateral;  . Peripheral vascular catheterization N/A 05/12/2015    Procedure: Abdominal Aortogram w/Lower Extremity;  Surgeon: Algernon Huxley, MD;  Location: [place] [facility];  Service: Cardiovascular;  Laterality: N/A;  . Peripheral vascular catheterization  05/12/2015    Procedure: Lower Extremity Intervention;  Surgeon: Algernon Huxley, MD;  Location: [place] [facility];  Service: Cardiovascular;;  . Peripheral vascular catheterization N/A 06/30/2015    Procedure: Abdominal Aortogram w/Lower Extremity;  Surgeon: Algernon Huxley, MD;  Location: [place];  Service: Cardiovascular;  Laterality: N/A;  . Peripheral vascular catheterization  06/30/2015    Procedure: Lower Extremity Intervention;  Surgeon: Algernon Huxley, MD;  Location: [place] [facility];  Service: Cardiovascular;;  . Colon  surgery    . Ileostomy closure N/A 09/08/2015    Procedure: ILEOSTOMY TAKEDOWN;  Surgeon: Clayburn Pert, MD;  Location: [facility];  Service: General;  Laterality: N/A;  . Port-a-cath removal Right 09/08/2015    Procedure: REMOVAL PORT-A-CATH;  Surgeon: Clayburn Pert, MD;  Location: [facility];  Service: General;  Laterality: Right;    Family History  Problem Relation Age of Onset  . Hypertension Brother   . Hypertension Father   . Diabetes Father   . Heart disease Paternal  Grandfather   . Diabetes Brother   . Hypertension Brother     Social History:  reports that he quit smoking about 30 years ago. His smoking use included Cigarettes. He has never used smokeless tobacco. He reports that he does not drink alcohol or use illicit drugs.  The patient is accompanied by his wife today.  Allergies:  Allergies  Allergen Reactions  . No Known Allergies     Current Medications: Current Outpatient Prescriptions  Medication Sig Dispense Refill  . acetaminophen (TYLENOL) 500 MG tablet Take 500 mg by mouth every 6 (six) hours as needed.    Marland Kitchen apixaban (ELIQUIS) 5 MG TABS tablet Take 1 tablet (5 mg total) by mouth 2 (two) times daily. 60 tablet 2  . aspirin EC 81 MG tablet Take 81 mg by mouth daily.     . cloNIDine (CATAPRES) 0.2 MG tablet Take 0.2 mg by mouth daily.    . colchicine 0.6 MG tablet Take 0.6 mg by mouth daily.    . ferrous sulfate 325 (65 FE) MG tablet Take 325 mg by mouth daily with breakfast.    . Ascorbic Acid (VITAMIN C) 1000 MG tablet Take 1,000 mg by mouth daily. Reported on 09/26/2015    . HYDROcodone-acetaminophen (NORCO/VICODIN) 5-325 MG tablet Take 1-2 tablets by mouth every 4 (four) hours as needed for moderate pain. (Patient not taking: Reported on 09/26/2015) 30 tablet 0   No current facility-administered medications for this visit.   Facility-Administered Medications Ordered in Other Visits  Medication Dose Route Frequency Provider Last Rate Last Dose  . heparin lock flush 100 unit/mL  500 Units Intravenous Once Lequita Asal, MD      . sodium chloride 0.9 % injection 10 mL  10 mL Intravenous PRN Dallas Schimke, MD   10 mL at 08/26/14 1300  . sodium chloride 0.9 % injection 10 mL  10 mL Intracatheter PRN Lequita Asal, MD   10 mL at 03/19/15 1121  . sodium chloride flush (NS) 0.9 % injection 10 mL  10 mL Intravenous PRN Lequita Asal, MD        Review of Systems:  GENERAL:  Feels "ok".   No fevers, sweats.  Weight up 10  pounds. PERFORMANCE STATUS (ECOG):  2 HEENT:  No visual changes, runny nose, sore throat, mouth sores or tenderness. Lungs: No shortness of breath or cough.  No hemoptysis. Cardiac:  No chest pain, palpitations, orthopnea, or PND. GI:  Ostomy working well.  No nausea, vomiting, diarrhea, constipation, melena or hematochezia. GU:  No urgency, frequency, dysuria, and intermittent hematuria. Musculoskeletal:  No back pain.  No joint pain.  No muscle tenderness. Extremities:  Peripheral vascular disease s/p angioplasty and stent.  Left foot pain and swelling. Skin:  No rashes or skin changes. Neuro: No headache, numbness or weakness, balance or coordination issues. Endocrine:  No diabetes, thyroid issues, hot flashes or night sweats. Psych:  No mood changes, depression or anxiety. Review of  systems:  All other systems reviewed and found to be negative.   Physical Exam: Blood pressure 165/84, pulse 53, temperature 95.7 F (35.4 C), temperature source Tympanic, resp. rate 16, height _0  (1.778 m), weight 223 lb 12.3 oz (101.5 kg).  GENERAL:  Well developed, well nourished, sitting comfortably in the exam room in no acute distress.  He has a rolling walker at his side. MENTAL STATUS:  Alert and oriented to person, place and time. HEAD:  Brown hair.  Slight mustache.  Normocephalic, atraumatic, face symmetric, no Cushingoid features. EYES:  Brown eyes.  Pupils equal round and reactive to light and accomodation.  No conjunctivitis or scleral icterus. ENT:  Oropharynx clear without lesion.  Tongue normal. Mucous membranes moist.  RESPIRATORY:  Clear to auscultation without rales, wheezes or rhonchi. CARDIOVASCULAR:  Regular rate and rhythm without murmur, rub or gallop. ABDOMEN:  Soft, non-tender with active bowel sounds, and no hepatosplenomegaly.  No masses. SKIN:  No rashes, ulcers or lesions. EXTREMITIES:  Nail beds dark post chemo.  No skin discoloration.  No palpable cords. LYMPH NODES: No  palpable cervical, supraclavicular, axillary or inguinal adenopathy  NEUROLOGICAL: Unremarkable. PSYCH:  Appropriate.    Appointment on 09/26/2015  Component Date Value Ref Range Status  . WBC 09/26/2015 4.4  3.8 - 10.6 K/uL Final  . RBC 09/26/2015 4.51  4.40 - 5.90 MIL/uL Final  . Hemoglobin 09/26/2015 10.9* 13.0 - 18.0 g/dL Final  . HCT 09/26/2015 33.8* 40.0 - 52.0 % Final  . MCV 09/26/2015 74.8* 80.0 - 100.0 fL Final  . MCH 09/26/2015 24.1* 26.0 - 34.0 pg Final  . MCHC 09/26/2015 32.3  32.0 - 36.0 g/dL Final  . RDW 09/26/2015 17.4* 11.5 - 14.5 % Final  . Platelets 09/26/2015 295  150 - 440 K/uL Final  . Neutrophils Relative % 09/26/2015 73   Final  . Neutro Abs 09/26/2015 3.3  1.4 - 6.5 K/uL Final  . Lymphocytes Relative 09/26/2015 14   Final  . Lymphs Abs 09/26/2015 0.6* 1.0 - 3.6 K/uL Final  . Monocytes Relative 09/26/2015 9   Final  . Monocytes Absolute 09/26/2015 0.4  0.2 - 1.0 K/uL Final  . Eosinophils Relative 09/26/2015 3   Final  . Eosinophils Absolute 09/26/2015 0.1  0 - 0.7 K/uL Final  . Basophils Relative 09/26/2015 1   Final  . Basophils Absolute 09/26/2015 0.0  0 - 0.1 K/uL Final  . Sodium 09/26/2015 140  135 - 145 mmol/L Final  . Potassium 09/26/2015 3.6  3.5 - 5.1 mmol/L Final  . Chloride 09/26/2015 106  101 - 111 mmol/L Final  . CO2 09/26/2015 28  22 - 32 mmol/L Final  . Glucose, Bld 09/26/2015 88  65 - 99 mg/dL Final  . BUN 09/26/2015 15  6 - 20 mg/dL Final  . Creatinine, Ser 09/26/2015 1.17  0.61 - 1.24 mg/dL Final  . Calcium 09/26/2015 9.1  8.9 - 10.3 mg/dL Final  . Total Protein 09/26/2015 6.7  6.5 - 8.1 g/dL Final  . Albumin 09/26/2015 4.2  3.5 - 5.0 g/dL Final  . AST 09/26/2015 18  15 - 41 U/L Final  . ALT 09/26/2015 17  17 - 63 U/L Final  . Alkaline Phosphatase 09/26/2015 76  38 - 126 U/L Final  . Total Bilirubin 09/26/2015 0.5  0.3 - 1.2 mg/dL Final  . GFR calc non Af Amer 09/26/2015 >60  >60 mL/min Final  . GFR calc Af Amer 09/26/2015 >60  >60  mL/min Final  Comment: (NOTE) The eGFR has been calculated using the CKD EPI equation. This calculation has not been validated in all clinical situations. eGFR's persistently <60 mL/min signify possible Chronic Kidney Disease.   . Anion gap 09/26/2015 6  5 - 15 Final    Assessment:  [name] Kehr [name] is a 59 y.o. male with clinical stage IIA (T3N0) rectal cancer.  Colonoscopy on 06/06/2014 revealed a large non-circumferential distal rectal mass. Biopsy was positive for adenocarcinoma.   CT scans revealed a 3 mm pulmonary nodule (indeterminate). There was a large rectal mass suspicious for transmural extension into the mesorectum.There was equivocal perirectal and sigmoid mesocolon nodes. There was no liver metastasis or extrapelvic disease.  Endoscopic ultrasound at [facility] revealed a 70% circumferential mass at 9-14 cm from the anal verge and measuring 9 mm in maximal thickness. Ultrasound suggested breakthrough of the muscularis propria with invasion into the perirectal fat.  He received neoadjuvant chemotherapy (continuous infusion 5FU) and radiation. He received radiation from 07/03/2014 until 08/16/2014. He underwent low anterior resection with diverting loop ileostomy on 10/23/2014.  Pathology revealed a complete response.  Zero of 10 lymph nodes were positive.  He underwent ileostomy take-down on 09/08/2015.  He has iron deficiency anemia.  Ferritin was 34 on 10/04/2014.  He is on oral iron (1 tablet/day).    He received 8 cycles of FOLFOX chemotherapy (12/27/2014 - 04/15/2015).  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    He has significant peripheral vascular disease.  On 05/12/2015, he underwent angioplasty of the left mid to distal posterior tibial artery, above-knee popliteal artery and entire SFA.  He underwent stent 2 to the left SFA and popliteal artery for multiple areas of greater than 50% residual stenosis after angioplasty.     Symptomatically, he is doing well s/p ileostomy take-down.  He has a microcytic anemia.  Ferritin is 19.  Plan: 1.  Labs today:  CBC with diff, CMP, CEA, ferritin. 2.  Continue oral iron.  Increase iron to BID.  Take with vitamin C or orange juice. 3.  RTC in 3 months for MD assessment and labs (CBC with diff, CMP, ferritin, and CEA).   Lequita Asal, MD  09/26/2015, 3:36 PM

## 2015-09-26 NOTE — Telephone Encounter (Signed)
Called pt per MD to encourage patient to take his oral iron twice a day with OJ or vitamin C.   No answer left VM to please call us back.

## 2015-09-26 NOTE — Progress Notes (Signed)
Pt reports had ileostomy takedown, port removed, and stents placed for better left leg blood flow.

## 2015-09-28 LAB — CEA (SERIAL MONITOR): CEA: 3.2 ng/mL (ref 0.0–4.7)

## 2015-09-29 DIAGNOSIS — R7303 Prediabetes: Secondary | ICD-10-CM | POA: Diagnosis not present

## 2015-09-29 DIAGNOSIS — E119 Type 2 diabetes mellitus without complications: Secondary | ICD-10-CM | POA: Diagnosis not present

## 2015-09-29 DIAGNOSIS — M109 Gout, unspecified: Secondary | ICD-10-CM | POA: Diagnosis not present

## 2015-09-29 DIAGNOSIS — C20 Malignant neoplasm of rectum: Secondary | ICD-10-CM | POA: Diagnosis not present

## 2015-09-29 DIAGNOSIS — I739 Peripheral vascular disease, unspecified: Secondary | ICD-10-CM | POA: Diagnosis not present

## 2015-09-29 DIAGNOSIS — I2510 Atherosclerotic heart disease of native coronary artery without angina pectoris: Secondary | ICD-10-CM | POA: Diagnosis not present

## 2015-09-29 DIAGNOSIS — I10 Essential (primary) hypertension: Secondary | ICD-10-CM | POA: Diagnosis not present

## 2015-09-29 DIAGNOSIS — E785 Hyperlipidemia, unspecified: Secondary | ICD-10-CM | POA: Diagnosis not present

## 2015-09-30 ENCOUNTER — Telehealth: Payer: Self-pay

## 2015-09-30 NOTE — Telephone Encounter (Signed)
Called pt per MD encouraged patient to take his oral iron twice a day with OJ or vitamin C.   Pt verbalized an understanding and no other concerns noted.

## 2015-10-13 DIAGNOSIS — I209 Angina pectoris, unspecified: Secondary | ICD-10-CM | POA: Diagnosis not present

## 2015-10-13 DIAGNOSIS — E669 Obesity, unspecified: Secondary | ICD-10-CM | POA: Diagnosis not present

## 2015-10-13 DIAGNOSIS — I739 Peripheral vascular disease, unspecified: Secondary | ICD-10-CM | POA: Diagnosis not present

## 2015-10-13 DIAGNOSIS — R0602 Shortness of breath: Secondary | ICD-10-CM | POA: Diagnosis not present

## 2015-11-03 DIAGNOSIS — Z1389 Encounter for screening for other disorder: Secondary | ICD-10-CM | POA: Diagnosis not present

## 2015-11-03 DIAGNOSIS — M109 Gout, unspecified: Secondary | ICD-10-CM | POA: Diagnosis not present

## 2015-11-03 DIAGNOSIS — I10 Essential (primary) hypertension: Secondary | ICD-10-CM | POA: Diagnosis not present

## 2015-11-03 DIAGNOSIS — R7309 Other abnormal glucose: Secondary | ICD-10-CM | POA: Diagnosis not present

## 2015-11-05 ENCOUNTER — Encounter: Payer: Self-pay | Admitting: Cardiology

## 2015-11-20 DIAGNOSIS — I70211 Atherosclerosis of native arteries of extremities with intermittent claudication, right leg: Secondary | ICD-10-CM | POA: Diagnosis not present

## 2015-11-20 DIAGNOSIS — I70222 Atherosclerosis of native arteries of extremities with rest pain, left leg: Secondary | ICD-10-CM | POA: Diagnosis not present

## 2015-11-20 DIAGNOSIS — E119 Type 2 diabetes mellitus without complications: Secondary | ICD-10-CM | POA: Diagnosis not present

## 2015-11-20 DIAGNOSIS — M79609 Pain in unspecified limb: Secondary | ICD-10-CM | POA: Diagnosis not present

## 2015-11-20 DIAGNOSIS — E785 Hyperlipidemia, unspecified: Secondary | ICD-10-CM | POA: Diagnosis not present

## 2015-11-20 DIAGNOSIS — I70213 Atherosclerosis of native arteries of extremities with intermittent claudication, bilateral legs: Secondary | ICD-10-CM | POA: Diagnosis not present

## 2015-11-20 DIAGNOSIS — I10 Essential (primary) hypertension: Secondary | ICD-10-CM | POA: Diagnosis not present

## 2015-11-20 DIAGNOSIS — I739 Peripheral vascular disease, unspecified: Secondary | ICD-10-CM | POA: Diagnosis not present

## 2015-12-29 ENCOUNTER — Encounter: Payer: Self-pay | Admitting: Hematology and Oncology

## 2015-12-29 ENCOUNTER — Inpatient Hospital Stay (HOSPITAL_BASED_OUTPATIENT_CLINIC_OR_DEPARTMENT_OTHER): Payer: 59 | Admitting: Hematology and Oncology

## 2015-12-29 ENCOUNTER — Inpatient Hospital Stay: Payer: 59 | Attending: Hematology and Oncology

## 2015-12-29 ENCOUNTER — Telehealth: Payer: Self-pay | Admitting: *Deleted

## 2015-12-29 VITALS — BP 136/85 | HR 50 | Temp 94.8°F | Wt 230.4 lb

## 2015-12-29 DIAGNOSIS — Z87442 Personal history of urinary calculi: Secondary | ICD-10-CM | POA: Insufficient documentation

## 2015-12-29 DIAGNOSIS — I129 Hypertensive chronic kidney disease with stage 1 through stage 4 chronic kidney disease, or unspecified chronic kidney disease: Secondary | ICD-10-CM | POA: Insufficient documentation

## 2015-12-29 DIAGNOSIS — I252 Old myocardial infarction: Secondary | ICD-10-CM | POA: Diagnosis not present

## 2015-12-29 DIAGNOSIS — Z79899 Other long term (current) drug therapy: Secondary | ICD-10-CM | POA: Insufficient documentation

## 2015-12-29 DIAGNOSIS — Z85038 Personal history of other malignant neoplasm of large intestine: Secondary | ICD-10-CM | POA: Diagnosis not present

## 2015-12-29 DIAGNOSIS — Z923 Personal history of irradiation: Secondary | ICD-10-CM | POA: Insufficient documentation

## 2015-12-29 DIAGNOSIS — Z7901 Long term (current) use of anticoagulants: Secondary | ICD-10-CM | POA: Insufficient documentation

## 2015-12-29 DIAGNOSIS — Z9221 Personal history of antineoplastic chemotherapy: Secondary | ICD-10-CM | POA: Insufficient documentation

## 2015-12-29 DIAGNOSIS — I739 Peripheral vascular disease, unspecified: Secondary | ICD-10-CM | POA: Insufficient documentation

## 2015-12-29 DIAGNOSIS — K5900 Constipation, unspecified: Secondary | ICD-10-CM | POA: Diagnosis not present

## 2015-12-29 DIAGNOSIS — D509 Iron deficiency anemia, unspecified: Secondary | ICD-10-CM

## 2015-12-29 DIAGNOSIS — N189 Chronic kidney disease, unspecified: Secondary | ICD-10-CM | POA: Insufficient documentation

## 2015-12-29 DIAGNOSIS — M109 Gout, unspecified: Secondary | ICD-10-CM | POA: Insufficient documentation

## 2015-12-29 DIAGNOSIS — C20 Malignant neoplasm of rectum: Secondary | ICD-10-CM | POA: Insufficient documentation

## 2015-12-29 DIAGNOSIS — E785 Hyperlipidemia, unspecified: Secondary | ICD-10-CM | POA: Diagnosis not present

## 2015-12-29 DIAGNOSIS — Z87891 Personal history of nicotine dependence: Secondary | ICD-10-CM | POA: Diagnosis not present

## 2015-12-29 DIAGNOSIS — Z7982 Long term (current) use of aspirin: Secondary | ICD-10-CM | POA: Insufficient documentation

## 2015-12-29 LAB — CBC WITH DIFFERENTIAL/PLATELET
Basophils Absolute: 0 10*3/uL (ref 0–0.1)
Basophils Relative: 1 %
Eosinophils Absolute: 0.2 10*3/uL (ref 0–0.7)
Eosinophils Relative: 4 %
HCT: 40.4 % (ref 40.0–52.0)
Hemoglobin: 13.2 g/dL (ref 13.0–18.0)
Lymphocytes Relative: 17 %
Lymphs Abs: 0.7 10*3/uL — ABNORMAL LOW (ref 1.0–3.6)
MCH: 25.1 pg — ABNORMAL LOW (ref 26.0–34.0)
MCHC: 32.7 g/dL (ref 32.0–36.0)
MCV: 77 fL — ABNORMAL LOW (ref 80.0–100.0)
Monocytes Absolute: 0.4 10*3/uL (ref 0.2–1.0)
Monocytes Relative: 8 %
Neutro Abs: 3.1 10*3/uL (ref 1.4–6.5)
Neutrophils Relative %: 70 %
Platelets: 218 10*3/uL (ref 150–440)
RBC: 5.24 MIL/uL (ref 4.40–5.90)
RDW: 17.7 % — ABNORMAL HIGH (ref 11.5–14.5)
WBC: 4.4 10*3/uL (ref 3.8–10.6)

## 2015-12-29 LAB — COMPREHENSIVE METABOLIC PANEL
ALT: 39 U/L (ref 17–63)
AST: 31 U/L (ref 15–41)
Albumin: 4.3 g/dL (ref 3.5–5.0)
Alkaline Phosphatase: 91 U/L (ref 38–126)
Anion gap: 7 (ref 5–15)
BUN: 21 mg/dL — ABNORMAL HIGH (ref 6–20)
CO2: 28 mmol/L (ref 22–32)
Calcium: 9.2 mg/dL (ref 8.9–10.3)
Chloride: 102 mmol/L (ref 101–111)
Creatinine, Ser: 1.3 mg/dL — ABNORMAL HIGH (ref 0.61–1.24)
GFR calc Af Amer: >60 mL/min (ref >60)
GFR calc non Af Amer: 57 mL/min — ABNORMAL LOW (ref >60)
Glucose, Bld: 111 mg/dL — ABNORMAL HIGH (ref 65–99)
Potassium: 3.7 mmol/L (ref 3.5–5.1)
Sodium: 137 mmol/L (ref 135–145)
Total Bilirubin: 0.5 mg/dL (ref 0.3–1.2)
Total Protein: 7 g/dL (ref 6.5–8.1)

## 2015-12-29 LAB — FERRITIN: Ferritin: 35 ng/mL (ref 24–336)

## 2015-12-29 NOTE — Telephone Encounter (Signed)
Called patient's spouse to inform her that patient's ferritin is improving.  MD recommends he continue oral iron.  Verbalized understanding.

## 2015-12-29 NOTE — Progress Notes (Signed)
[facility] day:  12/29/15   Chief Complaint: TREMANE SPURGEON [name] is a 60 y.o. male with clinical stage IIA rectal cancer s/p neoadjuvant chemotherapy and radiation followed by low anterior resection who is seen for 3 month assessment.  HPI:  The patient was last seen in the medical oncology clinic on 09/26/2015.  At that time, he was doing well post loop ileostomy takedown.  He was struggling with left foot pain and swelling secondary to revascularization issues.  Hematocrit was 33.8, hemoglobin 10.9, and MCV 74.8.  Ferritin was 19.  His iron was increased from 1 pill to 2 pills/day.  CEA was 3.2.  During the interim he has been "okay".   He hasn't gone back to work as his feet are problem. If he stands on his feet too long he hurts secondary to known peripheral vascular disease.  His left foot will also swell. He is taking his iron twice daily.  Sometimes he gets constipated.  Vegetables help.  Past Medical History:  Diagnosis Date  . Atherosclerosis   . Cancer ([place])   . Chronic kidney disease    kidney stones, UTI  . Colon cancer ([place])   . Coronary artery disease   . Gout   . Hematuria   . Hyperlipidemia   . Hypertension   . Iron deficiency anemia   . Myocardial infarction ([place]) 2005  . Peripheral vascular disease ([place])   . Pulmonary nodules   . Rectal cancer ([place])   . Ureter, stricture     Past Surgical History:  Procedure Laterality Date  . BOWEL RESECTION N/A 10/23/2014   Procedure: LOW ANTERIOR BOWEL RESECTION;  Surgeon: Sherri Rad, MD;  Location: [facility];  Service: General;  Laterality: N/A;  . COLON SURGERY    . COLONOSCOPY 06/06/14    . COLONOSCOPY WITH ESOPHAGOGASTRODUODENOSCOPY (EGD)    . CORONARY ANGIOPLASTY WITH STENT PLACEMENT  03/2004  . CYSTOSCOPY WITH STENT PLACEMENT Bilateral 10/23/2014   Procedure: CYSTOSCOPY WITH STENT PLACEMENT,URETHRAL DILATION, LEFT RETROGRADE PYELOGRAM, URETEROSCOPY;  Surgeon: Hollice Espy,  MD;  Location: [facility];  Service: Urology;  Laterality: Bilateral;  . DIVERTING ILEOSTOMY N/A 10/23/2014   Procedure: DIVERTING ILEOSTOMY;  Surgeon: Sherri Rad, MD;  Location: [facility];  Service: General;  Laterality: N/A;  . ILEOSTOMY CLOSURE N/A 09/08/2015   Procedure: ILEOSTOMY TAKEDOWN;  Surgeon: Clayburn Pert, MD;  Location: [facility];  Service: General;  Laterality: N/A;  . LAPAROTOMY N/A 10/23/2014   Procedure: EXPLORATORY LAPAROTOMY;  Surgeon: Sherri Rad, MD;  Location: [facility];  Service: General;  Laterality: N/A;  . PERIPHERAL VASCULAR CATHETERIZATION N/A 05/12/2015   Procedure: Abdominal Aortogram w/Lower Extremity;  Surgeon: Algernon Huxley, MD;  Location: [facility];  Service: Cardiovascular;  Laterality: N/A;  . PERIPHERAL VASCULAR CATHETERIZATION  05/12/2015   Procedure: Lower Extremity Intervention;  Surgeon: Algernon Huxley, MD;  Location: [facility];  Service: Cardiovascular;;  . PERIPHERAL VASCULAR CATHETERIZATION N/A 06/30/2015   Procedure: Abdominal Aortogram w/Lower Extremity;  Surgeon: Algernon Huxley, MD;  Location: [facility];  Service: Cardiovascular;  Laterality: N/A;  . PERIPHERAL VASCULAR CATHETERIZATION  06/30/2015   Procedure: Lower Extremity Intervention;  Surgeon: Algernon Huxley, MD;  Location: [facility] [place] [facility];  Service: Cardiovascular;;  . PORT-A-CATH REMOVAL Right 09/08/2015   Procedure: REMOVAL PORT-A-CATH;  Surgeon: Clayburn Pert, MD;  Location: [facility];  Service: General;  Laterality: Right;  . PORTACATH PLACEMENT  07/01/2014  Dr. Marina Gravel    Family History  Problem Relation Age of Onset  . Hypertension Brother   . Hypertension Father   . Diabetes Father   . Heart disease Paternal Grandfather   . Diabetes Brother   . Hypertension Brother     Social History:  reports that he quit smoking about 31 years ago. His smoking use included Cigarettes. He has never used smokeless tobacco. He reports that he does not drink alcohol or use drugs.  He  lives in [place].  He is working on disability.  The patient is alone today.  Allergies:  Allergies  Allergen Reactions  . No Known Allergies     Current Medications: Current Outpatient Prescriptions  Medication Sig Dispense Refill  . acetaminophen (TYLENOL) 500 MG tablet Take 500 mg by mouth every 6 (six) hours as needed.    Marland Kitchen apixaban (ELIQUIS) 5 MG TABS tablet Take 1 tablet (5 mg total) by mouth 2 (two) times daily. 60 tablet 2  . Ascorbic Acid (VITAMIN C) 1000 MG tablet Take 1,000 mg by mouth daily. Reported on 09/26/2015    . aspirin EC 81 MG tablet Take 81 mg by mouth daily.     . cloNIDine (CATAPRES) 0.2 MG tablet Take 0.2 mg by mouth daily.    . colchicine 0.6 MG tablet Take 0.6 mg by mouth daily.    . ferrous sulfate 325 (65 FE) MG tablet Take 325 mg by mouth daily with breakfast.    . hydrochlorothiazide (HYDRODIURIL) 50 MG tablet Take by mouth.    Marland Kitchen HYDROcodone-acetaminophen (NORCO/VICODIN) 5-325 MG tablet Take 1-2 tablets by mouth every 4 (four) hours as needed for moderate pain. 30 tablet 0   No current facility-administered medications for this visit.    Facility-Administered Medications Ordered in Other Visits  Medication Dose Route Frequency Provider Last Rate Last Dose  . heparin lock flush 100 unit/mL  500 Units Intravenous Once Lequita Asal, MD      . sodium chloride 0.9 % injection 10 mL  10 mL Intravenous PRN Dallas Schimke, MD   10 mL at 08/26/14 1300  . sodium chloride 0.9 % injection 10 mL  10 mL Intracatheter PRN Lequita Asal, MD   10 mL at 03/19/15 1121  . sodium chloride flush (NS) 0.9 % injection 10 mL  10 mL Intravenous PRN Lequita Asal, MD        Review of Systems:  GENERAL:  Feels "ok".   No fevers, sweats.  Weight up 7 pounds. PERFORMANCE STATUS (ECOG):  2 HEENT:  No visual changes, runny nose, sore throat, mouth sores or tenderness. Lungs: No shortness of breath or cough.  No hemoptysis. Cardiac:  No chest pain, palpitations,  orthopnea, or PND. GI:  Constipation.  No nausea, vomiting, diarrhea,melena or hematochezia. GU:  No urgency, frequency, dysuria, and intermittent hematuria. Musculoskeletal:  No back pain.  No joint pain.  No muscle tenderness. Extremities:  Peripheral vascular disease s/p angioplasty and stent.  Left foot pain and swelling. Skin:  No rashes or skin changes. Neuro: No headache, numbness or weakness, balance or coordination issues. Endocrine:  No diabetes, thyroid issues, hot flashes or night sweats. Psych:  No mood changes, depression or anxiety. Review of systems:  All other systems reviewed and found to be negative.   Physical Exam: Blood pressure 136/85, pulse (!) 50, temperature (!) 94.8 F (34.9 C), weight 230 lb 6.1 oz (104.5 kg).  GENERAL:  Well developed, well nourished, heavyset gentleman sitting comfortably  in the exam room in no acute distress.  He has a cane at his side. MENTAL STATUS:  Alert and oriented to person, place and time. HEAD:  Wearing a black cap.  Brown hair.  Slight mustache.  Normocephalic, atraumatic, face symmetric, no Cushingoid features. EYES:  Brown eyes.  Pupils equal round and reactive to light and accomodation.  No conjunctivitis or scleral icterus. ENT:  Oropharynx clear without lesion.  Tongue normal. Mucous membranes moist.  RESPIRATORY:  Clear to auscultation without rales, wheezes or rhonchi. CARDIOVASCULAR:  Regular rate and rhythm without murmur, rub or gallop. ABDOMEN:  Well healed old ostomy site.  Soft, non-tender with active bowel sounds, and no hepatosplenomegaly.  No masses. SKIN:  No rashes, ulcers or lesions. EXTREMITIES:  Nail beds dark post chemo.  No skin discoloration.  No palpable cords. LYMPH NODES: No palpable cervical, supraclavicular, axillary or inguinal adenopathy  NEUROLOGICAL: Unremarkable. PSYCH:  Appropriate.    Appointment on 12/29/2015  Component Date Value Ref Range Status  . WBC 12/29/2015 4.4  3.8 - 10.6 K/uL Final   . RBC 12/29/2015 5.24  4.40 - 5.90 MIL/uL Final  . Hemoglobin 12/29/2015 13.2  13.0 - 18.0 g/dL Final  . HCT 12/29/2015 40.4  40.0 - 52.0 % Final  . MCV 12/29/2015 77.0* 80.0 - 100.0 fL Final  . MCH 12/29/2015 25.1* 26.0 - 34.0 pg Final  . MCHC 12/29/2015 32.7  32.0 - 36.0 g/dL Final  . RDW 12/29/2015 17.7* 11.5 - 14.5 % Final  . Platelets 12/29/2015 218  150 - 440 K/uL Final  . Neutrophils Relative % 12/29/2015 70  % Final  . Neutro Abs 12/29/2015 3.1  1.4 - 6.5 K/uL Final  . Lymphocytes Relative 12/29/2015 17  % Final  . Lymphs Abs 12/29/2015 0.7* 1.0 - 3.6 K/uL Final  . Monocytes Relative 12/29/2015 8  % Final  . Monocytes Absolute 12/29/2015 0.4  0.2 - 1.0 K/uL Final  . Eosinophils Relative 12/29/2015 4  % Final  . Eosinophils Absolute 12/29/2015 0.2  0 - 0.7 K/uL Final  . Basophils Relative 12/29/2015 1  % Final  . Basophils Absolute 12/29/2015 0.0  0 - 0.1 K/uL Final  . Sodium 12/29/2015 137  135 - 145 mmol/L Final  . Potassium 12/29/2015 3.7  3.5 - 5.1 mmol/L Final  . Chloride 12/29/2015 102  101 - 111 mmol/L Final  . CO2 12/29/2015 28  22 - 32 mmol/L Final  . Glucose, Bld 12/29/2015 111* 65 - 99 mg/dL Final  . BUN 12/29/2015 21* 6 - 20 mg/dL Final  . Creatinine, Ser 12/29/2015 1.30* 0.61 - 1.24 mg/dL Final  . Calcium 12/29/2015 9.2  8.9 - 10.3 mg/dL Final  . Total Protein 12/29/2015 7.0  6.5 - 8.1 g/dL Final  . Albumin 12/29/2015 4.3  3.5 - 5.0 g/dL Final  . AST 12/29/2015 31  15 - 41 U/L Final  . ALT 12/29/2015 39  17 - 63 U/L Final  . Alkaline Phosphatase 12/29/2015 91  38 - 126 U/L Final  . Total Bilirubin 12/29/2015 0.5  0.3 - 1.2 mg/dL Final  . GFR calc non Af Amer 12/29/2015 57* >60 mL/min Final  . GFR calc Af Amer 12/29/2015 >60  >60 mL/min Final   Comment: (NOTE) The eGFR has been calculated using the CKD EPI equation. This calculation has not been validated in all clinical situations. eGFR's persistently <60 mL/min signify possible Chronic Kidney Disease.    . Anion gap 12/29/2015 7  5 - 15 Final  Assessment:  JAYME CHAM [name] is a 60 y.o. male with clinical stage IIA (T3N0) rectal cancer.  Colonoscopy on 06/06/2014 revealed a large non-circumferential distal rectal mass. Biopsy was positive for adenocarcinoma.   CT scans revealed a 3 mm pulmonary nodule (indeterminate). There was a large rectal mass suspicious for transmural extension into the mesorectum.There was equivocal perirectal and sigmoid mesocolon nodes. There was no liver metastasis or extrapelvic disease.  [facility]oscopic ultrasound at [facility] revealed a 70% circumferential mass at 9-14 cm from the anal verge and measuring 9 mm in maximal thickness. Ultrasound suggested breakthrough of the muscularis propria with invasion into the perirectal fat.  He received neoadjuvant chemotherapy (continuous infusion 5FU) and radiation. He received radiation from 07/03/2014 until 08/16/2014. He underwent low anterior resection with diverting loop ileostomy on 10/23/2014.  Pathology revealed a complete response.  Zero of 10 lymph nodes were positive.  He underwent ileostomy take-down on 09/08/2015.  He has iron deficiency anemia.  Ferritin was 34 on 10/04/2014.  He is on oral iron (1 tablet/day).    He received 8 cycles of FOLFOX chemotherapy (12/27/2014 - 04/15/2015).  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    He has significant peripheral vascular disease.  On 05/12/2015, he underwent angioplasty of the left mid to distal posterior tibial artery, above-knee popliteal artery and entire SFA.  He underwent stent 2 to the left SFA and popliteal artery for multiple areas of greater than 50% residual stenosis after angioplasty.    Symptomatically, he has chronic issues secondary to peripheral vascular disease.  Hematocrit is 40.4.  Ferritin is 35.  Plan: 1.  Labs today:  CBC with diff, CMP, CEA, ferritin. 2.  Continue oral iron until ferritin 100. 3.  Discuss  management of constipation. 4.  Anticipate follow-up colonoscopy. 5.  Consult Dr. Allen Norris - ongoing surveillance colonoscopies. 6.  RTC in 3 months for MD assessment and labs (CBC with diff, CMP, ferritin, and CEA).   Lequita Asal, MD  12/29/2015, 3:37 PM

## 2015-12-29 NOTE — Progress Notes (Signed)
Patient offers no complaints today. 

## 2015-12-30 ENCOUNTER — Other Ambulatory Visit: Payer: Self-pay | Admitting: *Deleted

## 2015-12-30 DIAGNOSIS — D509 Iron deficiency anemia, unspecified: Secondary | ICD-10-CM

## 2015-12-30 DIAGNOSIS — C20 Malignant neoplasm of rectum: Secondary | ICD-10-CM

## 2015-12-30 LAB — CEA: CEA: 2.8 ng/mL (ref 0.0–4.7)

## 2016-01-04 ENCOUNTER — Encounter: Payer: Self-pay | Admitting: Hematology and Oncology

## 2016-02-02 ENCOUNTER — Ambulatory Visit (INDEPENDENT_AMBULATORY_CARE_PROVIDER_SITE_OTHER): Payer: 59 | Admitting: Gastroenterology

## 2016-02-02 ENCOUNTER — Encounter: Payer: Self-pay | Admitting: Gastroenterology

## 2016-02-02 ENCOUNTER — Other Ambulatory Visit: Payer: Self-pay

## 2016-02-02 DIAGNOSIS — C20 Malignant neoplasm of rectum: Secondary | ICD-10-CM

## 2016-02-02 NOTE — Progress Notes (Signed)
Primary Care Physician: [facility]  Primary Gastroenterologist:  Dr. Lucilla Lame  Chief Complaint  Patient presents with  . Follow up rectal cancer    HPI: [name] KOSIBA [name] is a 60 y.o. male here For follow-up after having rectal cancer. The patient underwent surgery with preservation of his rectum. The patient had a colostomy that was then reversed. The patient states he has had some hard stools in the past. There is no report of any unexplained weight loss fevers chills nausea or vomiting. The patient is now here for possible repeat colonoscopy.  Current Outpatient Prescriptions  Medication Sig Dispense Refill  . acetaminophen (TYLENOL) 500 MG tablet Take 500 mg by mouth every 6 (six) hours as needed.    Marland Kitchen apixaban (ELIQUIS) 5 MG TABS tablet Take 1 tablet (5 mg total) by mouth 2 (two) times daily. 60 tablet 2  . aspirin EC 81 MG tablet Take 81 mg by mouth daily.     . cloNIDine (CATAPRES) 0.2 MG tablet Take 0.2 mg by mouth daily.    . colchicine 0.6 MG tablet Take 0.6 mg by mouth daily.    . ferrous sulfate 325 (65 FE) MG tablet Take 325 mg by mouth daily with breakfast.    . hydrochlorothiazide (HYDRODIURIL) 50 MG tablet Take by mouth.    . Ascorbic Acid (VITAMIN C) 1000 MG tablet Take 1,000 mg by mouth daily. Reported on 09/26/2015    . HYDROcodone-acetaminophen (NORCO/VICODIN) 5-325 MG tablet Take 1-2 tablets by mouth every 4 (four) hours as needed for moderate pain. (Patient not taking: Reported on 02/02/2016) 30 tablet 0   No current facility-administered medications for this visit.    Facility-Administered Medications Ordered in Other Visits  Medication Dose Route Frequency Provider Last Rate Last Dose  . heparin lock flush 100 unit/mL  500 Units Intravenous Once Lequita Asal, MD      . sodium chloride 0.9 % injection 10 mL  10 mL Intravenous PRN Dallas Schimke, MD   10 mL at 08/26/14 1300  . sodium chloride 0.9 % injection 10 mL  10 mL  Intracatheter PRN Lequita Asal, MD   10 mL at 03/19/15 1121  . sodium chloride flush (NS) 0.9 % injection 10 mL  10 mL Intravenous PRN Lequita Asal, MD        Allergies as of 02/02/2016 - Review Complete 02/02/2016  Allergen Reaction Noted  . No known allergies  07/26/2014    ROS:  General: Negative for anorexia, weight loss, fever, chills, fatigue, weakness. ENT: Negative for hoarseness, difficulty swallowing , nasal congestion. CV: Negative for chest pain, angina, palpitations, dyspnea on exertion, peripheral edema.  Respiratory: Negative for dyspnea at rest, dyspnea on exertion, cough, sputum, wheezing.  GI: See history of present illness. GU:  Negative for dysuria, hematuria, urinary incontinence, urinary frequency, nocturnal urination.  Endo: Negative for unusual weight change.    Physical Examination:   There were no vitals taken for this visit.  General: Well-nourished, well-developed in no acute distress.  Eyes: No icterus. Conjunctivae pink. Mouth: Oropharyngeal mucosa moist and pink , no lesions erythema or exudate. Lungs: Clear to auscultation bilaterally. Non-labored. Heart: Regular rate and rhythm, no murmurs rubs or gallops.  Abdomen: Bowel sounds are normal, nontender, nondistended, no hepatosplenomegaly or masses, no abdominal bruits or hernia , no rebound or guarding.   Extremities: No lower extremity edema. No clubbing or deformities. Neuro: Alert and oriented x 3.  Grossly intact. Skin: Warm  and dry, no jaundice.   Psych: Alert and cooperative, normal mood and affect.  Labs:    Imaging Studies: No results found.  Assessment and Plan:   [name] [name]. is a 60 y.o. y/o male who had a history of rectal cancer back in February 2016. The patient has not had a colonoscopy since resection and treated with chemotherapy and radiation. The patient will be set up for a repeat colonoscopy. The patient has been explained the plan and agrees with  it.   Note: This dictation was prepared with Dragon dictation along with smaller phrase technology. Any transcriptional errors that result from this process are unintentional.

## 2016-02-02 NOTE — Patient Instructions (Signed)
You are scheduled for a colonoscopy at [facility] on 04/16/16. You have been given instructions and a prescription for a bowel prep kit. Please contact our office with any questions.  

## 2016-02-03 ENCOUNTER — Other Ambulatory Visit: Payer: Self-pay

## 2016-03-22 DIAGNOSIS — D649 Anemia, unspecified: Secondary | ICD-10-CM | POA: Diagnosis not present

## 2016-03-22 DIAGNOSIS — E669 Obesity, unspecified: Secondary | ICD-10-CM | POA: Diagnosis not present

## 2016-03-22 DIAGNOSIS — E119 Type 2 diabetes mellitus without complications: Secondary | ICD-10-CM | POA: Diagnosis not present

## 2016-03-22 DIAGNOSIS — R7309 Other abnormal glucose: Secondary | ICD-10-CM | POA: Diagnosis not present

## 2016-03-22 DIAGNOSIS — I10 Essential (primary) hypertension: Secondary | ICD-10-CM | POA: Diagnosis not present

## 2016-03-22 DIAGNOSIS — I739 Peripheral vascular disease, unspecified: Secondary | ICD-10-CM | POA: Diagnosis not present

## 2016-03-22 DIAGNOSIS — M109 Gout, unspecified: Secondary | ICD-10-CM | POA: Diagnosis not present

## 2016-03-30 ENCOUNTER — Other Ambulatory Visit: Payer: 59

## 2016-03-30 ENCOUNTER — Ambulatory Visit: Payer: 59 | Admitting: Hematology and Oncology

## 2016-04-05 ENCOUNTER — Telehealth: Payer: Self-pay | Admitting: Gastroenterology

## 2016-04-05 NOTE — Telephone Encounter (Signed)
Patient needs to reschedule his colonoscopy to January

## 2016-04-06 NOTE — Telephone Encounter (Signed)
Pt rescheduled for colonoscopy at [facility] on 05/21/16. [place] notified.

## 2016-04-16 ENCOUNTER — Other Ambulatory Visit: Payer: Self-pay | Admitting: Nurse Practitioner

## 2016-04-22 ENCOUNTER — Inpatient Hospital Stay: Payer: 59 | Attending: Hematology and Oncology

## 2016-04-22 ENCOUNTER — Other Ambulatory Visit: Payer: Self-pay | Admitting: *Deleted

## 2016-04-22 ENCOUNTER — Inpatient Hospital Stay (HOSPITAL_BASED_OUTPATIENT_CLINIC_OR_DEPARTMENT_OTHER): Payer: 59 | Admitting: Hematology and Oncology

## 2016-04-22 ENCOUNTER — Other Ambulatory Visit: Payer: Self-pay

## 2016-04-22 ENCOUNTER — Encounter: Payer: Self-pay | Admitting: Hematology and Oncology

## 2016-04-22 VITALS — BP 132/80 | HR 62 | Temp 98.5°F | Resp 18 | Wt 230.8 lb

## 2016-04-22 DIAGNOSIS — E785 Hyperlipidemia, unspecified: Secondary | ICD-10-CM | POA: Diagnosis not present

## 2016-04-22 DIAGNOSIS — D509 Iron deficiency anemia, unspecified: Secondary | ICD-10-CM | POA: Insufficient documentation

## 2016-04-22 DIAGNOSIS — Z79899 Other long term (current) drug therapy: Secondary | ICD-10-CM

## 2016-04-22 DIAGNOSIS — C20 Malignant neoplasm of rectum: Secondary | ICD-10-CM | POA: Insufficient documentation

## 2016-04-22 DIAGNOSIS — I2510 Atherosclerotic heart disease of native coronary artery without angina pectoris: Secondary | ICD-10-CM | POA: Diagnosis not present

## 2016-04-22 DIAGNOSIS — M109 Gout, unspecified: Secondary | ICD-10-CM | POA: Diagnosis not present

## 2016-04-22 DIAGNOSIS — Z923 Personal history of irradiation: Secondary | ICD-10-CM | POA: Diagnosis not present

## 2016-04-22 DIAGNOSIS — Z87891 Personal history of nicotine dependence: Secondary | ICD-10-CM | POA: Diagnosis not present

## 2016-04-22 DIAGNOSIS — Z9221 Personal history of antineoplastic chemotherapy: Secondary | ICD-10-CM | POA: Insufficient documentation

## 2016-04-22 DIAGNOSIS — Z87442 Personal history of urinary calculi: Secondary | ICD-10-CM | POA: Insufficient documentation

## 2016-04-22 DIAGNOSIS — I252 Old myocardial infarction: Secondary | ICD-10-CM | POA: Insufficient documentation

## 2016-04-22 DIAGNOSIS — Z7982 Long term (current) use of aspirin: Secondary | ICD-10-CM | POA: Insufficient documentation

## 2016-04-22 DIAGNOSIS — K5900 Constipation, unspecified: Secondary | ICD-10-CM | POA: Diagnosis not present

## 2016-04-22 DIAGNOSIS — I739 Peripheral vascular disease, unspecified: Secondary | ICD-10-CM | POA: Insufficient documentation

## 2016-04-22 DIAGNOSIS — Z955 Presence of coronary angioplasty implant and graft: Secondary | ICD-10-CM | POA: Diagnosis not present

## 2016-04-22 DIAGNOSIS — I129 Hypertensive chronic kidney disease with stage 1 through stage 4 chronic kidney disease, or unspecified chronic kidney disease: Secondary | ICD-10-CM | POA: Insufficient documentation

## 2016-04-22 DIAGNOSIS — N189 Chronic kidney disease, unspecified: Secondary | ICD-10-CM | POA: Diagnosis not present

## 2016-04-22 LAB — CBC WITH DIFFERENTIAL/PLATELET
Basophils Absolute: 0 10*3/uL (ref 0–0.1)
Basophils Relative: 1 %
Eosinophils Absolute: 0.2 10*3/uL (ref 0–0.7)
Eosinophils Relative: 3 %
HCT: 42.9 % (ref 40.0–52.0)
Hemoglobin: 14 g/dL (ref 13.0–18.0)
Lymphocytes Relative: 14 %
Lymphs Abs: 0.8 10*3/uL — ABNORMAL LOW (ref 1.0–3.6)
MCH: 26 pg (ref 26.0–34.0)
MCHC: 32.7 g/dL (ref 32.0–36.0)
MCV: 79.7 fL — ABNORMAL LOW (ref 80.0–100.0)
Monocytes Absolute: 0.3 10*3/uL (ref 0.2–1.0)
Monocytes Relative: 6 %
Neutro Abs: 4 10*3/uL (ref 1.4–6.5)
Neutrophils Relative %: 76 %
Platelets: 212 10*3/uL (ref 150–440)
RBC: 5.38 MIL/uL (ref 4.40–5.90)
RDW: 16.1 % — ABNORMAL HIGH (ref 11.5–14.5)
WBC: 5.3 10*3/uL (ref 3.8–10.6)

## 2016-04-22 LAB — COMPREHENSIVE METABOLIC PANEL
ALT: 55 U/L (ref 17–63)
AST: 37 U/L (ref 15–41)
Albumin: 4.3 g/dL (ref 3.5–5.0)
Alkaline Phosphatase: 102 U/L (ref 38–126)
Anion gap: 7 (ref 5–15)
BUN: 18 mg/dL (ref 6–20)
CO2: 30 mmol/L (ref 22–32)
Calcium: 9.6 mg/dL (ref 8.9–10.3)
Chloride: 102 mmol/L (ref 101–111)
Creatinine, Ser: 1.36 mg/dL — ABNORMAL HIGH (ref 0.61–1.24)
GFR calc Af Amer: >60 mL/min (ref >60)
GFR calc non Af Amer: 54 mL/min — ABNORMAL LOW (ref >60)
Glucose, Bld: 161 mg/dL — ABNORMAL HIGH (ref 65–99)
Potassium: 3.3 mmol/L — ABNORMAL LOW (ref 3.5–5.1)
Sodium: 139 mmol/L (ref 135–145)
Total Bilirubin: 0.6 mg/dL (ref 0.3–1.2)
Total Protein: 7.3 g/dL (ref 6.5–8.1)

## 2016-04-22 LAB — FERRITIN: Ferritin: 52 ng/mL (ref 24–336)

## 2016-04-22 NOTE — Progress Notes (Signed)
Patient offers no complaints today. 

## 2016-04-22 NOTE — Progress Notes (Signed)
[facility] day:  04/22/16   Chief Complaint: YOUSOF ALDERMAN [name] is a 61 y.o. male with clinical stage IIA rectal cancer s/p neoadjuvant chemotherapy and radiation followed by low anterior resection who is seen for 4 month assessment.  HPI:  The patient was last seen in the medical oncology clinic on 12/29/2015.  At that time, he had ongoing issues with his peripheral vascular disease.  Hematocrit was 40.4.  CEA was 2.8.  Ferritin was 35.  He was on oral iron.  He is scheduled for colonoscopy on 05/21/2016.  Symptomatically, he states that he is "still above ground". His legs feel about the same. They swell if he exerts himself. He remains on oral iron. He takes MiraLAX for constipation.   Past Medical History:  Diagnosis Date  . Atherosclerosis   . Cancer ([place])   . Chronic kidney disease    kidney stones, UTI  . Colon cancer ([place])   . Coronary artery disease   . Gout   . Hematuria   . Hyperlipidemia   . Hypertension   . Iron deficiency anemia   . Myocardial infarction 2005  . Peripheral vascular disease ([place])   . Pulmonary nodules   . Rectal cancer ([place])   . Ureter, stricture     Past Surgical History:  Procedure Laterality Date  . BOWEL RESECTION N/A 10/23/2014   Procedure: LOW ANTERIOR BOWEL RESECTION;  Surgeon: [name] Rad, MD;  Location: [facility];  Service: General;  Laterality: N/A;  . COLON SURGERY    . COLONOSCOPY 06/06/14    . COLONOSCOPY WITH ESOPHAGOGASTRODUODENOSCOPY (EGD)    . CORONARY ANGIOPLASTY WITH STENT PLACEMENT  03/2004  . CYSTOSCOPY WITH STENT PLACEMENT Bilateral 10/23/2014   Procedure: CYSTOSCOPY WITH STENT PLACEMENT,URETHRAL DILATION, LEFT RETROGRADE PYELOGRAM, URETEROSCOPY;  Surgeon: [name] Espy, MD;  Location: [facility];  Service: Urology;  Laterality: Bilateral;  . DIVERTING ILEOSTOMY N/A 10/23/2014   Procedure: DIVERTING ILEOSTOMY;  Surgeon: [name] Rad, MD;  Location: [facility];  Service: General;   Laterality: N/A;  . ILEOSTOMY CLOSURE N/A 09/08/2015   Procedure: ILEOSTOMY TAKEDOWN;  Surgeon: [name] Pert, MD;  Location: [facility];  Service: General;  Laterality: N/A;  . LAPAROTOMY N/A 10/23/2014   Procedure: EXPLORATORY LAPAROTOMY;  Surgeon: [name] Rad, MD;  Location: [facility];  Service: General;  Laterality: N/A;  . PERIPHERAL VASCULAR CATHETERIZATION N/A 05/12/2015   Procedure: Abdominal Aortogram w/Lower Extremity;  Surgeon: [name] Huxley, MD;  Location: [facility];  Service: Cardiovascular;  Laterality: N/A;  . PERIPHERAL VASCULAR CATHETERIZATION  05/12/2015   Procedure: Lower Extremity Intervention;  Surgeon: [name] Huxley, MD;  Location: [facility];  Service: Cardiovascular;;  . PERIPHERAL VASCULAR CATHETERIZATION N/A 06/30/2015   Procedure: Abdominal Aortogram w/Lower Extremity;  Surgeon: [name] Huxley, MD;  Location: [facility];  Service: Cardiovascular;  Laterality: N/A;  . PERIPHERAL VASCULAR CATHETERIZATION  06/30/2015   Procedure: Lower Extremity Intervention;  Surgeon: [name] Huxley, MD;  Location: [facility];  Service: Cardiovascular;;  . PORT-A-CATH REMOVAL Right 09/08/2015   Procedure: REMOVAL PORT-A-CATH;  Surgeon: [name] Pert, MD;  Location: [facility];  Service: General;  Laterality: Right;  . PORTACATH PLACEMENT  07/01/2014   Dr. Marina [name]    Family History  Problem Relation Age of Onset  . Hypertension Brother   . Hypertension Father   . Diabetes Father   . Diabetes Brother   . Hypertension Brother   . Heart disease  Paternal Grandfather     Social History:  reports that he quit smoking about 31 years ago. His smoking use included Cigarettes. He has never used smokeless tobacco. He reports that he does not drink alcohol or use drugs.  He lives in [place].  He is working on disability.  The patient is accompanied by his wife today.  Allergies:  Allergies  Allergen Reactions  . No Known Allergies     Current Medications: Current  Outpatient Prescriptions  Medication Sig Dispense Refill  . acetaminophen (TYLENOL) 500 MG tablet Take 500 mg by mouth every 6 (six) hours as needed.    Marland Kitchen apixaban (ELIQUIS) 5 MG TABS tablet Take 1 tablet (5 mg total) by mouth 2 (two) times daily. 60 tablet 2  . Ascorbic Acid (VITAMIN C) 1000 MG tablet Take 1,000 mg by mouth daily. Reported on 09/26/2015    . aspirin EC 81 MG tablet Take 81 mg by mouth daily.     . cloNIDine (CATAPRES) 0.2 MG tablet Take 0.2 mg by mouth daily.    . colchicine 0.6 MG tablet Take 0.6 mg by mouth daily.    . ferrous sulfate 325 (65 FE) MG tablet Take 325 mg by mouth daily with breakfast.    . hydrochlorothiazide (HYDRODIURIL) 50 MG tablet Take by mouth.    Marland Kitchen HYDROcodone-acetaminophen (NORCO/VICODIN) 5-325 MG tablet Take 1-2 tablets by mouth every 4 (four) hours as needed for moderate pain. (Patient not taking: Reported on 04/22/2016) 30 tablet 0   No current facility-administered medications for this visit.    Facility-Administered Medications Ordered in Other Visits  Medication Dose Route Frequency Provider Last Rate Last Dose  . heparin lock flush 100 unit/mL  500 Units Intravenous Once [name] Asal, MD      . sodium chloride 0.9 % injection 10 mL  10 mL Intravenous PRN Dallas Schimke, MD   10 mL at 08/26/14 1300  . sodium chloride flush (NS) 0.9 % injection 10 mL  10 mL Intravenous PRN [name] Asal, MD        Review of Systems:  GENERAL:  "Still above ground".   No fevers, sweats.  Weight stable. PERFORMANCE STATUS (ECOG):  2 HEENT:  No visual changes, runny nose, sore throat, mouth sores or tenderness. Lungs: No shortness of breath or cough.  No hemoptysis. Cardiac:  No chest pain, palpitations, orthopnea, or PND. GI:  Constipation, takes Miralax.  No nausea, vomiting, diarrhea,melena or hematochezia. GU:  No urgency, frequency, dysuria, and intermittent hematuria. Musculoskeletal:  No back pain.  No joint pain.  No muscle  tenderness. Extremities:  Peripheral vascular disease s/p angioplasty and stent.  Left foot/leg swelling is exerts self. Skin:  No rashes or skin changes. Neuro: No headache, numbness or weakness, balance or coordination issues. Endocrine:  No diabetes, thyroid issues, hot flashes or night sweats. Psych:  No mood changes, depression or anxiety. Review of systems:  All other systems reviewed and found to be negative.   Physical Exam: Blood pressure 132/80, pulse 62, temperature 98.5 F (36.9 C), temperature source Tympanic, resp. rate 18, weight 230 lb 13.2 oz (104.7 kg).  GENERAL:  Well developed, well nourished, heavyset gentleman sitting comfortably in the exam room in no acute distress.  He has a cane at his side. MENTAL STATUS:  Alert and oriented to person, place and time. HEAD:  Wearing a black cap.  Brown hair.  Thin mustache.  Normocephalic, atraumatic, face symmetric, no Cushingoid features. EYES:  Brown eyes.  Pupils equal round and reactive to light and accomodation.  No conjunctivitis or scleral icterus. ENT:  Oropharynx clear without lesion.  Tongue normal. Mucous membranes moist.  RESPIRATORY:  Clear to auscultation without rales, wheezes or rhonchi. CARDIOVASCULAR:  Regular rate and rhythm without murmur, rub or gallop. ABDOMEN:  Well healed old ostomy site.  Soft, non-tender with active bowel sounds, and no hepatosplenomegaly.  No masses. SKIN:  No rashes, ulcers or lesions. EXTREMITIES:  Nail beds dark post chemo.  No skin discoloration.  No palpable cords. LYMPH NODES: No palpable cervical, supraclavicular, axillary or inguinal adenopathy  NEUROLOGICAL: Unremarkable. PSYCH:  Appropriate.    Orders Only on 04/22/2016  Component Date Value Ref Range Status  . WBC 04/22/2016 5.3  3.8 - 10.6 K/uL Final  . RBC 04/22/2016 5.38  4.40 - 5.90 MIL/uL Final  . Hemoglobin 04/22/2016 14.0  13.0 - 18.0 g/dL Final  . HCT 04/22/2016 42.9  40.0 - 52.0 % Final  . MCV 04/22/2016 79.7*  80.0 - 100.0 fL Final  . MCH 04/22/2016 26.0  26.0 - 34.0 pg Final  . MCHC 04/22/2016 32.7  32.0 - 36.0 g/dL Final  . RDW 04/22/2016 16.1* 11.5 - 14.5 % Final  . Platelets 04/22/2016 212  150 - 440 K/uL Final  . Neutrophils Relative % 04/22/2016 76  % Final  . Neutro Abs 04/22/2016 4.0  1.4 - 6.5 K/uL Final  . Lymphocytes Relative 04/22/2016 14  % Final  . Lymphs Abs 04/22/2016 0.8* 1.0 - 3.6 K/uL Final  . Monocytes Relative 04/22/2016 6  % Final  . Monocytes Absolute 04/22/2016 0.3  0.2 - 1.0 K/uL Final  . Eosinophils Relative 04/22/2016 3  % Final  . Eosinophils Absolute 04/22/2016 0.2  0 - 0.7 K/uL Final  . Basophils Relative 04/22/2016 1  % Final  . Basophils Absolute 04/22/2016 0.0  0 - 0.1 K/uL Final  . Sodium 04/22/2016 139  135 - 145 mmol/L Final  . Potassium 04/22/2016 3.3* 3.5 - 5.1 mmol/L Final  . Chloride 04/22/2016 102  101 - 111 mmol/L Final  . CO2 04/22/2016 30  22 - 32 mmol/L Final  . Glucose, Bld 04/22/2016 161* 65 - 99 mg/dL Final  . BUN 04/22/2016 18  6 - 20 mg/dL Final  . Creatinine, Ser 04/22/2016 1.36* 0.61 - 1.24 mg/dL Final  . Calcium 04/22/2016 9.6  8.9 - 10.3 mg/dL Final  . Total Protein 04/22/2016 7.3  6.5 - 8.1 g/dL Final  . Albumin 04/22/2016 4.3  3.5 - 5.0 g/dL Final  . AST 04/22/2016 37  15 - 41 U/L Final  . ALT 04/22/2016 55  17 - 63 U/L Final  . Alkaline Phosphatase 04/22/2016 102  38 - 126 U/L Final  . Total Bilirubin 04/22/2016 0.6  0.3 - 1.2 mg/dL Final  . GFR calc non Af Amer 04/22/2016 54* >60 mL/min Final  . GFR calc Af Amer 04/22/2016 >60  >60 mL/min Final   Comment: (NOTE) The eGFR has been calculated using the CKD EPI equation. This calculation has not been validated in all clinical situations. eGFR's persistently <60 mL/min signify possible Chronic Kidney Disease.   . Anion gap 04/22/2016 7  5 - 15 Final  . Ferritin 04/22/2016 52  24 - 336 ng/mL Final    Assessment:  [name] Kehr [name] is a 61 y.o. male with clinical stage IIA  (T3N0) rectal cancer.  Colonoscopy on 06/06/2014 revealed a large non-circumferential distal rectal mass. Biopsy was positive for adenocarcinoma.  CT scans revealed a 3 mm pulmonary nodule (indeterminate). There was a large rectal mass suspicious for transmural extension into the mesorectum.There was equivocal perirectal and sigmoid mesocolon nodes. There was no liver metastasis or extrapelvic disease.  Endoscopic ultrasound at [facility] revealed a 70% circumferential mass at 9-14 cm from the anal verge and measuring 9 mm in maximal thickness. Ultrasound suggested breakthrough of the muscularis propria with invasion into the perirectal fat.  He received neoadjuvant chemotherapy (continuous infusion 5FU) and radiation. He received radiation from 07/03/2014 until 08/16/2014. He underwent low anterior resection with diverting loop ileostomy on 10/23/2014.  Pathology revealed a complete response.  Zero of 10 lymph nodes were positive.  He underwent ileostomy take-down on 09/08/2015.  He has iron deficiency anemia.  Ferritin was 34 on 10/04/2014.  He is on oral iron (1 tablet/day).    He received 8 cycles of FOLFOX chemotherapy (12/27/2014 - 04/15/2015).  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    CEA has been followed:  2.1 on 08/26/2014, 3.6 on 04/15/2015, 2.5 on 06/23/2015, 3.2 on 09/26/2015, 2.8 on 12/29/2015, and 2.7 on 04/22/2016.  He has significant peripheral vascular disease. He underwent angioplasty of the left mid to distal posterior tibial artery, above-knee popliteal artery and entire SFA on 05/12/2015.  He underwent stent 2 to the left SFA and popliteal artery for multiple areas of greater than 50% residual stenosis after angioplasty.    Symptomatically, he has chronic issues secondary to peripheral vascular disease.  Hematocrit is 42.9.  Ferritin is 52.  Plan: 1.  Labs today:  CBC with diff, CMP, CEA, ferritin. 2.  Continue oral iron with orange  juice or vitamin C until ferritin 100. 3.  Discuss management of constipation. 4.  Anticipate follow-up colonoscopy. 5.  Chest, abdomen, and pelvic CT scan:  surveillance rectal cancer 6.  RTC in 4 months for MD assessment, labs (CBC with diff, CMP, ferritin, and CEA), and review of CT scans.   [name] Asal, MD  04/22/2016, 2:44 PM

## 2016-04-23 LAB — CEA: CEA: 2.7 ng/mL (ref 0.0–4.7)

## 2016-05-11 DIAGNOSIS — I209 Angina pectoris, unspecified: Secondary | ICD-10-CM | POA: Diagnosis not present

## 2016-05-11 DIAGNOSIS — I2510 Atherosclerotic heart disease of native coronary artery without angina pectoris: Secondary | ICD-10-CM | POA: Diagnosis not present

## 2016-05-11 DIAGNOSIS — R0602 Shortness of breath: Secondary | ICD-10-CM | POA: Diagnosis not present

## 2016-05-11 DIAGNOSIS — Z8679 Personal history of other diseases of the circulatory system: Secondary | ICD-10-CM | POA: Diagnosis not present

## 2016-05-12 ENCOUNTER — Telehealth: Payer: Self-pay | Admitting: Gastroenterology

## 2016-05-12 NOTE — Telephone Encounter (Signed)
Patient needs to reschedule his colonoscopy that is scheduled for 2/2.  He is looking for last Friday in March.  I didn't call anyone since this is a reschedule.

## 2016-05-13 NOTE — Telephone Encounter (Signed)
Pt has been rescheduled to March 30th. New paperwork and rx resent.

## 2016-05-24 ENCOUNTER — Ambulatory Visit (INDEPENDENT_AMBULATORY_CARE_PROVIDER_SITE_OTHER): Payer: Self-pay | Admitting: Vascular Surgery

## 2016-05-24 ENCOUNTER — Encounter (INDEPENDENT_AMBULATORY_CARE_PROVIDER_SITE_OTHER): Payer: Self-pay

## 2016-05-27 ENCOUNTER — Encounter (INDEPENDENT_AMBULATORY_CARE_PROVIDER_SITE_OTHER): Payer: 59

## 2016-05-27 ENCOUNTER — Ambulatory Visit (INDEPENDENT_AMBULATORY_CARE_PROVIDER_SITE_OTHER): Payer: Self-pay | Admitting: Vascular Surgery

## 2016-06-29 ENCOUNTER — Other Ambulatory Visit: Payer: Self-pay | Admitting: Internal Medicine

## 2016-06-29 DIAGNOSIS — Z8679 Personal history of other diseases of the circulatory system: Secondary | ICD-10-CM

## 2016-06-29 DIAGNOSIS — R0602 Shortness of breath: Secondary | ICD-10-CM

## 2016-06-29 DIAGNOSIS — I25119 Atherosclerotic heart disease of native coronary artery with unspecified angina pectoris: Secondary | ICD-10-CM

## 2016-06-29 DIAGNOSIS — I2510 Atherosclerotic heart disease of native coronary artery without angina pectoris: Secondary | ICD-10-CM

## 2016-07-05 ENCOUNTER — Ambulatory Visit: Payer: 59

## 2016-08-02 ENCOUNTER — Ambulatory Visit (INDEPENDENT_AMBULATORY_CARE_PROVIDER_SITE_OTHER): Payer: Self-pay | Admitting: Vascular Surgery

## 2016-08-02 ENCOUNTER — Encounter (INDEPENDENT_AMBULATORY_CARE_PROVIDER_SITE_OTHER): Payer: 59

## 2016-08-09 NOTE — Discharge Instructions (Signed)

## 2016-08-16 ENCOUNTER — Other Ambulatory Visit: Payer: Self-pay | Admitting: Hematology and Oncology

## 2016-08-16 DIAGNOSIS — C20 Malignant neoplasm of rectum: Secondary | ICD-10-CM

## 2016-08-17 ENCOUNTER — Ambulatory Visit: Admission: RE | Admit: 2016-08-17 | Payer: 59 | Source: Ambulatory Visit

## 2016-08-18 ENCOUNTER — Other Ambulatory Visit: Payer: Self-pay | Admitting: *Deleted

## 2016-08-19 ENCOUNTER — Inpatient Hospital Stay: Payer: 59 | Admitting: Hematology and Oncology

## 2016-08-19 ENCOUNTER — Inpatient Hospital Stay: Payer: 59

## 2016-08-19 ENCOUNTER — Other Ambulatory Visit: Payer: Self-pay | Admitting: Hematology and Oncology

## 2016-08-19 DIAGNOSIS — C20 Malignant neoplasm of rectum: Secondary | ICD-10-CM

## 2016-08-20 ENCOUNTER — Other Ambulatory Visit: Payer: Self-pay | Admitting: *Deleted

## 2016-08-20 DIAGNOSIS — C20 Malignant neoplasm of rectum: Secondary | ICD-10-CM

## 2016-08-25 ENCOUNTER — Ambulatory Visit
Admission: RE | Admit: 2016-08-25 | Discharge: 2016-08-25 | Disposition: A | Discharge status: Home / Self Care | Payer: 59 | Source: Ambulatory Visit | Attending: Hematology and Oncology | Admitting: Hematology and Oncology

## 2016-08-25 ENCOUNTER — Inpatient Hospital Stay: Payer: 59 | Attending: Hematology and Oncology

## 2016-08-25 DIAGNOSIS — I252 Old myocardial infarction: Secondary | ICD-10-CM | POA: Diagnosis not present

## 2016-08-25 DIAGNOSIS — Z9221 Personal history of antineoplastic chemotherapy: Secondary | ICD-10-CM | POA: Insufficient documentation

## 2016-08-25 DIAGNOSIS — Z955 Presence of coronary angioplasty implant and graft: Secondary | ICD-10-CM | POA: Diagnosis not present

## 2016-08-25 DIAGNOSIS — Z7982 Long term (current) use of aspirin: Secondary | ICD-10-CM | POA: Diagnosis not present

## 2016-08-25 DIAGNOSIS — D509 Iron deficiency anemia, unspecified: Secondary | ICD-10-CM | POA: Insufficient documentation

## 2016-08-25 DIAGNOSIS — N189 Chronic kidney disease, unspecified: Secondary | ICD-10-CM | POA: Insufficient documentation

## 2016-08-25 DIAGNOSIS — Z87442 Personal history of urinary calculi: Secondary | ICD-10-CM | POA: Insufficient documentation

## 2016-08-25 DIAGNOSIS — K439 Ventral hernia without obstruction or gangrene: Secondary | ICD-10-CM | POA: Diagnosis not present

## 2016-08-25 DIAGNOSIS — I2510 Atherosclerotic heart disease of native coronary artery without angina pectoris: Secondary | ICD-10-CM | POA: Diagnosis not present

## 2016-08-25 DIAGNOSIS — I739 Peripheral vascular disease, unspecified: Secondary | ICD-10-CM | POA: Insufficient documentation

## 2016-08-25 DIAGNOSIS — R7989 Other specified abnormal findings of blood chemistry: Secondary | ICD-10-CM | POA: Insufficient documentation

## 2016-08-25 DIAGNOSIS — Z923 Personal history of irradiation: Secondary | ICD-10-CM | POA: Diagnosis not present

## 2016-08-25 DIAGNOSIS — I700 Atherosclerosis of aorta: Secondary | ICD-10-CM | POA: Diagnosis not present

## 2016-08-25 DIAGNOSIS — I129 Hypertensive chronic kidney disease with stage 1 through stage 4 chronic kidney disease, or unspecified chronic kidney disease: Secondary | ICD-10-CM | POA: Insufficient documentation

## 2016-08-25 DIAGNOSIS — E785 Hyperlipidemia, unspecified: Secondary | ICD-10-CM | POA: Diagnosis not present

## 2016-08-25 DIAGNOSIS — M109 Gout, unspecified: Secondary | ICD-10-CM | POA: Diagnosis not present

## 2016-08-25 DIAGNOSIS — R911 Solitary pulmonary nodule: Secondary | ICD-10-CM | POA: Insufficient documentation

## 2016-08-25 DIAGNOSIS — C218 Malignant neoplasm of overlapping sites of rectum, anus and anal canal: Secondary | ICD-10-CM | POA: Diagnosis not present

## 2016-08-25 DIAGNOSIS — Z87891 Personal history of nicotine dependence: Secondary | ICD-10-CM | POA: Insufficient documentation

## 2016-08-25 DIAGNOSIS — C20 Malignant neoplasm of rectum: Secondary | ICD-10-CM | POA: Diagnosis not present

## 2016-08-25 LAB — BASIC METABOLIC PANEL
Anion gap: 7 (ref 5–15)
BUN: 21 mg/dL — ABNORMAL HIGH (ref 6–20)
CO2: 28 mmol/L (ref 22–32)
Calcium: 9.5 mg/dL (ref 8.9–10.3)
Chloride: 101 mmol/L (ref 101–111)
Creatinine, Ser: 1.22 mg/dL (ref 0.61–1.24)
GFR calc Af Amer: >60 mL/min (ref >60)
GFR calc non Af Amer: >60 mL/min (ref >60)
Glucose, Bld: 188 mg/dL — ABNORMAL HIGH (ref 65–99)
Potassium: 3.8 mmol/L (ref 3.5–5.1)
Sodium: 136 mmol/L (ref 135–145)

## 2016-08-31 ENCOUNTER — Inpatient Hospital Stay: Payer: 59

## 2016-08-31 ENCOUNTER — Inpatient Hospital Stay (HOSPITAL_BASED_OUTPATIENT_CLINIC_OR_DEPARTMENT_OTHER): Payer: 59 | Admitting: Hematology and Oncology

## 2016-08-31 ENCOUNTER — Encounter: Payer: Self-pay | Admitting: Hematology and Oncology

## 2016-08-31 ENCOUNTER — Other Ambulatory Visit: Payer: Self-pay | Admitting: *Deleted

## 2016-08-31 VITALS — BP 159/91 | HR 50 | Temp 97.9°F | Wt 232.7 lb

## 2016-08-31 DIAGNOSIS — C20 Malignant neoplasm of rectum: Secondary | ICD-10-CM

## 2016-08-31 DIAGNOSIS — R911 Solitary pulmonary nodule: Secondary | ICD-10-CM | POA: Diagnosis not present

## 2016-08-31 DIAGNOSIS — R7989 Other specified abnormal findings of blood chemistry: Secondary | ICD-10-CM | POA: Diagnosis not present

## 2016-08-31 DIAGNOSIS — R945 Abnormal results of liver function studies: Secondary | ICD-10-CM

## 2016-08-31 DIAGNOSIS — D509 Iron deficiency anemia, unspecified: Secondary | ICD-10-CM | POA: Diagnosis not present

## 2016-08-31 DIAGNOSIS — Z923 Personal history of irradiation: Secondary | ICD-10-CM

## 2016-08-31 DIAGNOSIS — K6289 Other specified diseases of anus and rectum: Secondary | ICD-10-CM

## 2016-08-31 DIAGNOSIS — Z9221 Personal history of antineoplastic chemotherapy: Secondary | ICD-10-CM

## 2016-08-31 DIAGNOSIS — I739 Peripheral vascular disease, unspecified: Secondary | ICD-10-CM

## 2016-08-31 DIAGNOSIS — N189 Chronic kidney disease, unspecified: Secondary | ICD-10-CM | POA: Diagnosis not present

## 2016-08-31 DIAGNOSIS — I129 Hypertensive chronic kidney disease with stage 1 through stage 4 chronic kidney disease, or unspecified chronic kidney disease: Secondary | ICD-10-CM | POA: Diagnosis not present

## 2016-08-31 LAB — COMPREHENSIVE METABOLIC PANEL
ALT: 132 U/L — ABNORMAL HIGH (ref 17–63)
AST: 79 U/L — ABNORMAL HIGH (ref 15–41)
Albumin: 4.2 g/dL (ref 3.5–5.0)
Alkaline Phosphatase: 111 U/L (ref 38–126)
Anion gap: 6 (ref 5–15)
BUN: 19 mg/dL (ref 6–20)
CO2: 27 mmol/L (ref 22–32)
Calcium: 9.5 mg/dL (ref 8.9–10.3)
Chloride: 102 mmol/L (ref 101–111)
Creatinine, Ser: 1.18 mg/dL (ref 0.61–1.24)
GFR calc Af Amer: >60 mL/min (ref >60)
GFR calc non Af Amer: >60 mL/min (ref >60)
Glucose, Bld: 160 mg/dL — ABNORMAL HIGH (ref 65–99)
Potassium: 3.5 mmol/L (ref 3.5–5.1)
Sodium: 135 mmol/L (ref 135–145)
Total Bilirubin: 0.6 mg/dL (ref 0.3–1.2)
Total Protein: 7 g/dL (ref 6.5–8.1)

## 2016-08-31 LAB — CBC WITH DIFFERENTIAL/PLATELET
Basophils Absolute: 0 10*3/uL (ref 0–0.1)
Basophils Relative: 1 %
Eosinophils Absolute: 0.2 10*3/uL (ref 0–0.7)
Eosinophils Relative: 3 %
HCT: 41.9 % (ref 40.0–52.0)
Hemoglobin: 13.8 g/dL (ref 13.0–18.0)
Lymphocytes Relative: 16 %
Lymphs Abs: 0.8 10*3/uL — ABNORMAL LOW (ref 1.0–3.6)
MCH: 26.4 pg (ref 26.0–34.0)
MCHC: 33 g/dL (ref 32.0–36.0)
MCV: 80 fL (ref 80.0–100.0)
Monocytes Absolute: 0.3 10*3/uL (ref 0.2–1.0)
Monocytes Relative: 6 %
Neutro Abs: 3.7 10*3/uL (ref 1.4–6.5)
Neutrophils Relative %: 74 %
Platelets: 212 10*3/uL (ref 150–440)
RBC: 5.24 MIL/uL (ref 4.40–5.90)
RDW: 16 % — ABNORMAL HIGH (ref 11.5–14.5)
WBC: 5 10*3/uL (ref 3.8–10.6)

## 2016-08-31 LAB — FERRITIN: Ferritin: 134 ng/mL (ref 24–336)

## 2016-08-31 NOTE — Progress Notes (Signed)
[facility] day:  08/31/16   Chief Complaint: [name] [name] [name] is a 61 y.o. male with clinical stage IIA rectal cancer s/p neoadjuvant chemotherapy and radiation followed by low anterior resection who is seen for 4 month assessment.  HPI:  The patient was last seen in the medical oncology clinic on 04/22/2016.  At that time, he had chronic issues secondary to peripheral vascular disease.  Hematocrit was 42.9.  Ferritin was 52.  He continued oral iron.  He was scheduled for colonoscopy on 05/21/2016.  Chest, abdomen, and pelvic CT on 08/25/2016 revealed expected post treatment changes in presacral region.  There was no evidence of recurrent or metastatic carcinoma within the chest, abdomen, or pelvis.  There was rght lower quadrant and periumbilical ventral hernias containing small bowel.  There was no evidence of bowel obstruction or ischemia.  Symptomatically, he states that his legs are the same. He states the GI prep caused constipation. He is on no new medications. He does not drink alcohol. He is taking one iron pill a day with vitamin C. He sees vein and vascular in 09/2016.  Colonoscopy is scheduled for 09/2016.   Past Medical History:  Diagnosis Date  . Atherosclerosis   . Cancer ([place])   . Chronic kidney disease    kidney stones, UTI  . Colon cancer ([place])   . Coronary artery disease   . Gout   . Hematuria   . Hyperlipidemia   . Hypertension   . Iron deficiency anemia   . Myocardial infarction ([place]) 2005  . Peripheral vascular disease ([place])   . Pulmonary nodules   . Rectal cancer ([place])   . Ureter, stricture     Past Surgical History:  Procedure Laterality Date  . BOWEL RESECTION N/A 10/23/2014   Procedure: LOW ANTERIOR BOWEL RESECTION;  Surgeon: Sherri Rad, MD;  Location: [facility];  Service: General;  Laterality: N/A;  . COLON SURGERY    . COLONOSCOPY 06/06/14    . COLONOSCOPY WITH ESOPHAGOGASTRODUODENOSCOPY (EGD)    .  CORONARY ANGIOPLASTY WITH STENT PLACEMENT  03/2004  . CYSTOSCOPY WITH STENT PLACEMENT Bilateral 10/23/2014   Procedure: CYSTOSCOPY WITH STENT PLACEMENT,URETHRAL DILATION, LEFT RETROGRADE PYELOGRAM, URETEROSCOPY;  Surgeon: Hollice Espy, MD;  Location: [facility];  Service: Urology;  Laterality: Bilateral;  . DIVERTING ILEOSTOMY N/A 10/23/2014   Procedure: DIVERTING ILEOSTOMY;  Surgeon: Sherri Rad, MD;  Location: [facility];  Service: General;  Laterality: N/A;  . ILEOSTOMY CLOSURE N/A 09/08/2015   Procedure: ILEOSTOMY TAKEDOWN;  Surgeon: Clayburn Pert, MD;  Location: [facility];  Service: General;  Laterality: N/A;  . LAPAROTOMY N/A 10/23/2014   Procedure: EXPLORATORY LAPAROTOMY;  Surgeon: Sherri Rad, MD;  Location: [facility];  Service: General;  Laterality: N/A;  . PERIPHERAL VASCULAR CATHETERIZATION N/A 05/12/2015   Procedure: Abdominal Aortogram w/Lower Extremity;  Surgeon: Algernon Huxley, MD;  Location: [facility] [facility];  Service: Cardiovascular;  Laterality: N/A;  . PERIPHERAL VASCULAR CATHETERIZATION  05/12/2015   Procedure: Lower Extremity Intervention;  Surgeon: Algernon Huxley, MD;  Location: [place] [facility];  Service: Cardiovascular;;  . PERIPHERAL VASCULAR CATHETERIZATION N/A 06/30/2015   Procedure: Abdominal Aortogram w/Lower Extremity;  Surgeon: Algernon Huxley, MD;  Location: [place] [facility];  Service: Cardiovascular;  Laterality: N/A;  . PERIPHERAL VASCULAR CATHETERIZATION  06/30/2015   Procedure: Lower Extremity Intervention;  Surgeon: Algernon Huxley, MD;  Location: [place] [facility];  Service: Cardiovascular;;  . PORT-A-CATH REMOVAL Right  09/08/2015   Procedure: REMOVAL PORT-A-CATH;  Surgeon: Clayburn Pert, MD;  Location: [facility];  Service: General;  Laterality: Right;  . PORTACATH PLACEMENT  07/01/2014   Dr. Marina Gravel    Family History  Problem Relation Age of Onset  . Hypertension Brother   . Hypertension Father   . Diabetes Father   . Diabetes Brother   . Hypertension Brother   .  Heart disease Paternal Grandfather     Social History:  reports that he quit smoking about 31 years ago. His smoking use included Cigarettes. He has never used smokeless tobacco. He reports that he does not drink alcohol or use drugs.  He lives in [place].  He is working on disability.  The patient is alone today.  Allergies:  Allergies  Allergen Reactions  . No Known Allergies     Current Medications: Current Outpatient Prescriptions  Medication Sig Dispense Refill  . acetaminophen (TYLENOL) 500 MG tablet Take 500 mg by mouth every 6 (six) hours as needed.    Marland Kitchen apixaban (ELIQUIS) 5 MG TABS tablet Take 1 tablet (5 mg total) by mouth 2 (two) times daily. 60 tablet 2  . Ascorbic Acid (VITAMIN C) 1000 MG tablet Take 1,000 mg by mouth daily. Reported on 09/26/2015    . aspirin EC 81 MG tablet Take 81 mg by mouth daily.     . cloNIDine (CATAPRES) 0.2 MG tablet Take 0.2 mg by mouth daily.    . clopidogrel (PLAVIX) 75 MG tablet Take by mouth.    . colchicine 0.6 MG tablet Take 0.6 mg by mouth daily.    . ferrous sulfate 325 (65 FE) MG tablet Take 325 mg by mouth daily with breakfast.    . hydrochlorothiazide (HYDRODIURIL) 50 MG tablet Take by mouth.    Marland Kitchen HYDROcodone-acetaminophen (NORCO/VICODIN) 5-325 MG tablet Take 1-2 tablets by mouth every 4 (four) hours as needed for moderate pain. 30 tablet 0   No current facility-administered medications for this visit.    Facility-Administered Medications Ordered in Other Visits  Medication Dose Route Frequency Provider Last Rate Last Dose  . heparin lock flush 100 unit/mL  500 Units Intravenous Once Corcoran, Melissa C, MD      . sodium chloride 0.9 % injection 10 mL  10 mL Intravenous PRN Dallas Schimke, MD   10 mL at 08/26/14 1300  . sodium chloride flush (NS) 0.9 % injection 10 mL  10 mL Intravenous PRN Lequita Asal, MD        Review of Systems:  GENERAL:  Feels "the same".   No fevers, sweats.  Weight up 2 pounds. PERFORMANCE STATUS  (ECOG):  2 HEENT:  No visual changes, runny nose, sore throat, mouth sores or tenderness. Lungs: No shortness of breath or cough.  No hemoptysis. Cardiac:  No chest pain, palpitations, orthopnea, or PND. GI:  Constipation, takes Miralax (see HPI).  No nausea, vomiting, diarrhea,melena or hematochezia. GU:  No urgency, frequency, dysuria, and intermittent hematuria. Musculoskeletal:  No back pain.  No joint pain.  No muscle tenderness. Extremities:  Peripheral vascular disease s/p angioplasty and stent.  Left foot/leg swelling is exerts self. Skin:  No rashes or skin changes. Neuro: No headache, numbness or weakness, balance or coordination issues. Endocrine:  No diabetes, thyroid issues, hot flashes or night sweats. Psych:  No mood changes, depression or anxiety. Review of systems:  All other systems reviewed and found to be negative.   Physical Exam: Blood pressure (!) 159/91, pulse (!) 50, temperature  97.9 F (36.6 C), temperature source Tympanic, weight 232 lb 11.2 oz (105.6 kg).  GENERAL:  Well developed, well nourished, heavyset gentleman sitting comfortably in the exam room in no acute distress.  He has a cane at his side. MENTAL STATUS:  Alert and oriented to person, place and time. HEAD:  Wearing a cap.  Brown hair.  Thin mustache.  Normocephalic, atraumatic, face symmetric, no Cushingoid features. EYES:  Brown eyes.  Pupils equal round and reactive to light and accomodation.  No conjunctivitis or scleral icterus. ENT:  Oropharynx clear without lesion.  Tongue normal. Mucous membranes moist.  RESPIRATORY:  Clear to auscultation without rales, wheezes or rhonchi. CARDIOVASCULAR:  Regular rate and rhythm without murmur, rub or gallop. ABDOMEN:  Well healed old ostomy site.  Soft, non-tender with active bowel sounds, and no hepatosplenomegaly.  No masses. SKIN:  No rashes, ulcers or lesions. EXTREMITIES:  Nail beds dark post chemo.  No skin discoloration.  No palpable cords. LYMPH  NODES: No palpable cervical, supraclavicular, axillary or inguinal adenopathy  NEUROLOGICAL: Unremarkable. PSYCH:  Appropriate.    Appointment on 08/31/2016  Component Date Value Ref Range Status  . WBC 08/31/2016 5.0  3.8 - 10.6 K/uL Final  . RBC 08/31/2016 5.24  4.40 - 5.90 MIL/uL Final  . Hemoglobin 08/31/2016 13.8  13.0 - 18.0 g/dL Final  . HCT 08/31/2016 41.9  40.0 - 52.0 % Final  . MCV 08/31/2016 80.0  80.0 - 100.0 fL Final  . MCH 08/31/2016 26.4  26.0 - 34.0 pg Final  . MCHC 08/31/2016 33.0  32.0 - 36.0 g/dL Final  . RDW 08/31/2016 16.0* 11.5 - 14.5 % Final  . Platelets 08/31/2016 212  150 - 440 K/uL Final  . Neutrophils Relative % 08/31/2016 74  % Final  . Neutro Abs 08/31/2016 3.7  1.4 - 6.5 K/uL Final  . Lymphocytes Relative 08/31/2016 16  % Final  . Lymphs Abs 08/31/2016 0.8* 1.0 - 3.6 K/uL Final  . Monocytes Relative 08/31/2016 6  % Final  . Monocytes Absolute 08/31/2016 0.3  0.2 - 1.0 K/uL Final  . Eosinophils Relative 08/31/2016 3  % Final  . Eosinophils Absolute 08/31/2016 0.2  0 - 0.7 K/uL Final  . Basophils Relative 08/31/2016 1  % Final  . Basophils Absolute 08/31/2016 0.0  0 - 0.1 K/uL Final  . Sodium 08/31/2016 135  135 - 145 mmol/L Final  . Potassium 08/31/2016 3.5  3.5 - 5.1 mmol/L Final  . Chloride 08/31/2016 102  101 - 111 mmol/L Final  . CO2 08/31/2016 27  22 - 32 mmol/L Final  . Glucose, Bld 08/31/2016 160* 65 - 99 mg/dL Final  . BUN 08/31/2016 19  6 - 20 mg/dL Final  . Creatinine, Ser 08/31/2016 1.18  0.61 - 1.24 mg/dL Final  . Calcium 08/31/2016 9.5  8.9 - 10.3 mg/dL Final  . Total Protein 08/31/2016 7.0  6.5 - 8.1 g/dL Final  . Albumin 08/31/2016 4.2  3.5 - 5.0 g/dL Final  . AST 08/31/2016 79* 15 - 41 U/L Final  . ALT 08/31/2016 132* 17 - 63 U/L Final  . Alkaline Phosphatase 08/31/2016 111  38 - 126 U/L Final  . Total Bilirubin 08/31/2016 0.6  0.3 - 1.2 mg/dL Final  . GFR calc non Af Amer 08/31/2016 >60  >60 mL/min Final  . GFR calc Af Amer  08/31/2016 >60  >60 mL/min Final   Comment: (NOTE) The eGFR has been calculated using the CKD EPI equation. This calculation has not  been validated in all clinical situations. eGFR's persistently <60 mL/min signify possible Chronic Kidney Disease.   Georgiann Hahn gap 08/31/2016 6  5 - 15 Final    Assessment:  [name] Kehr [name] is a 61 y.o. male with clinical stage IIA (T3N0) rectal cancer.  Colonoscopy on 06/06/2014 revealed a large non-circumferential distal rectal mass. Biopsy was positive for adenocarcinoma.   CT scans revealed a 3 mm pulmonary nodule (indeterminate). There was a large rectal mass suspicious for transmural extension into the mesorectum.There was equivocal perirectal and sigmoid mesocolon nodes. There was no liver metastasis or extrapelvic disease.  [facility]oscopic [facility] revealed a 70% circumferential mass at 9-14 cm from the anal verge and measuring 9 mm in maximal thickness. Ultrasound suggested breakthrough of the muscularis propria with invasion into the perirectal fat.  He received neoadjuvant chemotherapy (continuous infusion 5FU) and radiation. He received radiation from 07/03/2014 until 08/16/2014. He underwent low anterior resection with diverting loop ileostomy on 10/23/2014.  Pathology revealed a complete response.  Zero of 10 lymph nodes were positive.  He underwent ileostomy take-down on 09/08/2015.  He has iron deficiency anemia.  Ferritin was 34 on 10/04/2014.  He is on oral iron (1 tablet/day).    He received 8 cycles of FOLFOX chemotherapy (12/27/2014 - 04/15/2015).  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    CEA has been followed:  2.1 on 08/26/2014, 3.6 on 04/15/2015, 2.5 on 06/23/2015, 3.2 on 09/26/2015, 2.8 on 12/29/2015, 2.7 on 04/22/2016, and 3.6 on 08/31/2016.  Chest, abdomen, and pelvic CT on 08/25/2016 revealed expected post treatment changes in presacral region.  There was no evidence of recurrent or  metastatic carcinoma within the chest, abdomen, or pelvis.   He has significant peripheral vascular disease. He underwent angioplasty of the left mid to distal posterior tibial artery, above-knee popliteal artery and entire SFA on 05/12/2015.  He underwent stent 2 to the left SFA and popliteal artery for multiple areas of greater than 50% residual stenosis after angioplasty.    He has increased liver function tests.  He denies any new medications.  He does not drink alcohol.  Symptomatically, he has chronic issues secondary to peripheral vascular disease.  Hematocrit is 41.9.  Ferritin is 134.  Plan: 1.  Labs today:  CBC with diff, CMP, CEA, ferritin. 2.  Review interval CT scans.  No evidence of metastatic disease. 3.  Discuss increase liver function tests.  Add hepatitis testing. 4.  Continue oral iron with orange juice or vitamin C until ferritin 100. 5.  Anticipate follow-up colonoscopy in 09/2016. 6.  RTC in 4 months for MD assessment, labs (CBC with diff, CMP, ferritin, and CEA).  Addendum:  Patient contacted to stop oral iron.  Hepatitis A, B, and C testing was negative.  Plan repeat LFTs.   Lequita Asal, MD  08/31/2016, 2:51 PM

## 2016-09-01 ENCOUNTER — Encounter: Payer: Self-pay | Admitting: *Deleted

## 2016-09-01 ENCOUNTER — Telehealth: Payer: Self-pay | Admitting: *Deleted

## 2016-09-01 ENCOUNTER — Encounter: Payer: Self-pay | Admitting: Anesthesiology

## 2016-09-01 LAB — HEPATITIS C ANTIBODY: HCV Ab: <0.1 s/co ratio (ref 0.0–0.9)

## 2016-09-01 LAB — HEPATITIS B CORE ANTIBODY, TOTAL: Hep B Core Total Ab: NEGATIVE

## 2016-09-01 LAB — HEPATITIS B SURFACE ANTIGEN: Hepatitis B Surface Ag: NEGATIVE

## 2016-09-01 LAB — HEPATITIS A ANTIBODY, IGM: Hep A IgM: NEGATIVE

## 2016-09-01 LAB — CEA: CEA: 3.6 ng/mL (ref 0.0–4.7)

## 2016-09-01 NOTE — Telephone Encounter (Signed)
Called patient and informed him of his results and that it was ordered for him to stop his oral iron supplement per dr. Mike Gip, voiced understanding.

## 2016-09-07 ENCOUNTER — Other Ambulatory Visit: Payer: Self-pay

## 2016-09-07 DIAGNOSIS — Z1211 Encounter for screening for malignant neoplasm of colon: Secondary | ICD-10-CM

## 2016-09-10 ENCOUNTER — Ambulatory Visit: Admit: 2016-09-10 | Payer: 59 | Admitting: Gastroenterology

## 2016-09-10 HISTORY — DX: Presence of dental prosthetic device (complete) (partial): Z97.2

## 2016-09-10 HISTORY — DX: Gastro-esophageal reflux disease without esophagitis: K21.9

## 2016-09-10 SURGERY — COLONOSCOPY WITH PROPOFOL
Anesthesia: Choice

## 2016-09-30 ENCOUNTER — Ambulatory Visit (INDEPENDENT_AMBULATORY_CARE_PROVIDER_SITE_OTHER): Payer: Self-pay | Admitting: Vascular Surgery

## 2016-09-30 ENCOUNTER — Encounter: Payer: Self-pay | Admitting: *Deleted

## 2016-09-30 ENCOUNTER — Encounter (INDEPENDENT_AMBULATORY_CARE_PROVIDER_SITE_OTHER): Payer: 59

## 2016-09-30 ENCOUNTER — Encounter: Payer: Self-pay | Admitting: Anesthesiology

## 2016-10-08 ENCOUNTER — Ambulatory Visit: Admit: 2016-10-08 | Payer: 59 | Admitting: Gastroenterology

## 2016-10-08 SURGERY — COLONOSCOPY WITH PROPOFOL
Anesthesia: Choice

## 2016-11-01 DIAGNOSIS — I2510 Atherosclerotic heart disease of native coronary artery without angina pectoris: Secondary | ICD-10-CM | POA: Diagnosis not present

## 2016-11-01 DIAGNOSIS — Z8679 Personal history of other diseases of the circulatory system: Secondary | ICD-10-CM | POA: Diagnosis not present

## 2016-11-01 DIAGNOSIS — I208 Other forms of angina pectoris: Secondary | ICD-10-CM | POA: Diagnosis not present

## 2016-11-01 DIAGNOSIS — R0602 Shortness of breath: Secondary | ICD-10-CM | POA: Diagnosis not present

## 2016-11-15 ENCOUNTER — Other Ambulatory Visit: Payer: Self-pay | Admitting: *Deleted

## 2016-11-15 ENCOUNTER — Encounter (INDEPENDENT_AMBULATORY_CARE_PROVIDER_SITE_OTHER): Payer: 59

## 2016-11-15 ENCOUNTER — Ambulatory Visit (INDEPENDENT_AMBULATORY_CARE_PROVIDER_SITE_OTHER): Payer: Self-pay | Admitting: Vascular Surgery

## 2016-11-15 DIAGNOSIS — C20 Malignant neoplasm of rectum: Secondary | ICD-10-CM

## 2016-11-22 ENCOUNTER — Inpatient Hospital Stay: Payer: 59 | Attending: Hematology and Oncology

## 2016-11-22 ENCOUNTER — Other Ambulatory Visit: Payer: Self-pay

## 2016-11-22 DIAGNOSIS — Z9221 Personal history of antineoplastic chemotherapy: Secondary | ICD-10-CM | POA: Insufficient documentation

## 2016-11-22 DIAGNOSIS — D509 Iron deficiency anemia, unspecified: Secondary | ICD-10-CM | POA: Diagnosis not present

## 2016-11-22 DIAGNOSIS — I739 Peripheral vascular disease, unspecified: Secondary | ICD-10-CM | POA: Insufficient documentation

## 2016-11-22 DIAGNOSIS — E785 Hyperlipidemia, unspecified: Secondary | ICD-10-CM | POA: Insufficient documentation

## 2016-11-22 DIAGNOSIS — Z923 Personal history of irradiation: Secondary | ICD-10-CM | POA: Insufficient documentation

## 2016-11-22 DIAGNOSIS — Z955 Presence of coronary angioplasty implant and graft: Secondary | ICD-10-CM | POA: Insufficient documentation

## 2016-11-22 DIAGNOSIS — R911 Solitary pulmonary nodule: Secondary | ICD-10-CM | POA: Insufficient documentation

## 2016-11-22 DIAGNOSIS — I252 Old myocardial infarction: Secondary | ICD-10-CM | POA: Insufficient documentation

## 2016-11-22 DIAGNOSIS — C20 Malignant neoplasm of rectum: Secondary | ICD-10-CM | POA: Insufficient documentation

## 2016-11-22 DIAGNOSIS — R7989 Other specified abnormal findings of blood chemistry: Secondary | ICD-10-CM | POA: Insufficient documentation

## 2016-11-22 DIAGNOSIS — Z7982 Long term (current) use of aspirin: Secondary | ICD-10-CM | POA: Diagnosis not present

## 2016-11-22 DIAGNOSIS — N189 Chronic kidney disease, unspecified: Secondary | ICD-10-CM | POA: Insufficient documentation

## 2016-11-22 DIAGNOSIS — Z79899 Other long term (current) drug therapy: Secondary | ICD-10-CM | POA: Diagnosis not present

## 2016-11-22 DIAGNOSIS — I129 Hypertensive chronic kidney disease with stage 1 through stage 4 chronic kidney disease, or unspecified chronic kidney disease: Secondary | ICD-10-CM | POA: Diagnosis not present

## 2016-11-22 DIAGNOSIS — M109 Gout, unspecified: Secondary | ICD-10-CM | POA: Diagnosis not present

## 2016-11-22 DIAGNOSIS — I2510 Atherosclerotic heart disease of native coronary artery without angina pectoris: Secondary | ICD-10-CM | POA: Diagnosis not present

## 2016-11-22 DIAGNOSIS — Z87442 Personal history of urinary calculi: Secondary | ICD-10-CM | POA: Insufficient documentation

## 2016-11-22 DIAGNOSIS — Z87891 Personal history of nicotine dependence: Secondary | ICD-10-CM | POA: Insufficient documentation

## 2016-11-22 LAB — COMPREHENSIVE METABOLIC PANEL
ALT: 106 U/L — ABNORMAL HIGH (ref 17–63)
AST: 81 U/L — ABNORMAL HIGH (ref 15–41)
Albumin: 4.1 g/dL (ref 3.5–5.0)
Alkaline Phosphatase: 109 U/L (ref 38–126)
Anion gap: 9 (ref 5–15)
BUN: 20 mg/dL (ref 6–20)
CO2: 28 mmol/L (ref 22–32)
Calcium: 9.2 mg/dL (ref 8.9–10.3)
Chloride: 101 mmol/L (ref 101–111)
Creatinine, Ser: 1.38 mg/dL — ABNORMAL HIGH (ref 0.61–1.24)
GFR calc Af Amer: >60 mL/min (ref >60)
GFR calc non Af Amer: 53 mL/min — ABNORMAL LOW (ref >60)
Glucose, Bld: 238 mg/dL — ABNORMAL HIGH (ref 65–99)
Potassium: 3.5 mmol/L (ref 3.5–5.1)
Sodium: 138 mmol/L (ref 135–145)
Total Bilirubin: 0.7 mg/dL (ref 0.3–1.2)
Total Protein: 6.8 g/dL (ref 6.5–8.1)

## 2016-11-28 ENCOUNTER — Other Ambulatory Visit: Payer: Self-pay | Admitting: Hematology and Oncology

## 2016-11-29 ENCOUNTER — Telehealth: Payer: Self-pay | Admitting: *Deleted

## 2016-11-29 NOTE — Telephone Encounter (Signed)
Called patient and LVM that LFT's are elevated.  Hepatitis negative.  Also, order in system for CT of abdomen/pelvis for May 3.  Patient did not go to that appt.  Asked if we can reschedule and have patient follow up in the clinic afterwards.  Asked patient to call back and let us know if he is in agreement with that plan.

## 2016-11-29 NOTE — Telephone Encounter (Signed)
-----   Message from Lequita Asal, MD sent at 11/28/2016  4:12 AM EDT ----- Regarding: Elevated LFTs  LFTs still elevated.  Hepatitis testing negative.  Abdomen and pelvic CT scan with contrast order in place from 05/03.  Can we schedule with follow-up?  M

## 2016-11-30 ENCOUNTER — Telehealth: Payer: Self-pay | Admitting: *Deleted

## 2016-11-30 NOTE — Telephone Encounter (Signed)
Patient called to say he had his CT scan on 08-25-16.  Do you want me to schedule him to see you?  Next appointment scheduled for 01-03-17.

## 2016-12-07 ENCOUNTER — Other Ambulatory Visit: Payer: Self-pay | Admitting: Internal Medicine

## 2016-12-07 DIAGNOSIS — I208 Other forms of angina pectoris: Secondary | ICD-10-CM

## 2016-12-07 DIAGNOSIS — R0602 Shortness of breath: Secondary | ICD-10-CM

## 2016-12-08 ENCOUNTER — Other Ambulatory Visit: Payer: Self-pay | Admitting: Internal Medicine

## 2016-12-08 ENCOUNTER — Other Ambulatory Visit (INDEPENDENT_AMBULATORY_CARE_PROVIDER_SITE_OTHER): Payer: Self-pay | Admitting: Vascular Surgery

## 2016-12-08 DIAGNOSIS — I208 Other forms of angina pectoris: Secondary | ICD-10-CM

## 2016-12-08 DIAGNOSIS — I739 Peripheral vascular disease, unspecified: Secondary | ICD-10-CM

## 2016-12-08 DIAGNOSIS — R0602 Shortness of breath: Secondary | ICD-10-CM

## 2016-12-09 ENCOUNTER — Ambulatory Visit (INDEPENDENT_AMBULATORY_CARE_PROVIDER_SITE_OTHER): Payer: 59 | Admitting: Vascular Surgery

## 2016-12-09 ENCOUNTER — Encounter (INDEPENDENT_AMBULATORY_CARE_PROVIDER_SITE_OTHER): Payer: 59

## 2016-12-13 ENCOUNTER — Ambulatory Visit
Admission: RE | Admit: 2016-12-13 | Discharge: 2016-12-13 | Disposition: A | Discharge status: Home / Self Care | Payer: 59 | Source: Ambulatory Visit | Attending: Internal Medicine | Admitting: Internal Medicine

## 2016-12-13 DIAGNOSIS — N189 Chronic kidney disease, unspecified: Secondary | ICD-10-CM | POA: Diagnosis not present

## 2016-12-13 DIAGNOSIS — R079 Chest pain, unspecified: Secondary | ICD-10-CM | POA: Diagnosis not present

## 2016-12-13 DIAGNOSIS — K219 Gastro-esophageal reflux disease without esophagitis: Secondary | ICD-10-CM | POA: Diagnosis not present

## 2016-12-13 DIAGNOSIS — I252 Old myocardial infarction: Secondary | ICD-10-CM | POA: Insufficient documentation

## 2016-12-13 DIAGNOSIS — E785 Hyperlipidemia, unspecified: Secondary | ICD-10-CM | POA: Diagnosis not present

## 2016-12-13 DIAGNOSIS — I2510 Atherosclerotic heart disease of native coronary artery without angina pectoris: Secondary | ICD-10-CM | POA: Diagnosis not present

## 2016-12-13 DIAGNOSIS — R0602 Shortness of breath: Secondary | ICD-10-CM | POA: Diagnosis not present

## 2016-12-13 DIAGNOSIS — I208 Other forms of angina pectoris: Secondary | ICD-10-CM

## 2016-12-13 DIAGNOSIS — I129 Hypertensive chronic kidney disease with stage 1 through stage 4 chronic kidney disease, or unspecified chronic kidney disease: Secondary | ICD-10-CM | POA: Insufficient documentation

## 2016-12-13 DIAGNOSIS — I739 Peripheral vascular disease, unspecified: Secondary | ICD-10-CM | POA: Insufficient documentation

## 2016-12-13 LAB — NM MYOCAR MULTI W/SPECT W/WALL MOTION / EF
CHL CUP MPHR: 157 {beats}/min
CHL CUP STRESS STAGE 1 GRADE: 0 %
CHL CUP STRESS STAGE 1 HR: 50 {beats}/min
CHL CUP STRESS STAGE 2 GRADE: 0 %
CHL CUP STRESS STAGE 2 HR: 49 {beats}/min
CHL CUP STRESS STAGE 3 GRADE: 0 %
CHL CUP STRESS STAGE 3 HR: 75 {beats}/min
CHL CUP STRESS STAGE 3 SPEED: 0 mph
CHL CUP STRESS STAGE 4 GRADE: 0 %
CHL CUP STRESS STAGE 4 HR: 79 {beats}/min
CHL CUP STRESS STAGE 5 DBP: 86 mmHg
CHL CUP STRESS STAGE 5 GRADE: 0 %
CHL CUP STRESS STAGE 5 SPEED: 0 mph
CSEPEW: 1 METS
CSEPPHR: 75 {beats}/min
Exercise duration (min): 1 min
Exercise duration (sec): 0 s
LVDIAVOL: 115 mL (ref 62–150)
LVSYSVOL: 60 mL
NUC STRESS TID: 1.54
Percent HR: 50 %
Percent of predicted max HR: 47 %
Rest HR: 52 {beats}/min
SDS: 4
SRS: 16
SSS: 11
Stage 1 Speed: 0 mph
Stage 2 Speed: 0 mph
Stage 4 Speed: 0 mph
Stage 5 HR: 66 {beats}/min
Stage 5 SBP: 134 mmHg

## 2016-12-13 MED ORDER — REGADENOSON 0.4 MG/5ML IV SOLN
0.4000 mg | Freq: Once | INTRAVENOUS | Status: AC
  Administered 2016-12-13: 0.4 mg via INTRAVENOUS

## 2016-12-13 MED ORDER — TECHNETIUM TC 99M TETROFOSMIN IV KIT
30.0000 | PACK | Freq: Once | INTRAVENOUS | Status: AC | PRN
  Administered 2016-12-13: 31.051 via INTRAVENOUS

## 2016-12-13 MED ORDER — TECHNETIUM TC 99M TETROFOSMIN IV KIT
13.0000 | PACK | Freq: Once | INTRAVENOUS | Status: AC | PRN
  Administered 2016-12-13: 12.98 via INTRAVENOUS

## 2016-12-13 NOTE — Progress Notes (Signed)
*  PRELIMINARY RESULTS* Echocardiogram 2D Echocardiogram has been performed.  Sherrie Sport 12/13/2016, 10:12 AM

## 2017-01-03 ENCOUNTER — Inpatient Hospital Stay: Payer: 59 | Attending: Hematology and Oncology | Admitting: *Deleted

## 2017-01-03 ENCOUNTER — Inpatient Hospital Stay (HOSPITAL_BASED_OUTPATIENT_CLINIC_OR_DEPARTMENT_OTHER): Payer: 59 | Admitting: Hematology and Oncology

## 2017-01-03 ENCOUNTER — Other Ambulatory Visit: Payer: Self-pay | Admitting: *Deleted

## 2017-01-03 VITALS — BP 132/84 | HR 70 | Temp 97.4°F | Resp 18 | Wt 227.0 lb

## 2017-01-03 DIAGNOSIS — D509 Iron deficiency anemia, unspecified: Secondary | ICD-10-CM | POA: Insufficient documentation

## 2017-01-03 DIAGNOSIS — I252 Old myocardial infarction: Secondary | ICD-10-CM | POA: Diagnosis not present

## 2017-01-03 DIAGNOSIS — R911 Solitary pulmonary nodule: Secondary | ICD-10-CM | POA: Diagnosis not present

## 2017-01-03 DIAGNOSIS — E785 Hyperlipidemia, unspecified: Secondary | ICD-10-CM | POA: Insufficient documentation

## 2017-01-03 DIAGNOSIS — R7989 Other specified abnormal findings of blood chemistry: Secondary | ICD-10-CM | POA: Insufficient documentation

## 2017-01-03 DIAGNOSIS — I739 Peripheral vascular disease, unspecified: Secondary | ICD-10-CM | POA: Insufficient documentation

## 2017-01-03 DIAGNOSIS — I10 Essential (primary) hypertension: Secondary | ICD-10-CM | POA: Insufficient documentation

## 2017-01-03 DIAGNOSIS — Z79899 Other long term (current) drug therapy: Secondary | ICD-10-CM | POA: Insufficient documentation

## 2017-01-03 DIAGNOSIS — I2510 Atherosclerotic heart disease of native coronary artery without angina pectoris: Secondary | ICD-10-CM | POA: Diagnosis not present

## 2017-01-03 DIAGNOSIS — K219 Gastro-esophageal reflux disease without esophagitis: Secondary | ICD-10-CM | POA: Insufficient documentation

## 2017-01-03 DIAGNOSIS — R945 Abnormal results of liver function studies: Secondary | ICD-10-CM | POA: Insufficient documentation

## 2017-01-03 DIAGNOSIS — Z85048 Personal history of other malignant neoplasm of rectum, rectosigmoid junction, and anus: Secondary | ICD-10-CM

## 2017-01-03 DIAGNOSIS — Z9221 Personal history of antineoplastic chemotherapy: Secondary | ICD-10-CM | POA: Insufficient documentation

## 2017-01-03 DIAGNOSIS — M109 Gout, unspecified: Secondary | ICD-10-CM | POA: Insufficient documentation

## 2017-01-03 DIAGNOSIS — C20 Malignant neoplasm of rectum: Secondary | ICD-10-CM

## 2017-01-03 DIAGNOSIS — K6289 Other specified diseases of anus and rectum: Secondary | ICD-10-CM

## 2017-01-03 DIAGNOSIS — R0602 Shortness of breath: Secondary | ICD-10-CM

## 2017-01-03 DIAGNOSIS — N189 Chronic kidney disease, unspecified: Secondary | ICD-10-CM | POA: Insufficient documentation

## 2017-01-03 DIAGNOSIS — Z923 Personal history of irradiation: Secondary | ICD-10-CM

## 2017-01-03 LAB — COMPREHENSIVE METABOLIC PANEL
ALT: 58 U/L (ref 17–63)
AST: 43 U/L — ABNORMAL HIGH (ref 15–41)
Albumin: 3.7 g/dL (ref 3.5–5.0)
Alkaline Phosphatase: 99 U/L (ref 38–126)
Anion gap: 12 (ref 5–15)
BUN: 18 mg/dL (ref 6–20)
CO2: 24 mmol/L (ref 22–32)
Calcium: 9.3 mg/dL (ref 8.9–10.3)
Chloride: 98 mmol/L — ABNORMAL LOW (ref 101–111)
Creatinine, Ser: 1.25 mg/dL — ABNORMAL HIGH (ref 0.61–1.24)
GFR calc Af Amer: >60 mL/min (ref >60)
GFR calc non Af Amer: 60 mL/min — ABNORMAL LOW (ref >60)
Glucose, Bld: 209 mg/dL — ABNORMAL HIGH (ref 65–99)
Potassium: 3.9 mmol/L (ref 3.5–5.1)
Sodium: 134 mmol/L — ABNORMAL LOW (ref 135–145)
Total Bilirubin: 0.4 mg/dL (ref 0.3–1.2)
Total Protein: 7 g/dL (ref 6.5–8.1)

## 2017-01-03 LAB — CBC WITH DIFFERENTIAL/PLATELET
Basophils Absolute: 0.1 10*3/uL (ref 0–0.1)
Basophils Relative: 1 %
Eosinophils Absolute: 0.1 10*3/uL (ref 0–0.7)
Eosinophils Relative: 2 %
HCT: 38.1 % — ABNORMAL LOW (ref 40.0–52.0)
Hemoglobin: 12.7 g/dL — ABNORMAL LOW (ref 13.0–18.0)
Lymphocytes Relative: 8 %
Lymphs Abs: 0.6 10*3/uL — ABNORMAL LOW (ref 1.0–3.6)
MCH: 26.2 pg (ref 26.0–34.0)
MCHC: 33.4 g/dL (ref 32.0–36.0)
MCV: 78.6 fL — ABNORMAL LOW (ref 80.0–100.0)
Monocytes Absolute: 0.4 10*3/uL (ref 0.2–1.0)
Monocytes Relative: 5 %
Neutro Abs: 6.4 10*3/uL (ref 1.4–6.5)
Neutrophils Relative %: 84 %
Platelets: 339 10*3/uL (ref 150–440)
RBC: 4.85 MIL/uL (ref 4.40–5.90)
RDW: 15 % — ABNORMAL HIGH (ref 11.5–14.5)
WBC: 7.6 10*3/uL (ref 3.8–10.6)

## 2017-01-03 LAB — FERRITIN: Ferritin: 218 ng/mL (ref 24–336)

## 2017-01-03 NOTE — Progress Notes (Signed)
[facility] day:  01/03/17   Chief Complaint: [name] BORUFF [name] is a 61 y.o. male with clinical stage IIA rectal cancer s/p neoadjuvant chemotherapy and radiation followed by low anterior resection who is seen for 4 month assessment.  HPI:  The patient was last seen in the medical oncology clinic on 08/31/2016.  At that time, he had chronic issues secondary to peripheral vascular disease.  Hematocrit was 41.9.  Ferritin was 134.  Oral iron was discontinued.  Liver function tests were elevated (AST 79, ALT 132).  CEA was 3.6.  Repeat LFTs on 11/22/2016 revealed an AST 81, ALT 106, alkaline phosphatase 108, and bilirubin 0.7.  Hepatitis B surface antigen, hepatitis B core antibody total, and hepatitis C antibody were negative on 08/31/2016.  Colonoscopy on 09/10/2016 and 10/08/2016 were cancelled by Dr. Allen Norris.  Patient recently had a stress test and is awaiting results from that prior to having the colonoscopy.   Symptomatically, he complains of chronic problems with his legs. He is also having bilateral wrist pain. He states, "I have gout". Splint in place to RIGHT wrist. Patient denies other symptoms today. Patient denies fevers, sweats, and bleeding. He denies obvious hematochezia, melena, and gross hematuria.  Appetite is good.  He eats meat daily.     Past Medical History:  Diagnosis Date  . Atherosclerosis   . Cancer ([place])   . Chronic kidney disease    kidney stones, UTI  . Colon cancer ([place])   . Coronary artery disease   . GERD (gastroesophageal reflux disease)   . Gout   . Hematuria   . Hyperlipidemia   . Hypertension   . Iron deficiency anemia   . Myocardial infarction ([place]) 2005  . Peripheral vascular disease ([place])   . Pulmonary nodules   . Rectal cancer ([place])   . Ureter, stricture   . Wears dentures    full upper    Past Surgical History:  Procedure Laterality Date  . BOWEL RESECTION N/A 10/23/2014   Procedure: LOW  ANTERIOR BOWEL RESECTION;  Surgeon: Sherri Rad, MD;  Location: [facility];  Service: General;  Laterality: N/A;  . COLON SURGERY    . COLONOSCOPY 06/06/14    . COLONOSCOPY WITH ESOPHAGOGASTRODUODENOSCOPY (EGD)    . CORONARY ANGIOPLASTY WITH STENT PLACEMENT  03/2004  . CYSTOSCOPY WITH STENT PLACEMENT Bilateral 10/23/2014   Procedure: CYSTOSCOPY WITH STENT PLACEMENT,URETHRAL DILATION, LEFT RETROGRADE PYELOGRAM, URETEROSCOPY;  Surgeon: Hollice Espy, MD;  Location: [facility];  Service: Urology;  Laterality: Bilateral;  . DIVERTING ILEOSTOMY N/A 10/23/2014   Procedure: DIVERTING ILEOSTOMY;  Surgeon: Sherri Rad, MD;  Location: [facility];  Service: General;  Laterality: N/A;  . ILEOSTOMY CLOSURE N/A 09/08/2015   Procedure: ILEOSTOMY TAKEDOWN;  Surgeon: Clayburn Pert, MD;  Location: [facility];  Service: General;  Laterality: N/A;  . LAPAROTOMY N/A 10/23/2014   Procedure: EXPLORATORY LAPAROTOMY;  Surgeon: Sherri Rad, MD;  Location: [facility];  Service: General;  Laterality: N/A;  . PERIPHERAL VASCULAR CATHETERIZATION N/A 05/12/2015   Procedure: Abdominal Aortogram w/Lower Extremity;  Surgeon: Algernon Huxley, MD;  Location: [facility];  Service: Cardiovascular;  Laterality: N/A;  . PERIPHERAL VASCULAR CATHETERIZATION  05/12/2015   Procedure: Lower Extremity Intervention;  Surgeon: Algernon Huxley, MD;  Location: [facility];  Service: Cardiovascular;;  . PERIPHERAL VASCULAR CATHETERIZATION N/A 06/30/2015   Procedure: Abdominal Aortogram w/Lower Extremity;  Surgeon: Algernon Huxley, MD;  Location: [facility];  Service: Cardiovascular;  Laterality: N/A;  . PERIPHERAL VASCULAR CATHETERIZATION  06/30/2015   Procedure: Lower Extremity Intervention;  Surgeon: Annice Needy, MD;  Location: [facility];  Service: Cardiovascular;;  . PORT-A-CATH REMOVAL Right 09/08/2015   Procedure: REMOVAL PORT-A-CATH;  Surgeon: Ricarda Frame, MD;  Location: [facility];  Service: General;  Laterality: Right;  . PORTACATH  PLACEMENT  07/01/2014   Dr. Egbert Garibaldi    Family History  Problem Relation Age of Onset  . Hypertension Brother   . Hypertension Father   . Diabetes Father   . Diabetes Brother   . Hypertension Brother   . Heart disease Paternal Grandfather     Social History:  reports that he quit smoking about 32 years ago. His smoking use included Cigarettes. He has never used smokeless tobacco. He reports that he does not drink alcohol or use drugs.  He lives in [place].  He is working on disability.  The patient is accompanied by his wife today.   Allergies:  Allergies  Allergen Reactions  . No Known Allergies     Current Medications: Current Outpatient Prescriptions  Medication Sig Dispense Refill  . aspirin EC 81 MG tablet Take 81 mg by mouth daily.     . cloNIDine (CATAPRES) 0.2 MG tablet Take 0.2 mg by mouth daily.    . clopidogrel (PLAVIX) 75 MG tablet Take by mouth.    . colchicine 0.6 MG tablet Take 0.6 mg by mouth daily.    . hydrochlorothiazide (HYDRODIURIL) 50 MG tablet Take by mouth.    Marland Kitchen acetaminophen (TYLENOL) 500 MG tablet Take 500 mg by mouth every 6 (six) hours as needed.    . Ascorbic Acid (VITAMIN C) 1000 MG tablet Take 1,000 mg by mouth daily. Reported on 09/26/2015    . ferrous sulfate 325 (65 FE) MG tablet Take 325 mg by mouth daily with breakfast.    . HYDROcodone-acetaminophen (NORCO/VICODIN) 5-325 MG tablet Take 1-2 tablets by mouth every 4 (four) hours as needed for moderate pain. (Patient not taking: Reported on 09/01/2016) 30 tablet 0   No current facility-administered medications for this visit.    Facility-Administered Medications Ordered in Other Visits  Medication Dose Route Frequency Provider Last Rate Last Dose  . heparin lock flush 100 unit/mL  500 Units Intravenous Once Corcoran, Melissa C, MD      . sodium chloride 0.9 % injection 10 mL  10 mL Intravenous PRN Marin Roberts, MD   10 mL at 08/26/14 1300  . sodium chloride flush (NS) 0.9 % injection 10 mL  10  mL Intravenous PRN Rosey Bath, MD        Review of Systems:  GENERAL:  "Doing".   No fevers, sweats.  Weight down 5 pounds. PERFORMANCE STATUS (ECOG):  2 HEENT:  No visual changes, runny nose, sore throat, mouth sores or tenderness. Lungs: No shortness of breath or cough.  No hemoptysis. Cardiac:  No chest pain, palpitations, orthopnea, or PND. GI:  Constipation, takes Miralax.  No nausea, vomiting, diarrhea,melena or hematochezia. GU:  No urgency, frequency, dysuria, and intermittent hematuria. Musculoskeletal:  No back pain.  No joint pain.  No muscle tenderness. Extremities:  Peripheral vascular disease s/p angioplasty and stent.  Left foot/leg swelling is exerts self. Skin:  Gout.  No rashes or skin changes. Neuro: No headache, numbness or weakness, balance or coordination issues. Endocrine:  No diabetes, thyroid issues, hot flashes or night sweats. Psych:  No mood changes, depression or anxiety. Review of  systems:  All other systems reviewed and found to be negative.   Physical Exam: Blood pressure 132/84, pulse 70, temperature (!) 97.4 F (36.3 C), temperature source Tympanic, resp. rate 18, weight 227 lb (103 kg).  GENERAL:  Well developed, well nourished, gentleman sitting comfortably in the exam room in no acute distress.  He has a cane at his side. MENTAL STATUS:  Alert and oriented to person, place and time. HEAD:  Wearing a cap.  Brown hair.  Thin mustache.  Normocephalic, atraumatic, face symmetric, no Cushingoid features. EYES:  Brown eyes.  Pupils equal round and reactive to light and accomodation.  No conjunctivitis or scleral icterus. ENT:  Oropharynx clear without lesion.  Tongue normal. Mucous membranes moist.  RESPIRATORY:  Clear to auscultation without rales, wheezes or rhonchi. CARDIOVASCULAR:  Regular rate and rhythm without murmur, rub or gallop. ABDOMEN:  Well healed old ostomy site.  Soft, non-tender with active bowel sounds, and no hepatosplenomegaly.   No masses. SKIN:  No rashes, ulcers or lesions. EXTREMITIES:  Right wrist brace.  Nail beds dark post chemo.  No skin discoloration.  No palpable cords. LYMPH NODES: No palpable cervical, supraclavicular, axillary or inguinal adenopathy  NEUROLOGICAL: Unremarkable. PSYCH:  Appropriate.    Clinical Support on 01/03/2017  Component Date Value Ref Range Status  . WBC 01/03/2017 7.6  3.8 - 10.6 K/uL Final  . RBC 01/03/2017 4.85  4.40 - 5.90 MIL/uL Final  . Hemoglobin 01/03/2017 12.7* 13.0 - 18.0 g/dL Final  . HCT 01/03/2017 38.1* 40.0 - 52.0 % Final  . MCV 01/03/2017 78.6* 80.0 - 100.0 fL Final  . MCH 01/03/2017 26.2  26.0 - 34.0 pg Final  . MCHC 01/03/2017 33.4  32.0 - 36.0 g/dL Final  . RDW 01/03/2017 15.0* 11.5 - 14.5 % Final  . Platelets 01/03/2017 339  150 - 440 K/uL Final  . Neutrophils Relative % 01/03/2017 84  % Final  . Neutro Abs 01/03/2017 6.4  1.4 - 6.5 K/uL Final  . Lymphocytes Relative 01/03/2017 8  % Final  . Lymphs Abs 01/03/2017 0.6* 1.0 - 3.6 K/uL Final  . Monocytes Relative 01/03/2017 5  % Final  . Monocytes Absolute 01/03/2017 0.4  0.2 - 1.0 K/uL Final  . Eosinophils Relative 01/03/2017 2  % Final  . Eosinophils Absolute 01/03/2017 0.1  0 - 0.7 K/uL Final  . Basophils Relative 01/03/2017 1  % Final  . Basophils Absolute 01/03/2017 0.1  0 - 0.1 K/uL Final  . Sodium 01/03/2017 134* 135 - 145 mmol/L Final  . Potassium 01/03/2017 3.9  3.5 - 5.1 mmol/L Final  . Chloride 01/03/2017 98* 101 - 111 mmol/L Final  . CO2 01/03/2017 24  22 - 32 mmol/L Final  . Glucose, Bld 01/03/2017 209* 65 - 99 mg/dL Final  . BUN 01/03/2017 18  6 - 20 mg/dL Final  . Creatinine, Ser 01/03/2017 1.25* 0.61 - 1.24 mg/dL Final  . Calcium 01/03/2017 9.3  8.9 - 10.3 mg/dL Final  . Total Protein 01/03/2017 7.0  6.5 - 8.1 g/dL Final  . Albumin 01/03/2017 3.7  3.5 - 5.0 g/dL Final  . AST 01/03/2017 43* 15 - 41 U/L Final  . ALT 01/03/2017 58  17 - 63 U/L Final  . Alkaline Phosphatase 01/03/2017 99   38 - 126 U/L Final  . Total Bilirubin 01/03/2017 0.4  0.3 - 1.2 mg/dL Final  . GFR calc non Af Amer 01/03/2017 60* >60 mL/min Final  . GFR calc Af Amer 01/03/2017 >60  >  60 mL/min Final   Comment: (NOTE) The eGFR has been calculated using the CKD EPI equation. This calculation has not been validated in all clinical situations. eGFR's persistently <60 mL/min signify possible Chronic Kidney Disease.   . Anion gap 01/03/2017 12  5 - 15 Final    Assessment:  [name] Kehr [name] is a 61 y.o. male with clinical stage IIA (T3N0) rectal cancer.  Colonoscopy on 06/06/2014 revealed a large non-circumferential distal rectal mass. Biopsy was positive for adenocarcinoma.   CT scans revealed a 3 mm pulmonary nodule (indeterminate). There was a large rectal mass suspicious for transmural extension into the mesorectum.There was equivocal perirectal and sigmoid mesocolon nodes. There was no liver metastasis or extrapelvic disease.  [facility]oscopic [facility] revealed a 70% circumferential mass at 9-14 cm from the anal verge and measuring 9 mm in maximal thickness. Ultrasound suggested breakthrough of the muscularis propria with invasion into the perirectal fat.  He received neoadjuvant chemotherapy (continuous infusion 5FU) and radiation. He received radiation from 07/03/2014 until 08/16/2014. He underwent low anterior resection with diverting loop ileostomy on 10/23/2014.  Pathology revealed a complete response.  Zero of 10 lymph nodes were positive.  He underwent ileostomy take-down on 09/08/2015.  He has iron deficiency anemia.  Ferritin was 34 on 10/04/2014.  He is on oral iron (1 tablet/day).    He received 8 cycles of FOLFOX chemotherapy (12/27/2014 - 04/15/2015).  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    CEA has been followed:  2.1 on 08/26/2014, 3.6 on 04/15/2015, 2.5 on 06/23/2015, 3.2 on 09/26/2015, 2.8 on 12/29/2015, 2.7 on 04/22/2016, 3.6 on  08/31/2016, and 2.8 on 01/03/2017.  Chest, abdomen, and pelvic CT on 08/25/2016 revealed expected post treatment changes in presacral region.  There was no evidence of recurrent or metastatic carcinoma within the chest, abdomen, or pelvis.   He has significant peripheral vascular disease. He underwent angioplasty of the left mid to distal posterior tibial artery, above-knee popliteal artery and entire SFA on 05/12/2015.  He underwent stent 2 to the left SFA and popliteal artery for multiple areas of greater than 50% residual stenosis after angioplasty.    He has a history of increased liver function tests.  He denies any new medications.  He does not drink alcohol.  Hepatitis B surface antigen, hepatitis B core antibody total, and hepatitis C antibody were negative on 08/31/2016.  Symptomatically, he has chronic issues secondary to peripheral vascular disease. His wrists are painful. Hematocrit decreased from 41.9 to 38.1.  LFTs have returned to baseline. CEA is normal.  Ferritin is 218.  Plan: 1.  Labs today:  CBC with diff, CMP, CEA, ferritin. 2.  Discuss hepatitis testing - negative. 3.  Discuss colonoscopy. He has not had it yet because he has to be cleared by cardiology before it can be performed.. He has had his stress test; awaiting results. 4.  Discuss anemia. Patient not currently taking iron. Restart supplemental iron with vitamin C daily. Ferritin goal is 100.  5.  RTC in 4 months for MD assessment, labs (CBC with diff, CMP, ferritin, and CEA).   Honor Loh, NP  01/03/2017, 3:01 PM   I saw and evaluated the patient, participating in the key portions of the service and reviewing pertinent diagnostic studies and records.  I reviewed the nurse practitioner's note and agree with the findings and the plan.  The assessment and plan were discussed with the patient.  A few questions were asked by  the patient and answered.   Lequita Asal, MD 01/03/2017, 3:01 PM

## 2017-01-03 NOTE — Progress Notes (Signed)
Here for follow up

## 2017-01-04 LAB — CEA: CEA: 2.8 ng/mL (ref 0.0–4.7)

## 2017-01-05 ENCOUNTER — Other Ambulatory Visit: Payer: Self-pay | Admitting: *Deleted

## 2017-01-05 DIAGNOSIS — Z85048 Personal history of other malignant neoplasm of rectum, rectosigmoid junction, and anus: Secondary | ICD-10-CM

## 2017-01-12 IMAGING — CR DG CHEST 2V
2 series · 2 of 2 positions shown · non-contrast
Comparison: 07/01/2014

CLINICAL DATA: Rectal cancer.  Check Port-A-Cath placement.

EXAM:
CHEST  2 VIEW

[chest pa]
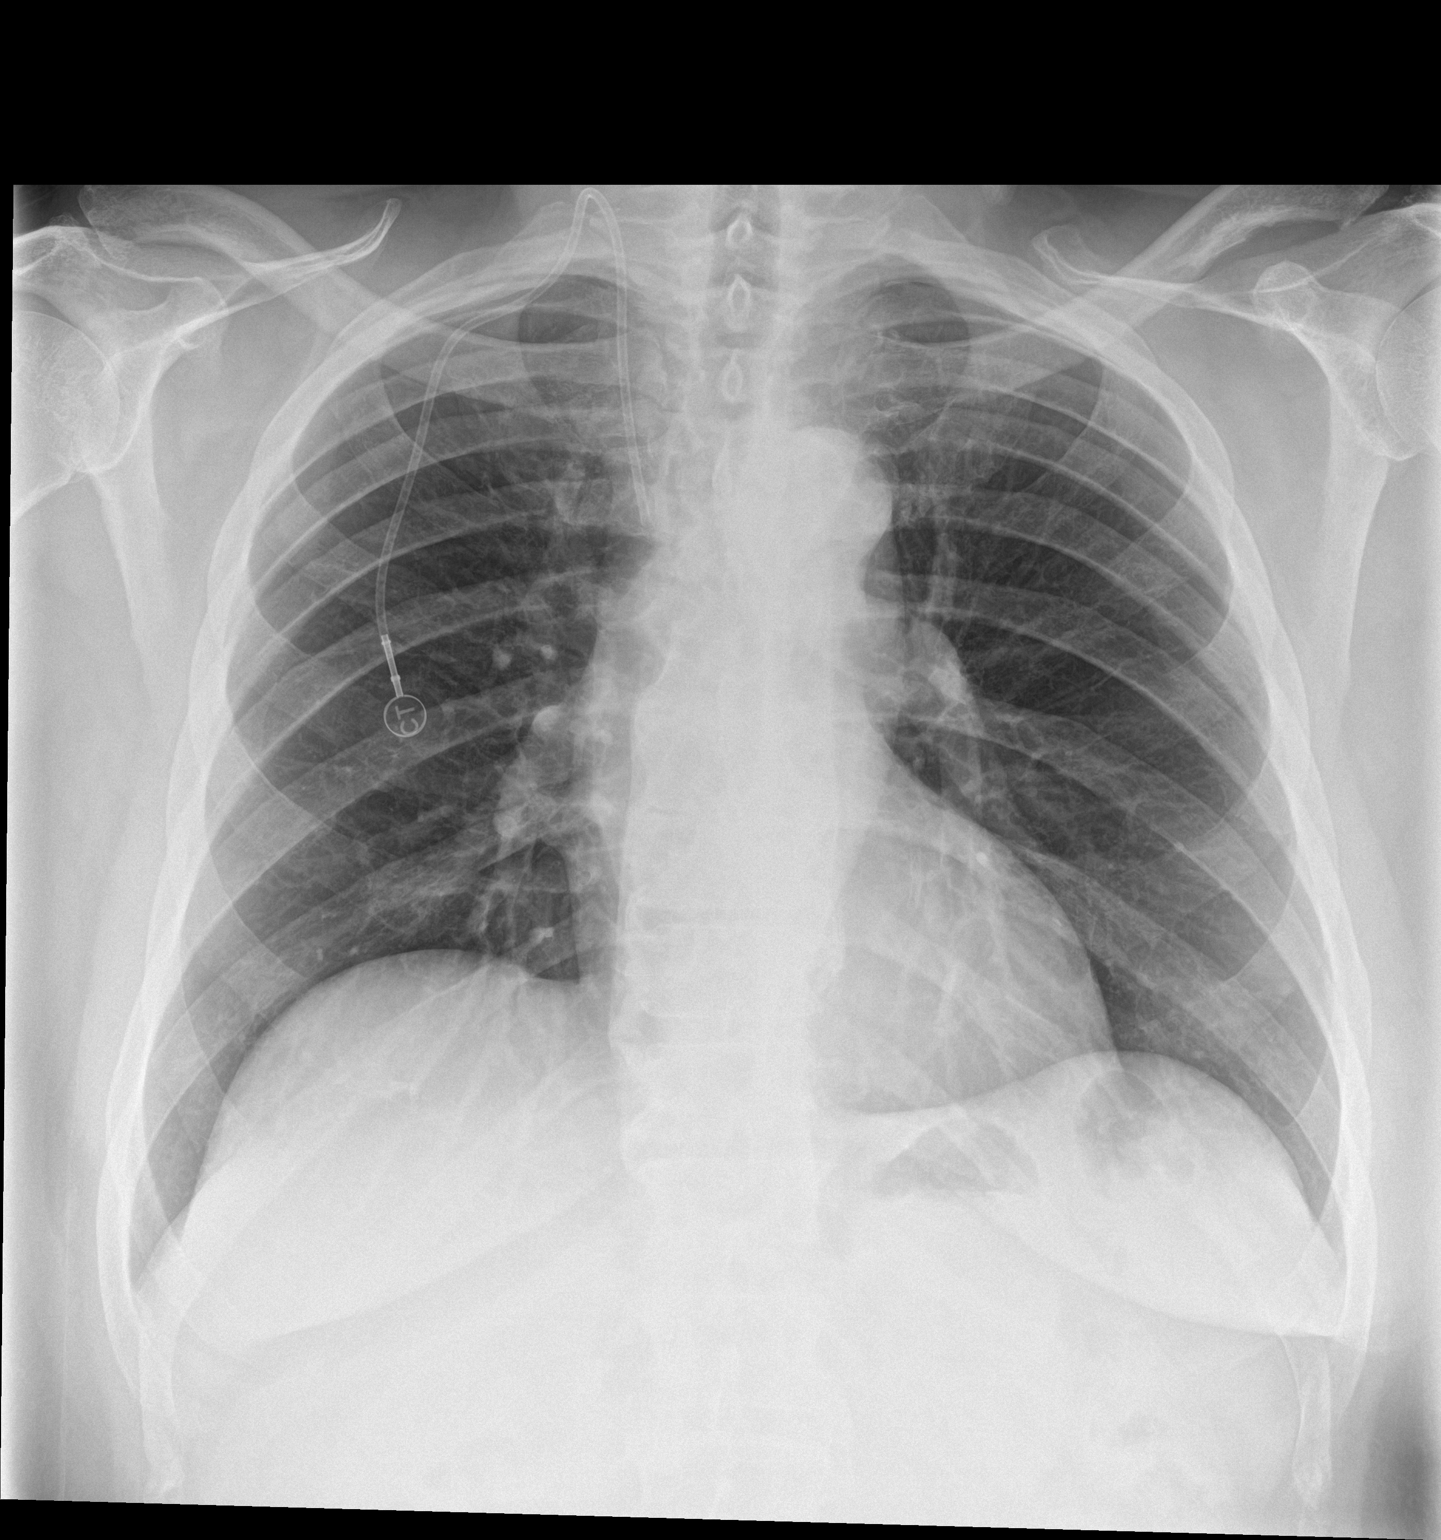

[chest lat]
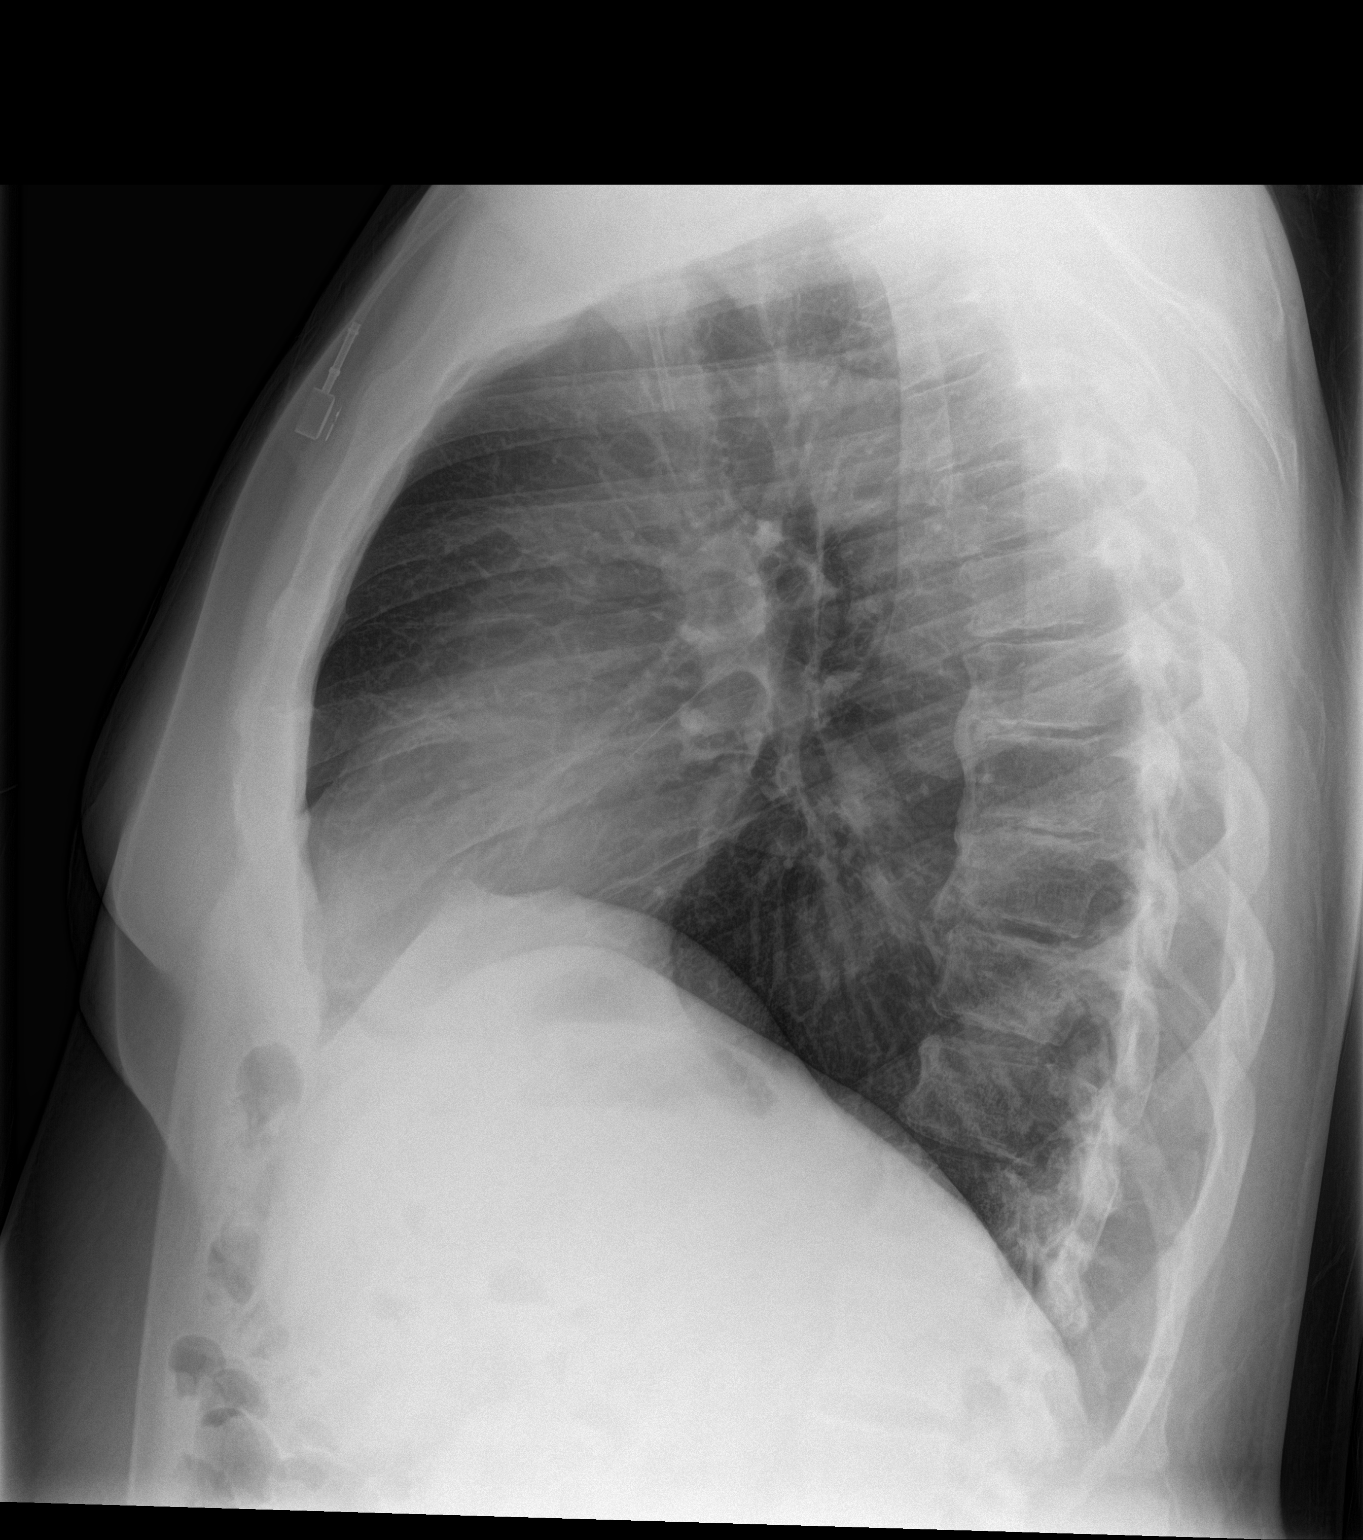

[2 of 2 positions shown; findings below may reference images not displayed]

FINDINGS: Right Port-A-Cath in place with the tip in the SVC. Heart and
mediastinal contours are within normal limits. No focal opacities or
effusions. No acute bony abnormality.
IMPRESSION: Right Port-A-Cath appears unchanged since prior study with the tip
in the SVC.

No active cardiopulmonary disease.

## 2017-01-14 ENCOUNTER — Encounter: Payer: Self-pay | Admitting: Hematology and Oncology

## 2017-01-20 ENCOUNTER — Encounter (INDEPENDENT_AMBULATORY_CARE_PROVIDER_SITE_OTHER): Payer: 59

## 2017-01-20 ENCOUNTER — Ambulatory Visit (INDEPENDENT_AMBULATORY_CARE_PROVIDER_SITE_OTHER): Payer: 59 | Admitting: Vascular Surgery

## 2017-02-02 ENCOUNTER — Inpatient Hospital Stay: Payer: 59 | Attending: Hematology and Oncology

## 2017-02-02 ENCOUNTER — Other Ambulatory Visit: Payer: Self-pay

## 2017-02-02 DIAGNOSIS — I10 Essential (primary) hypertension: Secondary | ICD-10-CM | POA: Diagnosis not present

## 2017-02-02 DIAGNOSIS — N189 Chronic kidney disease, unspecified: Secondary | ICD-10-CM | POA: Insufficient documentation

## 2017-02-02 DIAGNOSIS — M109 Gout, unspecified: Secondary | ICD-10-CM | POA: Insufficient documentation

## 2017-02-02 DIAGNOSIS — Z85048 Personal history of other malignant neoplasm of rectum, rectosigmoid junction, and anus: Secondary | ICD-10-CM

## 2017-02-02 DIAGNOSIS — D509 Iron deficiency anemia, unspecified: Secondary | ICD-10-CM | POA: Diagnosis not present

## 2017-02-02 DIAGNOSIS — K219 Gastro-esophageal reflux disease without esophagitis: Secondary | ICD-10-CM | POA: Diagnosis not present

## 2017-02-02 DIAGNOSIS — C20 Malignant neoplasm of rectum: Secondary | ICD-10-CM | POA: Insufficient documentation

## 2017-02-02 DIAGNOSIS — I2510 Atherosclerotic heart disease of native coronary artery without angina pectoris: Secondary | ICD-10-CM | POA: Insufficient documentation

## 2017-02-02 DIAGNOSIS — I739 Peripheral vascular disease, unspecified: Secondary | ICD-10-CM | POA: Insufficient documentation

## 2017-02-02 DIAGNOSIS — I252 Old myocardial infarction: Secondary | ICD-10-CM | POA: Insufficient documentation

## 2017-02-02 DIAGNOSIS — R911 Solitary pulmonary nodule: Secondary | ICD-10-CM | POA: Insufficient documentation

## 2017-02-02 DIAGNOSIS — Z923 Personal history of irradiation: Secondary | ICD-10-CM | POA: Insufficient documentation

## 2017-02-02 DIAGNOSIS — Z9221 Personal history of antineoplastic chemotherapy: Secondary | ICD-10-CM | POA: Diagnosis not present

## 2017-02-02 DIAGNOSIS — E785 Hyperlipidemia, unspecified: Secondary | ICD-10-CM | POA: Diagnosis not present

## 2017-02-02 DIAGNOSIS — Z79899 Other long term (current) drug therapy: Secondary | ICD-10-CM | POA: Diagnosis not present

## 2017-02-02 LAB — CBC WITH DIFFERENTIAL/PLATELET
Basophils Absolute: 0 10*3/uL (ref 0–0.1)
Basophils Relative: 1 %
Eosinophils Absolute: 0.2 10*3/uL (ref 0–0.7)
Eosinophils Relative: 4 %
HCT: 36.9 % — ABNORMAL LOW (ref 40.0–52.0)
Hemoglobin: 11.9 g/dL — ABNORMAL LOW (ref 13.0–18.0)
Lymphocytes Relative: 12 %
Lymphs Abs: 0.6 10*3/uL — ABNORMAL LOW (ref 1.0–3.6)
MCH: 25.5 pg — ABNORMAL LOW (ref 26.0–34.0)
MCHC: 32.3 g/dL (ref 32.0–36.0)
MCV: 78.8 fL — ABNORMAL LOW (ref 80.0–100.0)
Monocytes Absolute: 0.4 10*3/uL (ref 0.2–1.0)
Monocytes Relative: 7 %
Neutro Abs: 4.2 10*3/uL (ref 1.4–6.5)
Neutrophils Relative %: 76 %
Platelets: 286 10*3/uL (ref 150–440)
RBC: 4.68 MIL/uL (ref 4.40–5.90)
RDW: 15.6 % — ABNORMAL HIGH (ref 11.5–14.5)
WBC: 5.4 10*3/uL (ref 3.8–10.6)

## 2017-02-02 LAB — VITAMIN B12: Vitamin B-12: 208 pg/mL (ref 180–914)

## 2017-02-02 LAB — RETICULOCYTES
RBC.: 4.73 MIL/uL (ref 4.40–5.90)
Retic Count, Absolute: 66.2 10*3/uL (ref 19.0–183.0)
Retic Ct Pct: 1.4 % (ref 0.4–3.1)

## 2017-02-02 LAB — IRON AND TIBC
Iron: 29 ug/dL — ABNORMAL LOW (ref 45–182)
Saturation Ratios: 13 % — ABNORMAL LOW (ref 17.9–39.5)
TIBC: 220 ug/dL — ABNORMAL LOW (ref 250–450)
UIBC: 191 ug/dL

## 2017-02-02 LAB — FOLATE: Folate: 16.4 ng/mL (ref >5.9)

## 2017-02-02 LAB — SEDIMENTATION RATE: Sed Rate: 58 mm/hr — ABNORMAL HIGH (ref 0–20)

## 2017-02-02 LAB — FERRITIN: Ferritin: 255 ng/mL (ref 24–336)

## 2017-02-03 ENCOUNTER — Inpatient Hospital Stay: Payer: 59

## 2017-02-03 ENCOUNTER — Other Ambulatory Visit: Payer: Self-pay | Admitting: Urgent Care

## 2017-02-03 DIAGNOSIS — E785 Hyperlipidemia, unspecified: Secondary | ICD-10-CM | POA: Diagnosis not present

## 2017-02-03 DIAGNOSIS — Z9221 Personal history of antineoplastic chemotherapy: Secondary | ICD-10-CM | POA: Diagnosis not present

## 2017-02-03 DIAGNOSIS — R911 Solitary pulmonary nodule: Secondary | ICD-10-CM | POA: Diagnosis not present

## 2017-02-03 DIAGNOSIS — Z923 Personal history of irradiation: Secondary | ICD-10-CM | POA: Diagnosis not present

## 2017-02-03 DIAGNOSIS — C20 Malignant neoplasm of rectum: Secondary | ICD-10-CM | POA: Diagnosis not present

## 2017-02-03 DIAGNOSIS — Z79899 Other long term (current) drug therapy: Secondary | ICD-10-CM | POA: Diagnosis not present

## 2017-02-03 DIAGNOSIS — I10 Essential (primary) hypertension: Secondary | ICD-10-CM | POA: Diagnosis not present

## 2017-02-03 DIAGNOSIS — D519 Vitamin B12 deficiency anemia, unspecified: Secondary | ICD-10-CM

## 2017-02-03 DIAGNOSIS — D509 Iron deficiency anemia, unspecified: Secondary | ICD-10-CM | POA: Diagnosis not present

## 2017-02-03 DIAGNOSIS — I739 Peripheral vascular disease, unspecified: Secondary | ICD-10-CM | POA: Diagnosis not present

## 2017-02-06 LAB — METHYLMALONIC ACID, SERUM: METHYLMALONIC ACID, QUANTITATIVE: 130 nmol/L (ref 0–378)

## 2017-02-17 ENCOUNTER — Encounter (INDEPENDENT_AMBULATORY_CARE_PROVIDER_SITE_OTHER): Payer: 59

## 2017-02-17 ENCOUNTER — Ambulatory Visit (INDEPENDENT_AMBULATORY_CARE_PROVIDER_SITE_OTHER): Payer: 59 | Admitting: Vascular Surgery

## 2017-02-25 DIAGNOSIS — D649 Anemia, unspecified: Secondary | ICD-10-CM | POA: Diagnosis not present

## 2017-02-25 DIAGNOSIS — E785 Hyperlipidemia, unspecified: Secondary | ICD-10-CM | POA: Diagnosis not present

## 2017-02-25 DIAGNOSIS — I2510 Atherosclerotic heart disease of native coronary artery without angina pectoris: Secondary | ICD-10-CM | POA: Diagnosis not present

## 2017-02-25 DIAGNOSIS — E669 Obesity, unspecified: Secondary | ICD-10-CM | POA: Diagnosis not present

## 2017-02-25 DIAGNOSIS — I10 Essential (primary) hypertension: Secondary | ICD-10-CM | POA: Diagnosis not present

## 2017-02-25 DIAGNOSIS — M109 Gout, unspecified: Secondary | ICD-10-CM | POA: Diagnosis not present

## 2017-02-25 DIAGNOSIS — M79642 Pain in left hand: Secondary | ICD-10-CM | POA: Diagnosis not present

## 2017-02-25 DIAGNOSIS — R7303 Prediabetes: Secondary | ICD-10-CM | POA: Diagnosis not present

## 2017-02-25 DIAGNOSIS — E119 Type 2 diabetes mellitus without complications: Secondary | ICD-10-CM | POA: Diagnosis not present

## 2017-03-15 DIAGNOSIS — I10 Essential (primary) hypertension: Secondary | ICD-10-CM | POA: Diagnosis not present

## 2017-03-15 DIAGNOSIS — E669 Obesity, unspecified: Secondary | ICD-10-CM | POA: Diagnosis not present

## 2017-03-15 DIAGNOSIS — E119 Type 2 diabetes mellitus without complications: Secondary | ICD-10-CM | POA: Diagnosis not present

## 2017-03-15 DIAGNOSIS — M109 Gout, unspecified: Secondary | ICD-10-CM | POA: Diagnosis not present

## 2017-03-15 DIAGNOSIS — R7309 Other abnormal glucose: Secondary | ICD-10-CM | POA: Diagnosis not present

## 2017-03-24 ENCOUNTER — Encounter (INDEPENDENT_AMBULATORY_CARE_PROVIDER_SITE_OTHER): Payer: 59

## 2017-03-24 ENCOUNTER — Ambulatory Visit (INDEPENDENT_AMBULATORY_CARE_PROVIDER_SITE_OTHER): Payer: 59 | Admitting: Vascular Surgery

## 2017-04-21 ENCOUNTER — Encounter (INDEPENDENT_AMBULATORY_CARE_PROVIDER_SITE_OTHER): Payer: 59

## 2017-04-21 ENCOUNTER — Ambulatory Visit (INDEPENDENT_AMBULATORY_CARE_PROVIDER_SITE_OTHER): Payer: 59 | Admitting: Vascular Surgery

## 2017-04-26 DIAGNOSIS — R7309 Other abnormal glucose: Secondary | ICD-10-CM | POA: Diagnosis not present

## 2017-04-26 DIAGNOSIS — E119 Type 2 diabetes mellitus without complications: Secondary | ICD-10-CM | POA: Diagnosis not present

## 2017-04-26 DIAGNOSIS — E669 Obesity, unspecified: Secondary | ICD-10-CM | POA: Diagnosis not present

## 2017-04-26 DIAGNOSIS — I10 Essential (primary) hypertension: Secondary | ICD-10-CM | POA: Diagnosis not present

## 2017-04-29 ENCOUNTER — Telehealth: Payer: Self-pay | Admitting: *Deleted

## 2017-04-29 NOTE — Telephone Encounter (Signed)
Called patient to make him aware of his appt date and time Lab/MD was rescheuled per patient request. Reminder will will be mailed out.

## 2017-05-02 ENCOUNTER — Inpatient Hospital Stay: Payer: 59 | Admitting: Hematology and Oncology

## 2017-05-02 ENCOUNTER — Inpatient Hospital Stay: Payer: 59

## 2017-05-17 ENCOUNTER — Inpatient Hospital Stay (HOSPITAL_BASED_OUTPATIENT_CLINIC_OR_DEPARTMENT_OTHER): Payer: 59 | Admitting: Hematology and Oncology

## 2017-05-17 ENCOUNTER — Inpatient Hospital Stay: Payer: 59 | Attending: Hematology and Oncology

## 2017-05-17 ENCOUNTER — Other Ambulatory Visit: Payer: Self-pay | Admitting: Hematology and Oncology

## 2017-05-17 VITALS — BP 146/90 | HR 61 | Temp 98.2°F | Resp 18 | Wt 219.1 lb

## 2017-05-17 DIAGNOSIS — I252 Old myocardial infarction: Secondary | ICD-10-CM | POA: Diagnosis not present

## 2017-05-17 DIAGNOSIS — R7989 Other specified abnormal findings of blood chemistry: Secondary | ICD-10-CM

## 2017-05-17 DIAGNOSIS — G620 Drug-induced polyneuropathy: Secondary | ICD-10-CM | POA: Diagnosis not present

## 2017-05-17 DIAGNOSIS — I739 Peripheral vascular disease, unspecified: Secondary | ICD-10-CM | POA: Diagnosis not present

## 2017-05-17 DIAGNOSIS — C20 Malignant neoplasm of rectum: Secondary | ICD-10-CM | POA: Insufficient documentation

## 2017-05-17 DIAGNOSIS — E538 Deficiency of other specified B group vitamins: Secondary | ICD-10-CM

## 2017-05-17 DIAGNOSIS — Z85048 Personal history of other malignant neoplasm of rectum, rectosigmoid junction, and anus: Secondary | ICD-10-CM

## 2017-05-17 DIAGNOSIS — D509 Iron deficiency anemia, unspecified: Secondary | ICD-10-CM | POA: Insufficient documentation

## 2017-05-17 LAB — CBC WITH DIFFERENTIAL/PLATELET
Basophils Absolute: 0 10*3/uL (ref 0–0.1)
Basophils Relative: 1 %
Eosinophils Absolute: 0.1 10*3/uL (ref 0–0.7)
Eosinophils Relative: 2 %
HCT: 41.9 % (ref 40.0–52.0)
Hemoglobin: 13.4 g/dL (ref 13.0–18.0)
Lymphocytes Relative: 12 %
Lymphs Abs: 0.6 10*3/uL — ABNORMAL LOW (ref 1.0–3.6)
MCH: 25.3 pg — ABNORMAL LOW (ref 26.0–34.0)
MCHC: 32 g/dL (ref 32.0–36.0)
MCV: 79.1 fL — ABNORMAL LOW (ref 80.0–100.0)
Monocytes Absolute: 0.3 10*3/uL (ref 0.2–1.0)
Monocytes Relative: 6 %
Neutro Abs: 4.1 10*3/uL (ref 1.4–6.5)
Neutrophils Relative %: 79 %
Platelets: 233 10*3/uL (ref 150–440)
RBC: 5.3 MIL/uL (ref 4.40–5.90)
RDW: 16.3 % — ABNORMAL HIGH (ref 11.5–14.5)
WBC: 5.2 10*3/uL (ref 3.8–10.6)

## 2017-05-17 LAB — COMPREHENSIVE METABOLIC PANEL
ALT: 36 U/L (ref 17–63)
AST: 32 U/L (ref 15–41)
Albumin: 4.1 g/dL (ref 3.5–5.0)
Alkaline Phosphatase: 97 U/L (ref 38–126)
Anion gap: 11 (ref 5–15)
BUN: 24 mg/dL — ABNORMAL HIGH (ref 6–20)
CO2: 21 mmol/L — ABNORMAL LOW (ref 22–32)
Calcium: 9.1 mg/dL (ref 8.9–10.3)
Chloride: 103 mmol/L (ref 101–111)
Creatinine, Ser: 1.22 mg/dL (ref 0.61–1.24)
GFR calc Af Amer: >60 mL/min (ref >60)
GFR calc non Af Amer: >60 mL/min (ref >60)
Glucose, Bld: 128 mg/dL — ABNORMAL HIGH (ref 65–99)
Potassium: 3.8 mmol/L (ref 3.5–5.1)
Sodium: 135 mmol/L (ref 135–145)
Total Bilirubin: 0.6 mg/dL (ref 0.3–1.2)
Total Protein: 6.8 g/dL (ref 6.5–8.1)

## 2017-05-17 LAB — VITAMIN B12: Vitamin B-12: 222 pg/mL (ref 180–914)

## 2017-05-17 LAB — FERRITIN: Ferritin: 98 ng/mL (ref 24–336)

## 2017-05-17 NOTE — Progress Notes (Signed)
Patient offers no complaints today. 

## 2017-05-17 NOTE — Progress Notes (Signed)
[facility] day:  05/17/17   Chief Complaint: [name] WARSHAWSKY [name] is a 62 y.o. male with clinical stage IIA rectal cancer who is seen for 4 month assessment.  HPI:  The patient was last seen in the medical oncology clinic on 01/03/2017.  At that time, he had chronic issues secondary to peripheral vascular disease. His wrists were painful. Hematocrit had decreased from 41.9 to 38.1.  LFTs had returned to baseline. CEA was normal.  Ferritin was 218.  We discussed plans for colonoscopy. He had not been cleared by cardiology.  He had a stress test and was awaiting results.  Labs on 02/02/2017 revealed a hematocrit of 36.9, hemoglobin 11.9, and MCV 78.8.  Iron saturation was 13% with a TIBC of 220.  Sed rate was 58.  Folate was 16.4.  B12 was 208.  MMA was 130 on 02/03/2018.  During the interim, patient notes that he is "about the same".  Patient continues to deal with sequela associated with PVD in his lower extremities. He is followed by vascular (Dr. Lucky Cowboy).   Patient denies bleeding; no hematochezia, melena, or gross hematuria. Patient has no acute concerns today. He notes that he is "feeling ok".  Patient ambulatory with a cane. He denies pain in the clinic. Patient eating "ok".  Patient's weight down 8 pounds.    Past Medical History:  Diagnosis Date  . Atherosclerosis   . Cancer ([place])   . Chronic kidney disease    kidney stones, UTI  . Colon cancer ([place])   . Coronary artery disease   . GERD (gastroesophageal reflux disease)   . Gout   . Hematuria   . Hyperlipidemia   . Hypertension   . Iron deficiency anemia   . Myocardial infarction ([place]) 2005  . Peripheral vascular disease ([place])   . Pulmonary nodules   . Rectal cancer ([place])   . Ureter, stricture   . Wears dentures    full upper    Past Surgical History:  Procedure Laterality Date  . BOWEL RESECTION N/A 10/23/2014   Procedure: LOW ANTERIOR BOWEL RESECTION;  Surgeon: Sherri Rad,  MD;  Location: [facility];  Service: General;  Laterality: N/A;  . COLON SURGERY    . COLONOSCOPY 06/06/14    . COLONOSCOPY WITH ESOPHAGOGASTRODUODENOSCOPY (EGD)    . CORONARY ANGIOPLASTY WITH STENT PLACEMENT  03/2004  . CYSTOSCOPY WITH STENT PLACEMENT Bilateral 10/23/2014   Procedure: CYSTOSCOPY WITH STENT PLACEMENT,URETHRAL DILATION, LEFT RETROGRADE PYELOGRAM, URETEROSCOPY;  Surgeon: Hollice Espy, MD;  Location: [facility];  Service: Urology;  Laterality: Bilateral;  . DIVERTING ILEOSTOMY N/A 10/23/2014   Procedure: DIVERTING ILEOSTOMY;  Surgeon: Sherri Rad, MD;  Location: [facility];  Service: General;  Laterality: N/A;  . ILEOSTOMY CLOSURE N/A 09/08/2015   Procedure: ILEOSTOMY TAKEDOWN;  Surgeon: Clayburn Pert, MD;  Location: [facility];  Service: General;  Laterality: N/A;  . LAPAROTOMY N/A 10/23/2014   Procedure: EXPLORATORY LAPAROTOMY;  Surgeon: Sherri Rad, MD;  Location: [facility];  Service: General;  Laterality: N/A;  . PERIPHERAL VASCULAR CATHETERIZATION N/A 05/12/2015   Procedure: Abdominal Aortogram w/Lower Extremity;  Surgeon: Algernon Huxley, MD;  Location: [facility];  Service: Cardiovascular;  Laterality: N/A;  . PERIPHERAL VASCULAR CATHETERIZATION  05/12/2015   Procedure: Lower Extremity Intervention;  Surgeon: Algernon Huxley, MD;  Location: [facility];  Service: Cardiovascular;;  . PERIPHERAL VASCULAR CATHETERIZATION N/A 06/30/2015   Procedure: Abdominal Aortogram w/Lower Extremity;  Surgeon: Erskine Squibb  Lucky Cowboy, MD;  Location: [facility];  Service: Cardiovascular;  Laterality: N/A;  . PERIPHERAL VASCULAR CATHETERIZATION  06/30/2015   Procedure: Lower Extremity Intervention;  Surgeon: Algernon Huxley, MD;  Location: [facility];  Service: Cardiovascular;;  . PORT-A-CATH REMOVAL Right 09/08/2015   Procedure: REMOVAL PORT-A-CATH;  Surgeon: Clayburn Pert, MD;  Location: [facility];  Service: General;  Laterality: Right;  . PORTACATH PLACEMENT  07/01/2014   Dr. Marina Gravel    Family  History  Problem Relation Age of Onset  . Hypertension Brother   . Hypertension Father   . Diabetes Father   . Diabetes Brother   . Hypertension Brother   . Heart disease Paternal Grandfather     Social History:  reports that he quit smoking about 32 years ago. His smoking use included cigarettes. he has never used smokeless tobacco. He reports that he does not drink alcohol or use drugs.  He lives in [place].  He is working on disability.  The patient is alone today.   Allergies:  Allergies  Allergen Reactions  . No Known Allergies     Current Medications: Current Outpatient Medications  Medication Sig Dispense Refill  . acetaminophen (TYLENOL) 500 MG tablet Take 500 mg by mouth every 6 (six) hours as needed.    . Ascorbic Acid (VITAMIN C) 1000 MG tablet Take 1,000 mg by mouth daily. Reported on 09/26/2015    . aspirin EC 81 MG tablet Take 81 mg by mouth daily.     . cloNIDine (CATAPRES) 0.2 MG tablet Take 0.2 mg by mouth daily.    . colchicine 0.6 MG tablet Take 0.6 mg by mouth daily.    . ferrous sulfate 325 (65 FE) MG tablet Take 325 mg by mouth daily with breakfast.    . hydrochlorothiazide (HYDRODIURIL) 50 MG tablet Take by mouth.    Marland Kitchen HYDROcodone-acetaminophen (NORCO/VICODIN) 5-325 MG tablet Take 1-2 tablets by mouth every 4 (four) hours as needed for moderate pain. 30 tablet 0  . metFORMIN (GLUCOPHAGE-XR) 500 MG 24 hr tablet Take 500 mg by mouth daily.     No current facility-administered medications for this visit.    Facility-Administered Medications Ordered in Other Visits  Medication Dose Route Frequency Provider Last Rate Last Dose  . heparin lock flush 100 unit/mL  500 Units Intravenous Once Corcoran, Melissa C, MD      . sodium chloride 0.9 % injection 10 mL  10 mL Intravenous PRN Dallas Schimke, MD   10 mL at 08/26/14 1300  . sodium chloride flush (NS) 0.9 % injection 10 mL  10 mL Intravenous PRN Lequita Asal, MD        Review of Systems:  GENERAL:   Feels "ok".   No fevers, sweats.  Weight down 8 pounds. PERFORMANCE STATUS (ECOG):  2 HEENT:  No visual changes, runny nose, sore throat, mouth sores or tenderness. Lungs: No shortness of breath or cough.  No hemoptysis. Cardiac:  No chest pain, palpitations, orthopnea, or PND. GI:  Eating ok.  Constipation, takes Miralax.  No nausea, vomiting, diarrhea,melena or hematochezia. GU:  No urgency, frequency, dysuria, and intermittent hematuria. Musculoskeletal:  No back pain.  No joint pain.  No muscle tenderness. Extremities:  Peripheral vascular disease s/p angioplasty and stent.  Left foot/leg swelling is exerts self. Skin:  Gout.  No rashes or skin changes. Neuro: No headache, numbness or weakness, balance or coordination issues. Endocrine:  No diabetes, thyroid issues, hot flashes or night sweats. Psych:  No mood changes, depression or anxiety. Review of systems:  All other systems reviewed and found to be negative.   Physical Exam: Blood pressure (!) 146/90, pulse 61, temperature 98.2 F (36.8 C), temperature source Tympanic, resp. rate 18, weight 219 lb 2 oz (99.4 kg).  GENERAL:  Well developed, well nourished, gentleman sitting comfortably in the exam room in no acute distress.  He has a cane at his side. MENTAL STATUS:  Alert and oriented to person, place and time. HEAD:  Wearing a cap.  Brown hair.  Thin mustache.  Normocephalic, atraumatic, face symmetric, no Cushingoid features. EYES:  Brown eyes.  Pupils equal round and reactive to light and accomodation.  No conjunctivitis or scleral icterus. ENT:  Oropharynx clear without lesion.  Tongue normal. Mucous membranes moist.  RESPIRATORY:  Clear to auscultation without rales, wheezes or rhonchi. CARDIOVASCULAR:  Regular rate and rhythm without murmur, rub or gallop. ABDOMEN:  Well healed old ostomy site.  Soft, non-tender with active bowel sounds, and no hepatosplenomegaly.  No masses. SKIN:  No rashes, ulcers or lesions. EXTREMITIES:   Nail beds dark post chemotherapy.  No skin discoloration.  No palpable cords. LYMPH NODES: No palpable cervical, supraclavicular, axillary or inguinal adenopathy  NEUROLOGICAL: Unremarkable. PSYCH:  Appropriate.    Appointment on 05/17/2017  Component Date Value Ref Range Status  . WBC 05/17/2017 5.2  3.8 - 10.6 K/uL Final  . RBC 05/17/2017 5.30  4.40 - 5.90 MIL/uL Final  . Hemoglobin 05/17/2017 13.4  13.0 - 18.0 g/dL Final  . HCT 05/17/2017 41.9  40.0 - 52.0 % Final  . MCV 05/17/2017 79.1* 80.0 - 100.0 fL Final  . MCH 05/17/2017 25.3* 26.0 - 34.0 pg Final  . MCHC 05/17/2017 32.0  32.0 - 36.0 g/dL Final  . RDW 05/17/2017 16.3* 11.5 - 14.5 % Final  . Platelets 05/17/2017 233  150 - 440 K/uL Final  . Neutrophils Relative % 05/17/2017 79  % Final  . Neutro Abs 05/17/2017 4.1  1.4 - 6.5 K/uL Final  . Lymphocytes Relative 05/17/2017 12  % Final  . Lymphs Abs 05/17/2017 0.6* 1.0 - 3.6 K/uL Final  . Monocytes Relative 05/17/2017 6  % Final  . Monocytes Absolute 05/17/2017 0.3  0.2 - 1.0 K/uL Final  . Eosinophils Relative 05/17/2017 2  % Final  . Eosinophils Absolute 05/17/2017 0.1  0 - 0.7 K/uL Final  . Basophils Relative 05/17/2017 1  % Final  . Basophils Absolute 05/17/2017 0.0  0 - 0.1 K/uL Final   Performed at [facility], [address] [place], [place] [address]    Assessment:  [name] DOYLE [name] is a 62 y.o. male with clinical stage IIA (T3N0) rectal cancer.  Colonoscopy on 06/06/2014 revealed a large non-circumferential distal rectal mass. Biopsy was positive for adenocarcinoma.   CT scans revealed a 3 mm pulmonary nodule (indeterminate). There was a large rectal mass suspicious for transmural extension into the mesorectum.There was equivocal perirectal and sigmoid mesocolon nodes. There was no liver metastasis or extrapelvic disease.  Endoscopic ultrasound at [facility] revealed a 70% circumferential mass at 9-14 cm from the anal verge and measuring 9 mm in  maximal thickness. Ultrasound suggested breakthrough of the muscularis propria with invasion into the perirectal fat.  He received neoadjuvant chemotherapy (continuous infusion 5FU) and radiation. He received radiation from 07/03/2014 until 08/16/2014. He underwent low anterior resection with diverting loop ileostomy on 10/23/2014.  Pathology revealed a complete response.  Zero of 10 lymph nodes were  positive.  He underwent ileostomy take-down on 09/08/2015.  He has iron deficiency anemia.  Ferritin was 34 on 10/04/2014.  He is on oral iron (1 tablet/day).    He received 8 cycles of FOLFOX chemotherapy (12/27/2014 - 04/15/2015).  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    CEA has been followed:  2.1 on 08/26/2014, 3.6 on 04/15/2015, 2.5 on 06/23/2015, 3.2 on 09/26/2015, 2.8 on 12/29/2015, 2.7 on 04/22/2016, 3.6 on 08/31/2016, 2.8 on 01/03/2017, and 2.8 on 05/17/2017.  Chest, abdomen, and pelvic CT on 08/25/2016 revealed expected post treatment changes in presacral region.  There was no evidence of recurrent or metastatic carcinoma within the chest, abdomen, or pelvis.   He has significant peripheral vascular disease. He underwent angioplasty of the left mid to distal posterior tibial artery, above-knee popliteal artery and entire SFA on 05/12/2015.  He underwent stent 2 to the left SFA and popliteal artery for multiple areas of greater than 50% residual stenosis after angioplasty.    He has a history of increased liver function tests.  He denies any new medications.  He does not drink alcohol.  Hepatitis B surface antigen, hepatitis B core antibody total, and hepatitis C antibody were negative on 08/31/2016.  He has a mild microcytic anemia.  Ferritin was 255 with a sed rate of 58 on 02/02/2017.  B12 was 208 on 02/02/2017 and 222 on 05/17/2017.  Folate was normal.  MMA was normal on 02/03/2017.  He restarted oral iron on 01/03/2017.  Symptomatically, he has chronic  issues secondary to peripheral vascular disease. He denies any acute concerns today.  Hematocrit is 41.9.  Ferritin is 98.  Plan: 1.  Labs today:  CBC with diff, CMP, CEA, ferritin. 2.  Discuss colonoscopy.  Follow-up with Dr. Allen Norris. 3.  Labs pending at time of visit - call patient with lab results.  4.  Schedule CT chest, abdomen, and pelvis on 10/03/2017 5.  RTC in 6 months for MD assessment, labs (CBC with diff, CMP, ferritin, CEA), and review of CT.   Honor Loh, NP  05/17/2017, 11:32 AM   I saw and evaluated the patient, participating in the key portions of the service and reviewing pertinent diagnostic studies and records.  I reviewed the nurse practitioner's note and agree with the findings and the plan.  The assessment and plan were discussed with the patient.  A few questions were asked by the patient and answered.   Lequita Asal, MD  05/17/2017, 11:32 AM

## 2017-05-18 LAB — CEA: CEA: 2.8 ng/mL (ref 0.0–4.7)

## 2017-05-22 ENCOUNTER — Encounter: Payer: Self-pay | Admitting: Hematology and Oncology

## 2017-05-23 DIAGNOSIS — Z8679 Personal history of other diseases of the circulatory system: Secondary | ICD-10-CM | POA: Diagnosis not present

## 2017-05-23 DIAGNOSIS — I2510 Atherosclerotic heart disease of native coronary artery without angina pectoris: Secondary | ICD-10-CM | POA: Diagnosis not present

## 2017-05-23 DIAGNOSIS — R0602 Shortness of breath: Secondary | ICD-10-CM | POA: Diagnosis not present

## 2017-05-23 DIAGNOSIS — I208 Other forms of angina pectoris: Secondary | ICD-10-CM | POA: Diagnosis not present

## 2017-06-13 ENCOUNTER — Ambulatory Visit (INDEPENDENT_AMBULATORY_CARE_PROVIDER_SITE_OTHER): Payer: 59

## 2017-06-13 ENCOUNTER — Encounter (INDEPENDENT_AMBULATORY_CARE_PROVIDER_SITE_OTHER): Payer: Self-pay | Admitting: Vascular Surgery

## 2017-06-13 ENCOUNTER — Ambulatory Visit (INDEPENDENT_AMBULATORY_CARE_PROVIDER_SITE_OTHER): Payer: 59 | Admitting: Vascular Surgery

## 2017-06-13 VITALS — BP 153/89 | HR 59 | Resp 16 | Ht 66.0 in | Wt 219.0 lb

## 2017-06-13 DIAGNOSIS — I10 Essential (primary) hypertension: Secondary | ICD-10-CM

## 2017-06-13 DIAGNOSIS — E1169 Type 2 diabetes mellitus with other specified complication: Secondary | ICD-10-CM

## 2017-06-13 DIAGNOSIS — I739 Peripheral vascular disease, unspecified: Secondary | ICD-10-CM

## 2017-06-13 DIAGNOSIS — I25119 Atherosclerotic heart disease of native coronary artery with unspecified angina pectoris: Secondary | ICD-10-CM

## 2017-06-13 DIAGNOSIS — E782 Mixed hyperlipidemia: Secondary | ICD-10-CM

## 2017-06-13 DIAGNOSIS — I70219 Atherosclerosis of native arteries of extremities with intermittent claudication, unspecified extremity: Secondary | ICD-10-CM | POA: Diagnosis not present

## 2017-06-13 NOTE — Progress Notes (Signed)
MRN : [id number]  [name] DOUTHAT [name] is a 62 y.o. ([birth year]) male who presents with chief complaint of  Chief Complaint  Patient presents with  . Follow-up    6 month ABI  .  History of Present Illness: The patient returns to the office for followup and review of the noninvasive studies. There have been no interval changes in lower extremity symptoms. No interval shortening of the patient's claudication distance or development of rest pain symptoms. No new ulcers or wounds have occurred since the last visit.  There have been no significant changes to the patient's overall health care.  The patient denies amaurosis fugax or recent TIA symptoms. There are no recent neurological changes noted. The patient denies history of DVT, PE or superficial thrombophlebitis. The patient denies recent episodes of angina or shortness of breath.    Current Meds  Medication Sig  . acetaminophen (TYLENOL) 500 MG tablet Take 500 mg by mouth every 6 (six) hours as needed.  . cloNIDine (CATAPRES) 0.2 MG tablet Take 0.2 mg by mouth daily.  . clopidogrel (PLAVIX) 75 MG tablet   . colchicine 0.6 MG tablet Take 0.6 mg by mouth daily.  . ferrous sulfate 325 (65 FE) MG tablet Take 325 mg by mouth daily with breakfast.  . hydrochlorothiazide (MICROZIDE) 12.5 MG capsule   . metFORMIN (GLUCOPHAGE-XR) 500 MG 24 hr tablet Take 500 mg by mouth daily.    Past Medical History:  Diagnosis Date  . Atherosclerosis   . Cancer ([place])   . Chronic kidney disease    kidney stones, UTI  . Colon cancer ([place])   . Coronary artery disease   . GERD (gastroesophageal reflux disease)   . Gout   . Hematuria   . Hyperlipidemia   . Hypertension   . Iron deficiency anemia   . Myocardial infarction ([place]) 2005  . Peripheral vascular disease ([place])   . Pulmonary nodules   . Rectal cancer ([facility])   . Ureter, stricture   . Wears dentures    full upper    Past Surgical History:  Procedure Laterality Date  . BOWEL  RESECTION N/A 10/23/2014   Procedure: LOW ANTERIOR BOWEL RESECTION;  Surgeon: Sherri Rad, MD;  Location: [facility];  Service: General;  Laterality: N/A;  . COLON SURGERY    . COLONOSCOPY 06/06/14    . COLONOSCOPY WITH ESOPHAGOGASTRODUODENOSCOPY (EGD)    . CORONARY ANGIOPLASTY WITH STENT PLACEMENT  03/2004  . CYSTOSCOPY WITH STENT PLACEMENT Bilateral 10/23/2014   Procedure: CYSTOSCOPY WITH STENT PLACEMENT,URETHRAL DILATION, LEFT RETROGRADE PYELOGRAM, URETEROSCOPY;  Surgeon: Hollice Espy, MD;  Location: [facility];  Service: Urology;  Laterality: Bilateral;  . DIVERTING ILEOSTOMY N/A 10/23/2014   Procedure: DIVERTING ILEOSTOMY;  Surgeon: Sherri Rad, MD;  Location: [facility];  Service: General;  Laterality: N/A;  . ILEOSTOMY CLOSURE N/A 09/08/2015   Procedure: ILEOSTOMY TAKEDOWN;  Surgeon: Clayburn Pert, MD;  Location: [facility];  Service: General;  Laterality: N/A;  . LAPAROTOMY N/A 10/23/2014   Procedure: EXPLORATORY LAPAROTOMY;  Surgeon: Sherri Rad, MD;  Location: [facility];  Service: General;  Laterality: N/A;  . PERIPHERAL VASCULAR CATHETERIZATION N/A 05/12/2015   Procedure: Abdominal Aortogram w/Lower Extremity;  Surgeon: Algernon Huxley, MD;  Location: [facility];  Service: Cardiovascular;  Laterality: N/A;  . PERIPHERAL VASCULAR CATHETERIZATION  05/12/2015   Procedure: Lower Extremity Intervention;  Surgeon: Algernon Huxley, MD;  Location: [place] [facility];  Service: Cardiovascular;;  . PERIPHERAL VASCULAR CATHETERIZATION N/A 06/30/2015  Procedure: Abdominal Aortogram w/Lower Extremity;  Surgeon: Algernon Huxley, MD;  Location: [facility];  Service: Cardiovascular;  Laterality: N/A;  . PERIPHERAL VASCULAR CATHETERIZATION  06/30/2015   Procedure: Lower Extremity Intervention;  Surgeon: Algernon Huxley, MD;  Location: [place] [facility];  Service: Cardiovascular;;  . PORT-A-CATH REMOVAL Right 09/08/2015   Procedure: REMOVAL PORT-A-CATH;  Surgeon: Clayburn Pert, MD;  Location: [facility];  Service:  General;  Laterality: Right;  . PORTACATH PLACEMENT  07/01/2014   Dr. Marina Gravel    Social History Social History   Tobacco Use  . Smoking status: Former Smoker    Types: Cigarettes    Last attempt to quit: 11/19/1984    Years since quitting: 32.5  . Smokeless tobacco: Never Used  . Tobacco comment: smoked for brief period in his teens  Substance Use Topics  . Alcohol use: No    Alcohol/week: 0.0 oz  . Drug use: No    Family History Family History  Problem Relation Age of Onset  . Hypertension Brother   . Hypertension Father   . Diabetes Father   . Diabetes Brother   . Hypertension Brother   . Heart disease Paternal Grandfather     Allergies  Allergen Reactions  . No Known Allergies      REVIEW OF SYSTEMS (Negative unless checked)  Constitutional: [] Weight loss  [] Fever  [] Chills Cardiac: [] Chest pain   [] Chest pressure   [] Palpitations   [] Shortness of breath when laying flat   [] Shortness of breath with exertion. Vascular:  [x] Pain in legs with walking   [] Pain in legs at rest  [] History of DVT   [] Phlebitis   [] Swelling in legs   [] Varicose veins   [] Non-healing ulcers Pulmonary:   [] Uses home oxygen   [] Productive cough   [] Hemoptysis   [] Wheeze  [] COPD   [] Asthma Neurologic:  [] Dizziness   [] Seizures   [] History of stroke   [] History of TIA  [] Aphasia   [] Vissual changes   [] Weakness or numbness in arm   [] Weakness or numbness in leg Musculoskeletal:   [] Joint swelling   [] Joint pain   [] Low back pain Hematologic:  [] Easy bruising  [] Easy bleeding   [] Hypercoagulable state   [] Anemic Gastrointestinal:  [] Diarrhea   [] Vomiting  [] Gastroesophageal reflux/heartburn   [] Difficulty swallowing. Genitourinary:  [] Chronic kidney disease   [] Difficult urination  [] Frequent urination   [] Blood in urine Skin:  [] Rashes   [] Ulcers  Psychological:  [] History of anxiety   []  History of major depression.  Physical Examination  Vitals:   06/13/17 1126  BP: (!) 153/89  Pulse: (!)  59  Resp: 16  Weight: 219 lb (99.3 kg)  Height: 5\' 6"  (1.676 m)   Body mass index is 35.35 kg/m. Gen: WD/WN, NAD Head: [place]/AT, No temporalis wasting.  Ear/Nose/Throat: Hearing grossly intact, nares w/o erythema or drainage Eyes: PER, EOMI, sclera nonicteric.  Neck: Supple, no large masses.   Pulmonary:  Good air movement, no audible wheezing bilaterally, no use of accessory muscles.  Cardiac: RRR, no JVD Vascular:  Vessel Right Left  Radial Palpable Palpable  PT Not Palpable Not Palpable  DP Not Palpable Not Palpable  Gastrointestinal: Non-distended. No guarding/no peritoneal signs.  Musculoskeletal: M/S 5/5 throughout.  No deformity or atrophy.  Neurologic: CN 2-12 intact. Symmetrical.  Speech is fluent. Motor exam as listed above. Psychiatric: Judgment intact, Mood & affect appropriate for pt's clinical situation. Dermatologic: No rashes or ulcers noted.  No changes consistent with cellulitis. Lymph :  No lichenification or skin changes of chronic lymphedema.  CBC Lab Results  Component Value Date   WBC 5.2 05/17/2017   HGB 13.4 05/17/2017   HCT 41.9 05/17/2017   MCV 79.1 (L) 05/17/2017   PLT 233 05/17/2017    BMET    Component Value Date/Time   NA 135 05/17/2017 1035   NA 132 (L) 08/12/2014 0858   K 3.8 05/17/2017 1035   K 4.0 08/12/2014 0858   CL 103 05/17/2017 1035   CL 100 (L) 08/12/2014 0858   CO2 21 (L) 05/17/2017 1035   CO2 24 08/12/2014 0858   GLUCOSE 128 (H) 05/17/2017 1035   GLUCOSE 118 (H) 08/12/2014 0858   BUN 24 (H) 05/17/2017 1035   BUN 30 (H) 08/12/2014 0858   CREATININE 1.22 05/17/2017 1035   CREATININE 1.52 (H) 08/12/2014 0858   CALCIUM 9.1 05/17/2017 1035   CALCIUM 8.9 08/12/2014 0858   GFRNONAA >60 05/17/2017 1035   GFRNONAA 49 (L) 08/12/2014 0858   GFRAA >60 05/17/2017 1035   GFRAA 57 (L) 08/12/2014 0858   CrCl cannot be calculated (Patient's most recent lab result is older than the maximum 21 days allowed.).  COAG Lab Results    Component Value Date   INR 1.00 08/28/2015    Radiology No results found.   Assessment/Plan 1. Atherosclerotic peripheral vascular disease with intermittent claudication (HCC)  Recommend:  The patient has evidence of atherosclerosis of the lower extremities with claudication.  The patient does not voice lifestyle limiting changes at this point in time.  Noninvasive studies do not suggest clinically significant change.  No invasive studies, angiography or surgery at this time The patient should continue walking and begin a more formal exercise program.  The patient should continue antiplatelet therapy and aggressive treatment of the lipid abnormalities  No changes in the patient's medications at this time  The patient should continue wearing graduated compression socks 10-15 mmHg strength to control the mild edema.   - VAS [place] ABI WITH/WO TBI; Future  2. Coronary artery disease involving native coronary artery of native heart with angina pectoris ([place]) Continue cardiac and antihypertensive medications as already ordered and reviewed, no changes at this time.  Continue statin as ordered and reviewed, no changes at this time  Nitrates PRN for chest pain   3. Essential hypertension Continue antihypertensive medications as already ordered, these medications have been reviewed and there are no changes at this time.   4. Type 2 diabetes mellitus with other specified complication, without long-term current use of insulin (HCC) Continue hypoglycemic medications as already ordered, these medications have been reviewed and there are no changes at this time.  Hgb A1C to be monitored as already arranged by primary service   5. Mixed hyperlipidemia Continue statin as ordered and reviewed, no changes at this time     Hortencia Pilar, MD  06/13/2017 9:23 PM

## 2017-06-21 DIAGNOSIS — E119 Type 2 diabetes mellitus without complications: Secondary | ICD-10-CM | POA: Diagnosis not present

## 2017-06-21 DIAGNOSIS — I739 Peripheral vascular disease, unspecified: Secondary | ICD-10-CM | POA: Diagnosis not present

## 2017-06-21 DIAGNOSIS — Z1159 Encounter for screening for other viral diseases: Secondary | ICD-10-CM | POA: Diagnosis not present

## 2017-06-21 DIAGNOSIS — E669 Obesity, unspecified: Secondary | ICD-10-CM | POA: Diagnosis not present

## 2017-06-21 DIAGNOSIS — E1165 Type 2 diabetes mellitus with hyperglycemia: Secondary | ICD-10-CM | POA: Diagnosis not present

## 2017-06-21 DIAGNOSIS — I10 Essential (primary) hypertension: Secondary | ICD-10-CM | POA: Diagnosis not present

## 2017-06-21 DIAGNOSIS — I2510 Atherosclerotic heart disease of native coronary artery without angina pectoris: Secondary | ICD-10-CM | POA: Diagnosis not present

## 2017-06-21 DIAGNOSIS — R7309 Other abnormal glucose: Secondary | ICD-10-CM | POA: Diagnosis not present

## 2017-06-21 DIAGNOSIS — E785 Hyperlipidemia, unspecified: Secondary | ICD-10-CM | POA: Diagnosis not present

## 2017-06-21 DIAGNOSIS — M109 Gout, unspecified: Secondary | ICD-10-CM | POA: Diagnosis not present

## 2017-10-03 ENCOUNTER — Ambulatory Visit: Admission: RE | Admit: 2017-10-03 | Payer: 59 | Source: Ambulatory Visit

## 2017-10-06 ENCOUNTER — Inpatient Hospital Stay: Payer: 59 | Attending: Hematology and Oncology

## 2017-10-06 ENCOUNTER — Inpatient Hospital Stay: Payer: 59 | Admitting: Hematology and Oncology

## 2017-10-06 NOTE — Progress Notes (Deleted)
[facility] day:  10/06/17   Chief Complaint: [name] FRICK [name] is a 62 y.o. male with clinical stage IIA rectal cancer who is seen for 5 month assessment.  HPI:  The patient was last seen in the medical oncology clinic on 05/17/2017.  At that time, he had chronic issues secondary to peripheral vascular disease. He denied any acute concerns.  Hematocrit was 41.9.  Ferritin was 98.  CEA was 2.8.  We discussed the plan for follow-up colonoscopy.  He was awaiting clearance from cardiology.  Chest, abdomen, and pelvic CT were ordered.   Past Medical History:  Diagnosis Date  . Atherosclerosis   . Cancer ([place])   . Chronic kidney disease    kidney stones, UTI  . Colon cancer ([place])   . Coronary artery disease   . GERD (gastroesophageal reflux disease)   . Gout   . Hematuria   . Hyperlipidemia   . Hypertension   . Iron deficiency anemia   . Myocardial infarction ([place]) 2005  . Peripheral vascular disease ([place])   . Pulmonary nodules   . Rectal cancer ([place])   . Ureter, stricture   . Wears dentures    full upper    Past Surgical History:  Procedure Laterality Date  . BOWEL RESECTION N/A 10/23/2014   Procedure: LOW ANTERIOR BOWEL RESECTION;  Surgeon: Sherri Rad, MD;  Location: [facility];  Service: General;  Laterality: N/A;  . COLON SURGERY    . COLONOSCOPY 06/06/14    . COLONOSCOPY WITH ESOPHAGOGASTRODUODENOSCOPY (EGD)    . CORONARY ANGIOPLASTY WITH STENT PLACEMENT  03/2004  . CYSTOSCOPY WITH STENT PLACEMENT Bilateral 10/23/2014   Procedure: CYSTOSCOPY WITH STENT PLACEMENT,URETHRAL DILATION, LEFT RETROGRADE PYELOGRAM, URETEROSCOPY;  Surgeon: Hollice Espy, MD;  Location: [facility];  Service: Urology;  Laterality: Bilateral;  . DIVERTING ILEOSTOMY N/A 10/23/2014   Procedure: DIVERTING ILEOSTOMY;  Surgeon: Sherri Rad, MD;  Location: [facility];  Service: General;  Laterality: N/A;  . ILEOSTOMY CLOSURE N/A 09/08/2015   Procedure: ILEOSTOMY  TAKEDOWN;  Surgeon: Clayburn Pert, MD;  Location: [facility];  Service: General;  Laterality: N/A;  . LAPAROTOMY N/A 10/23/2014   Procedure: EXPLORATORY LAPAROTOMY;  Surgeon: Sherri Rad, MD;  Location: [facility];  Service: General;  Laterality: N/A;  . PERIPHERAL VASCULAR CATHETERIZATION N/A 05/12/2015   Procedure: Abdominal Aortogram w/Lower Extremity;  Surgeon: Algernon Huxley, MD;  Location: [facility];  Service: Cardiovascular;  Laterality: N/A;  . PERIPHERAL VASCULAR CATHETERIZATION  05/12/2015   Procedure: Lower Extremity Intervention;  Surgeon: Algernon Huxley, MD;  Location: [facility];  Service: Cardiovascular;;  . PERIPHERAL VASCULAR CATHETERIZATION N/A 06/30/2015   Procedure: Abdominal Aortogram w/Lower Extremity;  Surgeon: Algernon Huxley, MD;  Location: [facility];  Service: Cardiovascular;  Laterality: N/A;  . PERIPHERAL VASCULAR CATHETERIZATION  06/30/2015   Procedure: Lower Extremity Intervention;  Surgeon: Algernon Huxley, MD;  Location: [facility];  Service: Cardiovascular;;  . PORT-A-CATH REMOVAL Right 09/08/2015   Procedure: REMOVAL PORT-A-CATH;  Surgeon: Clayburn Pert, MD;  Location: [facility];  Service: General;  Laterality: Right;  . PORTACATH PLACEMENT  07/01/2014   Dr. Marina Gravel    Family History  Problem Relation Age of Onset  . Hypertension Brother   . Hypertension Father   . Diabetes Father   . Diabetes Brother   . Hypertension Brother   . Heart disease Paternal Grandfather     Social History:  reports that  he quit smoking about 32 years ago. His smoking use included cigarettes. He has never used smokeless tobacco. He reports that he does not drink alcohol or use drugs.  He lives in [place].  He is working on disability.  The patient is alone today.   Allergies:  Allergies  Allergen Reactions  . No Known Allergies     Current Medications: Current Outpatient Medications  Medication Sig Dispense Refill  . acetaminophen (TYLENOL) 500 MG  tablet Take 500 mg by mouth every 6 (six) hours as needed.    . Ascorbic Acid (VITAMIN C) 1000 MG tablet Take 1,000 mg by mouth daily. Reported on 09/26/2015    . aspirin EC 81 MG tablet Take 81 mg by mouth daily.     . cloNIDine (CATAPRES) 0.2 MG tablet Take 0.2 mg by mouth daily.    . clopidogrel (PLAVIX) 75 MG tablet     . colchicine 0.6 MG tablet Take 0.6 mg by mouth daily.    . ferrous sulfate 325 (65 FE) MG tablet Take 325 mg by mouth daily with breakfast.    . hydrochlorothiazide (MICROZIDE) 12.5 MG capsule     . HYDROcodone-acetaminophen (NORCO/VICODIN) 5-325 MG tablet Take 1-2 tablets by mouth every 4 (four) hours as needed for moderate pain. (Patient not taking: Reported on 06/13/2017) 30 tablet 0  . metFORMIN (GLUCOPHAGE-XR) 500 MG 24 hr tablet Take 500 mg by mouth daily.     No current facility-administered medications for this visit.    Facility-Administered Medications Ordered in Other Visits  Medication Dose Route Frequency Provider Last Rate Last Dose  . heparin lock flush 100 unit/mL  500 Units Intravenous Once Corcoran, Melissa C, MD      . sodium chloride 0.9 % injection 10 mL  10 mL Intravenous PRN Dallas Schimke, MD   10 mL at 08/26/14 1300  . sodium chloride flush (NS) 0.9 % injection 10 mL  10 mL Intravenous PRN Lequita Asal, MD        Review of Systems:  GENERAL:  Feels "ok".   No fevers, sweats.  Weight down 8 pounds. PERFORMANCE STATUS (ECOG):  2 HEENT:  No visual changes, runny nose, sore throat, mouth sores or tenderness. Lungs: No shortness of breath or cough.  No hemoptysis. Cardiac:  No chest pain, palpitations, orthopnea, or PND. GI:  Eating ok.  Constipation, takes Miralax.  No nausea, vomiting, diarrhea,melena or hematochezia. GU:  No urgency, frequency, dysuria, and intermittent hematuria. Musculoskeletal:  No back pain.  No joint pain.  No muscle tenderness. Extremities:  Peripheral vascular disease s/p angioplasty and stent.  Left foot/leg  swelling is exerts self. Skin:  Gout.  No rashes or skin changes. Neuro: No headache, numbness or weakness, balance or coordination issues. Endocrine:  No diabetes, thyroid issues, hot flashes or night sweats. Psych:  No mood changes, depression or anxiety. Review of systems:  All other systems reviewed and found to be negative.   Physical Exam: There were no vitals taken for this visit.  GENERAL:  Well developed, well nourished, gentleman sitting comfortably in the exam room in no acute distress.  He has a cane at his side. MENTAL STATUS:  Alert and oriented to person, place and time. HEAD:  Wearing a cap.  Brown hair.  Thin mustache.  Normocephalic, atraumatic, face symmetric, no Cushingoid features. EYES:  Brown eyes.  Pupils equal round and reactive to light and accomodation.  No conjunctivitis or scleral icterus. ENT:  Oropharynx clear without lesion.  Tongue normal. Mucous membranes moist.  RESPIRATORY:  Clear to auscultation without rales, wheezes or rhonchi. CARDIOVASCULAR:  Regular rate and rhythm without murmur, rub or gallop. ABDOMEN:  Well healed old ostomy site.  Soft, non-tender with active bowel sounds, and no hepatosplenomegaly.  No masses. SKIN:  No rashes, ulcers or lesions. EXTREMITIES:  Nail beds dark post chemotherapy.  No skin discoloration.  No palpable cords. LYMPH NODES: No palpable cervical, supraclavicular, axillary or inguinal adenopathy  NEUROLOGICAL: Unremarkable. PSYCH:  Appropriate.   GENERAL:  Well developed, well nourished, gentleman sitting comfortably in the exam room in no acute distress. MENTAL STATUS:  Alert and oriented to person, place and time. HEAD:  *** hair.  Normocephalic, atraumatic, face symmetric, no Cushingoid features. EYES:  *** eyes.  Pupils equal round and reactive to light and accomodation.  No conjunctivitis or scleral icterus. ENT:  Oropharynx clear without lesion.  Tongue normal. Mucous membranes moist.  RESPIRATORY:  Clear to  auscultation without rales, wheezes or rhonchi. CARDIOVASCULAR:  Regular rate and rhythm without murmur, rub or gallop. ABDOMEN:  Soft, non-tender, with active bowel sounds, and no hepatosplenomegaly.  No masses. SKIN:  No rashes, ulcers or lesions. EXTREMITIES: No edema, no skin discoloration or tenderness.  No palpable cords. LYMPH NODES: No palpable cervical, supraclavicular, axillary or inguinal adenopathy  NEUROLOGICAL: Unremarkable. PSYCH:  Appropriate.    No visits with results within 3 Day(s) from this visit.  Latest known visit with results is:  Appointment on 05/17/2017  Component Date Value Ref Range Status  . Vitamin B-12 05/17/2017 222  180 - 914 pg/mL Final   Comment: (NOTE) This assay is not validated for testing neonatal or myeloproliferative syndrome specimens for Vitamin B12 levels. Performed at [facility], [address] [place], [place] [address]   . CEA 05/17/2017 2.8  0.0 - 4.7 ng/mL Final   Comment: (NOTE)                             Nonsmokers          <3.9                             Smokers             <5.6 [employer] Electrochemiluminescence Immunoassay (ECLIA) Values obtained with different assay methods or kits cannot be used interchangeably.  Results cannot be interpreted as absolute evidence of the presence or absence of malignant disease. Performed At: [facility] [address] [place], [place] [id number] Nagendra Sanjai MD Ph:[phone] Performed at [facility], [address] [place], [place] [address]   . Ferritin 05/17/2017 98  24 - 336 ng/mL Final   Performed at [facility], [address] [place],  [address]  . Sodium 05/17/2017 135  135 - 145 mmol/L Final  . Potassium 05/17/2017 3.8  3.5 - 5.1 mmol/L Final  . Chloride 05/17/2017 103  101 - 111 mmol/L Final  . CO2 05/17/2017 21* 22 - 32 mmol/L Final  . Glucose, Bld 05/17/2017 128* 65 - 99 mg/dL Final  . BUN 05/17/2017 24* 6 - 20  mg/dL Final  . Creatinine, Ser 05/17/2017 1.22  0.61 - 1.24 mg/dL Final  . Calcium 05/17/2017 9.1  8.9 - 10.3 mg/dL Final  . Total Protein 05/17/2017 6.8  6.5 - 8.1 g/dL Final  . Albumin 05/17/2017 4.1  3.5 - 5.0 g/dL   Final  . AST 05/17/2017 32  15 - 41 U/L Final  . ALT 05/17/2017 36  17 - 63 U/L Final  . Alkaline Phosphatase 05/17/2017 97  38 - 126 U/L Final  . Total Bilirubin 05/17/2017 0.6  0.3 - 1.2 mg/dL Final  . GFR calc non Af Amer 05/17/2017 >60  >60 mL/min Final  . GFR calc Af Amer 05/17/2017 >60  >60 mL/min Final   Comment: (NOTE) The eGFR has been calculated using the CKD EPI equation. This calculation has not been validated in all clinical situations. eGFR's persistently <60 mL/min signify possible Chronic Kidney Disease.   . Anion gap 05/17/2017 11  5 - 15 Final   Performed at [facility], [address] [place], [place] [address]  . WBC 05/17/2017 5.2  3.8 - 10.6 K/uL Final  . RBC 05/17/2017 5.30  4.40 - 5.90 MIL/uL Final  . Hemoglobin 05/17/2017 13.4  13.0 - 18.0 g/dL Final  . HCT 05/17/2017 41.9  40.0 - 52.0 % Final  . MCV 05/17/2017 79.1* 80.0 - 100.0 fL Final  . MCH 05/17/2017 25.3* 26.0 - 34.0 pg Final  . MCHC 05/17/2017 32.0  32.0 - 36.0 g/dL Final  . RDW 05/17/2017 16.3* 11.5 - 14.5 % Final  . Platelets 05/17/2017 233  150 - 440 K/uL Final  . Neutrophils Relative % 05/17/2017 79  % Final  . Neutro Abs 05/17/2017 4.1  1.4 - 6.5 K/uL Final  . Lymphocytes Relative 05/17/2017 12  % Final  . Lymphs Abs 05/17/2017 0.6* 1.0 - 3.6 K/uL Final  . Monocytes Relative 05/17/2017 6  % Final  . Monocytes Absolute 05/17/2017 0.3  0.2 - 1.0 K/uL Final  . Eosinophils Relative 05/17/2017 2  % Final  . Eosinophils Absolute 05/17/2017 0.1  0 - 0.7 K/uL Final  . Basophils Relative 05/17/2017 1  % Final  . Basophils Absolute 05/17/2017 0.0  0 - 0.1 K/uL Final   Performed at [facility], [address] [place], [place] [address]    Assessment:  Elige T  Wallner [name] is a 61 y.o. male with clinical stage IIA (T3N0) rectal cancer.  Colonoscopy on 06/06/2014 revealed a large non-circumferential distal rectal mass. Biopsy was positive for adenocarcinoma.   CT scans revealed a 3 mm pulmonary nodule (indeterminate). There was a large rectal mass suspicious for transmural extension into the mesorectum.There was equivocal perirectal and sigmoid mesocolon nodes. There was no liver metastasis or extrapelvic disease.  Endoscopic ultrasound at [facility] revealed a 70% circumferential mass at 9-14 cm from the anal verge and measuring 9 mm in maximal thickness. Ultrasound suggested breakthrough of the muscularis propria with invasion into the perirectal fat.  He received neoadjuvant chemotherapy (continuous infusion 5FU) and radiation. He received radiation from 07/03/2014 until 08/16/2014. He underwent low anterior resection with diverting loop ileostomy on 10/23/2014.  Pathology revealed a complete response.  Zero of 10 lymph nodes were positive.  He underwent ileostomy take-down on 09/08/2015.  He has iron deficiency anemia.  Ferritin was 34 on 10/04/2014.  He is on oral iron (1 tablet/day).    He received 8 cycles of FOLFOX chemotherapy (12/27/2014 - 04/15/2015).  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    CEA has been followed:  2.1 on 08/26/2014, 3.6 on 04/15/2015, 2.5 on 06/23/2015, 3.2 on 09/26/2015, 2.8 on 12/29/2015, 2.7 on 04/22/2016, 3.6 on 08/31/2016, 2.8 on 01/03/2017, and 2.8 on 05/17/2017.  Chest, abdomen, and pelvic CT on 08/25/2016 revealed expected post   treatment changes in presacral region.  There was no evidence of recurrent or metastatic carcinoma within the chest, abdomen, or pelvis.   He has significant peripheral vascular disease. He underwent angioplasty of the left mid to distal posterior tibial artery, above-knee popliteal artery and entire SFA on 05/12/2015.  He underwent stent 2 to the left SFA and  popliteal artery for multiple areas of greater than 50% residual stenosis after angioplasty.    He has a history of increased liver function tests.  He denies any new medications.  He does not drink alcohol.  Hepatitis B surface antigen, hepatitis B core antibody total, and hepatitis C antibody were negative on 08/31/2016.  He has a mild microcytic anemia.  Ferritin was 255 with a sed rate of 58 on 02/02/2017.  B12 was 208 on 02/02/2017 and 222 on 05/17/2017.  Folate was normal.  MMA was normal on 02/03/2017.  He restarted oral iron on 01/03/2017.  Symptomatically, he has chronic issues secondary to peripheral vascular disease. He denies any acute concerns today.  Hematocrit is 41.9.  Ferritin is 98.  Plan: 1.  Labs today:  CBC with diff, CMP, CEA, ferritin.   2.  Discuss colonoscopy.  Follow-up with Dr. Allen Norris. 3.  Labs pending at time of visit - call patient with lab results.  4.  Schedule CT chest, abdomen, and pelvis on 10/03/2017 5.  RTC in 6 months for MD assessment, labs (CBC with diff, CMP, ferritin, CEA), and review of CT.   Lequita Asal, MD  10/06/2017, 6:10 AM   I saw and evaluated the patient, participating in the key portions of the service and reviewing pertinent diagnostic studies and records.  I reviewed the nurse practitioner's note and agree with the findings and the plan.  The assessment and plan were discussed with the patient.  A few questions were asked by the patient and answered.   Lequita Asal, MD  10/06/2017, 6:10 AM

## 2017-11-04 ENCOUNTER — Other Ambulatory Visit: Payer: Self-pay | Admitting: Urgent Care

## 2017-11-04 DIAGNOSIS — C20 Malignant neoplasm of rectum: Secondary | ICD-10-CM

## 2017-11-07 ENCOUNTER — Telehealth: Payer: Self-pay | Admitting: *Deleted

## 2017-11-07 NOTE — Telephone Encounter (Signed)
10/06/17 CT/lab/MD appt were R/S. Patient called to R/S all appt per his request. NP had to redo orders due to them being over a year old. CT was R/S to 11/15/17 @ 1:00 Patient was made aware that he needed to pick up prep before the scheduled date. Also lab/MD appts. were  R/S on Mon. 11/21/17 per patient request. He is aware of all scheduled appts. Also a reminder letter will also be mail out.

## 2017-11-15 ENCOUNTER — Ambulatory Visit
Admission: RE | Admit: 2017-11-15 | Discharge: 2017-11-15 | Disposition: A | Discharge status: Home / Self Care | Payer: 59 | Source: Ambulatory Visit | Attending: Urgent Care | Admitting: Urgent Care

## 2017-11-15 DIAGNOSIS — R911 Solitary pulmonary nodule: Secondary | ICD-10-CM | POA: Diagnosis not present

## 2017-11-15 DIAGNOSIS — K460 Unspecified abdominal hernia with obstruction, without gangrene: Secondary | ICD-10-CM | POA: Diagnosis not present

## 2017-11-15 DIAGNOSIS — C20 Malignant neoplasm of rectum: Secondary | ICD-10-CM | POA: Insufficient documentation

## 2017-11-15 HISTORY — DX: Type 2 diabetes mellitus without complications: E11.9

## 2017-11-15 LAB — POCT I-STAT CREATININE: Creatinine, Ser: 1.2 mg/dL (ref 0.61–1.24)

## 2017-11-15 MED ORDER — IOHEXOL 300 MG/ML  SOLN
100.0000 mL | Freq: Once | INTRAMUSCULAR | Status: AC | PRN
  Administered 2017-11-15: 100 mL via INTRAVENOUS

## 2017-11-20 NOTE — Progress Notes (Signed)
[place] [facility] day:  11/21/17   Chief Complaint: [name] DWORKIN [name] is a 62 y.o. male with clinical stage IIA rectal cancer who is seen for 6 month assessment.  HPI:  The patient was last seen in the medical oncology clinic on 05/17/2017.  At that time, patient was doing "about the same". He has chronic PVD symptoms in his lower extremities and was being followed by vascular Lucky Cowboy, MD). No bleeding or significant bowel changes. Eating "ok"; weight down 8 pounds. Exam was stable. Hematocrit 41.9. Ferritin 98.   Patient underwent interval CT imaging of the chest, abdomen, and pelvis on 11/15/2017 that demonstrated stable post-treatment changes in the pre-sacral region/pelvis. No evidence of recurrent or metastatic disease. Stable 3 mm LLL and 4 mm lingular nodules observed. There were anterior abdominal wall hernias without evidence of obstructions.   In the interim, patient is doing "about the same". PVD symptoms in his lower extremities are unchanged. Patient denies any acute symptoms today. Patient denies that he has experienced any B symptoms. He denies any interval infections.   Patient advises that he maintains an adequate appetite. He is eating well. Weight today is 212 lb 2 oz (96.2 kg), which compared to his last visit to the clinic, represents a 7 pound weight loss. Patient notes that this is an intentional weight loss.    Patient complains of pain rated 4/10 in the clinic today.   Past Medical History:  Diagnosis Date  . Atherosclerosis   . Cancer ([place])   . Chronic kidney disease    kidney stones, UTI  . Colon cancer ([place])   . Coronary artery disease   . Diabetes mellitus without complication ([place])   . GERD (gastroesophageal reflux disease)   . Gout   . Hematuria   . Hyperlipidemia   . Hypertension   . Iron deficiency anemia   . Myocardial infarction ([place]) 2005  . Peripheral vascular disease ([place])   . Pulmonary nodules   . Rectal  cancer ([place])   . Ureter, stricture   . Wears dentures    full upper    Past Surgical History:  Procedure Laterality Date  . BOWEL RESECTION N/A 10/23/2014   Procedure: LOW ANTERIOR BOWEL RESECTION;  Surgeon: Sherri Rad, MD;  Location: [facility];  Service: General;  Laterality: N/A;  . COLON SURGERY    . COLONOSCOPY 06/06/14    . COLONOSCOPY WITH ESOPHAGOGASTRODUODENOSCOPY (EGD)    . CORONARY ANGIOPLASTY WITH STENT PLACEMENT  03/2004  . CYSTOSCOPY WITH STENT PLACEMENT Bilateral 10/23/2014   Procedure: CYSTOSCOPY WITH STENT PLACEMENT,URETHRAL DILATION, LEFT RETROGRADE PYELOGRAM, URETEROSCOPY;  Surgeon: Hollice Espy, MD;  Location: [facility];  Service: Urology;  Laterality: Bilateral;  . DIVERTING ILEOSTOMY N/A 10/23/2014   Procedure: DIVERTING ILEOSTOMY;  Surgeon: Sherri Rad, MD;  Location: [facility];  Service: General;  Laterality: N/A;  . ILEOSTOMY CLOSURE N/A 09/08/2015   Procedure: ILEOSTOMY TAKEDOWN;  Surgeon: Clayburn Pert, MD;  Location: [facility];  Service: General;  Laterality: N/A;  . LAPAROTOMY N/A 10/23/2014   Procedure: EXPLORATORY LAPAROTOMY;  Surgeon: Sherri Rad, MD;  Location: [facility];  Service: General;  Laterality: N/A;  . PERIPHERAL VASCULAR CATHETERIZATION N/A 05/12/2015   Procedure: Abdominal Aortogram w/Lower Extremity;  Surgeon: Algernon Huxley, MD;  Location: [place] [facility];  Service: Cardiovascular;  Laterality: N/A;  . PERIPHERAL VASCULAR CATHETERIZATION  05/12/2015   Procedure: Lower Extremity Intervention;  Surgeon: Algernon Huxley, MD;  Location: [facility] INVASIVE [facility]  [facility];  Service: Cardiovascular;;  . PERIPHERAL VASCULAR CATHETERIZATION N/A 06/30/2015   Procedure: Abdominal Aortogram w/Lower Extremity;  Surgeon: Algernon Huxley, MD;  Location: [place] [facility];  Service: Cardiovascular;  Laterality: N/A;  . PERIPHERAL VASCULAR CATHETERIZATION  06/30/2015   Procedure: Lower Extremity Intervention;  Surgeon: Algernon Huxley, MD;  Location: [place] [facility];  Service: Cardiovascular;;   . PORT-A-CATH REMOVAL Right 09/08/2015   Procedure: REMOVAL PORT-A-CATH;  Surgeon: Clayburn Pert, MD;  Location: [facility];  Service: General;  Laterality: Right;  . PORTACATH PLACEMENT  07/01/2014   Dr. Marina Gravel    Family History  Problem Relation Age of Onset  . Hypertension Brother   . Hypertension Father   . Diabetes Father   . Diabetes Brother   . Hypertension Brother   . Heart disease Paternal Grandfather     Social History:  reports that he quit smoking about 33 years ago. His smoking use included cigarettes. He has never used smokeless tobacco. He reports that he does not drink alcohol or use drugs.  He lives in [place].  He is working on disability.  The patient is alone today.   Allergies:  Allergies  Allergen Reactions  . No Known Allergies     Current Medications: Current Outpatient Medications  Medication Sig Dispense Refill  . acetaminophen (TYLENOL) 500 MG tablet Take 500 mg by mouth every 6 (six) hours as needed.    . Ascorbic Acid (VITAMIN C) 1000 MG tablet Take 1,000 mg by mouth daily. Reported on 09/26/2015    . aspirin EC 81 MG tablet Take 81 mg by mouth daily.     . cloNIDine (CATAPRES) 0.2 MG tablet Take 0.2 mg by mouth daily.    . clopidogrel (PLAVIX) 75 MG tablet     . colchicine 0.6 MG tablet Take 0.6 mg by mouth daily.    . ferrous sulfate 325 (65 FE) MG tablet Take 325 mg by mouth daily with breakfast.    . hydrochlorothiazide (MICROZIDE) 12.5 MG capsule     . metFORMIN (GLUCOPHAGE-XR) 500 MG 24 hr tablet Take 500 mg by mouth daily.    Marland Kitchen HYDROcodone-acetaminophen (NORCO/VICODIN) 5-325 MG tablet Take 1-2 tablets by mouth every 4 (four) hours as needed for moderate pain. (Patient not taking: Reported on 06/13/2017) 30 tablet 0   No current facility-administered medications for this visit.    Facility-Administered Medications Ordered in Other Visits  Medication Dose Route Frequency Provider Last Rate Last Dose  . heparin lock flush 100 unit/mL  500 Units  Intravenous Once Corcoran, Melissa C, MD      . sodium chloride 0.9 % injection 10 mL  10 mL Intravenous PRN Dallas Schimke, MD   10 mL at 08/26/14 1300  . sodium chloride flush (NS) 0.9 % injection 10 mL  10 mL Intravenous PRN Lequita Asal, MD        Review of Systems  Constitutional: Positive for weight loss (down 7 pounds; intentional). Negative for diaphoresis, fever and malaise/fatigue.       "I'm doing good".   HENT: Negative.   Eyes: Negative.   Respiratory: Negative for cough, hemoptysis, sputum production and shortness of breath.   Cardiovascular: Negative for chest pain, palpitations, orthopnea, leg swelling and PND.       PMH (+) for PVD.  Gastrointestinal: Negative for abdominal pain, blood in stool, constipation, diarrhea, melena, nausea and vomiting.  Genitourinary: Negative for dysuria, frequency, hematuria and urgency.  Musculoskeletal: Negative for back pain, falls,  joint pain and myalgias.       PMH (+) for gout.  Skin: Negative for itching and rash.  Neurological: Negative for dizziness, tremors, weakness and headaches.  Endo/Heme/Allergies: Does not bruise/bleed easily.  Psychiatric/Behavioral: Negative for depression, memory loss and suicidal ideas. The patient is not nervous/anxious and does not have insomnia.   All other systems reviewed and are negative.  Performance status (ECOG): 2 - Symptomatic, <50% confined to bed  Vital Signs BP 139/90 (BP Location: Left Arm, Patient Position: Sitting)   Pulse 61   Temp 97.8 F (36.6 C) (Tympanic)   Wt 212 lb 2 oz (96.2 kg)   BMI 34.24 kg/m   Physical Exam  Constitutional: He is oriented to person, place, and time and well-developed, well-nourished, and in no distress.  He has a cane at his side.  HENT:  Head: Normocephalic and atraumatic.  Dark hair. Thin mustache.  Eyes: Pupils are equal, round, and reactive to light. EOM are normal. No scleral icterus.  Brown eyes.   Neck: Normal range of motion.  Neck supple. No tracheal deviation present. No thyromegaly present.  Cardiovascular: Normal rate, regular rhythm and normal heart sounds. Exam reveals no gallop and no friction rub.  No murmur heard. Pulmonary/Chest: Effort normal and breath sounds normal. No respiratory distress. He has no wheezes. He has no rales.  Abdominal: Soft. Bowel sounds are normal. He exhibits no distension. There is no tenderness.  Musculoskeletal: Normal range of motion. He exhibits no edema or tenderness.  Lymphadenopathy:    He has no cervical adenopathy.    He has no axillary adenopathy.       Right: No inguinal and no supraclavicular adenopathy present.       Left: No inguinal and no supraclavicular adenopathy present.  Neurological: He is alert and oriented to person, place, and time.  Skin: Skin is warm and dry. No rash noted. No erythema.  Psychiatric: Mood, affect and judgment normal.  Nursing note and vitals reviewed.   Appointment on 11/21/2017  Component Date Value Ref Range Status  . Sodium 11/21/2017 138  135 - 145 mmol/L Final  . Potassium 11/21/2017 3.5  3.5 - 5.1 mmol/L Final  . Chloride 11/21/2017 99  98 - 111 mmol/L Final  . CO2 11/21/2017 26  22 - 32 mmol/L Final  . Glucose, Bld 11/21/2017 163* 70 - 99 mg/dL Final  . BUN 11/21/2017 31* 8 - 23 mg/dL Final  . Creatinine, Ser 11/21/2017 1.50* 0.61 - 1.24 mg/dL Final  . Calcium 11/21/2017 9.7  8.9 - 10.3 mg/dL Final  . Total Protein 11/21/2017 7.1  6.5 - 8.1 g/dL Final  . Albumin 11/21/2017 4.1  3.5 - 5.0 g/dL Final  . AST 11/21/2017 22  15 - 41 U/L Final  . ALT 11/21/2017 22  0 - 44 U/L Final  . Alkaline Phosphatase 11/21/2017 109  38 - 126 U/L Final  . Total Bilirubin 11/21/2017 0.6  0.3 - 1.2 mg/dL Final  . GFR calc non Af Amer 11/21/2017 48* >60 mL/min Final  . GFR calc Af Amer 11/21/2017 55* >60 mL/min Final   Comment: (NOTE) The eGFR has been calculated using the CKD EPI equation. This calculation has not been validated in all  clinical situations. eGFR's persistently <60 mL/min signify possible Chronic Kidney Disease.   Georgiann Hahn gap 11/21/2017 13  5 - 15 Final   Performed at [facility], [address] [place], [place] [address]  . WBC 11/21/2017 4.1  3.8 -  10.6 K/uL Final  . RBC 11/21/2017 5.46  4.40 - 5.90 MIL/uL Final  . Hemoglobin 11/21/2017 14.2  13.0 - 18.0 g/dL Final  . HCT 11/21/2017 43.4  40.0 - 52.0 % Final  . MCV 11/21/2017 79.6* 80.0 - 100.0 fL Final  . MCH 11/21/2017 26.0  26.0 - 34.0 pg Final  . MCHC 11/21/2017 32.7  32.0 - 36.0 g/dL Final  . RDW 11/21/2017 15.6* 11.5 - 14.5 % Final  . Platelets 11/21/2017 243  150 - 440 K/uL Final  . Neutrophils Relative % 11/21/2017 73  % Final  . Neutro Abs 11/21/2017 3.0  1.4 - 6.5 K/uL Final  . Lymphocytes Relative 11/21/2017 16  % Final  . Lymphs Abs 11/21/2017 0.7* 1.0 - 3.6 K/uL Final  . Monocytes Relative 11/21/2017 6  % Final  . Monocytes Absolute 11/21/2017 0.3  0.2 - 1.0 K/uL Final  . Eosinophils Relative 11/21/2017 4  % Final  . Eosinophils Absolute 11/21/2017 0.1  0 - 0.7 K/uL Final  . Basophils Relative 11/21/2017 1  % Final  . Basophils Absolute 11/21/2017 0.0  0 - 0.1 K/uL Final   Performed at [facility], [address] [place], [place] [address]    Assessment:  [name] KLUEVER [name] is a 62 y.o. male with clinical stage IIA (T3N0) rectal cancer.  Colonoscopy on 06/06/2014 revealed a large non-circumferential distal rectal mass. Biopsy was positive for adenocarcinoma.   CT scans revealed a 3 mm pulmonary nodule (indeterminate). There was a large rectal mass suspicious for transmural extension into the mesorectum.There was equivocal perirectal and sigmoid mesocolon nodes. There was no liver metastasis or extrapelvic disease.  Endoscopic ultrasound at [facility] revealed a 70% circumferential mass at 9-14 cm from the anal verge and measuring 9 mm in maximal thickness. Ultrasound suggested breakthrough of the muscularis  propria with invasion into the perirectal fat.  He received neoadjuvant chemotherapy (continuous infusion 5FU) and radiation. He received radiation from 07/03/2014 until 08/16/2014. He underwent low anterior resection with diverting loop ileostomy on 10/23/2014.  Pathology revealed a complete response.  Zero of 10 lymph nodes were positive.  He underwent ileostomy take-down on 09/08/2015.  He has iron deficiency anemia.  Ferritin was 34 on 10/04/2014, 134 on 08/31/2016, 218 on 01/03/2017, 255 on 02/02/2017, 98 on 05/17/2017, and 112 on 11/21/2017.  He is on oral iron (1 tablet/day).    He received 8 cycles of FOLFOX chemotherapy (12/27/2014 - 04/15/2015).  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    CEA has been followed:  2.1 on 08/26/2014, 3.6 on 04/15/2015, 2.5 on 06/23/2015, 3.2 on 09/26/2015, 2.8 on 12/29/2015, 2.7 on 04/22/2016, 3.6 on 08/31/2016, 2.8 on 01/03/2017, and 2.8 on 05/17/2017.  Chest, abdomen, and pelvic CT on 08/25/2016 revealed expected post treatment changes in presacral region.  There was no evidence of recurrent or metastatic carcinoma within the chest, abdomen, or pelvis.   Chest, abdomen, and pelvic CT on 11/15/2017 demonstrated stable post-treatment changes in the pre-sacral region/pelvis. No evidence of recurrent or metastatic disease. Stable 3 mm LLL and 4 mm lingular nodules observed. There were anterior abdominal wall hernias without evidence of obstruction.   He has significant peripheral vascular disease. He underwent angioplasty of the left mid to distal posterior tibial artery, above-knee popliteal artery and entire SFA on 05/12/2015.  He underwent stent 2 to the left SFA  and popliteal artery for multiple areas of greater than 50% residual stenosis after angioplasty.    He has  a history of increased liver function tests.  He denies any new medications.  He does not drink alcohol.  Hepatitis B surface antigen, hepatitis B core antibody  total, and hepatitis C antibody were negative on 08/31/2016.  He has a mild microcytic anemia.  Ferritin was 255 with a sed rate of 58 on 02/02/2017.  B12 was 208 on 02/02/2017 and 222 on 05/17/2017.  Folate was normal.  MMA was normal on 02/03/2017.   Symptomatically, he is doing well. He denies acute symptoms. PVD in his lower extremities remains changed. No B symptoms or interval infections. Exam is grossly unremarkable. Hematocrit is 43.5.  BUN 31 and creatinine 1.50 (CrCl 54.8 mL/min).  Plan: 1. Labs today:  CBC with diff, CMP, CEA, ferritin. 2. Rectal cancer - surveillance  Patient doing well. He denies bleeding and bowel changes.   CT imaging from 11/15/2017 personally reviewed by primary oncologist. Results revealed:  Stable post-treatment changes in the pre-sacral region/pelvis, with no evidence of recurrent or metastatic disease.   Stable 3 mm LLL and 4 mm lingular nodules observed.   There were anterior abdominal wall hernias without evidence of obstruction.   Discuss need for follow up GI. Last colonoscopy was done in 2016.  3. Decreased renal function - stable  BUN 31 and creatinine 1.50 (CrCl 54.8 mL/min).   Attributed to recent CT contrast administration.   Encouraged to increase fluid intake. Will discuss with PCP to recheck renal function in 2 weeks.   4. Microcytic anemia - stable  Hemoglobin 14.2, hematocrit 43.4, and MVC 79.6.  Continue on daily oral iron with a source of vitamin C.  5. RTC in 6 months for MD assessment, labs (CBC with diff, CMP, ferritin, CEA).  Addendum:  Recheck B12.     Honor Loh, NP  11/21/2017, 10:53 AM   I saw and evaluated the patient, participating in the key portions of the service and reviewing pertinent diagnostic studies and records.  I reviewed the nurse practitioner's note and agree with the findings and the plan.  The assessment and plan were discussed with the patient.  A few questions were asked by the patient and  answered.   Lequita Asal, MD  11/21/2017, 10:53 AM

## 2017-11-21 ENCOUNTER — Encounter: Payer: Self-pay | Admitting: Hematology and Oncology

## 2017-11-21 ENCOUNTER — Inpatient Hospital Stay: Payer: 59 | Attending: Hematology and Oncology

## 2017-11-21 ENCOUNTER — Inpatient Hospital Stay: Payer: 59 | Admitting: Hematology and Oncology

## 2017-11-21 VITALS — BP 139/90 | HR 61 | Temp 97.8°F | Wt 212.1 lb

## 2017-11-21 DIAGNOSIS — I129 Hypertensive chronic kidney disease with stage 1 through stage 4 chronic kidney disease, or unspecified chronic kidney disease: Secondary | ICD-10-CM | POA: Diagnosis not present

## 2017-11-21 DIAGNOSIS — Z87891 Personal history of nicotine dependence: Secondary | ICD-10-CM | POA: Insufficient documentation

## 2017-11-21 DIAGNOSIS — E1122 Type 2 diabetes mellitus with diabetic chronic kidney disease: Secondary | ICD-10-CM | POA: Diagnosis not present

## 2017-11-21 DIAGNOSIS — D509 Iron deficiency anemia, unspecified: Secondary | ICD-10-CM | POA: Insufficient documentation

## 2017-11-21 DIAGNOSIS — N189 Chronic kidney disease, unspecified: Secondary | ICD-10-CM

## 2017-11-21 DIAGNOSIS — I2510 Atherosclerotic heart disease of native coronary artery without angina pectoris: Secondary | ICD-10-CM | POA: Insufficient documentation

## 2017-11-21 DIAGNOSIS — C20 Malignant neoplasm of rectum: Secondary | ICD-10-CM

## 2017-11-21 DIAGNOSIS — Z79899 Other long term (current) drug therapy: Secondary | ICD-10-CM | POA: Insufficient documentation

## 2017-11-21 DIAGNOSIS — Z7982 Long term (current) use of aspirin: Secondary | ICD-10-CM | POA: Insufficient documentation

## 2017-11-21 DIAGNOSIS — M109 Gout, unspecified: Secondary | ICD-10-CM | POA: Insufficient documentation

## 2017-11-21 DIAGNOSIS — E1151 Type 2 diabetes mellitus with diabetic peripheral angiopathy without gangrene: Secondary | ICD-10-CM | POA: Diagnosis not present

## 2017-11-21 DIAGNOSIS — I252 Old myocardial infarction: Secondary | ICD-10-CM | POA: Insufficient documentation

## 2017-11-21 LAB — COMPREHENSIVE METABOLIC PANEL
ALT: 22 U/L (ref 0–44)
AST: 22 U/L (ref 15–41)
Albumin: 4.1 g/dL (ref 3.5–5.0)
Alkaline Phosphatase: 109 U/L (ref 38–126)
Anion gap: 13 (ref 5–15)
BUN: 31 mg/dL — ABNORMAL HIGH (ref 8–23)
CO2: 26 mmol/L (ref 22–32)
Calcium: 9.7 mg/dL (ref 8.9–10.3)
Chloride: 99 mmol/L (ref 98–111)
Creatinine, Ser: 1.5 mg/dL — ABNORMAL HIGH (ref 0.61–1.24)
GFR calc Af Amer: 55 mL/min — ABNORMAL LOW (ref >60)
GFR calc non Af Amer: 48 mL/min — ABNORMAL LOW (ref >60)
Glucose, Bld: 163 mg/dL — ABNORMAL HIGH (ref 70–99)
Potassium: 3.5 mmol/L (ref 3.5–5.1)
Sodium: 138 mmol/L (ref 135–145)
Total Bilirubin: 0.6 mg/dL (ref 0.3–1.2)
Total Protein: 7.1 g/dL (ref 6.5–8.1)

## 2017-11-21 LAB — CBC WITH DIFFERENTIAL/PLATELET
Basophils Absolute: 0 10*3/uL (ref 0–0.1)
Basophils Relative: 1 %
Eosinophils Absolute: 0.1 10*3/uL (ref 0–0.7)
Eosinophils Relative: 4 %
HCT: 43.4 % (ref 40.0–52.0)
Hemoglobin: 14.2 g/dL (ref 13.0–18.0)
Lymphocytes Relative: 16 %
Lymphs Abs: 0.7 10*3/uL — ABNORMAL LOW (ref 1.0–3.6)
MCH: 26 pg (ref 26.0–34.0)
MCHC: 32.7 g/dL (ref 32.0–36.0)
MCV: 79.6 fL — ABNORMAL LOW (ref 80.0–100.0)
Monocytes Absolute: 0.3 10*3/uL (ref 0.2–1.0)
Monocytes Relative: 6 %
Neutro Abs: 3 10*3/uL (ref 1.4–6.5)
Neutrophils Relative %: 73 %
Platelets: 243 10*3/uL (ref 150–440)
RBC: 5.46 MIL/uL (ref 4.40–5.90)
RDW: 15.6 % — ABNORMAL HIGH (ref 11.5–14.5)
WBC: 4.1 10*3/uL (ref 3.8–10.6)

## 2017-11-21 LAB — FERRITIN: Ferritin: 112 ng/mL (ref 24–336)

## 2017-11-21 NOTE — Progress Notes (Signed)
Patient states he is here today for CT results.  States he is urinating more.  Otherwise, offers no complaints.

## 2017-11-22 ENCOUNTER — Telehealth: Payer: Self-pay | Admitting: *Deleted

## 2017-11-22 LAB — CEA: CEA: 4.1 ng/mL (ref 0.0–4.7)

## 2017-11-22 NOTE — Telephone Encounter (Signed)
Called patient and LVM to inform him that MD states it is okay to stop his oral iron.

## 2017-11-22 NOTE — Telephone Encounter (Signed)
-----   Message from Lequita Asal, MD sent at 11/22/2017  7:13 AM EDT ----- Regarding: Please call patient  OK to stop oral iron.  M  ----- Message ----- From: Interface, Lab In [place] Sent: 11/21/2017   9:47 AM To: Lequita Asal, MD

## 2017-12-26 ENCOUNTER — Telehealth: Payer: Self-pay | Admitting: *Deleted

## 2017-12-26 ENCOUNTER — Other Ambulatory Visit: Payer: Self-pay | Admitting: *Deleted

## 2017-12-26 DIAGNOSIS — C20 Malignant neoplasm of rectum: Secondary | ICD-10-CM

## 2017-12-26 NOTE — Telephone Encounter (Signed)
Spoke with patient at clinic today (came with his wife).  Patient states he can come in the morning.  Sent message to scheduling.

## 2017-12-27 ENCOUNTER — Inpatient Hospital Stay: Payer: 59 | Attending: Hematology and Oncology

## 2017-12-27 DIAGNOSIS — C20 Malignant neoplasm of rectum: Secondary | ICD-10-CM | POA: Diagnosis not present

## 2017-12-27 DIAGNOSIS — D509 Iron deficiency anemia, unspecified: Secondary | ICD-10-CM | POA: Diagnosis not present

## 2017-12-27 LAB — FOLATE: Folate: 24 ng/mL (ref >5.9)

## 2017-12-27 LAB — VITAMIN B12: Vitamin B-12: 180 pg/mL (ref 180–914)

## 2017-12-28 ENCOUNTER — Telehealth: Payer: Self-pay | Admitting: *Deleted

## 2017-12-28 ENCOUNTER — Other Ambulatory Visit: Payer: Self-pay | Admitting: *Deleted

## 2017-12-28 DIAGNOSIS — C20 Malignant neoplasm of rectum: Secondary | ICD-10-CM

## 2017-12-28 NOTE — Telephone Encounter (Signed)
Called patient to inform him that his B-12 is low.  MD recommends either oral B-12 or injections.  Patient prefers oral B-12.  He will go ahead and get medication.  Informed him that we will recheck in one month.  Verbalized understanding.

## 2017-12-28 NOTE — Telephone Encounter (Signed)
-----   Message from Lequita Asal, MD sent at 12/27/2017  7:08 PM EDT ----- Regarding: Please call patient  B12 deficient.  Can choose oral B12 or injections.  If oral, then recheck B12 level in 1 month.  M  ----- Message ----- From: Buel Ream, Lab In [place] Sent: 12/27/2017  11:04 AM EDT To: Lequita Asal, MD

## 2018-01-26 ENCOUNTER — Inpatient Hospital Stay: Payer: 59

## 2018-01-30 ENCOUNTER — Inpatient Hospital Stay: Payer: 59 | Attending: Hematology and Oncology

## 2018-01-30 DIAGNOSIS — C20 Malignant neoplasm of rectum: Secondary | ICD-10-CM | POA: Insufficient documentation

## 2018-01-30 LAB — VITAMIN B12: Vitamin B-12: 772 pg/mL (ref 180–914)

## 2018-05-26 ENCOUNTER — Encounter: Payer: Self-pay | Admitting: Hematology and Oncology

## 2018-05-26 ENCOUNTER — Inpatient Hospital Stay: Payer: 59 | Attending: Hematology and Oncology

## 2018-05-26 ENCOUNTER — Inpatient Hospital Stay (HOSPITAL_BASED_OUTPATIENT_CLINIC_OR_DEPARTMENT_OTHER): Payer: 59 | Admitting: Hematology and Oncology

## 2018-05-26 VITALS — BP 118/76 | HR 54 | Temp 97.8°F | Resp 18 | Wt 216.8 lb

## 2018-05-26 DIAGNOSIS — E538 Deficiency of other specified B group vitamins: Secondary | ICD-10-CM | POA: Diagnosis not present

## 2018-05-26 DIAGNOSIS — D509 Iron deficiency anemia, unspecified: Secondary | ICD-10-CM | POA: Diagnosis not present

## 2018-05-26 DIAGNOSIS — N189 Chronic kidney disease, unspecified: Secondary | ICD-10-CM | POA: Diagnosis not present

## 2018-05-26 DIAGNOSIS — C20 Malignant neoplasm of rectum: Secondary | ICD-10-CM | POA: Diagnosis not present

## 2018-05-26 LAB — COMPREHENSIVE METABOLIC PANEL
ALT: 26 U/L (ref 0–44)
AST: 22 U/L (ref 15–41)
Albumin: 4.1 g/dL (ref 3.5–5.0)
Alkaline Phosphatase: 110 U/L (ref 38–126)
Anion gap: 7 (ref 5–15)
BUN: 21 mg/dL (ref 8–23)
CO2: 27 mmol/L (ref 22–32)
Calcium: 8.9 mg/dL (ref 8.9–10.3)
Chloride: 100 mmol/L (ref 98–111)
Creatinine, Ser: 1.23 mg/dL (ref 0.61–1.24)
GFR calc Af Amer: >60 mL/min (ref >60)
GFR calc non Af Amer: >60 mL/min (ref >60)
Glucose, Bld: 171 mg/dL — ABNORMAL HIGH (ref 70–99)
Potassium: 3.4 mmol/L — ABNORMAL LOW (ref 3.5–5.1)
Sodium: 134 mmol/L — ABNORMAL LOW (ref 135–145)
Total Bilirubin: 0.6 mg/dL (ref 0.3–1.2)
Total Protein: 6.9 g/dL (ref 6.5–8.1)

## 2018-05-26 LAB — CBC WITH DIFFERENTIAL/PLATELET
Abs Immature Granulocytes: 0.02 10*3/uL (ref 0.00–0.07)
Basophils Absolute: 0 10*3/uL (ref 0.0–0.1)
Basophils Relative: 1 %
Eosinophils Absolute: 0.1 10*3/uL (ref 0.0–0.5)
Eosinophils Relative: 3 %
HCT: 43 % (ref 39.0–52.0)
Hemoglobin: 13.4 g/dL (ref 13.0–17.0)
Immature Granulocytes: 0 %
Lymphocytes Relative: 19 %
Lymphs Abs: 0.9 10*3/uL (ref 0.7–4.0)
MCH: 24.8 pg — ABNORMAL LOW (ref 26.0–34.0)
MCHC: 31.2 g/dL (ref 30.0–36.0)
MCV: 79.6 fL — ABNORMAL LOW (ref 80.0–100.0)
Monocytes Absolute: 0.3 10*3/uL (ref 0.1–1.0)
Monocytes Relative: 7 %
Neutro Abs: 3.3 10*3/uL (ref 1.7–7.7)
Neutrophils Relative %: 70 %
Platelets: 214 10*3/uL (ref 150–400)
RBC: 5.4 MIL/uL (ref 4.22–5.81)
RDW: 14.6 % (ref 11.5–15.5)
WBC: 4.7 10*3/uL (ref 4.0–10.5)
nRBC: 0 % (ref 0.0–0.2)

## 2018-05-26 LAB — FERRITIN: Ferritin: 96 ng/mL (ref 24–336)

## 2018-05-26 NOTE — Progress Notes (Signed)
No new changes noted today 

## 2018-05-26 NOTE — Progress Notes (Signed)
[facility] day:  05/26/18   Chief Complaint: [name] FENOGLIO [name] is a 63 y.o. male with clinical stage IIA rectal cancer who is seen for 6 month assessment.  HPI:  The patient was last seen in the medical oncology clinic on 11/21/2017.  At that time, he was doing well. He denied acute symptoms. PVD in his lower extremities remained changed. No B symptoms or interval infections. Exam was grossly unremarkable. Hematocrit was 43.5.  BUN 31 and creatinine 1.50 (CrCl 54.8 mL/min).  B12 was 180 on 12/27/2017.  He was started on oral B12.  B12 was 772 on 01/30/2018.  During the interim, he has felt "ok".  He denies any Gi symptoms.  He notes his "bowels are good".  He denies any abdominal pain, melena or hematochezia.  His legs feel "about the same".   Past Medical History:  Diagnosis Date  . Atherosclerosis   . Cancer ([place])   . Chronic kidney disease    kidney stones, UTI  . Colon cancer ([place])   . Coronary artery disease   . Diabetes mellitus without complication ([place])   . GERD (gastroesophageal reflux disease)   . Gout   . Hematuria   . Hyperlipidemia   . Hypertension   . Iron deficiency anemia   . Myocardial infarction ([place]) 2005  . Peripheral vascular disease ([place])   . Pulmonary nodules   . Rectal cancer ([place])   . Ureter, stricture   . Wears dentures    full upper    Past Surgical History:  Procedure Laterality Date  . BOWEL RESECTION N/A 10/23/2014   Procedure: LOW ANTERIOR BOWEL RESECTION;  Surgeon: Sherri Rad, MD;  Location: [facility];  Service: General;  Laterality: N/A;  . COLON SURGERY    . COLONOSCOPY 06/06/14    . COLONOSCOPY WITH ESOPHAGOGASTRODUODENOSCOPY (EGD)    . CORONARY ANGIOPLASTY WITH STENT PLACEMENT  03/2004  . CYSTOSCOPY WITH STENT PLACEMENT Bilateral 10/23/2014   Procedure: CYSTOSCOPY WITH STENT PLACEMENT,URETHRAL DILATION, LEFT RETROGRADE PYELOGRAM, URETEROSCOPY;  Surgeon: Hollice Espy, MD;  Location: [facility];   Service: Urology;  Laterality: Bilateral;  . DIVERTING ILEOSTOMY N/A 10/23/2014   Procedure: DIVERTING ILEOSTOMY;  Surgeon: Sherri Rad, MD;  Location: [facility];  Service: General;  Laterality: N/A;  . ILEOSTOMY CLOSURE N/A 09/08/2015   Procedure: ILEOSTOMY TAKEDOWN;  Surgeon: Clayburn Pert, MD;  Location: [facility];  Service: General;  Laterality: N/A;  . LAPAROTOMY N/A 10/23/2014   Procedure: EXPLORATORY LAPAROTOMY;  Surgeon: Sherri Rad, MD;  Location: [facility];  Service: General;  Laterality: N/A;  . PERIPHERAL VASCULAR CATHETERIZATION N/A 05/12/2015   Procedure: Abdominal Aortogram w/Lower Extremity;  Surgeon: Algernon Huxley, MD;  Location: [facility];  Service: Cardiovascular;  Laterality: N/A;  . PERIPHERAL VASCULAR CATHETERIZATION  05/12/2015   Procedure: Lower Extremity Intervention;  Surgeon: Algernon Huxley, MD;  Location: [facility];  Service: Cardiovascular;;  . PERIPHERAL VASCULAR CATHETERIZATION N/A 06/30/2015   Procedure: Abdominal Aortogram w/Lower Extremity;  Surgeon: Algernon Huxley, MD;  Location: [facility];  Service: Cardiovascular;  Laterality: N/A;  . PERIPHERAL VASCULAR CATHETERIZATION  06/30/2015   Procedure: Lower Extremity Intervention;  Surgeon: Algernon Huxley, MD;  Location: [facility];  Service: Cardiovascular;;  . PORT-A-CATH REMOVAL Right 09/08/2015   Procedure: REMOVAL PORT-A-CATH;  Surgeon: Clayburn Pert, MD;  Location: [facility];  Service: General;  Laterality: Right;  . PORTACATH PLACEMENT  07/01/2014   Dr.  Bird    Family History  Problem Relation Age of Onset  . Hypertension Brother   . Hypertension Father   . Diabetes Father   . Diabetes Brother   . Hypertension Brother   . Heart disease Paternal Grandfather     Social History:  reports that he quit smoking about 33 years ago. His smoking use included cigarettes. He has never used smokeless tobacco. He reports that he does not drink alcohol or use drugs.  He lives in [place].  He  is working on disability.  The patient is alone today.   Allergies:  Allergies  Allergen Reactions  . No Known Allergies     Current Medications: Current Outpatient Medications  Medication Sig Dispense Refill  . Ascorbic Acid (VITAMIN C) 1000 MG tablet Take 1,000 mg by mouth daily. Reported on 09/26/2015    . aspirin EC 81 MG tablet Take 81 mg by mouth daily.     . cloNIDine (CATAPRES) 0.2 MG tablet Take 0.2 mg by mouth daily.    . clopidogrel (PLAVIX) 75 MG tablet     . colchicine 0.6 MG tablet Take 0.6 mg by mouth daily.    . ferrous sulfate 325 (65 FE) MG tablet Take 325 mg by mouth daily with breakfast.    . hydrochlorothiazide (MICROZIDE) 12.5 MG capsule     . metFORMIN (GLUCOPHAGE-XR) 500 MG 24 hr tablet Take 500 mg by mouth daily.    Marland Kitchen acetaminophen (TYLENOL) 500 MG tablet Take 500 mg by mouth every 6 (six) hours as needed.    Marland Kitchen HYDROcodone-acetaminophen (NORCO/VICODIN) 5-325 MG tablet Take 1-2 tablets by mouth every 4 (four) hours as needed for moderate pain. (Patient not taking: Reported on 06/13/2017) 30 tablet 0   No current facility-administered medications for this visit.    Facility-Administered Medications Ordered in Other Visits  Medication Dose Route Frequency Provider Last Rate Last Dose  . heparin lock flush 100 unit/mL  500 Units Intravenous Once Corcoran, Melissa C, MD      . sodium chloride 0.9 % injection 10 mL  10 mL Intravenous PRN Dallas Schimke, MD   10 mL at 08/26/14 1300  . sodium chloride flush (NS) 0.9 % injection 10 mL  10 mL Intravenous PRN Lequita Asal, MD        Review of Systems  Constitutional: Negative.  Negative for chills, diaphoresis, fever, malaise/fatigue and weight loss (up 4 pounds).       Feels "ok".  HENT: Negative.  Negative for congestion, ear pain, nosebleeds, sinus pain and sore throat.   Eyes: Negative for blurred vision, double vision, photophobia and pain.  Respiratory: Negative.  Negative for cough, hemoptysis, sputum  production and shortness of breath.   Cardiovascular: Negative for chest pain, palpitations, orthopnea, leg swelling and PND.       Peripheral vascular disease.  Gastrointestinal: Negative for abdominal pain, blood in stool, constipation, diarrhea, melena, nausea and vomiting.  Genitourinary: Negative.  Negative for dysuria, frequency, hematuria and urgency.  Musculoskeletal: Negative for back pain, falls, joint pain, myalgias and neck pain.       No interval gout.  Skin: Negative.  Negative for itching and rash.  Neurological: Negative.  Negative for dizziness, tremors, speech change, focal weakness, weakness and headaches.  Endo/Heme/Allergies: Negative.  Does not bruise/bleed easily.  Psychiatric/Behavioral: Negative.  Negative for depression and memory loss. The patient is not nervous/anxious and does not have insomnia.   All other systems reviewed and are negative.  Performance status (  ECOG): 2  Vital Signs BP 118/76 (BP Location: Left Arm, Patient Position: Sitting)   Pulse (!) 54   Temp 97.8 F (36.6 C) (Tympanic)   Resp 18   Wt 216 lb 12.8 oz (98.3 kg)   BMI 34.99 kg/m   Physical Exam  Constitutional: He is oriented to person, place, and time and well-developed, well-nourished, and in no distress. No distress.  He has a cane at his side.  HENT:  Head: Normocephalic and atraumatic.  Mouth/Throat: Oropharynx is clear and moist. No oropharyngeal exudate.  Short graying hair. Thin mustache.  Eyes: Pupils are equal, round, and reactive to light. Conjunctivae and EOM are normal. No scleral icterus.  Brown eyes.   Neck: Normal range of motion. Neck supple. No JVD present.  Cardiovascular: Normal rate, regular rhythm and normal heart sounds. Exam reveals no gallop and no friction rub.  No murmur heard. Pulmonary/Chest: Effort normal and breath sounds normal. No respiratory distress. He has no wheezes. He has no rales.  Abdominal: Soft. Bowel sounds are normal. He exhibits no  distension and no mass. There is no abdominal tenderness. There is no rebound and no guarding.  Musculoskeletal: Normal range of motion.        General: No tenderness or edema.  Lymphadenopathy:    He has no cervical adenopathy.    He has no axillary adenopathy.       Right: No inguinal and no supraclavicular adenopathy present.       Left: No inguinal and no supraclavicular adenopathy present.  Neurological: He is alert and oriented to person, place, and time.  Skin: Skin is warm and dry. No rash noted. He is not diaphoretic. No erythema. No pallor.  Psychiatric: Mood, affect and judgment normal.  Nursing note and vitals reviewed.   Appointment on 05/26/2018  Component Date Value Ref Range Status  . CEA 05/26/2018 2.6  0.0 - 4.7 ng/mL Final   Comment: (NOTE)                             Nonsmokers          <3.9                             Smokers             <5.6 [employer] Electrochemiluminescence Immunoassay (ECLIA) Values obtained with different assay methods or kits cannot be used interchangeably.  Results cannot be interpreted as absolute evidence of the presence or absence of malignant disease. Performed At: [facility] [place], [place] [id number] Rush Farmer MD IN:[id number]   . Ferritin 05/26/2018 96  24 - 336 ng/mL Final   Performed at [facility], [address] [place], [place] [address]  . Sodium 05/26/2018 134* 135 - 145 mmol/L Final  . Potassium 05/26/2018 3.4* 3.5 - 5.1 mmol/L Final  . Chloride 05/26/2018 100  98 - 111 mmol/L Final  . CO2 05/26/2018 27  22 - 32 mmol/L Final  . Glucose, Bld 05/26/2018 171* 70 - 99 mg/dL Final  . BUN 05/26/2018 21  8 - 23 mg/dL Final  . Creatinine, Ser 05/26/2018 1.23  0.61 - 1.24 mg/dL Final  . Calcium 05/26/2018 8.9  8.9 - 10.3 mg/dL Final  . Total Protein 05/26/2018 6.9  6.5 - 8.1 g/dL Final  . Albumin 05/26/2018 4.1  3.5 - 5.0 g/dL Final  .  AST 05/26/2018 22  15 - 41 U/L Final   . ALT 05/26/2018 26  0 - 44 U/L Final  . Alkaline Phosphatase 05/26/2018 110  38 - 126 U/L Final  . Total Bilirubin 05/26/2018 0.6  0.3 - 1.2 mg/dL Final  . GFR calc non Af Amer 05/26/2018 >60  >60 mL/min Final  . GFR calc Af Amer 05/26/2018 >60  >60 mL/min Final  . Anion gap 05/26/2018 7  5 - 15 Final   Performed at [facility], [address] [place], [place] [address]  . WBC 05/26/2018 4.7  4.0 - 10.5 K/uL Final  . RBC 05/26/2018 5.40  4.22 - 5.81 MIL/uL Final  . Hemoglobin 05/26/2018 13.4  13.0 - 17.0 g/dL Final  . HCT 05/26/2018 43.0  39.0 - 52.0 % Final  . MCV 05/26/2018 79.6* 80.0 - 100.0 fL Final  . MCH 05/26/2018 24.8* 26.0 - 34.0 pg Final  . MCHC 05/26/2018 31.2  30.0 - 36.0 g/dL Final  . RDW 05/26/2018 14.6  11.5 - 15.5 % Final  . Platelets 05/26/2018 214  150 - 400 K/uL Final  . nRBC 05/26/2018 0.0  0.0 - 0.2 % Final  . Neutrophils Relative % 05/26/2018 70  % Final  . Neutro Abs 05/26/2018 3.3  1.7 - 7.7 K/uL Final  . Lymphocytes Relative 05/26/2018 19  % Final  . Lymphs Abs 05/26/2018 0.9  0.7 - 4.0 K/uL Final  . Monocytes Relative 05/26/2018 7  % Final  . Monocytes Absolute 05/26/2018 0.3  0.1 - 1.0 K/uL Final  . Eosinophils Relative 05/26/2018 3  % Final  . Eosinophils Absolute 05/26/2018 0.1  0.0 - 0.5 K/uL Final  . Basophils Relative 05/26/2018 1  % Final  . Basophils Absolute 05/26/2018 0.0  0.0 - 0.1 K/uL Final  . Immature Granulocytes 05/26/2018 0  % Final  . Abs Immature Granulocytes 05/26/2018 0.02  0.00 - 0.07 K/uL Final   Performed at [facility], [address] [place], [place] [address]    Assessment:  JAMERION CABELLO [name] is a 63 y.o. male with clinical stage IIA (T3N0) rectal cancer.  Colonoscopy on 06/06/2014 revealed a large non-circumferential distal rectal mass. Biopsy was positive for adenocarcinoma.   CT scans revealed a 3 mm pulmonary nodule (indeterminate). There was a large rectal mass suspicious for transmural extension into  the mesorectum.There was equivocal perirectal and sigmoid mesocolon nodes. There was no liver metastasis or extrapelvic disease.  Endoscopic ultrasound at [facility] revealed a 70% circumferential mass at 9-14 cm from the anal verge and measuring 9 mm in maximal thickness. Ultrasound suggested breakthrough of the muscularis propria with invasion into the perirectal fat.  He received neoadjuvant chemotherapy (continuous infusion 5FU) and radiation. He received radiation from 07/03/2014 until 08/16/2014. He underwent low anterior resection with diverting loop ileostomy on 10/23/2014.  Pathology revealed a complete response.  Zero of 10 lymph nodes were positive.  He underwent ileostomy take-down on 09/08/2015.  He has iron deficiency anemia.  Ferritin was 34 on 10/04/2014, 134 on 08/31/2016, 218 on 01/03/2017, 255 on 02/02/2017, 98 on 05/17/2017, and 112 on 11/21/2017.  He is on oral iron (1 tablet/day).    He received 8 cycles of FOLFOX chemotherapy (12/27/2014 - 04/15/2015).  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    CEA has been followed:  2.1 on 08/26/2014, 3.6 on 04/15/2015, 2.5 on 06/23/2015, 3.2 on 09/26/2015, 2.8 on 12/29/2015, 2.7 on 04/22/2016, 3.6 on 08/31/2016, 2.8 on 01/03/2017,  2.8 on 05/17/2017, 4.1 on 11/21/2017, and 2.6 on 05/26/2018.  Chest, abdomen, and pelvic CT on 08/25/2016 revealed expected post treatment changes in presacral region.  There was no evidence of recurrent or metastatic carcinoma within the chest, abdomen, or pelvis.   Chest, abdomen, and pelvic CT on 11/15/2017 demonstrated stable post-treatment changes in the pre-sacral region/pelvis. No evidence of recurrent or metastatic disease. Stable 3 mm LLL and 4 mm lingular nodules observed. There were anterior abdominal wall hernias without evidence of obstruction.   He has significant peripheral vascular disease. He underwent angioplasty of the left mid to distal posterior tibial artery,  above-knee popliteal artery and entire SFA on 05/12/2015.  He underwent stent 2 to the left SFA  and popliteal artery for multiple areas of greater than 50% residual stenosis after angioplasty.    He has a history of increased liver function tests.  He denies any new medications.  He does not drink alcohol.  Hepatitis B surface antigen, hepatitis B core antibody total, and hepatitis C antibody were negative on 08/31/2016.  He has a mild microcytic anemia.  Ferritin was 255 with a sed rate of 58 on 02/02/2017.  Ferritin was 98 on 05/17/2017, 112 on 11/21/2017, and 96 on 05/26/2018.  He has B12 deficiency.  B12 was 180 on 12/27/2017.  He is on oral B12.  B12 was 772 on 01/30/2018.  Folate was 24 on 12/27/2017.  Symptomatically, he denies any Gi complaints.  Exam is unremarkable.  CEA is normal.  Plan: 1. Labs today:  CBC with diff, CMP, ferritin, CEA. 2. Rectal cancer Clinically, he is doing well. He denies any GI symptoms.  Exam is unremarkable. Discuss need for colonoscopy. Encourage follow-up with Dr Allen Norris.  Discuss plan for follow-up CT scans in 10/2018. 3. Renal insufficiency BUN 21.  Creatinine 1.23 (improved).  Increase in creatinine with last visit felt secondary to IV contrast dye.  4. Microcytic anemia Hematocrit 43.0.  Hemoglobin 13.4.  MVC 79.6. Ferritin 96. Discontinue oral iron. Continue iron rich food.  5.   B12 deficiency  Monitor B12 and folate periodically. 6.   Chest, abdomen, and pelvic CT on 11/16/2018. 7.   RTC on 11/20/2018 for MD assessment, labs (CBC with diff, CMP, CEA), and review of imaging.  I discussed the assessment and treatment plan with the patient.  The patient was provided an opportunity to ask questions and all were answered.  The patient agreed with the plan and demonstrated an understanding of the instructions.  The patient was advised to call back or seek an in person evaluation if the symptoms worsen or if the condition fails to improve as  anticipated.    Lequita Asal, MD  05/26/2018, 4:38 PM

## 2018-05-27 LAB — CEA: CEA: 2.6 ng/mL (ref 0.0–4.7)

## 2018-06-12 ENCOUNTER — Encounter (INDEPENDENT_AMBULATORY_CARE_PROVIDER_SITE_OTHER): Payer: 59

## 2018-06-12 ENCOUNTER — Ambulatory Visit (INDEPENDENT_AMBULATORY_CARE_PROVIDER_SITE_OTHER): Payer: 59 | Admitting: Vascular Surgery

## 2018-06-30 IMAGING — CT CT ABD-PELV W/O CM
1 of 2 series · 12 of 32 positions shown, 15 images · non-contrast
Comparison: 06/14/2014

CLINICAL DATA: Followup rectal carcinoma. Previous surgery,
chemotherapy, and radiation therapy.

EXAM:
CT CHEST, ABDOMEN AND PELVIS WITHOUT CONTRAST
TECHNIQUE: Multidetector CT imaging of the chest, abdomen and pelvis was
performed following the standard protocol without IV contrast.

[Series 2: axial st · axial · 0.82mm/px · z∈[-887,-387]mm · 12 of 124 slices shown, 15 images]
[im 12/124  mediastinal]
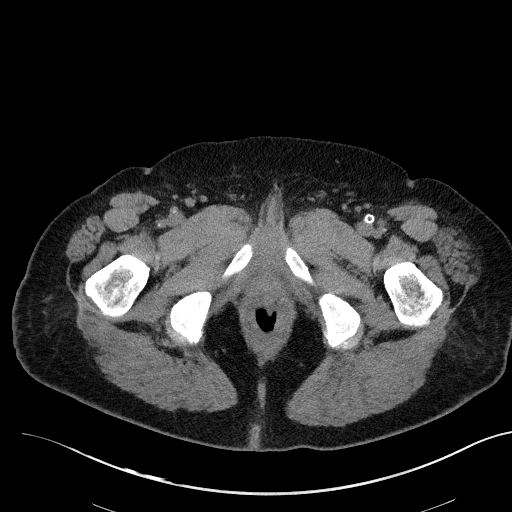
[im 12/124  lung]
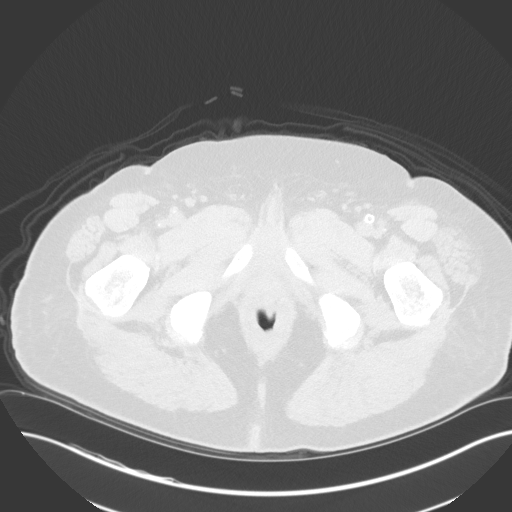
[im 23/124  lung]
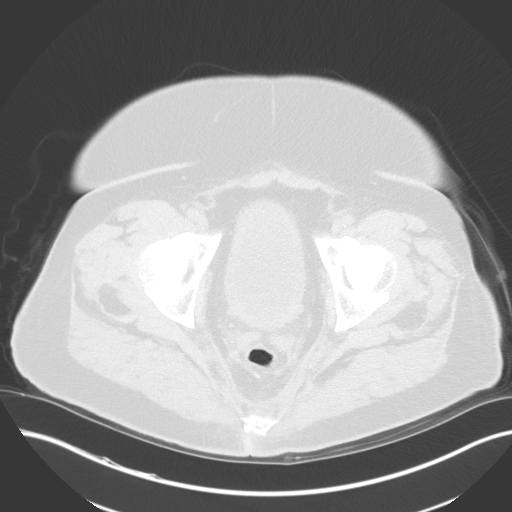
[im 34/124  lung]
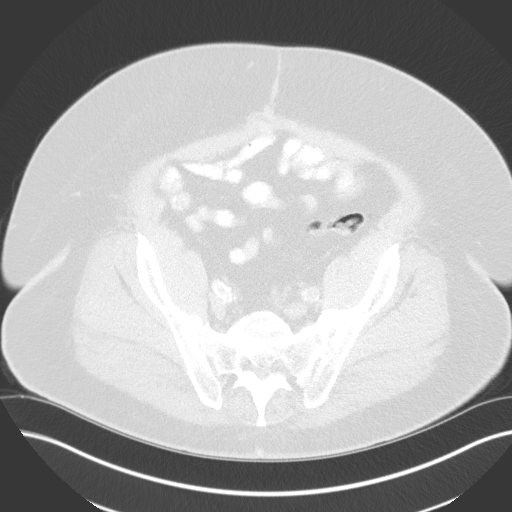
[im 45/124  lung]
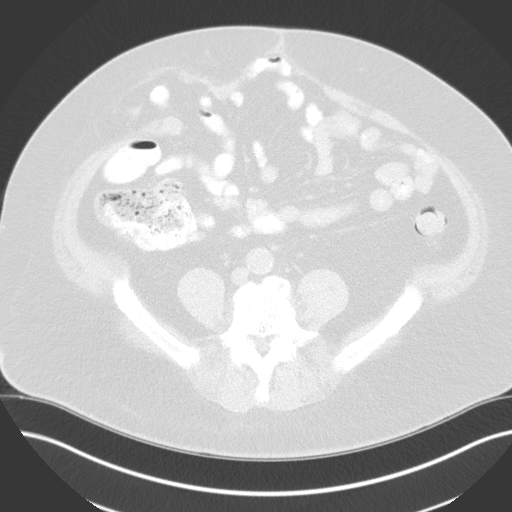
[im 56/124  mediastinal]
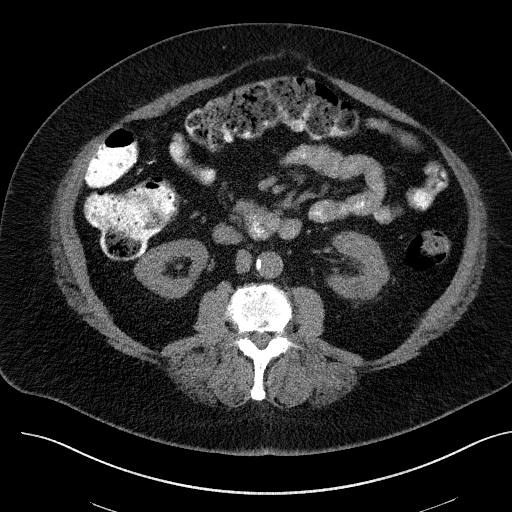
[im 56/124  lung]
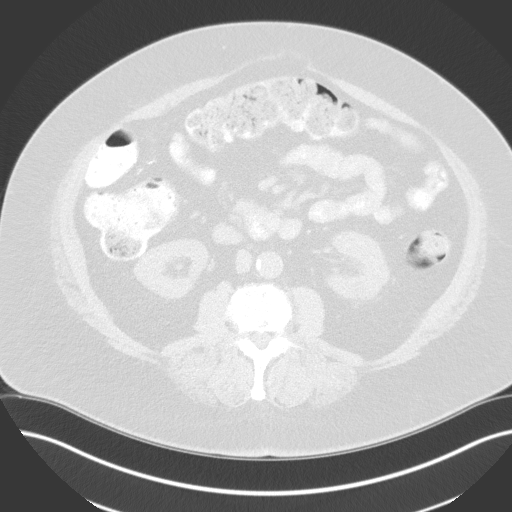
[im 61/124  lung]
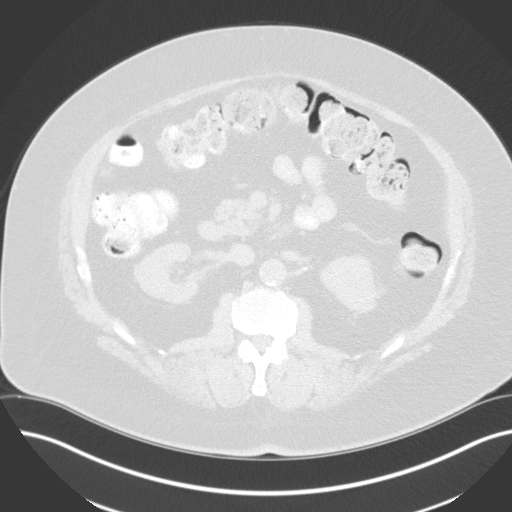
[im 62/124  lung]
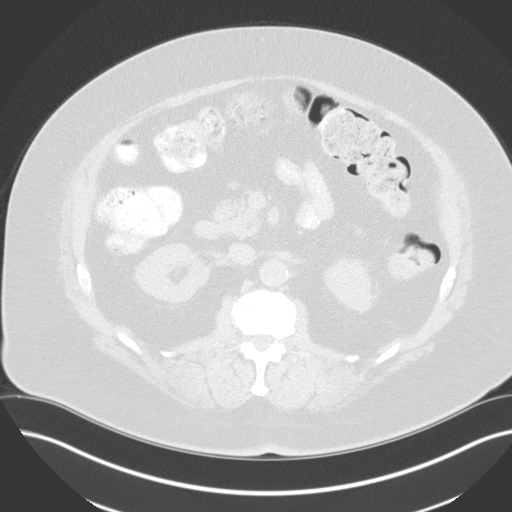
[im 68/124  lung]
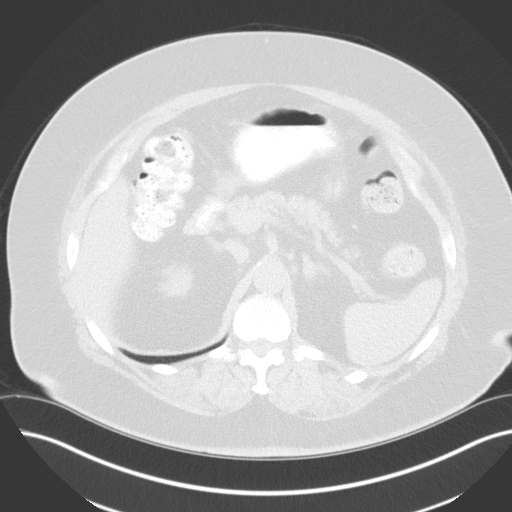
[im 79/124  mediastinal]
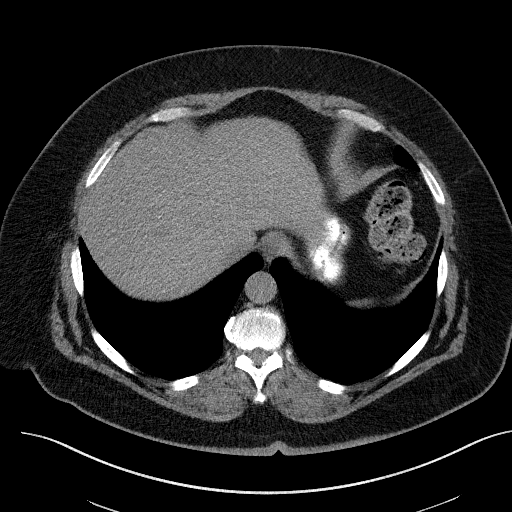
[im 79/124  lung]
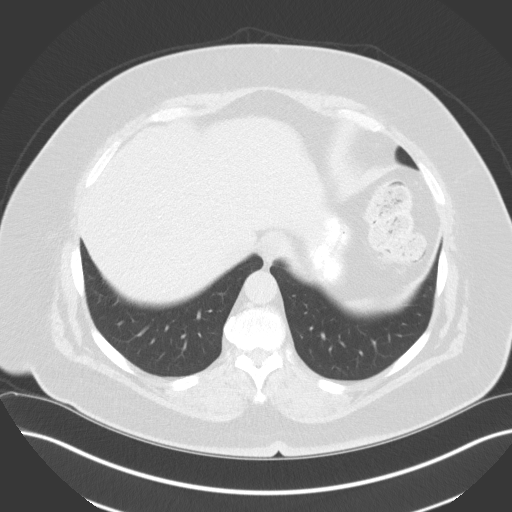
[im 90/124  lung]
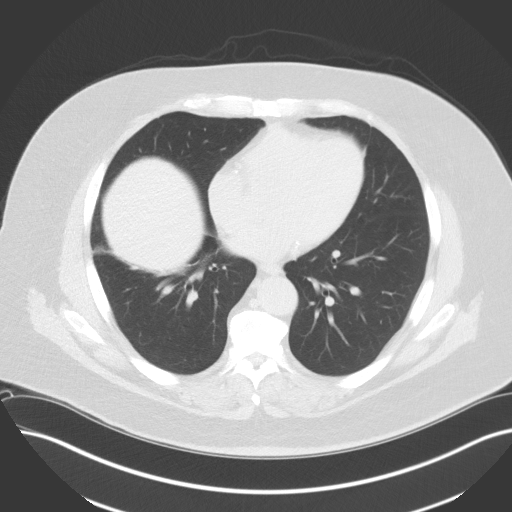
[im 101/124  lung]
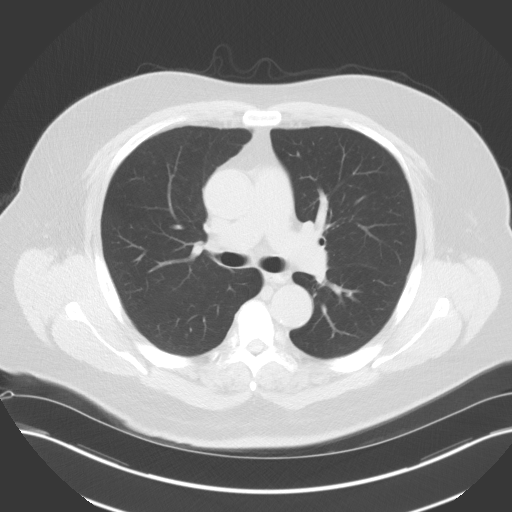
[im 112/124  lung]
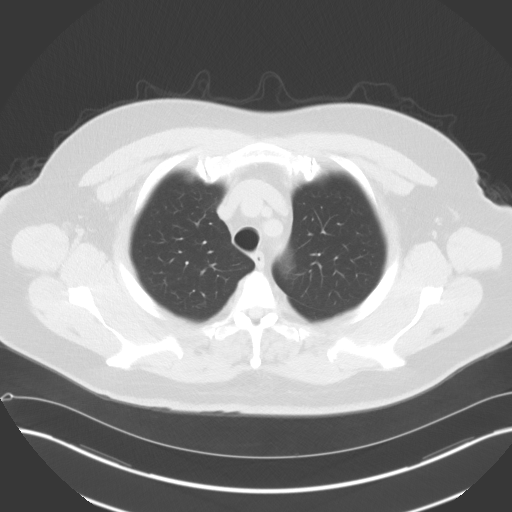

[12 of 32 positions shown; findings below may reference images not displayed]

FINDINGS: CT CHEST FINDINGS

Cardiovascular: No acute findings. Aortic and coronary artery
atherosclerosis.

Mediastinum/Lymph Nodes: No masses or pathologically enlarged lymph
nodes identified on this unenhanced exam.

Lungs/Pleura: No pulmonary infiltrate or mass identified. Two tiny
sub-cm left upper and lower lobe pulmonary nodules remains stable,
consistent with benign etiology. No effusion present.

Musculoskeletal:  No suspicious bone lesions identified.

CT ABDOMEN AND PELVIS FINDINGS

Hepatobiliary: No masses visualized on this unenhanced exam.
Gallbladder is unremarkable.

Pancreas: No mass or inflammatory changes identified on this
unenhanced exam.

Spleen: Within normal limits in size.

Adrenals/Urinary Tract: No evidence of urolithiasis or
hydronephrosis. Unremarkable appearance of bladder.

Stomach/Bowel: No evidence of obstruction, inflammatory process, or
abnormal fluid collections. Post treatment changes seen within the
presacral region, however no mass identified.

Vascular/Lymphatic: No pathologically enlarged lymph nodes
identified. No abdominal aortic aneurysm. Aortic atherosclerosis.

Reproductive:  No mass or other significant abnormality identified.

Other: Moderate right lower quadrant ventral hernia seen containing
several small bowel loops. Small periumbilical hernia seen
containing small bowel. No evidence of bowel obstruction or
ischemia.

Musculoskeletal:  No suspicious bone lesions identified.
IMPRESSION: Expected post treatment changes in presacral region. No evidence of
recurrent or metastatic carcinoma within the chest, abdomen, or
pelvis.

Right lower quadrant and periumbilical ventral hernias containing
small bowel. No evidence of bowel obstruction or ischemia.

Aortic and coronary artery atherosclerosis.

## 2018-07-03 ENCOUNTER — Ambulatory Visit (INDEPENDENT_AMBULATORY_CARE_PROVIDER_SITE_OTHER): Payer: 59 | Admitting: Vascular Surgery

## 2018-07-03 ENCOUNTER — Encounter (INDEPENDENT_AMBULATORY_CARE_PROVIDER_SITE_OTHER): Payer: 59

## 2018-07-31 ENCOUNTER — Ambulatory Visit (INDEPENDENT_AMBULATORY_CARE_PROVIDER_SITE_OTHER): Payer: 59 | Admitting: Vascular Surgery

## 2018-07-31 ENCOUNTER — Encounter (INDEPENDENT_AMBULATORY_CARE_PROVIDER_SITE_OTHER): Payer: 59

## 2018-10-02 ENCOUNTER — Encounter (INDEPENDENT_AMBULATORY_CARE_PROVIDER_SITE_OTHER): Payer: 59

## 2018-10-02 ENCOUNTER — Ambulatory Visit (INDEPENDENT_AMBULATORY_CARE_PROVIDER_SITE_OTHER): Payer: 59 | Admitting: Vascular Surgery

## 2018-11-09 ENCOUNTER — Other Ambulatory Visit (INDEPENDENT_AMBULATORY_CARE_PROVIDER_SITE_OTHER): Payer: Self-pay | Admitting: Vascular Surgery

## 2018-11-09 DIAGNOSIS — Z95820 Peripheral vascular angioplasty status with implants and grafts: Secondary | ICD-10-CM

## 2018-11-09 DIAGNOSIS — I70219 Atherosclerosis of native arteries of extremities with intermittent claudication, unspecified extremity: Secondary | ICD-10-CM

## 2018-11-13 ENCOUNTER — Encounter (INDEPENDENT_AMBULATORY_CARE_PROVIDER_SITE_OTHER): Payer: 59

## 2018-11-13 ENCOUNTER — Ambulatory Visit (INDEPENDENT_AMBULATORY_CARE_PROVIDER_SITE_OTHER): Payer: 59 | Admitting: Vascular Surgery

## 2018-11-13 ENCOUNTER — Encounter (INDEPENDENT_AMBULATORY_CARE_PROVIDER_SITE_OTHER): Payer: Self-pay

## 2018-11-16 ENCOUNTER — Ambulatory Visit: Payer: 59

## 2018-11-23 ENCOUNTER — Ambulatory Visit: Payer: 59 | Admitting: Hematology and Oncology

## 2018-11-23 ENCOUNTER — Other Ambulatory Visit: Payer: 59

## 2018-12-05 ENCOUNTER — Encounter (INDEPENDENT_AMBULATORY_CARE_PROVIDER_SITE_OTHER): Payer: Self-pay | Admitting: Vascular Surgery

## 2018-12-15 ENCOUNTER — Ambulatory Visit: Payer: 59

## 2018-12-19 ENCOUNTER — Other Ambulatory Visit: Payer: 59

## 2018-12-19 ENCOUNTER — Ambulatory Visit: Payer: 59 | Admitting: Hematology and Oncology

## 2018-12-29 ENCOUNTER — Other Ambulatory Visit: Payer: Self-pay

## 2018-12-29 ENCOUNTER — Ambulatory Visit
Admission: RE | Admit: 2018-12-29 | Discharge: 2018-12-29 | Disposition: A | Discharge status: Home / Self Care | Payer: 59 | Source: Ambulatory Visit | Attending: Hematology and Oncology | Admitting: Hematology and Oncology

## 2018-12-29 DIAGNOSIS — K469 Unspecified abdominal hernia without obstruction or gangrene: Secondary | ICD-10-CM | POA: Diagnosis not present

## 2018-12-29 DIAGNOSIS — C20 Malignant neoplasm of rectum: Secondary | ICD-10-CM | POA: Insufficient documentation

## 2018-12-29 DIAGNOSIS — I712 Thoracic aortic aneurysm, without rupture: Secondary | ICD-10-CM | POA: Diagnosis not present

## 2018-12-29 DIAGNOSIS — I700 Atherosclerosis of aorta: Secondary | ICD-10-CM | POA: Diagnosis not present

## 2018-12-29 DIAGNOSIS — N400 Enlarged prostate without lower urinary tract symptoms: Secondary | ICD-10-CM | POA: Diagnosis not present

## 2018-12-29 DIAGNOSIS — I2510 Atherosclerotic heart disease of native coronary artery without angina pectoris: Secondary | ICD-10-CM | POA: Diagnosis not present

## 2018-12-29 LAB — POCT I-STAT CREATININE: Creatinine, Ser: 1.2 mg/dL (ref 0.61–1.24)

## 2018-12-29 MED ORDER — IOHEXOL 300 MG/ML  SOLN
100.0000 mL | Freq: Once | INTRAMUSCULAR | Status: AC | PRN
  Administered 2018-12-29: 10:00:00 100 mL via INTRAVENOUS

## 2018-12-29 NOTE — Progress Notes (Signed)
Called patient no answer left message to back so we can get him screened and checked in for visit on 9/14

## 2019-01-01 ENCOUNTER — Encounter: Payer: Self-pay | Admitting: Hematology and Oncology

## 2019-01-01 ENCOUNTER — Other Ambulatory Visit: Payer: Self-pay | Admitting: Hematology and Oncology

## 2019-01-01 ENCOUNTER — Inpatient Hospital Stay (HOSPITAL_BASED_OUTPATIENT_CLINIC_OR_DEPARTMENT_OTHER): Payer: 59 | Admitting: Hematology and Oncology

## 2019-01-01 ENCOUNTER — Inpatient Hospital Stay: Payer: 59 | Attending: Hematology and Oncology

## 2019-01-01 ENCOUNTER — Other Ambulatory Visit: Payer: Self-pay

## 2019-01-01 VITALS — BP 158/78 | HR 57 | Temp 98.4°F | Resp 18 | Wt 211.5 lb

## 2019-01-01 DIAGNOSIS — Z833 Family history of diabetes mellitus: Secondary | ICD-10-CM | POA: Diagnosis not present

## 2019-01-01 DIAGNOSIS — Z87891 Personal history of nicotine dependence: Secondary | ICD-10-CM | POA: Insufficient documentation

## 2019-01-01 DIAGNOSIS — M109 Gout, unspecified: Secondary | ICD-10-CM | POA: Insufficient documentation

## 2019-01-01 DIAGNOSIS — Z79899 Other long term (current) drug therapy: Secondary | ICD-10-CM | POA: Insufficient documentation

## 2019-01-01 DIAGNOSIS — R972 Elevated prostate specific antigen [PSA]: Secondary | ICD-10-CM | POA: Diagnosis not present

## 2019-01-01 DIAGNOSIS — C20 Malignant neoplasm of rectum: Secondary | ICD-10-CM

## 2019-01-01 DIAGNOSIS — D509 Iron deficiency anemia, unspecified: Secondary | ICD-10-CM | POA: Diagnosis not present

## 2019-01-01 DIAGNOSIS — G620 Drug-induced polyneuropathy: Secondary | ICD-10-CM | POA: Insufficient documentation

## 2019-01-01 DIAGNOSIS — K219 Gastro-esophageal reflux disease without esophagitis: Secondary | ICD-10-CM | POA: Diagnosis not present

## 2019-01-01 DIAGNOSIS — E1142 Type 2 diabetes mellitus with diabetic polyneuropathy: Secondary | ICD-10-CM | POA: Insufficient documentation

## 2019-01-01 DIAGNOSIS — I129 Hypertensive chronic kidney disease with stage 1 through stage 4 chronic kidney disease, or unspecified chronic kidney disease: Secondary | ICD-10-CM | POA: Diagnosis not present

## 2019-01-01 DIAGNOSIS — E538 Deficiency of other specified B group vitamins: Secondary | ICD-10-CM

## 2019-01-01 DIAGNOSIS — Z8249 Family history of ischemic heart disease and other diseases of the circulatory system: Secondary | ICD-10-CM | POA: Diagnosis not present

## 2019-01-01 DIAGNOSIS — R634 Abnormal weight loss: Secondary | ICD-10-CM | POA: Diagnosis not present

## 2019-01-01 DIAGNOSIS — E1122 Type 2 diabetes mellitus with diabetic chronic kidney disease: Secondary | ICD-10-CM | POA: Insufficient documentation

## 2019-01-01 DIAGNOSIS — N189 Chronic kidney disease, unspecified: Secondary | ICD-10-CM | POA: Diagnosis not present

## 2019-01-01 DIAGNOSIS — E1151 Type 2 diabetes mellitus with diabetic peripheral angiopathy without gangrene: Secondary | ICD-10-CM | POA: Diagnosis not present

## 2019-01-01 LAB — COMPREHENSIVE METABOLIC PANEL
ALT: 31 U/L (ref 0–44)
AST: 28 U/L (ref 15–41)
Albumin: 3.9 g/dL (ref 3.5–5.0)
Alkaline Phosphatase: 103 U/L (ref 38–126)
Anion gap: 13 (ref 5–15)
BUN: 15 mg/dL (ref 8–23)
CO2: 24 mmol/L (ref 22–32)
Calcium: 9.1 mg/dL (ref 8.9–10.3)
Chloride: 101 mmol/L (ref 98–111)
Creatinine, Ser: 1.1 mg/dL (ref 0.61–1.24)
GFR calc Af Amer: >60 mL/min (ref >60)
GFR calc non Af Amer: >60 mL/min (ref >60)
Glucose, Bld: 260 mg/dL — ABNORMAL HIGH (ref 70–99)
Potassium: 3.7 mmol/L (ref 3.5–5.1)
Sodium: 138 mmol/L (ref 135–145)
Total Bilirubin: 0.6 mg/dL (ref 0.3–1.2)
Total Protein: 6.8 g/dL (ref 6.5–8.1)

## 2019-01-01 LAB — CBC WITH DIFFERENTIAL/PLATELET
Abs Immature Granulocytes: 0.02 10*3/uL (ref 0.00–0.07)
Basophils Absolute: 0 10*3/uL (ref 0.0–0.1)
Basophils Relative: 0 %
Eosinophils Absolute: 0.2 10*3/uL (ref 0.0–0.5)
Eosinophils Relative: 3 %
HCT: 41.8 % (ref 39.0–52.0)
Hemoglobin: 12.8 g/dL — ABNORMAL LOW (ref 13.0–17.0)
Immature Granulocytes: 0 %
Lymphocytes Relative: 20 %
Lymphs Abs: 1.1 10*3/uL (ref 0.7–4.0)
MCH: 25.4 pg — ABNORMAL LOW (ref 26.0–34.0)
MCHC: 30.6 g/dL (ref 30.0–36.0)
MCV: 83.1 fL (ref 80.0–100.0)
Monocytes Absolute: 0.3 10*3/uL (ref 0.1–1.0)
Monocytes Relative: 6 %
Neutro Abs: 3.8 10*3/uL (ref 1.7–7.7)
Neutrophils Relative %: 71 %
Platelets: 224 10*3/uL (ref 150–400)
RBC: 5.03 MIL/uL (ref 4.22–5.81)
RDW: 16 % — ABNORMAL HIGH (ref 11.5–15.5)
WBC: 5.4 10*3/uL (ref 4.0–10.5)
nRBC: 0 % (ref 0.0–0.2)

## 2019-01-01 LAB — FERRITIN: Ferritin: 146 ng/mL (ref 24–336)

## 2019-01-01 LAB — VITAMIN B12: Vitamin B-12: 961 pg/mL — ABNORMAL HIGH (ref 180–914)

## 2019-01-01 LAB — PSA: Prostatic Specific Antigen: 0.33 ng/mL (ref 0.00–4.00)

## 2019-01-01 LAB — FOLATE: Folate: 16.8 ng/mL (ref >5.9)

## 2019-01-01 NOTE — Progress Notes (Signed)
Patient here for follow up. Denies any concerns.  

## 2019-01-01 NOTE — Progress Notes (Signed)
[facility]  [address], [address] [place], [place] [address] Phone: [phone]  Fax: [phone]   Clinic Day:  01/01/2019  Referring physician: [facility]  Chief Complaint: BRYSE BILA [name] is a 63 y.o. male with clinical stage IIA rectal cancer who is seen for 7 month assessment  HPI: The patient was last seen in the medical oncology clinic on 05/26/2018. At that time, he denied any GI complaints.  Exam was unremarkable.  CEA was normal.  Chest, abdomen, and pelvis CT on 12/29/2018 revealed s/p rectal resection and reanastomosis.  There was no evidence of recurrent or metastatic disease in the chest, abdomen, or pelvis. There was coronary artery disease, mild aortic atherosclerosis with enlargement of the proximal descending thoracic aorta up to 3.4 x 3.4 cm, unchanged from prior examination, and severe mixed aortic atherosclerosis of the abdominal aorta, including focal ectasia of the right aspect of the infrarenal abdominal aorta, measuring up to the 2.8 x 2.4 cm and unchanged from prior. There was a fat and nonobstructed small bowel containing hernia of the right hemiabdomen at the site of a previous ostomy.  There was prostatomegaly.  During the interim, he has no complaints. He was diagnosed with COVID-19 approximately 2-3 weeks ago with symptoms of decreased taste and sense of smell.    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 His abdomin and bowel movements are normal.   At this time, no one is following his PSA. He has no know family history of prostate cancer.    Past Medical History:  Diagnosis Date  . Atherosclerosis   . Cancer ([place])   . Chronic kidney disease    kidney stones, UTI  . Colon cancer ([place])   . Coronary artery disease   . Diabetes mellitus without complication ([place])   . GERD (gastroesophageal  reflux disease)   . Gout   . Hematuria   . Hyperlipidemia   . Hypertension   . Iron deficiency anemia   . Myocardial infarction ([place]) 2005  . Peripheral vascular disease ([place])   . Pulmonary nodules   . Rectal cancer ([place])   . Ureter, stricture   . Wears dentures    full upper    Past Surgical History:  Procedure Laterality Date  . BOWEL RESECTION N/A 10/23/2014   Procedure: LOW ANTERIOR BOWEL RESECTION;  Surgeon: Sherri Rad, MD;  Location: [facility];  Service: General;  Laterality: N/A;  . COLON SURGERY    . COLONOSCOPY 06/06/14    . COLONOSCOPY WITH ESOPHAGOGASTRODUODENOSCOPY (EGD)    . CORONARY ANGIOPLASTY WITH STENT PLACEMENT  03/2004  . CYSTOSCOPY WITH STENT PLACEMENT Bilateral 10/23/2014   Procedure: CYSTOSCOPY WITH STENT PLACEMENT,URETHRAL DILATION, LEFT RETROGRADE PYELOGRAM, URETEROSCOPY;  Surgeon: Hollice Espy, MD;  Location: [facility];  Service: Urology;  Laterality: Bilateral;  . DIVERTING ILEOSTOMY N/A 10/23/2014   Procedure: DIVERTING ILEOSTOMY;  Surgeon: Sherri Rad, MD;  Location: [facility];  Service: General;  Laterality: N/A;  . ILEOSTOMY CLOSURE N/A 09/08/2015   Procedure: ILEOSTOMY TAKEDOWN;  Surgeon: Clayburn Pert, MD;  Location: [facility];  Service: General;  Laterality: N/A;  . LAPAROTOMY N/A 10/23/2014   Procedure: EXPLORATORY LAPAROTOMY;  Surgeon: Sherri Rad, MD;  Location: [facility];  Service: General;  Laterality: N/A;  . PERIPHERAL VASCULAR CATHETERIZATION N/A 05/12/2015   Procedure: Abdominal Aortogram w/Lower Extremity;  Surgeon: Algernon Huxley,  MD;  Location: [place] [facility];  Service: Cardiovascular;  Laterality: N/A;  . PERIPHERAL VASCULAR CATHETERIZATION  05/12/2015   Procedure: Lower Extremity Intervention;  Surgeon: Algernon Huxley, MD;  Location: [place] [facility];  Service: Cardiovascular;;  . PERIPHERAL VASCULAR CATHETERIZATION N/A 06/30/2015   Procedure: Abdominal Aortogram w/Lower Extremity;  Surgeon: Algernon Huxley, MD;  Location: [place] [facility];   Service: Cardiovascular;  Laterality: N/A;  . PERIPHERAL VASCULAR CATHETERIZATION  06/30/2015   Procedure: Lower Extremity Intervention;  Surgeon: Algernon Huxley, MD;  Location: [place] [facility];  Service: Cardiovascular;;  . PORT-A-CATH REMOVAL Right 09/08/2015   Procedure: REMOVAL PORT-A-CATH;  Surgeon: Clayburn Pert, MD;  Location: [facility];  Service: General;  Laterality: Right;  . PORTACATH PLACEMENT  07/01/2014   Dr. Marina Gravel    Family History  Problem Relation Age of Onset  . Hypertension Brother   . Hypertension Father   . Diabetes Father   . Diabetes Brother   . Hypertension Brother   . Heart disease Paternal Grandfather     Social History:  reports that he quit smoking about 34 years ago. His smoking use included cigarettes. He has never used smokeless tobacco. He reports that he does not drink alcohol or use drugs. He lives in [place].  He is working on disability. The patient is alone today.  Allergies:  Allergies  Allergen Reactions  . No Known Allergies     Current Medications: Current Outpatient Medications  Medication Sig Dispense Refill  . acetaminophen (TYLENOL) 500 MG tablet Take 500 mg by mouth every 6 (six) hours as needed.    . Ascorbic Acid (VITAMIN C) 1000 MG tablet Take 1,000 mg by mouth daily. Reported on 09/26/2015    . cloNIDine (CATAPRES) 0.2 MG tablet Take 0.2 mg by mouth daily.    . clopidogrel (PLAVIX) 75 MG tablet Take 75 mg by mouth daily.     . colchicine 0.6 MG tablet Take 0.6 mg by mouth daily.    . hydrochlorothiazide (MICROZIDE) 12.5 MG capsule Take 12.5 mg by mouth daily.      No current facility-administered medications for this visit.    Facility-Administered Medications Ordered in Other Visits  Medication Dose Route Frequency Provider Last Rate Last Dose  . heparin lock flush 100 unit/mL  500 Units Intravenous Once Corcoran, Melissa C, MD      . sodium chloride 0.9 % injection 10 mL  10 mL Intravenous PRN Dallas Schimke, MD   10 mL  at 08/26/14 1300  . sodium chloride flush (NS) 0.9 % injection 10 mL  10 mL Intravenous PRN Lequita Asal, MD        Review of Systems  Constitutional: Positive for weight loss (5 pounds). Negative for chills, diaphoresis, fever and malaise/fatigue.       Feels "pretty good".  HENT: Negative.  Negative for congestion, ear pain, nosebleeds, sinus pain and sore throat.   Eyes: Negative.  Negative for blurred vision, double vision and photophobia.  Respiratory: Negative.  Negative for cough, hemoptysis, sputum production and shortness of breath.   Cardiovascular: Negative for chest pain, palpitations, orthopnea, leg swelling and PND.       Peripheral vascular disease.  Gastrointestinal: Negative.  Negative for abdominal pain, blood in stool, constipation, diarrhea, melena, nausea and vomiting.  Genitourinary: Negative.  Negative for dysuria, frequency, hematuria and urgency.  Musculoskeletal: Negative.  Negative for back pain, falls, joint pain, myalgias and neck pain.  No interval gout.  Skin: Negative.  Negative for itching and rash.  Neurological: Negative.  Negative for dizziness, tremors, speech change, focal weakness, seizures, weakness and headaches.  Endo/Heme/Allergies: Negative.  Does not bruise/bleed easily.  Psychiatric/Behavioral: Negative.  Negative for depression and memory loss. The patient is not nervous/anxious and does not have insomnia.   All other systems reviewed and are negative.  Performance status (ECOG): 1  Vitals Blood pressure (!) 158/78, pulse (!) 57, temperature 98.4 F (36.9 C), temperature source Oral, resp. rate 18, weight 211 lb 8.5 oz (95.9 kg), SpO2 100 %.   Physical Exam  Constitutional: He is oriented to person, place, and time. He appears well-developed and well-nourished. No distress. Face mask in place.  HENT:  Head: Normocephalic and atraumatic.  Right Ear: Hearing normal.  Left Ear: Hearing normal.  Nose: Nose normal.   Mouth/Throat: Oropharynx is clear and moist and mucous membranes are normal. No oral lesions. No oropharyngeal exudate.  Dark hair.  Mask.  Eyes: Pupils are equal, round, and reactive to light. Conjunctivae and EOM are normal. No scleral icterus.  Neck: Neck supple. No JVD present.  Cardiovascular: Normal rate, regular rhythm and normal heart sounds. Exam reveals no gallop and no friction rub.  No murmur heard. Pulmonary/Chest: Effort normal and breath sounds normal. He has no wheezes. He has no rhonchi. He has no rales. He exhibits no mass, no tenderness and no edema. Right breast exhibits no mass and no tenderness. Left breast exhibits no mass and no tenderness.  Abdominal: Soft. Normal appearance and bowel sounds are normal. He exhibits no mass. There is no hepatosplenomegaly. There is no abdominal tenderness. There is no rebound, no guarding and no CVA tenderness.  Musculoskeletal: Normal range of motion.        General: No edema.  Lymphadenopathy:    He has no cervical adenopathy.       Right cervical: No superficial cervical adenopathy present.   He has no axillary adenopathy.       Right: No inguinal adenopathy present.       Left: No inguinal adenopathy present.  Neurological: He is alert and oriented to person, place, and time.  Skin: Skin is warm, dry and intact. No bruising, no lesion and no rash noted. He is not diaphoretic. No erythema. No pallor.  Psychiatric: He has a normal mood and affect. His behavior is normal. Judgment and thought content normal.  Nursing note and vitals reviewed.    Appointment on 01/01/2019  Component Date Value Ref Range Status  . Folate 01/01/2019 16.8  >5.9 ng/mL Final   Performed at [facility], [address] [place], [place] [address]  . Vitamin B-12 01/01/2019 961* 180 - 914 pg/mL Final   Comment: (NOTE) This assay is not validated for testing neonatal or myeloproliferative syndrome specimens for Vitamin B12 levels. Performed  at [facility], [place] [address] [place], [place] [address]   . Ferritin 01/01/2019 146  24 - 336 ng/mL Final   Performed at [facility], [address] [place], [place] [address]  . CEA 01/01/2019 3.3  0.0 - 4.7 ng/mL Final   Comment: (NOTE)                             Nonsmokers          <3.9  Smokers             <5.6 Roche [employer] Electrochemiluminescence Immunoassay (ECLIA) Values obtained with different assay methods or kits cannot be used interchangeably.  Results cannot be interpreted as absolute evidence of the presence or absence of malignant disease. Performed At: [facility] [place], [place] [id number] Rush Farmer MD [id number]   . Sodium 01/01/2019 138  135 - 145 mmol/L Final  . Potassium 01/01/2019 3.7  3.5 - 5.1 mmol/L Final  . Chloride 01/01/2019 101  98 - 111 mmol/L Final  . CO2 01/01/2019 24  22 - 32 mmol/L Final  . Glucose, Bld 01/01/2019 260* 70 - 99 mg/dL Final  . BUN 01/01/2019 15  8 - 23 mg/dL Final  . Creatinine, Ser 01/01/2019 1.10  0.61 - 1.24 mg/dL Final  . Calcium 01/01/2019 9.1  8.9 - 10.3 mg/dL Final  . Total Protein 01/01/2019 6.8  6.5 - 8.1 g/dL Final  . Albumin 01/01/2019 3.9  3.5 - 5.0 g/dL Final  . AST 01/01/2019 28  15 - 41 U/L Final  . ALT 01/01/2019 31  0 - 44 U/L Final  . Alkaline Phosphatase 01/01/2019 103  38 - 126 U/L Final  . Total Bilirubin 01/01/2019 0.6  0.3 - 1.2 mg/dL Final  . GFR calc non Af Amer 01/01/2019 >60  >60 mL/min Final  . GFR calc Af Amer 01/01/2019 >60  >60 mL/min Final  . Anion gap 01/01/2019 13  5 - 15 Final   Performed at [facility], [address] [place], [place] [address]  . WBC 01/01/2019 5.4  4.0 - 10.5 K/uL Final  . RBC 01/01/2019 5.03  4.22 - 5.81 MIL/uL Final  . Hemoglobin 01/01/2019 12.8* 13.0 - 17.0 g/dL Final  . HCT 01/01/2019 41.8  39.0 - 52.0 % Final  . MCV 01/01/2019 83.1  80.0 - 100.0 fL Final  . MCH  01/01/2019 25.4* 26.0 - 34.0 pg Final  . MCHC 01/01/2019 30.6  30.0 - 36.0 g/dL Final  . RDW 01/01/2019 16.0* 11.5 - 15.5 % Final  . Platelets 01/01/2019 224  150 - 400 K/uL Final  . nRBC 01/01/2019 0.0  0.0 - 0.2 % Final  . Neutrophils Relative % 01/01/2019 71  % Final  . Neutro Abs 01/01/2019 3.8  1.7 - 7.7 K/uL Final  . Lymphocytes Relative 01/01/2019 20  % Final  . Lymphs Abs 01/01/2019 1.1  0.7 - 4.0 K/uL Final  . Monocytes Relative 01/01/2019 6  % Final  . Monocytes Absolute 01/01/2019 0.3  0.1 - 1.0 K/uL Final  . Eosinophils Relative 01/01/2019 3  % Final  . Eosinophils Absolute 01/01/2019 0.2  0.0 - 0.5 K/uL Final  . Basophils Relative 01/01/2019 0  % Final  . Basophils Absolute 01/01/2019 0.0  0.0 - 0.1 K/uL Final  . Immature Granulocytes 01/01/2019 0  % Final  . Abs Immature Granulocytes 01/01/2019 0.02  0.00 - 0.07 K/uL Final   Performed at [facility], [address], [place], [place] [address]  . Prostatic Specific Antigen 01/01/2019 0.33  0.00 - 4.00 ng/mL Final   Comment: (NOTE) While PSA levels of <=4.0 ng/ml are reported as reference range, some men with levels below 4.0 ng/ml can have prostate cancer and many men with PSA above 4.0 ng/ml do not have prostate cancer.  Other tests such as free PSA, age specific reference ranges, PSA velocity and PSA doubling time may be helpful especially in men less than  48 years old. Performed at [facility], [place] [address], [place], [place] [address]     Assessment:  [name] COCHREN [name] is a 63 y.o. male with clinical stage IIA (T3N0) rectal cancer.  Colonoscopy on 06/06/2014 revealed a large non-circumferential distal rectal mass. Biopsy was positive for adenocarcinoma.   CT scans revealed a 3 mm pulmonary nodule (indeterminate). There was a large rectal mass suspicious for transmural extension into the mesorectum.There was equivocal perirectal and sigmoid mesocolon nodes. There was no liver metastasis or  extrapelvic disease.  Endoscopic ultrasound at [facility] revealed a 70% circumferential mass at 9-14 cm from the anal verge and measuring 9 mm in maximal thickness. Ultrasound suggested breakthrough of the muscularis propria with invasion into the perirectal fat.  He received neoadjuvant chemotherapy (continuous infusion 5FU) and radiation. He received radiation from 07/03/2014 until 08/16/2014. He underwent low anterior resection with diverting loop ileostomy on 10/23/2014.  Pathology revealed a complete response.  Zero of 10 lymph nodes were positive.  He underwent ileostomy take-down on 09/08/2015.  He has iron deficiency anemia.  Ferritin was 34 on 10/04/2014, 134 on 08/31/2016, 218 on 01/03/2017, 255 on 02/02/2017, 98 on 05/17/2017, and 112 on 11/21/2017.  He is on oral iron (1 tablet/day).    He received 8 cycles of FOLFOX chemotherapy (12/27/2014 - 04/15/2015).  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    CEA has been followed:  2.1 on 08/26/2014, 3.6 on 04/15/2015, 2.5 on 06/23/2015, 3.2 on 09/26/2015, 2.8 on 12/29/2015, 2.7 on 04/22/2016, 3.6 on 08/31/2016, 2.8 on 01/03/2017, 2.8 on 05/17/2017, 4.1 on 11/21/2017, 2.6 on 05/26/2018, and 3.3 on 01/01/2019.  Chest, abdomen, and pelvic CT on 08/25/2016 revealed expected post treatment changes in presacral region.  There was no evidence of recurrent or metastatic carcinoma within the chest, abdomen, or pelvis.   Chest, abdomen, and pelvic CT on 11/15/2017 demonstrated stable post-treatment changes in the pre-sacral region/pelvis. No evidence of recurrent or metastatic disease. Stable 3 mm LLL and 4 mm lingular nodules observed. There were anterior abdominal wall hernias without evidence of obstruction.   He has significant peripheral vascular disease. He underwent angioplasty of the left mid to distal posterior tibial artery, above-knee popliteal artery and entire SFA on 05/12/2015.  He underwent stent 2  to the left SFA  and popliteal artery for multiple areas of greater than 50% residual stenosis after angioplasty.    He has a history of increased liver function tests.  He denies any new medications.  He does not drink alcohol.  Hepatitis B surface antigen, hepatitis B core antibody total, and hepatitis C antibody were negative on 08/31/2016.  He has a mild microcytic anemia.  Ferritin was 255 with a sed rate of 58 on 02/02/2017.  Ferritin was 98 on 05/17/2017, 112 on 11/21/2017, and 96 on 05/26/2018.  He has B12 deficiency.  B12 was 180 on 12/27/2017.  He is on oral B12.  B12 was 772 on 01/30/2018.  Folate was 24 on 12/27/2017.  Chest, abdomen, and pelvis CT on 12/29/2018 revealed s/p rectal resection and reanastomosis.  There was no evidence of recurrent or metastatic disease in the chest, abdomen, or pelvis. There was coronary artery disease, mild aortic atherosclerosis with enlargement of the proximal descending thoracic aorta up to 3.4 x 3.4 cm, unchanged from prior examination, and severe mixed aortic atherosclerosis of the abdominal aorta, including focal ectasia of the right aspect of the infrarenal abdominal aorta, measuring up to the 2.8 x  2.4 cm and unchanged from prior. There was a fat and nonobstructed small bowel containing hernia of the right hemiabdomen at the site of a previous ostomy.  There was prostatomegaly.  He had COVID-19 2-3 weeks ago.  Symptomatically, he is doing well.  He denies any GI symptoms.    Plan: 1.   Labs today:  CBC with diff, CMP, ferritin, CEA. 2.   Rectal cancer Clinically, he continues to do well.   He denies any GI symptoms.   Exam is unremarkable.   Review interval CT scans.  Images personally reviewed.  No evidence of recurrent disease. Discuss need for colonoscopy. Review plan for follow-up colonoscopy. 3.   Renal insufficiency BUN 15.  Creatinine 1.10 (improved).  Increase in creatinine in past felt secondary to IV contrast dye.  4.    Microcytic anemia Hematocrit 41.8.  Hemoglobin 12.8.  MVC 83.1. Ferritin 146.  B12 961.  Folate 16.8. Patient off oral iron. Continue iron rich food.  5.   B12 deficiency             B12 was 961 today.  He is on oral B12.  Monitor B12 and folate periodically. 6.   Peripheral vascular disease  Review interval imaging studies.  Per patient, claudication is stable.  Discuss follow-up with vascular surgery. 7.   RTC in 6 months for MD assessment and labs (CBC with diff, CMP, CEA).  I discussed the assessment and treatment plan with the patient.  The patient was provided an opportunity to ask questions and all were answered.  The patient agreed with the plan and demonstrated an understanding of the instructions.  The patient was advised to call back if the symptoms worsen or if the condition fails to improve as anticipated.    Lequita Asal, MD, PhD    01/01/2019, 3:14 PM  I, Jacqualyn Posey, am acting as Education administrator for [employer]. Mike Gip, MD, PhD.  I, Melissa C. Mike Gip, MD, have reviewed the above documentation for accuracy and completeness, and I agree with the above.

## 2019-01-02 LAB — CEA: CEA: 3.3 ng/mL (ref 0.0–4.7)

## 2019-01-18 DIAGNOSIS — I10 Essential (primary) hypertension: Secondary | ICD-10-CM | POA: Diagnosis not present

## 2019-01-18 DIAGNOSIS — Z125 Encounter for screening for malignant neoplasm of prostate: Secondary | ICD-10-CM | POA: Diagnosis not present

## 2019-01-18 DIAGNOSIS — D649 Anemia, unspecified: Secondary | ICD-10-CM | POA: Diagnosis not present

## 2019-01-18 DIAGNOSIS — E1165 Type 2 diabetes mellitus with hyperglycemia: Secondary | ICD-10-CM | POA: Diagnosis not present

## 2019-01-18 DIAGNOSIS — N182 Chronic kidney disease, stage 2 (mild): Secondary | ICD-10-CM | POA: Diagnosis not present

## 2019-01-18 DIAGNOSIS — E785 Hyperlipidemia, unspecified: Secondary | ICD-10-CM | POA: Diagnosis not present

## 2019-07-02 ENCOUNTER — Inpatient Hospital Stay: Payer: Medicare HMO | Admitting: Hematology and Oncology

## 2019-07-02 ENCOUNTER — Inpatient Hospital Stay: Payer: Medicare HMO

## 2019-07-10 ENCOUNTER — Other Ambulatory Visit: Payer: Self-pay

## 2019-07-10 ENCOUNTER — Other Ambulatory Visit: Payer: Self-pay | Admitting: Hematology and Oncology

## 2019-07-10 ENCOUNTER — Inpatient Hospital Stay: Payer: Medicare HMO | Attending: Hematology and Oncology

## 2019-07-10 ENCOUNTER — Inpatient Hospital Stay: Payer: Medicare HMO | Admitting: Hematology and Oncology

## 2019-07-10 DIAGNOSIS — I739 Peripheral vascular disease, unspecified: Secondary | ICD-10-CM | POA: Insufficient documentation

## 2019-07-10 DIAGNOSIS — C20 Malignant neoplasm of rectum: Secondary | ICD-10-CM

## 2019-07-10 DIAGNOSIS — Z8616 Personal history of COVID-19: Secondary | ICD-10-CM | POA: Insufficient documentation

## 2019-07-10 DIAGNOSIS — D509 Iron deficiency anemia, unspecified: Secondary | ICD-10-CM | POA: Diagnosis not present

## 2019-07-10 DIAGNOSIS — E538 Deficiency of other specified B group vitamins: Secondary | ICD-10-CM | POA: Diagnosis not present

## 2019-07-10 DIAGNOSIS — N289 Disorder of kidney and ureter, unspecified: Secondary | ICD-10-CM | POA: Diagnosis not present

## 2019-07-10 DIAGNOSIS — Z85038 Personal history of other malignant neoplasm of large intestine: Secondary | ICD-10-CM | POA: Insufficient documentation

## 2019-07-10 DIAGNOSIS — Z9221 Personal history of antineoplastic chemotherapy: Secondary | ICD-10-CM | POA: Insufficient documentation

## 2019-07-10 DIAGNOSIS — Z923 Personal history of irradiation: Secondary | ICD-10-CM | POA: Insufficient documentation

## 2019-07-10 DIAGNOSIS — R718 Other abnormality of red blood cells: Secondary | ICD-10-CM

## 2019-07-10 DIAGNOSIS — Z79899 Other long term (current) drug therapy: Secondary | ICD-10-CM | POA: Insufficient documentation

## 2019-07-10 DIAGNOSIS — Z87891 Personal history of nicotine dependence: Secondary | ICD-10-CM | POA: Diagnosis not present

## 2019-07-10 LAB — CBC WITH DIFFERENTIAL/PLATELET
Abs Immature Granulocytes: 0.01 10*3/uL (ref 0.00–0.07)
Basophils Absolute: 0 10*3/uL (ref 0.0–0.1)
Basophils Relative: 1 %
Eosinophils Absolute: 0.2 10*3/uL (ref 0.0–0.5)
Eosinophils Relative: 4 %
HCT: 44.2 % (ref 39.0–52.0)
Hemoglobin: 13.8 g/dL (ref 13.0–17.0)
Immature Granulocytes: 0 %
Lymphocytes Relative: 27 %
Lymphs Abs: 1.3 10*3/uL (ref 0.7–4.0)
MCH: 24.8 pg — ABNORMAL LOW (ref 26.0–34.0)
MCHC: 31.2 g/dL (ref 30.0–36.0)
MCV: 79.5 fL — ABNORMAL LOW (ref 80.0–100.0)
Monocytes Absolute: 0.4 10*3/uL (ref 0.1–1.0)
Monocytes Relative: 8 %
Neutro Abs: 3 10*3/uL (ref 1.7–7.7)
Neutrophils Relative %: 60 %
Platelets: 218 10*3/uL (ref 150–400)
RBC: 5.56 MIL/uL (ref 4.22–5.81)
RDW: 15.4 % (ref 11.5–15.5)
WBC: 5 10*3/uL (ref 4.0–10.5)
nRBC: 0 % (ref 0.0–0.2)

## 2019-07-10 LAB — FERRITIN: Ferritin: 127 ng/mL (ref 24–336)

## 2019-07-10 LAB — IRON AND TIBC
Iron: 57 ug/dL (ref 45–182)
Saturation Ratios: 20 % (ref 17.9–39.5)
TIBC: 284 ug/dL (ref 250–450)
UIBC: 227 ug/dL

## 2019-07-10 LAB — COMPREHENSIVE METABOLIC PANEL
ALT: 27 U/L (ref 0–44)
AST: 24 U/L (ref 15–41)
Albumin: 4.2 g/dL (ref 3.5–5.0)
Alkaline Phosphatase: 92 U/L (ref 38–126)
Anion gap: 11 (ref 5–15)
BUN: 20 mg/dL (ref 8–23)
CO2: 23 mmol/L (ref 22–32)
Calcium: 9.1 mg/dL (ref 8.9–10.3)
Chloride: 102 mmol/L (ref 98–111)
Creatinine, Ser: 1.18 mg/dL (ref 0.61–1.24)
GFR calc Af Amer: >60 mL/min (ref >60)
GFR calc non Af Amer: >60 mL/min (ref >60)
Glucose, Bld: 121 mg/dL — ABNORMAL HIGH (ref 70–99)
Potassium: 4 mmol/L (ref 3.5–5.1)
Sodium: 136 mmol/L (ref 135–145)
Total Bilirubin: 0.7 mg/dL (ref 0.3–1.2)
Total Protein: 7.2 g/dL (ref 6.5–8.1)

## 2019-07-11 ENCOUNTER — Ambulatory Visit: Payer: Medicare HMO | Admitting: Hematology and Oncology

## 2019-07-11 LAB — CEA: CEA: 2.9 ng/mL (ref 0.0–4.7)

## 2019-07-12 NOTE — Progress Notes (Signed)
[facility]  [address] [place], [place] [address] Phone: [phone]  Fax: [phone]   Clinic Day:  07/17/2019  Referring physician: No ref. provider found  Chief Complaint: [name] EBERLEIN [name] is a 64 y.o. male with clinical stage IIA rectal cancer who is seen for a 6 month assessment.  HPI: The patient was last seen in the medical oncology clinic on 01/01/2019. At that time, he was doing well. He denied any GI symptoms. We discussed the need for a follow up colonoscopy. We discussed a follow up with vascular surgery. Patient was off oral iron and continued to eat iron rich foods. He continued his oral B12.   Labs revealed a hematocrit 41.8, hemoglobin 12.8, platelets 224,000, WBC 5,400. Ferritin was 146. CMP was normal. Vitamin B12 was 961 with folate 16.8. CEA was 3.3. PSA was 0.33.   Labs on 07/10/2019 showed hematocrit 44.2, hemoglobin 13.8, MCV 79.5, platelets 218,000, WBC 5,000.  Ferritin was 127 with iron saturation of 20% and a TIBC of 284. CMP was normal. CEA 2.9.   During the interim, he has been doing ok. His mood is ok. Patient reports that he did not have a colonoscopy yet.  He denies any nausea or vomiting. He has no bloody or black stool. He is eating well. He has right knee pain and swelling after walking for a long time. He notes bilateral hand swelling. He denies any shortness of breath. Patient remains on oral B12.  Patient BP is 157/88 secondary to taking his medication right before visit.    Past Medical History:  Diagnosis Date  . Atherosclerosis   . Cancer ([place])   . Chronic kidney disease    kidney stones, UTI  . Colon cancer ([place])   . Coronary artery disease   . Diabetes mellitus without complication ([place])   . GERD (gastroesophageal reflux disease)   . Gout   . Hematuria   . Hyperlipidemia   . Hypertension   . Iron deficiency anemia   . Myocardial infarction ([place]) 2005  . Peripheral vascular disease ([place])     . Pulmonary nodules   . Rectal cancer ([place])   . Ureter, stricture   . Wears dentures    full upper    Past Surgical History:  Procedure Laterality Date  . BOWEL RESECTION N/A 10/23/2014   Procedure: LOW ANTERIOR BOWEL RESECTION;  Surgeon: Sherri Rad, MD;  Location: [facility] ORS;  Service: General;  Laterality: N/A;  . COLON SURGERY    . COLONOSCOPY 06/06/14    . COLONOSCOPY WITH ESOPHAGOGASTRODUODENOSCOPY (EGD)    . CORONARY ANGIOPLASTY WITH STENT PLACEMENT  03/2004  . CYSTOSCOPY WITH STENT PLACEMENT Bilateral 10/23/2014   Procedure: CYSTOSCOPY WITH STENT PLACEMENT,URETHRAL DILATION, LEFT RETROGRADE PYELOGRAM, URETEROSCOPY;  Surgeon: Hollice Espy, MD;  Location: [facility] ORS;  Service: Urology;  Laterality: Bilateral;  . DIVERTING ILEOSTOMY N/A 10/23/2014   Procedure: DIVERTING ILEOSTOMY;  Surgeon: Sherri Rad, MD;  Location: [facility] OR[facility];  Service: General;  Laterality: N/A;  . ILEOSTOMY CLOSURE N/A 09/08/2015   Procedure: ILEOSTOMY TAKEDOWN;  Surgeon: Clayburn Pert, MD;  Location: [facility] OR[facility];  Service: General;  Laterality: N/A;  . LAPAROTOMY N/A 10/23/2014   Procedure: EXPLORATORY LAPAROTOMY;  Surgeon: Sherri Rad, MD;  Location: [facility] OR[facility];  Service: General;  Laterality: N/A;  . PERIPHERAL VASCULAR CATHETERIZATION N/A 05/12/2015   Procedure: Abdominal Aortogram w/Lower Extremity;  Surgeon: Algernon Huxley, MD;  Location: [place] [facility];  Service: Cardiovascular;  Laterality: N/A;  .  PERIPHERAL VASCULAR CATHETERIZATION  05/12/2015   Procedure: Lower Extremity Intervention;  Surgeon: Algernon Huxley, MD;  Location: [place] [facility];  Service: Cardiovascular;;  . PERIPHERAL VASCULAR CATHETERIZATION N/A 06/30/2015   Procedure: Abdominal Aortogram w/Lower Extremity;  Surgeon: Algernon Huxley, MD;  Location: [place] [facility];  Service: Cardiovascular;  Laterality: N/A;  . PERIPHERAL VASCULAR CATHETERIZATION  06/30/2015   Procedure: Lower Extremity Intervention;  Surgeon: Algernon Huxley, MD;  Location: [place]  [facility];  Service: Cardiovascular;;  . PORT-A-CATH REMOVAL Right 09/08/2015   Procedure: REMOVAL PORT-A-CATH;  Surgeon: Clayburn Pert, MD;  Location: [facility];  Service: General;  Laterality: Right;  . PORTACATH PLACEMENT  07/01/2014   Dr. Marina Gravel    Family History  Problem Relation Age of Onset  . Hypertension Brother   . Hypertension Father   . Diabetes Father   . Diabetes Brother   . Hypertension Brother   . Heart disease Paternal Grandfather     Social History:  reports that he quit smoking about 34 years ago. His smoking use included cigarettes. He has never used smokeless tobacco. He reports that he does not drink alcohol or use drugs.  He lives in [place]. He is working on disability. The patient is alone today.  Allergies:  Allergies  Allergen Reactions  . No Known Allergies     Current Medications: Current Outpatient Medications  Medication Sig Dispense Refill  . acetaminophen (TYLENOL) 500 MG tablet Take 500 mg by mouth every 6 (six) hours as needed.    . Ascorbic Acid (VITAMIN C) 1000 MG tablet Take 1,000 mg by mouth daily. Reported on 09/26/2015    . cloNIDine (CATAPRES) 0.2 MG tablet Take 0.2 mg by mouth daily.    . clopidogrel (PLAVIX) 75 MG tablet Take 75 mg by mouth daily.     . colchicine 0.6 MG tablet Take 0.6 mg by mouth daily.    . hydrochlorothiazide (MICROZIDE) 12.5 MG capsule Take 12.5 mg by mouth daily.      No current facility-administered medications for this visit.   Facility-Administered Medications Ordered in Other Visits  Medication Dose Route Frequency Provider Last Rate Last Admin  . heparin lock flush 100 unit/mL  500 Units Intravenous Once Makaleigh Reinard C, MD      . sodium chloride 0.9 % injection 10 mL  10 mL Intravenous PRN Dallas Schimke, MD   10 mL at 08/26/14 1300  . sodium chloride flush (NS) 0.9 % injection 10 mL  10 mL Intravenous PRN Lequita Asal, MD        Review of Systems  Constitutional: Positive for weight loss (4  lbs). Negative for chills, diaphoresis, fever and malaise/fatigue.       Doing ok.  HENT: Negative.  Negative for congestion, ear pain, nosebleeds, sinus pain and sore throat.   Eyes: Negative.  Negative for blurred vision, double vision and photophobia.  Respiratory: Negative.  Negative for cough, hemoptysis, sputum production and shortness of breath.   Cardiovascular: Positive for leg swelling (right knee; bilateral hands). Negative for chest pain, palpitations, orthopnea and PND.       Peripheral vascular disease.  Gastrointestinal: Negative.  Negative for abdominal pain, blood in stool, constipation, diarrhea, melena, nausea and vomiting.       Eating well.  Genitourinary: Negative.  Negative for dysuria, frequency, hematuria and urgency.  Musculoskeletal: Positive for joint pain (right knee). Negative for back pain, falls, myalgias and neck pain.  No interval gout.  Skin: Negative.  Negative for itching and rash.  Neurological: Negative.  Negative for dizziness, tremors, speech change, focal weakness, seizures, weakness and headaches.  Endo/Heme/Allergies: Negative.  Does not bruise/bleed easily.  Psychiatric/Behavioral: Negative.  Negative for depression and memory loss. The patient is not nervous/anxious and does not have insomnia.        Mood is ok.  All other systems reviewed and are negative.  Performance status (ECOG): 1  Vitals Blood pressure (!) 157/88, pulse (!) 55, temperature 97.7 F (36.5 C), temperature source Tympanic, resp. rate 18, height 5\' 6"  (1.676 m), weight 207 lb 11.2 oz (94.2 kg), SpO2 100 %.   Physical Exam Vitals and nursing note reviewed.  Constitutional:      General: He is not in acute distress.    Appearance: Normal appearance. He is well-developed and well-nourished. He is not diaphoretic.  HENT:     Head: Normocephalic and atraumatic.     Right Ear: Hearing normal.     Left Ear: Hearing normal.     Nose: Nose normal.     Mouth/Throat:      Mouth: Oropharynx is clear and moist and mucous membranes are normal. No oral lesions.     Pharynx: No oropharyngeal exudate.      Comments: Dark hair. Blue cap. Mask. Eyes:     General: No scleral icterus.    Extraocular Movements: EOM normal.     Conjunctiva/sclera: Conjunctivae normal.     Pupils: Pupils are equal, round, and reactive to light.  Neck:     Vascular: No JVD.  Cardiovascular:     Rate and Rhythm: Normal rate and regular rhythm.     Heart sounds: Normal heart sounds. No murmur heard.  No friction rub. No gallop.   Pulmonary:     Effort: Pulmonary effort is normal. No respiratory distress.     Breath sounds: Normal breath sounds. No wheezing, rhonchi or rales.  Chest:     Chest wall: No mass, tenderness or edema.     Breasts:        Right: No mass or tenderness.        Left: No mass or tenderness.  Abdominal:     General: Bowel sounds are normal.     Palpations: Abdomen is soft. There is no hepatosplenomegaly or mass.     Tenderness: There is no abdominal tenderness. There is no CVA tenderness, guarding or rebound.  Musculoskeletal:        General: No tenderness. Edema: left lateral ankle 1+ Normal range of motion.     Cervical back: Normal range of motion and neck supple.  Lymphadenopathy:     Head:     Right side of head: No preauricular, posterior auricular or occipital adenopathy.     Left side of head: No preauricular, posterior auricular or occipital adenopathy.     Cervical: No cervical adenopathy.     Upper Body:  No axillary adenopathy present.    Right upper body: No supraclavicular adenopathy.     Left upper body: No supraclavicular adenopathy.     Lower Body: No right inguinal adenopathy. No left inguinal adenopathy.  Skin:    General: Skin is warm, dry and intact.     Coloration: Skin is not pale.     Findings: No bruising, erythema, lesion or rash.  Neurological:     Mental Status: He is alert and oriented to person, place, and time.    Psychiatric:  Mood and Affect: Mood and affect normal.        Behavior: Behavior normal.        Thought Content: Thought content normal.        Judgment: Judgment normal.    No visits with results within 3 Day(s) from this visit.  Latest known visit with results is:  Appointment on 07/10/2019  Component Date Value Ref Range Status  . Sodium 07/10/2019 136  135 - 145 mmol/L Final  . Potassium 07/10/2019 4.0  3.5 - 5.1 mmol/L Final  . Chloride 07/10/2019 102  98 - 111 mmol/L Final  . CO2 07/10/2019 23  22 - 32 mmol/L Final  . Glucose, Bld 07/10/2019 121* 70 - 99 mg/dL Final   Glucose reference range applies only to samples taken after fasting for at least 8 hours.  . BUN 07/10/2019 20  8 - 23 mg/dL Final  . Creatinine, Ser 07/10/2019 1.18  0.61 - 1.24 mg/dL Final  . Calcium 07/10/2019 9.1  8.9 - 10.3 mg/dL Final  . Total Protein 07/10/2019 7.2  6.5 - 8.1 g/dL Final  . Albumin 07/10/2019 4.2  3.5 - 5.0 g/dL Final  . AST 07/10/2019 24  15 - 41 U/L Final  . ALT 07/10/2019 27  0 - 44 U/L Final  . Alkaline Phosphatase 07/10/2019 92  38 - 126 U/L Final  . Total Bilirubin 07/10/2019 0.7  0.3 - 1.2 mg/dL Final  . GFR calc non Af Amer 07/10/2019 >60  >60 mL/min Final  . GFR calc Af Amer 07/10/2019 >60  >60 mL/min Final  . Anion gap 07/10/2019 11  5 - 15 Final   Performed at [facility], [address], [place], [place] [address]  . CEA 07/10/2019 2.9  0.0 - 4.7 ng/mL Final   Comment: (NOTE)                             Nonsmokers          <3.9                             Smokers             <5.6 [employer] Electrochemiluminescence Immunoassay (ECLIA) Values obtained with different assay methods or kits cannot be used interchangeably.  Results cannot be interpreted as absolute evidence of the presence or absence of malignant disease. Performed At: [facility] [place], [place] [id number] Rush Farmer MD [id number]   . WBC  07/10/2019 5.0  4.0 - 10.5 K/uL Final  . RBC 07/10/2019 5.56  4.22 - 5.81 MIL/uL Final  . Hemoglobin 07/10/2019 13.8  13.0 - 17.0 g/dL Final  . HCT 07/10/2019 44.2  39.0 - 52.0 % Final  . MCV 07/10/2019 79.5* 80.0 - 100.0 fL Final  . MCH 07/10/2019 24.8* 26.0 - 34.0 pg Final  . MCHC 07/10/2019 31.2  30.0 - 36.0 g/dL Final  . RDW 07/10/2019 15.4  11.5 - 15.5 % Final  . Platelets 07/10/2019 218  150 - 400 K/uL Final  . nRBC 07/10/2019 0.0  0.0 - 0.2 % Final  . Neutrophils Relative % 07/10/2019 60  % Final  . Neutro Abs 07/10/2019 3.0  1.7 - 7.7 K/uL Final  . Lymphocytes Relative 07/10/2019 27  % Final  . Lymphs Abs 07/10/2019 1.3  0.7 - 4.0 K/uL Final  . Monocytes Relative 07/10/2019 8  %  Final  . Monocytes Absolute 07/10/2019 0.4  0.1 - 1.0 K/uL Final  . Eosinophils Relative 07/10/2019 4  % Final  . Eosinophils Absolute 07/10/2019 0.2  0.0 - 0.5 K/uL Final  . Basophils Relative 07/10/2019 1  % Final  . Basophils Absolute 07/10/2019 0.0  0.0 - 0.1 K/uL Final  . Immature Granulocytes 07/10/2019 0  % Final  . Abs Immature Granulocytes 07/10/2019 0.01  0.00 - 0.07 K/uL Final   Performed at [facility], [address] [place], [place] [address]  . Iron 07/10/2019 57  45 - 182 ug/dL Final  . TIBC 07/10/2019 284  250 - 450 ug/dL Final  . Saturation Ratios 07/10/2019 20  17.9 - 39.5 % Final  . UIBC 07/10/2019 227  ug/dL Final   Performed at [facility], [address], [place], [place] [address]  . Ferritin 07/10/2019 127  24 - 336 ng/mL Final   Performed at [facility], [address] [place], [place] [address]    Assessment:  Donnelly Legall. is a 64 y.o. male with clinical stage IIA(T3N0) rectal cancer. Colonoscopy on 06/06/2014 revealed a large non-circumferential distal rectal mass. Biopsy was positive for adenocarcinoma.   CT scans revealed a 3 mm pulmonary nodule (indeterminate). There was a large rectal mass suspicious for transmural  extension into the mesorectum.There was equivocal perirectal and sigmoid mesocolon nodes. There was no liver metastasis or extrapelvic disease.  [facility]oscopic [facility] at [facility] revealed a 70% circumferential mass at 9-14 cm from the anal verge and measuring 9 mm in maximal thickness. Ultrasound suggested breakthrough of the muscularis propria with invasion into the perirectal fat.  He received neoadjuvant chemotherapy(continuous infusion 5FU) and radiation. He received radiation from 07/03/2014 until 08/16/2014. He underwent low anterior resection with diverting loop ileostomy on 10/23/2014. Pathology revealed a complete response. Zero of 10 lymph nodes were positive. He underwent ileostomy take-down on 09/08/2015.  He has iron deficiency anemia. Ferritin was 34 on 10/04/2014, 134 on 08/31/2016, 218 on 01/03/2017, 255 on 02/02/2017, 98 on 05/17/2017, and 112 on 11/21/2017. He is on oral iron(1 tablet/day).   He received 8 cycles of FOLFOXchemotherapy (12/27/2014 - 04/15/2015). 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   CEAhas been followed: 2.1 on 08/26/2014, 3.6 on 04/15/2015, 2.5 on 06/23/2015, 3.2 on 09/26/2015, 2.8 on 12/29/2015, 2.7 on 04/22/2016, 3.6 on 08/31/2016, 2.8 on 01/03/2017, 2.8 on 05/17/2017, 4.1 on 11/21/2017, 2.6 on 05/26/2018, 3.3 on 01/01/2019, and 2.9 on 07/10/2019.  Chest, abdomen, and pelvic CT on 08/25/2016 revealed expected post treatment changes in presacral region. There was no evidence of recurrent or metastatic carcinoma within the chest, abdomen, or pelvis.   Chest, abdomen, and pelvic CT on 11/15/2017 demonstrated stable post-treatment changes in the pre-sacral region/pelvis. No evidence of recurrent or metastatic disease. Stable 3 mm LLL and 4 mm lingular nodules observed. There were anterior abdominal wall hernias without evidence of obstruction.   He has significantperipheral vascular disease. He underwent  angioplasty of the left mid to distal posterior tibial artery, above-knee popliteal artery and entire SFA on 05/12/2015. He underwent stent 2 to the left SFA and popliteal artery for multiple areas of greater than 50% residual stenosis after angioplasty.   He has a history of increased liver function tests. He denies any new medications. He does not drink alcohol. Hepatitis B surface antigen, hepatitis B core antibody total, and hepatitis C antibody were negative on 08/31/2016.  He has a mildmicrocytic anemia. Ferritin was  255 with a sed rate of 58 on 02/02/2017. Ferritin was 98 on 05/17/2017, 112 on 11/21/2017, and 96 on 05/26/2018.  He hasB12 deficiency. B12 was180on 12/27/2017.He is on oral B12. B12 was 772 on 01/30/2018. Folate was 24 on 12/27/2017.  Chest, abdomen, and pelvis CT on 12/29/2018 revealed s/p rectal resection and reanastomosis.  There was no evidence of recurrent or metastatic disease in the chest, abdomen, or pelvis. There was coronary artery disease, mild aortic atherosclerosis with enlargement of the proximal descending thoracic aorta up to 3.4 x 3.4 cm, unchanged from prior examination, and severe mixed aortic atherosclerosis of the abdominal aorta, including focal ectasia of the right aspect of the infrarenal abdominal aorta, measuring up to the 2.8 x 2.4 cm and unchanged from prior. There was a fat and nonobstructed small bowel containing hernia of the right hemiabdomen at the site of a previous ostomy.  There was prostatomegaly.  He was diagnosed with COVID-19 in 11/2018.  Symptomatically, he feels "ok".  He denies any GI symptoms.  He has not had his colonoscopy.  Exam is stable.  Plan: 1.   Labs today: CBC with diff, CMP, CEA. 2.   Rectal cancer Clinically, he is doing well.   He denies any GI symptoms.   Exam is unremrakable..   Chest, abdomen, and pelvis CT on 12/29/2018 revealed no evidence of recurrent disease. Discuss need for colonoscopy  (late). Patient to follow-up with Dr Allen Norris. 3.   Renalinsufficiency BUN20. Creatinine 1.18(improved). Increase in creatinine in past has been felt secondary to IV contrast dye.  4.   Microcytic anemia Hematocrit 44.2.Hemoglobin 13.8.MVC 79.5. Ferritin 127 today. B12 961 and folate 16.8 on 01/01/2019. Continue iron rich foods. 5. B12 deficiency B12 was 961 on 01/01/2019.             He remains on oral B12.             Monitor B12 and folate yearly. 6. Chest, abdomen, and pelvis CT 01/01/2020. 7.   RTC for MD assessment, labs (CBC with diff, CMP, CEA- can be drawn before CT), and review of CT scans.  I discussed the assessment and treatment plan with the patient.  The patient was provided an opportunity to ask questions and all were answered.  The patient agreed with the plan and demonstrated an understanding of the instructions.  The patient was advised to call back if the symptoms worsen or if the condition fails to improve as anticipated.   Lequita Asal, MD, PhD    07/17/2019, 10:07 AM  I, Selena Batten, am acting as scribe for [employer]. Mike Gip, MD, PhD.  I, Tysen Roesler C. Mike Gip, MD, have reviewed the above documentation for accuracy and completeness, and I agree with the above.

## 2019-07-17 ENCOUNTER — Inpatient Hospital Stay (HOSPITAL_BASED_OUTPATIENT_CLINIC_OR_DEPARTMENT_OTHER): Payer: Medicare HMO | Admitting: Hematology and Oncology

## 2019-07-17 ENCOUNTER — Encounter: Payer: Self-pay | Admitting: Hematology and Oncology

## 2019-07-17 VITALS — BP 157/88 | HR 55 | Temp 97.7°F | Resp 18 | Ht 66.0 in | Wt 207.7 lb

## 2019-07-17 DIAGNOSIS — R718 Other abnormality of red blood cells: Secondary | ICD-10-CM | POA: Diagnosis not present

## 2019-07-17 DIAGNOSIS — C20 Malignant neoplasm of rectum: Secondary | ICD-10-CM

## 2019-07-17 DIAGNOSIS — E538 Deficiency of other specified B group vitamins: Secondary | ICD-10-CM | POA: Diagnosis not present

## 2019-07-17 DIAGNOSIS — Z85038 Personal history of other malignant neoplasm of large intestine: Secondary | ICD-10-CM | POA: Diagnosis not present

## 2019-07-17 NOTE — Progress Notes (Signed)
No new changes noted today 

## 2020-01-01 ENCOUNTER — Ambulatory Visit: Payer: Medicare HMO

## 2020-01-01 ENCOUNTER — Other Ambulatory Visit: Payer: Medicare HMO

## 2020-01-07 ENCOUNTER — Ambulatory Visit: Payer: Medicare HMO | Admitting: Hematology and Oncology

## 2020-01-10 ENCOUNTER — Inpatient Hospital Stay: Payer: Medicare HMO | Attending: Hematology and Oncology

## 2020-01-10 ENCOUNTER — Ambulatory Visit
Admission: RE | Admit: 2020-01-10 | Discharge: 2020-01-10 | Disposition: A | Discharge status: Home / Self Care | Payer: Medicare HMO | Source: Ambulatory Visit | Attending: Hematology and Oncology | Admitting: Hematology and Oncology

## 2020-01-10 ENCOUNTER — Other Ambulatory Visit: Payer: Self-pay

## 2020-01-10 DIAGNOSIS — E1140 Type 2 diabetes mellitus with diabetic neuropathy, unspecified: Secondary | ICD-10-CM | POA: Diagnosis not present

## 2020-01-10 DIAGNOSIS — I2510 Atherosclerotic heart disease of native coronary artery without angina pectoris: Secondary | ICD-10-CM | POA: Diagnosis not present

## 2020-01-10 DIAGNOSIS — R718 Other abnormality of red blood cells: Secondary | ICD-10-CM

## 2020-01-10 DIAGNOSIS — I252 Old myocardial infarction: Secondary | ICD-10-CM | POA: Diagnosis not present

## 2020-01-10 DIAGNOSIS — N189 Chronic kidney disease, unspecified: Secondary | ICD-10-CM | POA: Diagnosis not present

## 2020-01-10 DIAGNOSIS — I129 Hypertensive chronic kidney disease with stage 1 through stage 4 chronic kidney disease, or unspecified chronic kidney disease: Secondary | ICD-10-CM | POA: Diagnosis not present

## 2020-01-10 DIAGNOSIS — Z79899 Other long term (current) drug therapy: Secondary | ICD-10-CM | POA: Diagnosis not present

## 2020-01-10 DIAGNOSIS — Z87891 Personal history of nicotine dependence: Secondary | ICD-10-CM | POA: Diagnosis not present

## 2020-01-10 DIAGNOSIS — E1151 Type 2 diabetes mellitus with diabetic peripheral angiopathy without gangrene: Secondary | ICD-10-CM | POA: Diagnosis not present

## 2020-01-10 DIAGNOSIS — K219 Gastro-esophageal reflux disease without esophagitis: Secondary | ICD-10-CM | POA: Insufficient documentation

## 2020-01-10 DIAGNOSIS — M109 Gout, unspecified: Secondary | ICD-10-CM | POA: Insufficient documentation

## 2020-01-10 DIAGNOSIS — E785 Hyperlipidemia, unspecified: Secondary | ICD-10-CM | POA: Diagnosis not present

## 2020-01-10 DIAGNOSIS — E538 Deficiency of other specified B group vitamins: Secondary | ICD-10-CM

## 2020-01-10 DIAGNOSIS — E1122 Type 2 diabetes mellitus with diabetic chronic kidney disease: Secondary | ICD-10-CM | POA: Diagnosis not present

## 2020-01-10 DIAGNOSIS — C20 Malignant neoplasm of rectum: Secondary | ICD-10-CM | POA: Insufficient documentation

## 2020-01-10 LAB — CBC WITH DIFFERENTIAL/PLATELET
Abs Immature Granulocytes: 0.03 10*3/uL (ref 0.00–0.07)
Basophils Absolute: 0 10*3/uL (ref 0.0–0.1)
Basophils Relative: 1 %
Eosinophils Absolute: 0.2 10*3/uL (ref 0.0–0.5)
Eosinophils Relative: 3 %
HCT: 44.4 % (ref 39.0–52.0)
Hemoglobin: 13.9 g/dL (ref 13.0–17.0)
Immature Granulocytes: 1 %
Lymphocytes Relative: 18 %
Lymphs Abs: 1.1 10*3/uL (ref 0.7–4.0)
MCH: 25.8 pg — ABNORMAL LOW (ref 26.0–34.0)
MCHC: 31.3 g/dL (ref 30.0–36.0)
MCV: 82.4 fL (ref 80.0–100.0)
Monocytes Absolute: 0.4 10*3/uL (ref 0.1–1.0)
Monocytes Relative: 7 %
Neutro Abs: 4.4 10*3/uL (ref 1.7–7.7)
Neutrophils Relative %: 70 %
Platelets: 191 10*3/uL (ref 150–400)
RBC: 5.39 MIL/uL (ref 4.22–5.81)
RDW: 14.7 % (ref 11.5–15.5)
WBC: 6.2 10*3/uL (ref 4.0–10.5)
nRBC: 0 % (ref 0.0–0.2)

## 2020-01-10 LAB — COMPREHENSIVE METABOLIC PANEL
ALT: 46 U/L — ABNORMAL HIGH (ref 0–44)
AST: 33 U/L (ref 15–41)
Albumin: 4.6 g/dL (ref 3.5–5.0)
Alkaline Phosphatase: 90 U/L (ref 38–126)
Anion gap: 9 (ref 5–15)
BUN: 19 mg/dL (ref 8–23)
CO2: 28 mmol/L (ref 22–32)
Calcium: 9.5 mg/dL (ref 8.9–10.3)
Chloride: 100 mmol/L (ref 98–111)
Creatinine, Ser: 1.17 mg/dL (ref 0.61–1.24)
GFR calc Af Amer: >60 mL/min (ref >60)
GFR calc non Af Amer: >60 mL/min (ref >60)
Glucose, Bld: 162 mg/dL — ABNORMAL HIGH (ref 70–99)
Potassium: 3.8 mmol/L (ref 3.5–5.1)
Sodium: 137 mmol/L (ref 135–145)
Total Bilirubin: 0.5 mg/dL (ref 0.3–1.2)
Total Protein: 7.1 g/dL (ref 6.5–8.1)

## 2020-01-10 MED ORDER — IOHEXOL 300 MG/ML  SOLN
100.0000 mL | Freq: Once | INTRAMUSCULAR | Status: AC | PRN
  Administered 2020-01-10: 80 mL via INTRAVENOUS

## 2020-01-11 LAB — CEA: CEA: 5 ng/mL — ABNORMAL HIGH (ref 0.0–4.7)

## 2020-01-12 NOTE — Progress Notes (Signed)
[facility]  [address] [place], [place] [address] Phone: [phone]  Fax: [phone]   Clinic Day:  01/14/2020  Referring physician: No ref. provider found  Chief Complaint: [name] PAULDING [name] is a 64 y.o. male with clinical stage IIA rectal cancer who is seen for a 6 month assessment and review of interval CT scans.  HPI: The patient was last seen in the medical oncology clinic on 07/17/2019. At that time, he felt "ok".  He denied any GI symptoms. He had not had a colonoscopy. Exam was stable. Hematocrit was 44.2, hemoglobin 13.8, MCV 79.5, platelets 218,000, WBC 5,000. Ferritin was 127 with iron saturation of 20% and a TIBC of 284. CMP was normal. CEA was 2.9. He was advised to eat iron rich foods and continue oral B12.   Chest, abdomen and pelvis CT on 01/10/2020 showed no findings of recurrent malignancy. There was an ascending thoracic aortic aneurysm 4.1 cm in diameter. Recommend annual imaging followup by CTA or MRA.  Other imaging findings of potential clinical significance: coronary atherosclerosis, chronically stable small pulmonary nodules, right lateral abdominal wall hernia related to prior ostomy, containing adipose tissue and couple of loops of small bowel, without findings of strangulation or obstruction. There was prominent stool throughout the colon favors constipation, lumbar spondylosis and degenerative disc disease causing multilevel impingement. There was mild indistinctness of the prostate gland, without overt prostatomegaly. There was mild urinary bladder wall thickening given the degree of distension, cannot exclude mild cystitis, although this appears to be chronic and may be the result of prior radiation therapy.  Labs on 01/10/2020 showed hematocrit 44.4, hemoglobin 13.9, platelets 191,000, WBC 6,200. ALT was 46. CEA was 5.0.   During the interim, he was doing alright. His wife is doing well. He denies any abdominal symptoms.  He has not had a colonoscopy. He does not smoke but was exposed to second hand smoke in the past.  He has right knee pain and swelling. He has had no gout flare up.   Patient is COVID-19 vaccinated. He had no issues and is awaiting the booster shot.    Past Medical History:  Diagnosis Date  . Atherosclerosis   . Cancer ([place])   . Chronic kidney disease    kidney stones, UTI  . Colon cancer ([place])   . Coronary artery disease   . Diabetes mellitus without complication ([place])   . GERD (gastroesophageal reflux disease)   . Gout   . Hematuria   . Hyperlipidemia   . Hypertension   . Iron deficiency anemia   . Myocardial infarction ([place]) 2005  . Peripheral vascular disease ([place])   . Pulmonary nodules   . Rectal cancer ([place])   . Ureter, stricture   . Wears dentures    full upper    Past Surgical History:  Procedure Laterality Date  . BOWEL RESECTION N/A 10/23/2014   Procedure: LOW ANTERIOR BOWEL RESECTION;  Surgeon: Sherri Rad, MD;  Location: [facility];  Service: General;  Laterality: N/A;  . COLON SURGERY    . COLONOSCOPY 06/06/14    . COLONOSCOPY WITH ESOPHAGOGASTRODUODENOSCOPY (EGD)    . CORONARY ANGIOPLASTY WITH STENT PLACEMENT  03/2004  . CYSTOSCOPY WITH STENT PLACEMENT Bilateral 10/23/2014   Procedure: CYSTOSCOPY WITH STENT PLACEMENT,URETHRAL DILATION, LEFT RETROGRADE PYELOGRAM, URETEROSCOPY;  Surgeon: Hollice Espy, MD;  Location: [facility];  Service: Urology;  Laterality: Bilateral;  . DIVERTING ILEOSTOMY N/A 10/23/2014   Procedure: DIVERTING ILEOSTOMY;  Surgeon: Sherri Rad, MD;  Location: [facility];  Service: General;  Laterality: N/A;  . ILEOSTOMY CLOSURE N/A 09/08/2015   Procedure: ILEOSTOMY TAKEDOWN;  Surgeon: Clayburn Pert, MD;  Location: [facility];  Service: General;  Laterality: N/A;  . LAPAROTOMY N/A 10/23/2014   Procedure: EXPLORATORY LAPAROTOMY;  Surgeon: Sherri Rad, MD;  Location: [facility];  Service: General;  Laterality: N/A;  . PERIPHERAL VASCULAR CATHETERIZATION N/A  05/12/2015   Procedure: Abdominal Aortogram w/Lower Extremity;  Surgeon: Algernon Huxley, MD;  Location: [facility];  Service: Cardiovascular;  Laterality: N/A;  . PERIPHERAL VASCULAR CATHETERIZATION  05/12/2015   Procedure: Lower Extremity Intervention;  Surgeon: Algernon Huxley, MD;  Location: [facility];  Service: Cardiovascular;;  . PERIPHERAL VASCULAR CATHETERIZATION N/A 06/30/2015   Procedure: Abdominal Aortogram w/Lower Extremity;  Surgeon: Algernon Huxley, MD;  Location: [facility];  Service: Cardiovascular;  Laterality: N/A;  . PERIPHERAL VASCULAR CATHETERIZATION  06/30/2015   Procedure: Lower Extremity Intervention;  Surgeon: Algernon Huxley, MD;  Location: [facility];  Service: Cardiovascular;;  . PORT-A-CATH REMOVAL Right 09/08/2015   Procedure: REMOVAL PORT-A-CATH;  Surgeon: Clayburn Pert, MD;  Location: [facility];  Service: General;  Laterality: Right;  . PORTACATH PLACEMENT  07/01/2014   Dr. Marina Gravel    Family History  Problem Relation Age of Onset  . Hypertension Brother   . Hypertension Father   . Diabetes Father   . Diabetes Brother   . Hypertension Brother   . Heart disease Paternal Grandfather     Social History:  reports that he quit smoking about 35 years ago. His smoking use included cigarettes. He has never used smokeless tobacco. He reports that he does not drink alcohol and does not use drugs.  He lives in [place]. He is working on disability. The patient is alone today.  Allergies:  Allergies  Allergen Reactions  . No Known Allergies     Current Medications: Current Outpatient Medications  Medication Sig Dispense Refill  . acetaminophen (TYLENOL) 500 MG tablet Take 500 mg by mouth every 6 (six) hours as needed.    . Ascorbic Acid (VITAMIN C) 1000 MG tablet Take 1,000 mg by mouth daily. Reported on 09/26/2015    . cloNIDine (CATAPRES) 0.2 MG tablet Take 0.2 mg by mouth daily.    . clopidogrel (PLAVIX) 75 MG tablet Take 75 mg by mouth  daily.     . colchicine 0.6 MG tablet Take 0.6 mg by mouth daily.    . hydrochlorothiazide (MICROZIDE) 12.5 MG capsule Take 12.5 mg by mouth daily.      No current facility-administered medications for this visit.   Facility-Administered Medications Ordered in Other Visits  Medication Dose Route Frequency Provider Last Rate Last Admin  . heparin lock flush 100 unit/mL  500 Units Intravenous Once Jodette Wik C, MD      . sodium chloride 0.9 % injection 10 mL  10 mL Intravenous PRN Dallas Schimke, MD   10 mL at 08/26/14 1300  . sodium chloride flush (NS) 0.9 % injection 10 mL  10 mL Intravenous PRN Lequita Asal, MD        Review of Systems  Constitutional: Negative for chills, diaphoresis, fever, malaise/fatigue and weight loss (stable).       Doing alright.  HENT: Negative.  Negative for congestion, ear pain, nosebleeds, sinus pain and sore throat.   Eyes: Negative.  Negative for blurred vision, double vision and photophobia.  Respiratory: Negative.  Negative for cough, hemoptysis,  sputum production and shortness of breath.   Cardiovascular: Positive for leg swelling (right knee; bilateral hands). Negative for chest pain, palpitations, orthopnea and PND.       Peripheral vascular disease.  Gastrointestinal: Negative.  Negative for abdominal pain, blood in stool, constipation, diarrhea, melena, nausea and vomiting.  Genitourinary: Negative.  Negative for dysuria, frequency, hematuria and urgency.  Musculoskeletal: Positive for joint pain (right knee). Negative for back pain, falls, myalgias and neck pain.       No interval gout.  Skin: Negative.  Negative for itching and rash.  Neurological: Negative.  Negative for dizziness, tremors, speech change, focal weakness, seizures, weakness and headaches.  Endo/Heme/Allergies: Negative.  Does not bruise/bleed easily.  Psychiatric/Behavioral: Negative.  Negative for depression and memory loss. The patient is not nervous/anxious and  does not have insomnia.   All other systems reviewed and are negative.  Performance status (ECOG): 1  Vitals Blood pressure (!) 185/76, pulse 60, temperature 97.8 F (36.6 C), temperature source Tympanic, weight 208 lb 5.4 oz (94.5 kg), SpO2 100 %.   Physical Exam Vitals and nursing note reviewed.  Constitutional:      General: He is not in acute distress.    Appearance: Normal appearance. He is well-developed. He is not diaphoretic.     Comments: He has a cane by his side.  HENT:     Head: Normocephalic and atraumatic.     Comments: Short graying hair.    Nose: Nose normal.     Mouth/Throat:     Mouth: No oral lesions.     Pharynx: No oropharyngeal exudate.  Eyes:     General: No scleral icterus.    Conjunctiva/sclera: Conjunctivae normal.     Pupils: Pupils are equal, round, and reactive to light.     Comments: Brown eyes.  Neck:     Vascular: No JVD.  Cardiovascular:     Rate and Rhythm: Normal rate and regular rhythm.     Heart sounds: Normal heart sounds. No murmur heard.  No friction rub. No gallop.   Pulmonary:     Effort: Pulmonary effort is normal. No respiratory distress.     Breath sounds: Normal breath sounds. No wheezing, rhonchi or rales.  Chest:     Chest wall: No mass, tenderness or edema.     Breasts:        Right: No mass or tenderness.        Left: No mass or tenderness.  Abdominal:     General: Bowel sounds are normal.     Palpations: Abdomen is soft. There is no hepatomegaly, splenomegaly or mass.     Tenderness: There is no abdominal tenderness. There is no guarding or rebound.  Musculoskeletal:        General: No tenderness. Normal range of motion.     Cervical back: Normal range of motion and neck supple.  Lymphadenopathy:     Head:     Right side of head: No preauricular, posterior auricular or occipital adenopathy.     Left side of head: No preauricular, posterior auricular or occipital adenopathy.     Cervical: No cervical adenopathy.      Upper Body:     Right upper body: No supraclavicular or axillary adenopathy.     Left upper body: No supraclavicular or axillary adenopathy.     Lower Body: No right inguinal adenopathy. No left inguinal adenopathy.  Skin:    General: Skin is warm and dry.     Coloration:  Skin is not pale.     Findings: No bruising, erythema, lesion or rash.  Neurological:     Mental Status: He is alert and oriented to person, place, and time.  Psychiatric:        Behavior: Behavior normal.        Thought Content: Thought content normal.        Judgment: Judgment normal.    Imaging studies: 06/14/2014:  Chest, abdomen, and pelvis CT revealed a 3 mm pulmonary nodule (indeterminate). There was a large rectal mass suspicious for transmural extension into the mesorectum.There was equivocal perirectal and sigmoid mesocolon nodes. There was no liver metastasis or extrapelvic disease. 08/25/2016:  Chest, abdomen, and pelvic CT revealed expected post treatment changes in presacral region. There was no evidence of recurrent or metastatic carcinoma within the chest, abdomen, or pelvis.  11/15/2017:  Chest, abdomen, and pelvic CT demonstrated stable post-treatment changes in the pre-sacral region/pelvis. No evidence of recurrent or metastatic disease. Stable 3 mm LLL and 4 mm lingular nodules observed. There were anterior abdominal wall hernias without evidence of obstruction.  12/29/2018:  Chest, abdomen, and pelvis CT revealed s/p rectal resection and reanastomosis.  There was no evidence of recurrent or metastatic disease in the chest, abdomen, or pelvis. There was coronary artery disease, mild aortic atherosclerosis with enlargement of the proximal descending thoracic aorta up to 3.4 x 3.4 cm, unchanged from prior examination, and severe mixed aortic atherosclerosis of the abdominal aorta, including focal ectasia of the right aspect of the infrarenal abdominal aorta, measuring up to the 2.8 x 2.4 cm and unchanged from  prior. There was a fat and nonobstructed small bowel containing hernia of the right hemiabdomen at the site of a previous ostomy.  There was prostatomegaly.   01/10/2020:  Chest, abdomen and pelvis CT showed no findings of recurrent malignancy.  There was an ascending thoracic aortic aneurysm 4.1 cm in diameter. Recommend annual imaging followup by CTA or MRA.  Other imaging findings of potential clinical significance: coronary atherosclerosis, chronically stable small pulmonary nodules, right lateral abdominal wall hernia related to prior ostomy, containing adipose tissue and couple of loops of small bowel, without findings of strangulation or obstruction. There was prominent stool throughout the colon favors constipation, lumbar spondylosis and degenerative disc disease causing multilevel impingement. There was mild indistinctness of the prostate gland, without overt prostatomegaly. There was mild urinary bladder wall thickening given the degree of distension, cannot exclude mild cystitis, although this appears to be chronic and may be the result of prior radiation therapy.   No visits with results within 3 Day(s) from this visit.  Latest known visit with results is:  Appointment on 01/10/2020  Component Date Value Ref Range Status  . CEA 01/10/2020 5.0* 0.0 - 4.7 ng/mL Final   Comment: (NOTE)                             Nonsmokers          <3.9                             Smokers             <5.6 [employer] Electrochemiluminescence Immunoassay (ECLIA) Values obtained with different assay methods or kits cannot be used interchangeably.  Results cannot be interpreted as absolute evidence of the presence or absence of malignant disease. Performed At: [facility]  [place], [place] [id number] Rush Farmer MD DJ:[id number]   . Sodium 01/10/2020 137  135 - 145 mmol/L Final  . Potassium 01/10/2020 3.8  3.5 - 5.1 mmol/L Final  . Chloride 01/10/2020 100  98 - 111  mmol/L Final  . CO2 01/10/2020 28  22 - 32 mmol/L Final  . Glucose, Bld 01/10/2020 162* 70 - 99 mg/dL Final   Glucose reference range applies only to samples taken after fasting for at least 8 hours.  . BUN 01/10/2020 19  8 - 23 mg/dL Final  . Creatinine, Ser 01/10/2020 1.17  0.61 - 1.24 mg/dL Final  . Calcium 01/10/2020 9.5  8.9 - 10.3 mg/dL Final  . Total Protein 01/10/2020 7.1  6.5 - 8.1 g/dL Final  . Albumin 01/10/2020 4.6  3.5 - 5.0 g/dL Final  . AST 01/10/2020 33  15 - 41 U/L Final  . ALT 01/10/2020 46* 0 - 44 U/L Final  . Alkaline Phosphatase 01/10/2020 90  38 - 126 U/L Final  . Total Bilirubin 01/10/2020 0.5  0.3 - 1.2 mg/dL Final  . GFR calc non Af Amer 01/10/2020 >60  >60 mL/min Final  . GFR calc Af Amer 01/10/2020 >60  >60 mL/min Final  . Anion gap 01/10/2020 9  5 - 15 Final   Performed at [facility], [address] [place], [place] [address]  . WBC 01/10/2020 6.2  4.0 - 10.5 K/uL Final  . RBC 01/10/2020 5.39  4.22 - 5.81 MIL/uL Final  . Hemoglobin 01/10/2020 13.9  13.0 - 17.0 g/dL Final  . HCT 01/10/2020 44.4  39 - 52 % Final  . MCV 01/10/2020 82.4  80.0 - 100.0 fL Final  . MCH 01/10/2020 25.8* 26.0 - 34.0 pg Final  . MCHC 01/10/2020 31.3  30.0 - 36.0 g/dL Final  . RDW 01/10/2020 14.7  11.5 - 15.5 % Final  . Platelets 01/10/2020 191  150 - 400 K/uL Final  . nRBC 01/10/2020 0.0  0.0 - 0.2 % Final  . Neutrophils Relative % 01/10/2020 70  % Final  . Neutro Abs 01/10/2020 4.4  1.7 - 7.7 K/uL Final  . Lymphocytes Relative 01/10/2020 18  % Final  . Lymphs Abs 01/10/2020 1.1  0.7 - 4.0 K/uL Final  . Monocytes Relative 01/10/2020 7  % Final  . Monocytes Absolute 01/10/2020 0.4  0 - 1 K/uL Final  . Eosinophils Relative 01/10/2020 3  % Final  . Eosinophils Absolute 01/10/2020 0.2  0 - 0 K/uL Final  . Basophils Relative 01/10/2020 1  % Final  . Basophils Absolute 01/10/2020 0.0  0 - 0 K/uL Final  . Immature Granulocytes 01/10/2020 1  % Final  . Abs Immature  Granulocytes 01/10/2020 0.03  0.00 - 0.07 K/uL Final   Performed at [facility], [address] [place], [place] [address]    Assessment:  [name] CROPPER [name] is a 64 y.o. male with clinical stage IIA(T3N0) rectal cancer. Colonoscopy on 06/06/2014 revealed a large non-circumferential distal rectal mass. Biopsy was positive for adenocarcinoma.   Chest, abdomen, and pelvis CT on 02/26/20216 revealed a 3 mm pulmonary nodule (indeterminate). There was a large rectal mass suspicious for transmural extension into the mesorectum.There was equivocal perirectal and sigmoid mesocolon nodes. There was no liver metastasis or extrapelvic disease.  [facility]oscopic ultrasound at [facility] revealed a 70% circumferential mass at 9-14 cm from the anal verge and measuring 9 mm in maximal thickness. Ultrasound suggested breakthrough of the muscularis  propria with invasion into the perirectal fat.  He received neoadjuvant chemotherapy(continuous infusion 5FU) and radiation. He received radiation from 07/03/2014 until 08/16/2014. He underwent low anterior resection with diverting loop ileostomy on 10/23/2014. Pathology revealed a complete response. Zero of 10 lymph nodes were positive. He underwent ileostomy take-down on 09/08/2015.  He has iron deficiency anemia. Ferritin was 34 on 10/04/2014, 134 on 08/31/2016, 218 on 01/03/2017, 255 on 02/02/2017, 98 on 05/17/2017, and 112 on 11/21/2017. He is on oral iron(1 tablet/day).   He received 8 cycles of FOLFOXchemotherapy (12/27/2014 - 04/15/2015). 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   Chest, abdomen and pelvis CT on 01/10/2020 showed no findings of recurrent malignancy.  CEAhas been followed: 2.1 on 08/26/2014, 3.6 on 04/15/2015, 2.5 on 06/23/2015, 3.2 on 09/26/2015, 2.8 on 12/29/2015, 2.7 on 04/22/2016, 3.6 on 08/31/2016, 2.8 on 01/03/2017, 2.8 on 05/17/2017, 4.1 on 11/21/2017, 2.6 on 05/26/2018, 3.3 on  01/01/2019, 2.9 on 07/10/2019, and 5.0 on 01/10/2020.  He has significantperipheral vascular disease. He underwent angioplasty of the left mid to distal posterior tibial artery, above-knee popliteal artery and entire SFA on 05/12/2015. He underwent stent 2 to the left SFA and popliteal artery for multiple areas of greater than 50% residual stenosis after angioplasty.   He has a history of increased liver function tests. He denies any new medications. He does not drink alcohol. Hepatitis B surface antigen, hepatitis B core antibody total, and hepatitis C antibody were negative on 08/31/2016.  He has a mildmicrocytic anemia. Ferritin was 255 with a sed rate of 58 on 02/02/2017. Ferritin was 98 on 05/17/2017, 112 on 11/21/2017, and 96 on 05/26/2018.  He hasB12 deficiency. B12 was180on 12/27/2017.He is on oral B12. B12 was 772 on 01/30/2018. Folate was 24 on 12/27/2017.  He was diagnosed with COVID-19 in 11/2018.  Patient is COVID-19 vaccinated.   Symptomatically, he is doing "all right".  He has not had a colonoscopy.  Exam is stable.  CEA is 5.0.  Plan: 1.   Review labs from 01/10/2020. 2.   Rectal cancer Clinically, he is doing well Exam is unremarkable.   Chest, abdomen, and pelvis CT on 01/10/2020 was personally reviewed.  Agree with radiology findings.  No evidence of recurrent disease. CEA is 5.0 (elevated) on 01/10/2020.  Discuss repeating CEA in 1 month if remains elevated, follow-up PET scan. Discuss need for follow-up colonoscopy. 3.   History of renalinsufficiency BUN20. Creatinine 1.17. Increase in creatinine in past has been felt secondary to IV contrast dye.  4.   Microcytic anemia, resolved Hematocrit 44.2.Hemoglobin 13.8.MVC 79.5 on 07/10/2019.  Ferritin 127. Hematocrit 44.4.  Hemoglobin 13.9.  MCV 82.4 on 01/10/2020. B12 961 and folate 16.8 on 01/01/2019. Continue to monitor. 5. B12 deficiency B12 was 961 on 01/01/2019.              He remains on oral B12.             Check B12 and folate annually. 6. RTC in 1 month for labs (CEA, B12, folate). 7.   RTC in 6 months for MD assessment and labs (CBC with diff, CMP, CEA).  I discussed the assessment and treatment plan with the patient.  The patient was provided an opportunity to ask questions and all were answered.  The patient agreed with the plan and demonstrated an understanding of the instructions.  The patient was advised to call back if the symptoms worsen or if the condition fails to improve as anticipated.   Rivka Baune  Elmarie Mainland, MD, PhD    01/14/2020, 3:37 PM  I, Selena Batten, am acting as scribe for [employer]. Mike Gip, MD, PhD.  I, Erza Mothershead C. Mike Gip, MD, have reviewed the above documentation for accuracy and completeness, and I agree with the above.

## 2020-01-14 ENCOUNTER — Other Ambulatory Visit: Payer: Self-pay

## 2020-01-14 ENCOUNTER — Encounter: Payer: Self-pay | Admitting: Hematology and Oncology

## 2020-01-14 ENCOUNTER — Inpatient Hospital Stay (HOSPITAL_BASED_OUTPATIENT_CLINIC_OR_DEPARTMENT_OTHER): Payer: Medicare HMO | Admitting: Hematology and Oncology

## 2020-01-14 VITALS — BP 185/76 | HR 60 | Temp 97.8°F | Wt 208.3 lb

## 2020-01-14 DIAGNOSIS — C20 Malignant neoplasm of rectum: Secondary | ICD-10-CM | POA: Diagnosis not present

## 2020-01-14 DIAGNOSIS — D509 Iron deficiency anemia, unspecified: Secondary | ICD-10-CM

## 2020-01-14 DIAGNOSIS — R970 Elevated carcinoembryonic antigen [CEA]: Secondary | ICD-10-CM

## 2020-01-14 DIAGNOSIS — E538 Deficiency of other specified B group vitamins: Secondary | ICD-10-CM | POA: Diagnosis not present

## 2020-01-14 NOTE — Progress Notes (Signed)
No new changes noted today 

## 2020-01-20 DIAGNOSIS — R970 Elevated carcinoembryonic antigen [CEA]: Secondary | ICD-10-CM | POA: Insufficient documentation

## 2020-02-08 ENCOUNTER — Other Ambulatory Visit: Payer: Self-pay

## 2020-02-08 DIAGNOSIS — C20 Malignant neoplasm of rectum: Secondary | ICD-10-CM

## 2020-02-11 ENCOUNTER — Inpatient Hospital Stay: Payer: Medicare HMO | Attending: Hematology and Oncology

## 2020-02-11 ENCOUNTER — Other Ambulatory Visit: Payer: Self-pay

## 2020-02-11 DIAGNOSIS — N189 Chronic kidney disease, unspecified: Secondary | ICD-10-CM | POA: Diagnosis not present

## 2020-02-11 DIAGNOSIS — C20 Malignant neoplasm of rectum: Secondary | ICD-10-CM | POA: Diagnosis present

## 2020-02-11 DIAGNOSIS — M109 Gout, unspecified: Secondary | ICD-10-CM | POA: Diagnosis not present

## 2020-02-11 DIAGNOSIS — E1151 Type 2 diabetes mellitus with diabetic peripheral angiopathy without gangrene: Secondary | ICD-10-CM | POA: Insufficient documentation

## 2020-02-11 DIAGNOSIS — I2510 Atherosclerotic heart disease of native coronary artery without angina pectoris: Secondary | ICD-10-CM | POA: Diagnosis not present

## 2020-02-11 DIAGNOSIS — I129 Hypertensive chronic kidney disease with stage 1 through stage 4 chronic kidney disease, or unspecified chronic kidney disease: Secondary | ICD-10-CM | POA: Diagnosis not present

## 2020-02-11 DIAGNOSIS — Z79899 Other long term (current) drug therapy: Secondary | ICD-10-CM | POA: Insufficient documentation

## 2020-02-11 DIAGNOSIS — Z87891 Personal history of nicotine dependence: Secondary | ICD-10-CM | POA: Diagnosis not present

## 2020-02-11 DIAGNOSIS — E785 Hyperlipidemia, unspecified: Secondary | ICD-10-CM | POA: Insufficient documentation

## 2020-02-11 DIAGNOSIS — E1122 Type 2 diabetes mellitus with diabetic chronic kidney disease: Secondary | ICD-10-CM | POA: Insufficient documentation

## 2020-02-11 DIAGNOSIS — E1140 Type 2 diabetes mellitus with diabetic neuropathy, unspecified: Secondary | ICD-10-CM | POA: Diagnosis not present

## 2020-02-11 DIAGNOSIS — K219 Gastro-esophageal reflux disease without esophagitis: Secondary | ICD-10-CM | POA: Insufficient documentation

## 2020-02-11 DIAGNOSIS — I252 Old myocardial infarction: Secondary | ICD-10-CM | POA: Insufficient documentation

## 2020-02-12 LAB — CEA: CEA: 4.3 ng/mL (ref 0.0–4.7)

## 2020-07-14 ENCOUNTER — Ambulatory Visit: Payer: Medicare HMO | Admitting: Hematology and Oncology

## 2020-07-14 ENCOUNTER — Other Ambulatory Visit: Payer: Medicare HMO

## 2020-07-18 ENCOUNTER — Other Ambulatory Visit: Payer: Self-pay

## 2020-07-18 DIAGNOSIS — C20 Malignant neoplasm of rectum: Secondary | ICD-10-CM

## 2020-07-21 NOTE — Progress Notes (Signed)
[facility]  [address], [address] [place], [place] [address] Phone: [phone]  Fax: [phone]   Clinic Day:  07/22/2020  Referring physician: No ref. provider found  Chief Complaint: [name] BRINER [name] is a 65 y.o. male with clinical stage IIA rectal cancer who is seen for 6 month assessment.  HPI: The patient was last seen in the medical oncology clinic on 01/14/2020. At that time, he was doing "all right".  He had not had a colonoscopy.  Exam was stable. Hematocrit was 44.4, hemoglobin 13.9, MCV 82.4, platelets 191,000, WBC 6,200. ALT was 46. CEA was 5.0. He continued oral B12.  CEA was 4.3 on 02/11/2020.  During the interim, he has been "good." He denies abdominal symptoms including blood in the stool. His right knee pain and leg swelling have resolved. He has not had any gout.  He has not had a colonoscopy. He states he is waiting on a call from his GI doctor. He takes oral B12.   Past Medical History:  Diagnosis Date  . Atherosclerosis   . Cancer ([place])   . Chronic kidney disease    kidney stones, UTI  . Colon cancer ([place])   . Coronary artery disease   . Diabetes mellitus without complication ([place])   . GERD (gastroesophageal reflux disease)   . Gout   . Hematuria   . Hyperlipidemia   . Hypertension   . Iron deficiency anemia   . Myocardial infarction ([place]) 2005  . Peripheral vascular disease ([place])   . Pulmonary nodules   . Rectal cancer ([place])   . Ureter, stricture   . Wears dentures    full upper    Past Surgical History:  Procedure Laterality Date  . BOWEL RESECTION N/A 10/23/2014   Procedure: LOW ANTERIOR BOWEL RESECTION;  Surgeon: Sherri Rad, MD;  Location: [facility];  Service: General;  Laterality: N/A;  . COLON SURGERY    . COLONOSCOPY 06/06/14    . COLONOSCOPY WITH ESOPHAGOGASTRODUODENOSCOPY (EGD)    . CORONARY ANGIOPLASTY WITH STENT PLACEMENT  03/2004  . CYSTOSCOPY WITH STENT PLACEMENT Bilateral 10/23/2014   Procedure:  CYSTOSCOPY WITH STENT PLACEMENT,URETHRAL DILATION, LEFT RETROGRADE PYELOGRAM, URETEROSCOPY;  Surgeon: Hollice Espy, MD;  Location: [facility];  Service: Urology;  Laterality: Bilateral;  . DIVERTING ILEOSTOMY N/A 10/23/2014   Procedure: DIVERTING ILEOSTOMY;  Surgeon: Sherri Rad, MD;  Location: [facility];  Service: General;  Laterality: N/A;  . ILEOSTOMY CLOSURE N/A 09/08/2015   Procedure: ILEOSTOMY TAKEDOWN;  Surgeon: Clayburn Pert, MD;  Location: [facility];  Service: General;  Laterality: N/A;  . LAPAROTOMY N/A 10/23/2014   Procedure: EXPLORATORY LAPAROTOMY;  Surgeon: Sherri Rad, MD;  Location: [facility];  Service: General;  Laterality: N/A;  . PERIPHERAL VASCULAR CATHETERIZATION N/A 05/12/2015   Procedure: Abdominal Aortogram w/Lower Extremity;  Surgeon: Algernon Huxley, MD;  Location: [place] [facility];  Service: Cardiovascular;  Laterality: N/A;  . PERIPHERAL VASCULAR CATHETERIZATION  05/12/2015   Procedure: Lower Extremity Intervention;  Surgeon: Algernon Huxley, MD;  Location: [place] [facility];  Service: Cardiovascular;;  . PERIPHERAL VASCULAR CATHETERIZATION N/A 06/30/2015   Procedure: Abdominal Aortogram w/Lower Extremity;  Surgeon: Algernon Huxley, MD;  Location: [place] [facility];  Service: Cardiovascular;  Laterality: N/A;  . PERIPHERAL VASCULAR CATHETERIZATION  06/30/2015   Procedure: Lower Extremity Intervention;  Surgeon: Algernon Huxley, MD;  Location: [place] [facility];  Service: Cardiovascular;;  . PORT-A-CATH REMOVAL Right 09/08/2015   Procedure: REMOVAL PORT-A-CATH;  Surgeon:  Clayburn Pert, MD;  Location: [facility];  Service: General;  Laterality: Right;  . PORTACATH PLACEMENT  07/01/2014   Dr. Marina Gravel    Family History  Problem Relation Age of Onset  . Hypertension Brother   . Hypertension Father   . Diabetes Father   . Diabetes Brother   . Hypertension Brother   . Heart disease Paternal Grandfather     Social History:  reports that he quit smoking about 35 years ago. His smoking use  included cigarettes. He has never used smokeless tobacco. He reports that he does not drink alcohol and does not use drugs.  He lives in [place]. He is working on disability. The patient is alone today.  Allergies:  Allergies  Allergen Reactions  . No Known Allergies     Current Medications: Current Outpatient Medications  Medication Sig Dispense Refill  . acetaminophen (TYLENOL) 500 MG tablet Take 500 mg by mouth every 6 (six) hours as needed.    . Ascorbic Acid (VITAMIN C) 1000 MG tablet Take 1,000 mg by mouth daily. Reported on 09/26/2015    . cloNIDine (CATAPRES) 0.2 MG tablet Take 0.2 mg by mouth daily.    . clopidogrel (PLAVIX) 75 MG tablet Take 75 mg by mouth daily.     . colchicine 0.6 MG tablet Take 0.6 mg by mouth daily.    Marland Kitchen lisinopril-hydrochlorothiazide (ZESTORETIC) 10-12.5 MG tablet Take 1 by mouth daily in the morning for high blood pressure.    . hydrochlorothiazide (MICROZIDE) 12.5 MG capsule Take 12.5 mg by mouth daily.  (Patient not taking: Reported on 07/22/2020)     No current facility-administered medications for this visit.   Facility-Administered Medications Ordered in Other Visits  Medication Dose Route Frequency Provider Last Rate Last Admin  . heparin lock flush 100 unit/mL  500 Units Intravenous Once Melora Menon C, MD      . sodium chloride 0.9 % injection 10 mL  10 mL Intravenous PRN Dallas Schimke, MD   10 mL at 08/26/14 1300  . sodium chloride flush (NS) 0.9 % injection 10 mL  10 mL Intravenous PRN Lequita Asal, MD        Review of Systems  Constitutional: Positive for weight loss (1 lb). Negative for chills, diaphoresis, fever and malaise/fatigue.       Feels "good."  HENT: Negative.  Negative for congestion, ear discharge, ear pain, hearing loss, nosebleeds, sinus pain, sore throat and tinnitus.   Eyes: Negative.  Negative for blurred vision and double vision.  Respiratory: Negative.  Negative for cough, hemoptysis, sputum production  and shortness of breath.   Cardiovascular: Negative for chest pain, palpitations and leg swelling.       Peripheral vascular disease.  Gastrointestinal: Negative.  Negative for abdominal pain, blood in stool, constipation, diarrhea, heartburn, melena, nausea and vomiting.  Genitourinary: Negative.  Negative for dysuria, frequency, hematuria and urgency.  Musculoskeletal: Negative for back pain, falls, joint pain, myalgias and neck pain.       No interval gout.  Skin: Negative.  Negative for itching and rash.  Neurological: Negative.  Negative for dizziness, tremors, speech change, focal weakness, seizures, weakness and headaches.  Endo/Heme/Allergies: Negative.  Does not bruise/bleed easily.  Psychiatric/Behavioral: Negative.  Negative for depression and memory loss. The patient is not nervous/anxious and does not have insomnia.   All other systems reviewed and are negative.  Performance status (ECOG): 0-1  Vitals Blood pressure (!) 152/91, pulse 60, temperature (!) 96.2 F (35.7 C), temperature  source Tympanic, resp. rate 18, weight 207 lb 3.7 oz (94 kg), SpO2 100 %.   Physical Exam Vitals and nursing note reviewed.  Constitutional:      General: He is not in acute distress.    Appearance: Normal appearance. He is well-developed. He is not diaphoretic.     Comments: He has a cane by his side.  HENT:     Head: Normocephalic and atraumatic.     Comments: Short graying hair.    Nose: Nose normal.     Mouth/Throat:     Mouth: No oral lesions.     Pharynx: No oropharyngeal exudate.  Eyes:     General: No scleral icterus.    Conjunctiva/sclera: Conjunctivae normal.     Pupils: Pupils are equal, round, and reactive to light.     Comments: Brown eyes.  Neck:     Vascular: No JVD.  Cardiovascular:     Rate and Rhythm: Normal rate and regular rhythm.     Heart sounds: Normal heart sounds. No murmur heard. No friction rub. No gallop.   Pulmonary:     Effort: Pulmonary effort is  normal. No respiratory distress.     Breath sounds: Normal breath sounds. No wheezing, rhonchi or rales.  Chest:     Chest wall: No mass, tenderness or edema.  Breasts:     Right: No mass, tenderness, axillary adenopathy or supraclavicular adenopathy.     Left: No mass, tenderness, axillary adenopathy or supraclavicular adenopathy.    Abdominal:     General: Bowel sounds are normal.     Palpations: Abdomen is soft. There is no hepatomegaly, splenomegaly or mass.     Tenderness: There is no abdominal tenderness. There is no guarding or rebound.  Musculoskeletal:        General: No tenderness. Normal range of motion.     Cervical back: Normal range of motion and neck supple.  Lymphadenopathy:     Head:     Right side of head: No preauricular, posterior auricular or occipital adenopathy.     Left side of head: No preauricular, posterior auricular or occipital adenopathy.     Cervical: No cervical adenopathy.     Upper Body:     Right upper body: No supraclavicular or axillary adenopathy.     Left upper body: No supraclavicular or axillary adenopathy.     Lower Body: No right inguinal adenopathy. No left inguinal adenopathy.  Skin:    General: Skin is warm and dry.     Coloration: Skin is not pale.     Findings: No bruising, erythema, lesion or rash.  Neurological:     Mental Status: He is alert and oriented to person, place, and time.  Psychiatric:        Behavior: Behavior normal.        Thought Content: Thought content normal.        Judgment: Judgment normal.    Imaging studies: 06/14/2014:  Chest, abdomen, and pelvis CT revealed a 3 mm pulmonary nodule (indeterminate). There was a large rectal mass suspicious for transmural extension into the mesorectum.There was equivocal perirectal and sigmoid mesocolon nodes. There was no liver metastasis or extrapelvic disease. 08/25/2016:  Chest, abdomen, and pelvic CT revealed expected post treatment changes in presacral region. There  was no evidence of recurrent or metastatic carcinoma within the chest, abdomen, or pelvis.  11/15/2017:  Chest, abdomen, and pelvic CT demonstrated stable post-treatment changes in the pre-sacral region/pelvis. No evidence of recurrent or metastatic  disease. Stable 3 mm LLL and 4 mm lingular nodules observed. There were anterior abdominal wall hernias without evidence of obstruction.  12/29/2018:  Chest, abdomen, and pelvis CT revealed s/p rectal resection and reanastomosis.  There was no evidence of recurrent or metastatic disease in the chest, abdomen, or pelvis. There was coronary artery disease, mild aortic atherosclerosis with enlargement of the proximal descending thoracic aorta up to 3.4 x 3.4 cm, unchanged from prior examination, and severe mixed aortic atherosclerosis of the abdominal aorta, including focal ectasia of the right aspect of the infrarenal abdominal aorta, measuring up to the 2.8 x 2.4 cm and unchanged from prior. There was a fat and nonobstructed small bowel containing hernia of the right hemiabdomen at the site of a previous ostomy.  There was prostatomegaly.   01/10/2020:  Chest, abdomen and pelvis CT showed no findings of recurrent malignancy.  There was an ascending thoracic aortic aneurysm 4.1 cm in diameter. Recommend annual imaging followup by CTA or MRA.  Other imaging findings of potential clinical significance: coronary atherosclerosis, chronically stable small pulmonary nodules, right lateral abdominal wall hernia related to prior ostomy, containing adipose tissue and couple of loops of small bowel, without findings of strangulation or obstruction. There was prominent stool throughout the colon favors constipation, lumbar spondylosis and degenerative disc disease causing multilevel impingement. There was mild indistinctness of the prostate gland, without overt prostatomegaly. There was mild urinary bladder wall thickening given the degree of distension, cannot exclude mild  cystitis, although this appears to be chronic and may be the result of prior radiation therapy.   Appointment on 07/22/2020  Component Date Value Ref Range Status  . WBC 07/22/2020 5.1  4.0 - 10.5 K/uL Final  . RBC 07/22/2020 5.08  4.22 - 5.81 MIL/uL Final  . Hemoglobin 07/22/2020 13.4  13.0 - 17.0 g/dL Final  . HCT 07/22/2020 41.5  39.0 - 52.0 % Final  . MCV 07/22/2020 81.7  80.0 - 100.0 fL Final  . MCH 07/22/2020 26.4  26.0 - 34.0 pg Final  . MCHC 07/22/2020 32.3  30.0 - 36.0 g/dL Final  . RDW 07/22/2020 14.1  11.5 - 15.5 % Final  . Platelets 07/22/2020 221  150 - 400 K/uL Final  . nRBC 07/22/2020 0.0  0.0 - 0.2 % Final  . Neutrophils Relative % 07/22/2020 57  % Final  . Neutro Abs 07/22/2020 2.9  1.7 - 7.7 K/uL Final  . Lymphocytes Relative 07/22/2020 29  % Final  . Lymphs Abs 07/22/2020 1.5  0.7 - 4.0 K/uL Final  . Monocytes Relative 07/22/2020 8  % Final  . Monocytes Absolute 07/22/2020 0.4  0.1 - 1.0 K/uL Final  . Eosinophils Relative 07/22/2020 6  % Final  . Eosinophils Absolute 07/22/2020 0.3  0.0 - 0.5 K/uL Final  . Basophils Relative 07/22/2020 0  % Final  . Basophils Absolute 07/22/2020 0.0  0.0 - 0.1 K/uL Final  . Immature Granulocytes 07/22/2020 0  % Final  . Abs Immature Granulocytes 07/22/2020 0.02  0.00 - 0.07 K/uL Final   Performed at [facility], [address], [place], [place] [address]    Assessment:  [name] NDIAYE [name] is a 65 y.o. male with clinical stage IIA(T3N0) rectal cancer. Colonoscopy on 06/06/2014 revealed a large non-circumferential distal rectal mass. Biopsy was positive for adenocarcinoma.   Chest, abdomen, and pelvis CT on 02/26/20216 revealed a 3 mm pulmonary nodule (indeterminate). There was a large rectal mass suspicious for transmural extension into the mesorectum.There  was equivocal perirectal and sigmoid mesocolon nodes. There was no liver metastasis or extrapelvic disease.  Endoscopic ultrasound at [facility] revealed  a 70% circumferential mass at 9-14 cm from the anal verge and measuring 9 mm in maximal thickness. Ultrasound suggested breakthrough of the muscularis propria with invasion into the perirectal fat.  He received neoadjuvant chemotherapy(continuous infusion 5FU) and radiation. He received radiation from 07/03/2014 until 08/16/2014. He underwent low anterior resection with diverting loop ileostomy on 10/23/2014. Pathology revealed a complete response. Zero of 10 lymph nodes were positive. He underwent ileostomy take-down on 09/08/2015.  He has iron deficiency anemia. Ferritin was 34 on 10/04/2014, 134 on 08/31/2016, 218 on 01/03/2017, 255 on 02/02/2017, 98 on 05/17/2017, and 112 on 11/21/2017. He is on oral iron(1 tablet/day).   He received 8 cycles of FOLFOXchemotherapy (12/27/2014 - 04/15/2015). 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   Chest, abdomen and pelvis CT on 01/10/2020 showed no findings of recurrent malignancy.  CEAhas been followed: 2.1 on 08/26/2014, 3.6 on 04/15/2015, 2.5 on 06/23/2015, 3.2 on 09/26/2015, 2.8 on 12/29/2015, 2.7 on 04/22/2016, 3.6 on 08/31/2016, 2.8 on 01/03/2017, 2.8 on 05/17/2017, 4.1 on 11/21/2017, 2.6 on 05/26/2018, 3.3 on 01/01/2019, 2.9 on 07/10/2019, 5.0 on 01/10/2020, 4.3 on 02/11/2020, and 4.7 on 07/22/2020.  He has significantperipheral vascular disease. He underwent angioplasty of the left mid to distal posterior tibial artery, above-knee popliteal artery and entire SFA on 05/12/2015. He underwent stent 2 to the left SFA and popliteal artery for multiple areas of greater than 50% residual stenosis after angioplasty.   He has a history of increased liver function tests. He denies any new medications. He does not drink alcohol. Hepatitis B surface antigen, hepatitis B core antibody total, and hepatitis C antibody were negative on 08/31/2016.  He has a mildmicrocytic anemia. Ferritin was 255 with a sed  rate of 58 on 02/02/2017. Ferritin was 98 on 05/17/2017, 112 on 11/21/2017, and 96 on 05/26/2018.  He hasB12 deficiency. B12 was180on 12/27/2017.He is on oral B12. B12 was 772 on 01/30/2018. Folate was 24 on 12/27/2017.  He was diagnosed with COVID-19 in 11/2018.  Patient is COVID-19 vaccinated.   Symptomatically, he feels "good." He denies abdominal symptoms including blood in the stool. He has not had a colonoscopy. He states he is waiting on a call from his GI doctor. He takes oral B12.  Exam is stable.  Plan: 1.   Labs today: CBC with diff, CMP, CEA. 2.   Rectal cancer Clinically, he is doing well. Exam is stable.   Chest, abdomen, and pelvis CT on 01/10/2020 revealed no evidence of recurrent disease. CEA is 4.7 (normal) today. Review need for colonoscopy- note to Dr Allen Norris. Continue to monitor. 3.   History of renalinsufficiency BUN25. Creatinine 1.25. Increase in creatinine in past has been felt secondary to IV contrast dye.  Continue to monitor. 4.   Microcytic anemia, resolved Hematocrit 41.5.Hemoglobin 13.4.MVC 81.7 today.  Ferritin 127 with an iron saturation of 20% and a TIBC 284 on 07/10/2019. B12 961 and folate 16.8 on 01/01/2019. Continue to monitor CBC with each visit. 5. B12 deficiency B12 was 961 on 01/01/2019.             He continues oral B12.             Check B12 and folate annually. 6.   Chest, abdomen, and pelvis CT on 01/09/2021. 7.   RTC in 6 months for MD assessment, labs (CBC with diff, CMP, CEA),  and review of imaging.  I discussed the assessment and treatment plan with the patient.  The patient was provided an opportunity to ask questions and all were answered.  The patient agreed with the plan and demonstrated an understanding of the instructions.  The patient was advised to call back if the symptoms worsen or if the condition fails to improve as anticipated.  I provided 15 minutes of face-to-face time during this this  encounter and > 50% was spent counseling as documented under my assessment and plan. An additional 5 minutes were spent reviewing his chart (Epic and Care Everywhere) including notes, labs, and imaging studies.    Lequita Asal, MD, PhD    07/22/2020, 10:27 AM  I, Mirian Mo Tufford, am acting as Education administrator for [employer]. Mike Gip, MD, PhD.  I, Ruchama Kubicek C. Mike Gip, MD, have reviewed the above documentation for accuracy and completeness, and I agree with the above.

## 2020-07-22 ENCOUNTER — Inpatient Hospital Stay (HOSPITAL_BASED_OUTPATIENT_CLINIC_OR_DEPARTMENT_OTHER): Payer: Medicare HMO | Admitting: Hematology and Oncology

## 2020-07-22 ENCOUNTER — Other Ambulatory Visit: Payer: Self-pay

## 2020-07-22 ENCOUNTER — Encounter: Payer: Self-pay | Admitting: Hematology and Oncology

## 2020-07-22 ENCOUNTER — Telehealth: Payer: Self-pay

## 2020-07-22 ENCOUNTER — Inpatient Hospital Stay: Payer: Medicare HMO | Attending: Hematology and Oncology

## 2020-07-22 VITALS — BP 152/91 | HR 60 | Temp 96.2°F | Resp 18 | Wt 207.2 lb

## 2020-07-22 DIAGNOSIS — C20 Malignant neoplasm of rectum: Secondary | ICD-10-CM | POA: Insufficient documentation

## 2020-07-22 DIAGNOSIS — Z87891 Personal history of nicotine dependence: Secondary | ICD-10-CM | POA: Insufficient documentation

## 2020-07-22 DIAGNOSIS — D509 Iron deficiency anemia, unspecified: Secondary | ICD-10-CM | POA: Diagnosis not present

## 2020-07-22 DIAGNOSIS — G620 Drug-induced polyneuropathy: Secondary | ICD-10-CM | POA: Diagnosis not present

## 2020-07-22 DIAGNOSIS — E538 Deficiency of other specified B group vitamins: Secondary | ICD-10-CM | POA: Diagnosis not present

## 2020-07-22 LAB — COMPREHENSIVE METABOLIC PANEL
ALT: 66 U/L — ABNORMAL HIGH (ref 0–44)
AST: 41 U/L (ref 15–41)
Albumin: 4.2 g/dL (ref 3.5–5.0)
Alkaline Phosphatase: 92 U/L (ref 38–126)
Anion gap: 9 (ref 5–15)
BUN: 25 mg/dL — ABNORMAL HIGH (ref 8–23)
CO2: 28 mmol/L (ref 22–32)
Calcium: 9.5 mg/dL (ref 8.9–10.3)
Chloride: 98 mmol/L (ref 98–111)
Creatinine, Ser: 1.25 mg/dL — ABNORMAL HIGH (ref 0.61–1.24)
GFR, Estimated: >60 mL/min (ref >60)
Glucose, Bld: 188 mg/dL — ABNORMAL HIGH (ref 70–99)
Potassium: 3.4 mmol/L — ABNORMAL LOW (ref 3.5–5.1)
Sodium: 135 mmol/L (ref 135–145)
Total Bilirubin: 0.5 mg/dL (ref 0.3–1.2)
Total Protein: 7.2 g/dL (ref 6.5–8.1)

## 2020-07-22 LAB — CBC WITH DIFFERENTIAL/PLATELET
Abs Immature Granulocytes: 0.02 10*3/uL (ref 0.00–0.07)
Basophils Absolute: 0 10*3/uL (ref 0.0–0.1)
Basophils Relative: 0 %
Eosinophils Absolute: 0.3 10*3/uL (ref 0.0–0.5)
Eosinophils Relative: 6 %
HCT: 41.5 % (ref 39.0–52.0)
Hemoglobin: 13.4 g/dL (ref 13.0–17.0)
Immature Granulocytes: 0 %
Lymphocytes Relative: 29 %
Lymphs Abs: 1.5 10*3/uL (ref 0.7–4.0)
MCH: 26.4 pg (ref 26.0–34.0)
MCHC: 32.3 g/dL (ref 30.0–36.0)
MCV: 81.7 fL (ref 80.0–100.0)
Monocytes Absolute: 0.4 10*3/uL (ref 0.1–1.0)
Monocytes Relative: 8 %
Neutro Abs: 2.9 10*3/uL (ref 1.7–7.7)
Neutrophils Relative %: 57 %
Platelets: 221 10*3/uL (ref 150–400)
RBC: 5.08 MIL/uL (ref 4.22–5.81)
RDW: 14.1 % (ref 11.5–15.5)
WBC: 5.1 10*3/uL (ref 4.0–10.5)
nRBC: 0 % (ref 0.0–0.2)

## 2020-07-22 NOTE — Telephone Encounter (Signed)
Unable to reach pt. Left VM notifying patient of MD recommendation. Provided ofc ph number for him to call back if he has any questions.

## 2020-07-22 NOTE — Telephone Encounter (Signed)
-----   Message from Lequita Asal, MD sent at 07/22/2020 11:07 AM EDT ----- Regarding: Please call patient  Slight increase in LFTs.  Recheck LFTS in 1 month.  M  ----- Message ----- From: Buel Ream, Lab In [place] Sent: 07/22/2020   9:20 AM EDT To: Lequita Asal, MD

## 2020-07-23 LAB — CEA: CEA: 4.7 ng/mL (ref 0.0–4.7)

## 2020-07-23 NOTE — Telephone Encounter (Signed)
07/23/2020 Spoke w/ pt and informed him of RN VM with MD recommendation. Appt made for 5/9 @ 10:30 for repeat labs, pt agreeable to appt  SRW

## 2020-08-19 NOTE — Progress Notes (Signed)
This patient had rectal cancer and needs a colonoscopy if they have not had one in the last 3 years

## 2020-08-22 ENCOUNTER — Other Ambulatory Visit: Payer: Self-pay

## 2020-08-22 DIAGNOSIS — R718 Other abnormality of red blood cells: Secondary | ICD-10-CM

## 2020-08-22 DIAGNOSIS — R970 Elevated carcinoembryonic antigen [CEA]: Secondary | ICD-10-CM

## 2020-08-22 DIAGNOSIS — C20 Malignant neoplasm of rectum: Secondary | ICD-10-CM

## 2020-08-25 ENCOUNTER — Inpatient Hospital Stay: Payer: Medicare HMO | Attending: Nurse Practitioner

## 2020-08-25 ENCOUNTER — Other Ambulatory Visit: Payer: Self-pay

## 2020-08-25 DIAGNOSIS — R970 Elevated carcinoembryonic antigen [CEA]: Secondary | ICD-10-CM | POA: Insufficient documentation

## 2020-08-25 DIAGNOSIS — C20 Malignant neoplasm of rectum: Secondary | ICD-10-CM | POA: Insufficient documentation

## 2020-08-25 DIAGNOSIS — R718 Other abnormality of red blood cells: Secondary | ICD-10-CM

## 2020-08-25 LAB — HEPATIC FUNCTION PANEL
ALT: 59 U/L — ABNORMAL HIGH (ref 0–44)
AST: 37 U/L (ref 15–41)
Albumin: 4.2 g/dL (ref 3.5–5.0)
Alkaline Phosphatase: 94 U/L (ref 38–126)
Bilirubin, Direct: <0.1 mg/dL (ref 0.0–0.2)
Total Bilirubin: 0.7 mg/dL (ref 0.3–1.2)
Total Protein: 6.9 g/dL (ref 6.5–8.1)

## 2020-09-01 ENCOUNTER — Telehealth: Payer: Self-pay

## 2020-09-01 NOTE — Telephone Encounter (Signed)
-----   Message from Lucilla Lame, MD sent at 08/19/2020  7:35 AM EDT -----   ----- Message ----- From: Lequita Asal, MD Sent: 08/18/2020   3:39 PM EDT To: Lucilla Lame, MD

## 2020-09-01 NOTE — Telephone Encounter (Signed)
Left vm for pt to return my call to schedule repeat Colonoscopy.

## 2020-09-29 ENCOUNTER — Other Ambulatory Visit: Payer: Self-pay | Admitting: Nurse Practitioner

## 2020-09-29 DIAGNOSIS — R7989 Other specified abnormal findings of blood chemistry: Secondary | ICD-10-CM

## 2020-10-01 NOTE — Telephone Encounter (Signed)
Left vm again for pt to return my call. Mailed letter.  

## 2021-01-15 ENCOUNTER — Other Ambulatory Visit: Payer: Medicare HMO

## 2021-01-16 ENCOUNTER — Ambulatory Visit (HOSPITAL_COMMUNITY): Admission: RE | Admit: 2021-01-16 | Payer: Medicare HMO | Source: Ambulatory Visit

## 2021-01-19 ENCOUNTER — Ambulatory Visit: Payer: Medicare HMO | Admitting: Hematology and Oncology

## 2021-01-22 ENCOUNTER — Other Ambulatory Visit: Payer: Medicare HMO

## 2021-01-23 ENCOUNTER — Other Ambulatory Visit: Payer: Medicare HMO

## 2021-01-23 ENCOUNTER — Ambulatory Visit: Payer: Medicare HMO | Admitting: Nurse Practitioner

## 2021-01-27 ENCOUNTER — Inpatient Hospital Stay: Payer: Medicare HMO | Admitting: Nurse Practitioner

## 2021-01-27 ENCOUNTER — Inpatient Hospital Stay: Payer: Medicare HMO | Attending: Nurse Practitioner

## 2021-01-27 VITALS — BP 123/75 | HR 65 | Temp 98.0°F | Resp 18 | Wt 193.0 lb

## 2021-01-27 DIAGNOSIS — Z85038 Personal history of other malignant neoplasm of large intestine: Secondary | ICD-10-CM | POA: Diagnosis not present

## 2021-01-27 DIAGNOSIS — R7989 Other specified abnormal findings of blood chemistry: Secondary | ICD-10-CM

## 2021-01-27 DIAGNOSIS — C20 Malignant neoplasm of rectum: Secondary | ICD-10-CM | POA: Diagnosis not present

## 2021-01-27 DIAGNOSIS — Z08 Encounter for follow-up examination after completed treatment for malignant neoplasm: Secondary | ICD-10-CM | POA: Diagnosis not present

## 2021-01-27 DIAGNOSIS — D509 Iron deficiency anemia, unspecified: Secondary | ICD-10-CM

## 2021-01-27 DIAGNOSIS — E538 Deficiency of other specified B group vitamins: Secondary | ICD-10-CM

## 2021-01-27 DIAGNOSIS — R718 Other abnormality of red blood cells: Secondary | ICD-10-CM

## 2021-01-27 DIAGNOSIS — R970 Elevated carcinoembryonic antigen [CEA]: Secondary | ICD-10-CM | POA: Insufficient documentation

## 2021-01-27 LAB — CBC WITH DIFFERENTIAL/PLATELET
Abs Immature Granulocytes: 0.03 10*3/uL (ref 0.00–0.07)
Basophils Absolute: 0 10*3/uL (ref 0.0–0.1)
Basophils Relative: 1 %
Eosinophils Absolute: 0.2 10*3/uL (ref 0.0–0.5)
Eosinophils Relative: 3 %
HCT: 42.3 % (ref 39.0–52.0)
Hemoglobin: 13.2 g/dL (ref 13.0–17.0)
Immature Granulocytes: 1 %
Lymphocytes Relative: 14 %
Lymphs Abs: 0.8 10*3/uL (ref 0.7–4.0)
MCH: 26.2 pg (ref 26.0–34.0)
MCHC: 31.2 g/dL (ref 30.0–36.0)
MCV: 84.1 fL (ref 80.0–100.0)
Monocytes Absolute: 0.3 10*3/uL (ref 0.1–1.0)
Monocytes Relative: 5 %
Neutro Abs: 4.4 10*3/uL (ref 1.7–7.7)
Neutrophils Relative %: 76 %
Platelets: 200 10*3/uL (ref 150–400)
RBC: 5.03 MIL/uL (ref 4.22–5.81)
RDW: 14.7 % (ref 11.5–15.5)
WBC: 5.8 10*3/uL (ref 4.0–10.5)
nRBC: 0 % (ref 0.0–0.2)

## 2021-01-27 LAB — IRON AND TIBC
Iron: 53 ug/dL (ref 45–182)
Saturation Ratios: 19 % (ref 17.9–39.5)
TIBC: 279 ug/dL (ref 250–450)
UIBC: 226 ug/dL

## 2021-01-27 LAB — COMPREHENSIVE METABOLIC PANEL
ALT: 54 U/L — ABNORMAL HIGH (ref 0–44)
AST: 37 U/L (ref 15–41)
Albumin: 4.2 g/dL (ref 3.5–5.0)
Alkaline Phosphatase: 140 U/L — ABNORMAL HIGH (ref 38–126)
Anion gap: 11 (ref 5–15)
BUN: 27 mg/dL — ABNORMAL HIGH (ref 8–23)
CO2: 28 mmol/L (ref 22–32)
Calcium: 9.4 mg/dL (ref 8.9–10.3)
Chloride: 94 mmol/L — ABNORMAL LOW (ref 98–111)
Creatinine, Ser: 1.41 mg/dL — ABNORMAL HIGH (ref 0.61–1.24)
GFR, Estimated: 55 mL/min — ABNORMAL LOW (ref >60)
Glucose, Bld: 199 mg/dL — ABNORMAL HIGH (ref 70–99)
Potassium: 4 mmol/L (ref 3.5–5.1)
Sodium: 133 mmol/L — ABNORMAL LOW (ref 135–145)
Total Bilirubin: 0.5 mg/dL (ref 0.3–1.2)
Total Protein: 6.9 g/dL (ref 6.5–8.1)

## 2021-01-27 LAB — FERRITIN: Ferritin: 146 ng/mL (ref 24–336)

## 2021-01-27 NOTE — Progress Notes (Signed)
[facility]  [address] [place], [name] [address] Phone: [phone]  Fax: [phone]   Clinic Day:  01/27/2021  Referring physician: No ref. provider found  Chief Complaint: [name] BURCHER [name] is a 65 y.o. male with clinical stage IIA rectal cancer who is seen for 6 month assessment.  HPI:  [name] MALINOSKI [name] is a 65 y.o. male with clinical stage IIA (T3N0) rectal cancer.  Colonoscopy on 06/06/2014 revealed a large non-circumferential distal rectal mass. Biopsy was positive for adenocarcinoma.    Chest, abdomen, and pelvis CT on 02/26/20216 revealed a 3 mm pulmonary nodule (indeterminate). There was a large rectal mass suspicious for transmural extension into the mesorectum.There was equivocal perirectal and sigmoid mesocolon nodes. There was no liver metastasis or extrapelvic disease.  Endoscopic ultrasound at [facility] revealed a 70% circumferential mass at 9-14 cm from the anal verge and measuring 9 mm in maximal thickness. Ultrasound suggested breakthrough of the muscularis propria with invasion into the perirectal fat.   He received neoadjuvant chemotherapy (continuous infusion 5FU) and radiation. He received radiation from 07/03/2014 until 08/16/2014. He underwent low anterior resection with diverting loop ileostomy on 10/23/2014.  Pathology revealed a complete response.  Zero of 10 lymph nodes were positive.  He underwent ileostomy take-down on 09/08/2015.   He has iron deficiency anemia.  Ferritin was 34 on 10/04/2014, 134 on 08/31/2016, 218 on 01/03/2017, 255 on 02/02/2017, 98 on 05/17/2017, and 112 on 11/21/2017.  He is on oral iron (1 tablet/day).     He received 8 cycles of FOLFOX chemotherapy (12/27/2014 - 04/15/2015).  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     Chest, abdomen and pelvis CT on 01/10/2020 showed no findings of recurrent malignancy.  CEA has been followed:  2.1 on 08/26/2014, 3.6  on 04/15/2015, 2.5 on 06/23/2015, 3.2 on 09/26/2015, 2.8 on 12/29/2015, 2.7 on 04/22/2016, 3.6 on 08/31/2016, 2.8 on 01/03/2017, 2.8 on 05/17/2017, 4.1 on 11/21/2017, 2.6 on 05/26/2018, 3.3 on 01/01/2019, 2.9 on 07/10/2019, 5.0 on 01/10/2020, 4.3 on 02/11/2020, and 4.7 on 07/22/2020.   He has significant peripheral vascular disease. He underwent angioplasty of the left mid to distal posterior tibial artery, above-knee popliteal artery and entire SFA on 05/12/2015.  He underwent stent 2 to the left SFA  and popliteal artery for multiple areas of greater than 50% residual stenosis after angioplasty.     He has a history of increased liver function tests.  He denies any new medications.  He does not drink alcohol.  Hepatitis B surface antigen, hepatitis B core antibody total, and hepatitis C antibody were negative on 08/31/2016.   He has a mild microcytic anemia.  Ferritin was 255 with a sed rate of 58 on 02/02/2017.  Ferritin was 98 on 05/17/2017, 112 on 11/21/2017, and 96 on 05/26/2018.   He has B12 deficiency.  B12 was 180 on 12/27/2017.  He is on oral B12.  B12 was 772 on 01/30/2018.  Folate was 24 on 12/27/2017.  He was diagnosed with COVID-19 in 11/2018.  Patient is COVID-19 vaccinated.    Interval History: Patient is 65 year old male who returns to clinic for labs, and continued surveillance of above history of rectal cancer.  He continues to feel well and denies specific complaints.   Past Medical History:  Diagnosis Date   Atherosclerosis    Cancer ([place])    Chronic kidney disease    kidney stones, UTI   Colon cancer ([place])  Coronary artery disease    Diabetes mellitus without complication (HCC)    GERD (gastroesophageal reflux disease)    Gout    Hematuria    Hyperlipidemia    Hypertension    Iron deficiency anemia    Myocardial infarction [facility]) 2005   Peripheral vascular disease (HCC)    Pulmonary nodules    Rectal cancer (HCC)    Ureter, stricture    Wears dentures     full upper    Past Surgical History:  Procedure Laterality Date   BOWEL RESECTION N/A 10/23/2014   Procedure: LOW ANTERIOR BOWEL RESECTION;  Surgeon: Sherri Rad, MD;  Location: [facility] ORS;  Service: General;  Laterality: N/A;   COLON SURGERY     COLONOSCOPY 06/06/14     COLONOSCOPY WITH ESOPHAGOGASTRODUODENOSCOPY (EGD)     CORONARY ANGIOPLASTY WITH STENT PLACEMENT  03/2004   CYSTOSCOPY WITH STENT PLACEMENT Bilateral 10/23/2014   Procedure: CYSTOSCOPY WITH STENT PLACEMENT,URETHRAL DILATION, LEFT RETROGRADE PYELOGRAM, URETEROSCOPY;  Surgeon: Hollice Espy, MD;  Location: [facility];  Service: Urology;  Laterality: Bilateral;   DIVERTING ILEOSTOMY N/A 10/23/2014   Procedure: DIVERTING ILEOSTOMY;  Surgeon: Sherri Rad, MD;  Location: [facility];  Service: General;  Laterality: N/A;   ILEOSTOMY CLOSURE N/A 09/08/2015   Procedure: ILEOSTOMY TAKEDOWN;  Surgeon: Clayburn Pert, MD;  Location: [facility];  Service: General;  Laterality: N/A;   LAPAROTOMY N/A 10/23/2014   Procedure: EXPLORATORY LAPAROTOMY;  Surgeon: Sherri Rad, MD;  Location: [facility];  Service: General;  Laterality: N/A;   PERIPHERAL VASCULAR CATHETERIZATION N/A 05/12/2015   Procedure: Abdominal Aortogram w/Lower Extremity;  Surgeon: Algernon Huxley, MD;  Location: [place] [facility];  Service: Cardiovascular;  Laterality: N/A;   PERIPHERAL VASCULAR CATHETERIZATION  05/12/2015   Procedure: Lower Extremity Intervention;  Surgeon: Algernon Huxley, MD;  Location: [place] [facility];  Service: Cardiovascular;;   PERIPHERAL VASCULAR CATHETERIZATION N/A 06/30/2015   Procedure: Abdominal Aortogram w/Lower Extremity;  Surgeon: Algernon Huxley, MD;  Location: [place] [facility];  Service: Cardiovascular;  Laterality: N/A;   PERIPHERAL VASCULAR CATHETERIZATION  06/30/2015   Procedure: Lower Extremity Intervention;  Surgeon: Algernon Huxley, MD;  Location: [place] [facility];  Service: Cardiovascular;;   PORT-A-CATH REMOVAL Right 09/08/2015   Procedure: REMOVAL PORT-A-CATH;   Surgeon: Clayburn Pert, MD;  Location: [facility];  Service: General;  Laterality: Right;   PORTACATH PLACEMENT  07/01/2014   Dr. Marina Gravel    Family History  Problem Relation Age of Onset   Hypertension Brother    Hypertension Father    Diabetes Father    Diabetes Brother    Hypertension Brother    Heart disease Paternal Grandfather     Social History:  reports that he quit smoking about 36 years ago. His smoking use included cigarettes. He has never used smokeless tobacco. He reports that he does not drink alcohol and does not use drugs.  He lives in [place].  He is working on disability.   Allergies:  Allergies  Allergen Reactions   No Known Allergies     Current Medications: Current Outpatient Medications  Medication Sig Dispense Refill   acetaminophen (TYLENOL) 500 MG tablet Take 500 mg by mouth every 6 (six) hours as needed.     Ascorbic Acid (VITAMIN C) 1000 MG tablet Take 1,000 mg by mouth daily. Reported on 09/26/2015     cloNIDine (CATAPRES) 0.2 MG tablet Take 0.2 mg by mouth daily.     clopidogrel (PLAVIX) 75 MG tablet Take 75  mg by mouth daily.      colchicine 0.6 MG tablet Take 0.6 mg by mouth daily.     JARDIANCE 10 MG TABS tablet Take 10 mg by mouth daily.     lisinopril-hydrochlorothiazide (ZESTORETIC) 10-12.5 MG tablet Take 1 by mouth daily in the morning for high blood pressure.     TRADJENTA 5 MG TABS tablet Take 5 mg by mouth daily.     atorvastatin (LIPITOR) 20 MG tablet Take 20 mg by mouth at bedtime. (Patient not taking: Reported on 01/27/2021)     atorvastatin (LIPITOR) 40 MG tablet Take 40 mg by mouth at bedtime. (Patient not taking: Reported on 01/27/2021)     hydrochlorothiazide (MICROZIDE) 12.5 MG capsule Take 12.5 mg by mouth daily.  (Patient not taking: No sig reported)     No current facility-administered medications for this visit.   Facility-Administered Medications Ordered in Other Visits  Medication Dose Route Frequency Provider Last Rate Last  Admin   heparin lock flush 100 unit/mL  500 Units Intravenous Once Corcoran, Melissa C, MD       sodium chloride 0.9 % injection 10 mL  10 mL Intravenous PRN Dallas Schimke, MD   10 mL at 08/26/14 1300   sodium chloride flush (NS) 0.9 % injection 10 mL  10 mL Intravenous PRN Lequita Asal, MD        Review of Systems  Constitutional:  Negative for chills, fever, malaise/fatigue and weight loss.  HENT:  Negative for hearing loss, nosebleeds, sore throat and tinnitus.   Eyes:  Negative for blurred vision and double vision.  Respiratory:  Negative for cough, hemoptysis, shortness of breath and wheezing.   Cardiovascular:  Negative for chest pain, palpitations and leg swelling.  Gastrointestinal:  Negative for abdominal pain, blood in stool, constipation, diarrhea, melena, nausea and vomiting.  Genitourinary:  Negative for dysuria and urgency.  Musculoskeletal:  Negative for back pain, falls, joint pain and myalgias.  Skin:  Negative for itching and rash.  Neurological:  Negative for dizziness, tingling, sensory change, loss of consciousness, weakness and headaches.  Endo/Heme/Allergies:  Negative for environmental allergies. Does not bruise/bleed easily.  Psychiatric/Behavioral:  Negative for depression. The patient is not nervous/anxious and does not have insomnia.   Performance status (ECOG): 0-1  Vitals Blood pressure 123/75, pulse 65, temperature 98 F (36.7 C), temperature source Tympanic, resp. rate 18, weight 193 lb (87.5 kg), SpO2 100 %.   Physical Exam Constitutional:      Appearance: He is not ill-appearing.  Cardiovascular:     Rate and Rhythm: Normal rate and regular rhythm.  Pulmonary:     Effort: No respiratory distress.     Breath sounds: No wheezing.  Abdominal:     General: There is no distension.     Tenderness: There is no abdominal tenderness. There is no guarding or rebound.  Musculoskeletal:        General: No deformity.     Right lower leg: No edema.      Left lower leg: No edema.  Lymphadenopathy:     Cervical: No cervical adenopathy.  Skin:    Coloration: Skin is not jaundiced or pale.  Neurological:     Mental Status: He is alert and oriented to person, place, and time.     Motor: No weakness.  Psychiatric:        Mood and Affect: Mood normal.        Behavior: Behavior normal.   Lab studies: CBC EXTENDED Latest  Ref Rng & Units 01/27/2021 07/22/2020 01/10/2020  WBC 4.0 - 10.5 K/uL 5.8 5.1 6.2  RBC 4.22 - 5.81 MIL/uL 5.03 5.08 5.39  HGB 13.0 - 17.0 g/dL 13.2 13.4 13.9  HCT 39.0 - 52.0 % 42.3 41.5 44.4  PLT 150 - 400 K/uL 200 221 191  NEUTROABS 1.7 - 7.7 K/uL 4.4 2.9 4.4  LYMPHSABS 0.7 - 4.0 K/uL 0.8 1.5 1.1   CMP Latest Ref Rng & Units 01/27/2021 08/25/2020 07/22/2020  Glucose 70 - 99 mg/dL 199(H) - 188(H)  BUN 8 - 23 mg/dL 27(H) - 25(H)  Creatinine 0.61 - 1.24 mg/dL 1.41(H) - 1.25(H)  Sodium 135 - 145 mmol/L 133(L) - 135  Potassium 3.5 - 5.1 mmol/L 4.0 - 3.4(L)  Chloride 98 - 111 mmol/L 94(L) - 98  CO2 22 - 32 mmol/L 28 - 28  Calcium 8.9 - 10.3 mg/dL 9.4 - 9.5  Total Protein 6.5 - 8.1 g/dL 6.9 6.9 7.2  Total Bilirubin 0.3 - 1.2 mg/dL 0.5 0.7 0.5  Alkaline Phos 38 - 126 U/L 140(H) 94 92  AST 15 - 41 U/L 37 37 41  ALT 0 - 44 U/L 54(H) 59(H) 66(H)   Iron/TIBC/Ferritin/ %Sat    Component Value Date/Time   IRON 53 01/27/2021 1102   IRON 136 03/06/2014 1309   TIBC 279 01/27/2021 1102   TIBC 304 03/06/2014 1309   FERRITIN 146 01/27/2021 1102   FERRITIN 11 05/08/2014 1554   IRONPCTSAT 19 01/27/2021 1102   IRONPCTSAT 45 03/06/2014 1309     Imaging studies: I independently reviewed below imaging and agree with findings  02/03/2021-CT chest abdomen pelvis without contrast for rectal cancer restaging No findings of recurrent malignancy.  Incidental finding of ascending thoracic aortic aneurysm measuring 4.1 cm in diameter.  Coronary atherosclerosis.  Chronic stable small pulmonary nodules.  Right lateral abdominal wall  hernia related to prior ostomy containing adipose tissue and couple loops of small bowel without evidence of strangulation or obstruction.  Constipation.  Spondylosis and DDD.  Assessment & Plan:   Rectal Cancer- clinically stable. CT abdomen pelvis from last year was negative for recurrent or metastatic disease. Plan to repeat this year.  CEA continues to be normal.  I again reviewed recommendation for colonoscopy.  Encouraged him to call Dr. Dorothey Baseman office to schedule.  History of renal insufficiency-previously had increasing creatinine felt to be secondary to IV contrast dye.  Encouraged hydration.  We will perform imaging this year without contrast.  Monitor closely. Microcytic anemia-ferritin normal at 146, iron saturation 19%.  Microcytosis and anemia have resolved.  Monitor. B12 deficiency-consider checking B12 and folate annually  RTC in 6 months for labs (CBC, CMP, CEA) and follow-up with MD to establish care  I discussed the assessment and treatment plan with the patient.  The patient was provided an opportunity to ask questions and all were answered.  The patient agreed with the plan and demonstrated an understanding of the instructions.  The patient was advised to call back if the symptoms worsen or if the condition fails to improve as anticipated.  Beckey Rutter, DNP, AGNP-C [place] at [facility] [phone] (clinic)

## 2021-01-28 LAB — CEA: CEA: 4.1 ng/mL (ref 0.0–4.7)

## 2021-02-03 ENCOUNTER — Other Ambulatory Visit: Payer: Self-pay

## 2021-02-03 ENCOUNTER — Ambulatory Visit
Admission: RE | Admit: 2021-02-03 | Discharge: 2021-02-03 | Disposition: A | Discharge status: Home / Self Care | Payer: Medicare HMO | Source: Ambulatory Visit | Attending: Nurse Practitioner | Admitting: Nurse Practitioner

## 2021-02-03 DIAGNOSIS — Z85038 Personal history of other malignant neoplasm of large intestine: Secondary | ICD-10-CM | POA: Insufficient documentation

## 2021-02-03 DIAGNOSIS — Z08 Encounter for follow-up examination after completed treatment for malignant neoplasm: Secondary | ICD-10-CM | POA: Insufficient documentation

## 2021-02-06 ENCOUNTER — Telehealth: Payer: Self-pay

## 2021-02-06 NOTE — Telephone Encounter (Signed)
No answer, voicemail has a generic greeting.  Message left for patient to return call for results and I will call again on Monday.

## 2021-02-06 NOTE — Telephone Encounter (Signed)
-----   Message from Verlon Au, NP sent at 02/06/2021  3:46 PM EDT ----- Could you call patient with CT results and let him know that everything looks good? Thanks!   ----- Message ----- From: Interface, Rad Results In Sent: 02/03/2021  11:36 PM EDT To: Verlon Au, NP

## 2021-02-09 NOTE — Telephone Encounter (Signed)
Message left on voicemail that recent imaging looks good.

## 2021-02-18 ENCOUNTER — Telehealth: Payer: Self-pay

## 2021-02-18 NOTE — Telephone Encounter (Signed)
Attempted to reach patient to ask him if he had followed-up with GI for colonoscopy. No answer; left message for him to return my call.

## 2021-07-30 ENCOUNTER — Telehealth: Payer: Self-pay

## 2021-07-30 ENCOUNTER — Inpatient Hospital Stay: Payer: Medicare HMO

## 2021-07-30 ENCOUNTER — Inpatient Hospital Stay: Payer: Medicare HMO | Admitting: Oncology

## 2021-07-30 ENCOUNTER — Telehealth: Payer: Self-pay | Admitting: Oncology

## 2021-07-30 NOTE — Telephone Encounter (Signed)
SPouse called in to resch appt that was missed 04/13 -complete  ?

## 2021-07-30 NOTE — Telephone Encounter (Signed)
Called to follow up with patient after he no showed to his appt with Dr Tasia Catchings at 10:15 am. Per wife they were unaware he had a appt. Wife states they received a message stating that "that someone in their household has an appt". Wife had an appt yesterday, they assumed the message was in reference to her appt. Advised that we would get him rescheduled. Wife requesting that we contact her for appt. Number listed in chart. ? ? ? ?Please schedule patient for:  ?Labs /MD next available. Please inform wife of appt. Thanks ?

## 2021-08-06 ENCOUNTER — Inpatient Hospital Stay: Payer: Medicare HMO | Admitting: Oncology

## 2021-08-06 ENCOUNTER — Inpatient Hospital Stay: Payer: Medicare HMO

## 2022-02-05 ENCOUNTER — Other Ambulatory Visit: Payer: Self-pay

## 2022-02-05 DIAGNOSIS — C20 Malignant neoplasm of rectum: Secondary | ICD-10-CM

## 2022-02-08 ENCOUNTER — Inpatient Hospital Stay (HOSPITAL_BASED_OUTPATIENT_CLINIC_OR_DEPARTMENT_OTHER): Payer: Medicare HMO | Admitting: Oncology

## 2022-02-08 ENCOUNTER — Encounter: Payer: Self-pay | Admitting: Oncology

## 2022-02-08 ENCOUNTER — Other Ambulatory Visit: Payer: Self-pay

## 2022-02-08 ENCOUNTER — Inpatient Hospital Stay: Payer: Medicare HMO | Attending: Oncology

## 2022-02-08 VITALS — BP 118/92 | HR 83 | Temp 97.8°F | Resp 16 | Wt 176.0 lb

## 2022-02-08 DIAGNOSIS — C20 Malignant neoplasm of rectum: Secondary | ICD-10-CM | POA: Diagnosis not present

## 2022-02-08 DIAGNOSIS — Z87891 Personal history of nicotine dependence: Secondary | ICD-10-CM | POA: Insufficient documentation

## 2022-02-08 DIAGNOSIS — Z7984 Long term (current) use of oral hypoglycemic drugs: Secondary | ICD-10-CM | POA: Insufficient documentation

## 2022-02-08 DIAGNOSIS — R634 Abnormal weight loss: Secondary | ICD-10-CM | POA: Diagnosis not present

## 2022-02-08 DIAGNOSIS — I129 Hypertensive chronic kidney disease with stage 1 through stage 4 chronic kidney disease, or unspecified chronic kidney disease: Secondary | ICD-10-CM | POA: Diagnosis not present

## 2022-02-08 DIAGNOSIS — E1122 Type 2 diabetes mellitus with diabetic chronic kidney disease: Secondary | ICD-10-CM | POA: Diagnosis not present

## 2022-02-08 DIAGNOSIS — D508 Other iron deficiency anemias: Secondary | ICD-10-CM | POA: Diagnosis not present

## 2022-02-08 DIAGNOSIS — D509 Iron deficiency anemia, unspecified: Secondary | ICD-10-CM | POA: Diagnosis not present

## 2022-02-08 DIAGNOSIS — Z85048 Personal history of other malignant neoplasm of rectum, rectosigmoid junction, and anus: Secondary | ICD-10-CM | POA: Diagnosis present

## 2022-02-08 DIAGNOSIS — Z7902 Long term (current) use of antithrombotics/antiplatelets: Secondary | ICD-10-CM | POA: Diagnosis not present

## 2022-02-08 DIAGNOSIS — Z79899 Other long term (current) drug therapy: Secondary | ICD-10-CM | POA: Insufficient documentation

## 2022-02-08 DIAGNOSIS — N189 Chronic kidney disease, unspecified: Secondary | ICD-10-CM | POA: Insufficient documentation

## 2022-02-08 LAB — CBC WITH DIFFERENTIAL/PLATELET
Abs Immature Granulocytes: 0.01 10*3/uL (ref 0.00–0.07)
Basophils Absolute: 0 10*3/uL (ref 0.0–0.1)
Basophils Relative: 0 %
Eosinophils Absolute: 0 10*3/uL (ref 0.0–0.5)
Eosinophils Relative: 1 %
HCT: 35 % — ABNORMAL LOW (ref 39.0–52.0)
Hemoglobin: 11.6 g/dL — ABNORMAL LOW (ref 13.0–17.0)
Immature Granulocytes: 0 %
Lymphocytes Relative: 11 %
Lymphs Abs: 0.7 10*3/uL (ref 0.7–4.0)
MCH: 26 pg (ref 26.0–34.0)
MCHC: 33.1 g/dL (ref 30.0–36.0)
MCV: 78.3 fL — ABNORMAL LOW (ref 80.0–100.0)
Monocytes Absolute: 0.5 10*3/uL (ref 0.1–1.0)
Monocytes Relative: 9 %
Neutro Abs: 4.9 10*3/uL (ref 1.7–7.7)
Neutrophils Relative %: 79 %
Platelets: 245 10*3/uL (ref 150–400)
RBC: 4.47 MIL/uL (ref 4.22–5.81)
RDW: 18.6 % — ABNORMAL HIGH (ref 11.5–15.5)
WBC: 6.2 10*3/uL (ref 4.0–10.5)
nRBC: 0 % (ref 0.0–0.2)

## 2022-02-08 LAB — TSH: TSH: 2.585 u[IU]/mL (ref 0.350–4.500)

## 2022-02-08 LAB — COMPREHENSIVE METABOLIC PANEL
ALT: 13 U/L (ref 0–44)
AST: 23 U/L (ref 15–41)
Albumin: 4.2 g/dL (ref 3.5–5.0)
Alkaline Phosphatase: 148 U/L — ABNORMAL HIGH (ref 38–126)
Anion gap: 11 (ref 5–15)
BUN: 21 mg/dL (ref 8–23)
CO2: 24 mmol/L (ref 22–32)
Calcium: 9.3 mg/dL (ref 8.9–10.3)
Chloride: 102 mmol/L (ref 98–111)
Creatinine, Ser: 1.35 mg/dL — ABNORMAL HIGH (ref 0.61–1.24)
GFR, Estimated: 57 mL/min — ABNORMAL LOW (ref >60)
Glucose, Bld: 121 mg/dL — ABNORMAL HIGH (ref 70–99)
Potassium: 3.7 mmol/L (ref 3.5–5.1)
Sodium: 137 mmol/L (ref 135–145)
Total Bilirubin: 1.4 mg/dL — ABNORMAL HIGH (ref 0.3–1.2)
Total Protein: 7 g/dL (ref 6.5–8.1)

## 2022-02-08 LAB — RETIC PANEL
Immature Retic Fract: 17.5 % — ABNORMAL HIGH (ref 2.3–15.9)
RBC.: 4.46 MIL/uL (ref 4.22–5.81)
Retic Count, Absolute: 64.7 10*3/uL (ref 19.0–186.0)
Retic Ct Pct: 1.5 % (ref 0.4–3.1)
Reticulocyte Hemoglobin: 31.3 pg (ref >27.9)

## 2022-02-08 LAB — IRON AND TIBC
Iron: 44 ug/dL — ABNORMAL LOW (ref 45–182)
Saturation Ratios: 17 % — ABNORMAL LOW (ref 17.9–39.5)
TIBC: 260 ug/dL (ref 250–450)
UIBC: 216 ug/dL

## 2022-02-08 LAB — FERRITIN: Ferritin: 119 ng/mL (ref 24–336)

## 2022-02-08 NOTE — Assessment & Plan Note (Signed)
Check TSH Repeat CT chest abdomen pelvis wo contrast 

## 2022-02-08 NOTE — Assessment & Plan Note (Signed)
CEA is pending.  Recommend patient to repeat colonoscopy. Refer to GI

## 2022-02-08 NOTE — Progress Notes (Signed)
Pts daughter will like to discuss loss of weight.

## 2022-02-08 NOTE — Progress Notes (Signed)
Hematology/Oncology Consult Note [phone] [phone]    ASSESSMENT & PLAN:   Rectal cancer ([place]) CEA is pending.  Recommend patient to repeat colonoscopy. Refer to GI  Unintentional weight loss Check TSH Repeat CT chest abdomen pelvis wo contrast  Iron deficiency anemia Refer to GI Continue oral iron supplementation 3 time per week.    Orders Placed This Encounter  Procedures   CT CHEST ABDOMEN PELVIS WO CONTRAST    Standing Status:   Future    Standing Expiration Date:   02/09/2023    Order Specific Question:   Preferred imaging location?    Answer:   [facility]   Iron and TIBC    Standing Status:   Future    Number of Occurrences:   1    Standing Expiration Date:   02/09/2023   Ferritin    Standing Status:   Future    Number of Occurrences:   1    Standing Expiration Date:   02/09/2023   Retic Panel    Standing Status:   Future    Number of Occurrences:   1    Standing Expiration Date:   02/09/2023   TSH    Standing Status:   Future    Number of Occurrences:   1    Standing Expiration Date:   02/09/2023   Ambulatory referral to Gastroenterology    Referral Priority:   Routine    Referral Type:   Consultation    Referral Reason:   Specialty Services Required    Referred to Provider:   Lucilla Lame, MD    Number of Visits Requested:   1   Follow up to be determined. Depending on CT scan.  All questions were answered. The patient knows to call the clinic with any problems, questions or concerns.  Earlie Server, MD, PhD [facility] Hematology Oncology 02/08/2022   CHIEF COMPLAINTS/PURPOSE OF CONSULTATION:  Follow up for history of rectal cancer.   HISTORY OF PRESENTING ILLNESS:  [name] Kehr [name] 66 y.o. male presents to establish care for history rectal cancer.  Patient previously followed up by Dr.Corcoran, patient switched care to me on 02/08/22 Extensive medical record review was performed by me  I have reviewed his  chart and materials related to his cancer extensively and collaborated history with the patient. Summary of oncologic history is as follows: Oncology History  Rectal cancer ([place])  07/03/2014 - 08/16/2014 Chemotherapy   Continuous infusion 5-FU with Radiation   08/22/2014 Initial Diagnosis   Rectal cancer ([place]) cT3N0 -06/06/2014 colonoscopy revealed a large non-circumferential distal rectal mass. Biopsy was positive for adenocarcinoma.  -EUS 70% circumferential mass at 9-14 cm from the anal verge and measuring 9 mm in maximal thickness. Ultrasound suggested breakthrough of the muscularis propria with invasion into the perirectal fat.   10/23/2014 Surgery   underwent low anterior resection with diverting loop ileostomy Pathology revealed a complete response.  Zero of 10 lymph nodes were positive.   He underwent ileostomy take-down on 09/08/2015.   12/27/2014 - 04/15/2015 Chemotherapy   Adjuvant with chemotherapy 8 cycles of FOLFOX    02/03/2021 Imaging   CT chest abdomen pelvis wo contrast 1. Stable noncontrast examination post low anterior resection and reanastomosis. No evidence of local recurrence or metastatic disease within the chest, abdomen, or pelvis. 2. Mild thickening of the urinary bladder wall is again seen, slightly asymmetric to the right posterior aspect of the urinary bladder on today's examination, findings which appear chronic in may  be related to prior radiation therapy. 3. Stable tiny left-sided pulmonary nodules, largest measuring 4 mm. No new or worse from pulmonary nodules. 4. Similar appearance of the fat and nonobstructed small bowel containing right lateral ventral hernia related to prior ostomy.Small fat containing ventral hernia. 5. Three-vessel coronary artery disease. Aortic Atherosclerosis.      Today patient was accompanied by his wife to establish care.  + unintentional weight loss, 20 pounds since his last visit 1 year ago. Food does not taste well.  Denies  abdominal pain, sob, nausea vomiting, blood in stool.    MEDICAL HISTORY:  Past Medical History:  Diagnosis Date   Atherosclerosis    Cancer ([place])    Chronic kidney disease    kidney stones, UTI   Colon cancer ([place])    Coronary artery disease    Diabetes mellitus without complication (HCC)    GERD (gastroesophageal reflux disease)    Gout    Hematuria    Hyperlipidemia    Hypertension    Iron deficiency anemia    Myocardial infarction [facility]) 2005   Peripheral vascular disease ([place])    Pulmonary nodules    Rectal cancer (HCC)    Ureter, stricture    Wears dentures    full upper    SURGICAL HISTORY: Past Surgical History:  Procedure Laterality Date   BOWEL RESECTION N/A 10/23/2014   Procedure: LOW ANTERIOR BOWEL RESECTION;  Surgeon: Sherri Rad, MD;  Location: [facility] ORS;  Service: General;  Laterality: N/A;   COLON SURGERY     COLONOSCOPY 06/06/14     COLONOSCOPY WITH ESOPHAGOGASTRODUODENOSCOPY (EGD)     CORONARY ANGIOPLASTY WITH STENT PLACEMENT  03/2004   CYSTOSCOPY WITH STENT PLACEMENT Bilateral 10/23/2014   Procedure: CYSTOSCOPY WITH STENT PLACEMENT,URETHRAL DILATION, LEFT RETROGRADE PYELOGRAM, URETEROSCOPY;  Surgeon: Hollice Espy, MD;  Location: [facility];  Service: Urology;  Laterality: Bilateral;   DIVERTING ILEOSTOMY N/A 10/23/2014   Procedure: DIVERTING ILEOSTOMY;  Surgeon: Sherri Rad, MD;  Location: [facility];  Service: General;  Laterality: N/A;   ILEOSTOMY CLOSURE N/A 09/08/2015   Procedure: ILEOSTOMY TAKEDOWN;  Surgeon: Clayburn Pert, MD;  Location: [facility];  Service: General;  Laterality: N/A;   LAPAROTOMY N/A 10/23/2014   Procedure: EXPLORATORY LAPAROTOMY;  Surgeon: Sherri Rad, MD;  Location: [facility];  Service: General;  Laterality: N/A;   PERIPHERAL VASCULAR CATHETERIZATION N/A 05/12/2015   Procedure: Abdominal Aortogram w/Lower Extremity;  Surgeon: Algernon Huxley, MD;  Location: [place] [facility];  Service: Cardiovascular;  Laterality: N/A;   PERIPHERAL VASCULAR  CATHETERIZATION  05/12/2015   Procedure: Lower Extremity Intervention;  Surgeon: Algernon Huxley, MD;  Location: [place] [facility];  Service: Cardiovascular;;   PERIPHERAL VASCULAR CATHETERIZATION N/A 06/30/2015   Procedure: Abdominal Aortogram w/Lower Extremity;  Surgeon: Algernon Huxley, MD;  Location: [place] [facility];  Service: Cardiovascular;  Laterality: N/A;   PERIPHERAL VASCULAR CATHETERIZATION  06/30/2015   Procedure: Lower Extremity Intervention;  Surgeon: Algernon Huxley, MD;  Location: [place] [facility];  Service: Cardiovascular;;   PORT-A-CATH REMOVAL Right 09/08/2015   Procedure: REMOVAL PORT-A-CATH;  Surgeon: Clayburn Pert, MD;  Location: [facility];  Service: General;  Laterality: Right;   PORTACATH PLACEMENT  07/01/2014   Dr. Marina Gravel    SOCIAL HISTORY: Social History   Socioeconomic History   Marital status: Married    Spouse name: Not on file   Number of children: Not on file   Years of education: Not on file   Highest education level:  Not on file  Occupational History   Not on file  Tobacco Use   Smoking status: Former    Types: Cigarettes    Quit date: 11/19/1984    Years since quitting: 37.2   Smokeless tobacco: Never   Tobacco comments:    smoked for brief period in his teens  Substance and Sexual Activity   Alcohol use: No    Alcohol/week: 0.0 standard drinks of alcohol   Drug use: No   Sexual activity: Not on file  Other Topics Concern   Not on file  Social History Narrative   Not on file   Social Determinants of Health   Financial Resource Strain: Not on file  Food Insecurity: Not on file  Transportation Needs: Not on file  Physical Activity: Not on file  Stress: Not on file  Social Connections: Not on file  Intimate Partner Violence: Not on file    FAMILY HISTORY: Family History  Problem Relation Age of Onset   Hypertension Brother    Hypertension Father    Diabetes Father    Diabetes Brother    Hypertension Brother    Heart disease Paternal  Grandfather     ALLERGIES:  is allergic to no known allergies.  MEDICATIONS:  Current Outpatient Medications  Medication Sig Dispense Refill   acetaminophen (TYLENOL) 500 MG tablet Take 500 mg by mouth every 6 (six) hours as needed.     Ascorbic Acid (VITAMIN C) 1000 MG tablet Take 1,000 mg by mouth daily. Reported on 09/26/2015     colchicine 0.6 MG tablet Take 0.6 mg by mouth daily.     Ferrous Sulfate (IRON) 325 (65 Fe) MG TABS Take 1 tablet by mouth every other day.     furosemide (LASIX) 20 MG tablet Take by mouth.     losartan (COZAAR) 25 MG tablet Take 25 mg by mouth daily.     MIRALAX 17 GM/SCOOP powder SMARTSIG:17 Gram(s) By Mouth 1-2 Times Daily     pravastatin (PRAVACHOL) 20 MG tablet Take 20 mg by mouth daily.     atorvastatin (LIPITOR) 20 MG tablet Take 20 mg by mouth at bedtime. (Patient not taking: Reported on 01/27/2021)     atorvastatin (LIPITOR) 40 MG tablet Take 40 mg by mouth at bedtime. (Patient not taking: Reported on 01/27/2021)     cloNIDine (CATAPRES) 0.2 MG tablet Take 0.2 mg by mouth daily. (Patient not taking: Reported on 02/08/2022)     clopidogrel (PLAVIX) 75 MG tablet Take 75 mg by mouth daily.  (Patient not taking: Reported on 02/08/2022)     hydrochlorothiazide (MICROZIDE) 12.5 MG capsule Take 12.5 mg by mouth daily.  (Patient not taking: Reported on 07/22/2020)     JARDIANCE 10 MG TABS tablet Take 10 mg by mouth daily. (Patient not taking: Reported on 02/08/2022)     lisinopril-hydrochlorothiazide (ZESTORETIC) 10-12.5 MG tablet Take 1 by mouth daily in the morning for high blood pressure. (Patient not taking: Reported on 02/08/2022)     TRADJENTA 5 MG TABS tablet Take 5 mg by mouth daily. (Patient not taking: Reported on 02/08/2022)     No current facility-administered medications for this visit.   Facility-Administered Medications Ordered in Other Visits  Medication Dose Route Frequency Provider Last Rate Last Admin   heparin lock flush 100 unit/mL  500  Units Intravenous Once Corcoran, Melissa C, MD       sodium chloride 0.9 % injection 10 mL  10 mL Intravenous PRN Dallas Schimke, MD  10 mL at 08/26/14 1300   sodium chloride flush (NS) 0.9 % injection 10 mL  10 mL Intravenous PRN Lequita Asal, MD        Review of Systems  Constitutional:  Positive for fatigue and unexpected weight change. Negative for appetite change, chills and fever.  HENT:   Negative for hearing loss and voice change.   Eyes:  Negative for eye problems and icterus.  Respiratory:  Negative for chest tightness, cough and shortness of breath.   Cardiovascular:  Negative for chest pain and leg swelling.  Gastrointestinal:  Negative for abdominal distention and abdominal pain.  Endocrine: Negative for hot flashes.  Genitourinary:  Negative for difficulty urinating, dysuria and frequency.   Musculoskeletal:  Negative for arthralgias.  Skin:  Negative for itching and rash.  Neurological:  Negative for light-headedness and numbness.  Hematological:  Negative for adenopathy. Does not bruise/bleed easily.  Psychiatric/Behavioral:  Negative for confusion.      PHYSICAL EXAMINATION: ECOG PERFORMANCE STATUS: 1 - Symptomatic but completely ambulatory  Vitals:   02/08/22 1458  BP: (!) 118/92  Pulse: 83  Resp: 16  Temp: 97.8 F (36.6 C)  SpO2: 100%   Filed Weights   02/08/22 1458  Weight: 176 lb (79.8 kg)    Physical Exam Constitutional:      General: He is not in acute distress.    Appearance: He is not diaphoretic.     Comments: Patient walks with a cane.   HENT:     Head: Normocephalic and atraumatic.     Nose: Nose normal.     Mouth/Throat:     Pharynx: No oropharyngeal exudate.  Eyes:     General: No scleral icterus.    Pupils: Pupils are equal, round, and reactive to light.  Cardiovascular:     Rate and Rhythm: Normal rate and regular rhythm.     Heart sounds: No murmur heard. Pulmonary:     Effort: Pulmonary effort is normal. No  respiratory distress.     Breath sounds: No rales.  Chest:     Chest wall: No tenderness.  Abdominal:     General: There is no distension.     Palpations: Abdomen is soft.     Tenderness: There is no abdominal tenderness.  Musculoskeletal:        General: Normal range of motion.     Cervical back: Normal range of motion and neck supple.  Skin:    General: Skin is warm and dry.     Findings: No erythema.  Neurological:     Mental Status: He is alert and oriented to person, place, and time.     Cranial Nerves: No cranial nerve deficit.     Motor: No abnormal muscle tone.     Coordination: Coordination normal.  Psychiatric:        Mood and Affect: Mood and affect normal.      LABORATORY DATA:  I have reviewed the data as listed    Latest Ref Rng & Units 02/08/2022    2:27 PM 01/27/2021   11:02 AM 07/22/2020    9:01 AM  CBC  WBC 4.0 - 10.5 K/uL 6.2  5.8  5.1   Hemoglobin 13.0 - 17.0 g/dL 11.6  13.2  13.4   Hematocrit 39.0 - 52.0 % 35.0  42.3  41.5   Platelets 150 - 400 K/uL 245  200  221       Latest Ref Rng & Units 02/08/2022    2:27 PM  01/27/2021   11:02 AM 08/25/2020   10:24 AM  CMP  Glucose 70 - 99 mg/dL 121  199    BUN 8 - 23 mg/dL 21  27    Creatinine 0.61 - 1.24 mg/dL 1.35  1.41    Sodium 135 - 145 mmol/L 137  133    Potassium 3.5 - 5.1 mmol/L 3.7  4.0    Chloride 98 - 111 mmol/L 102  94    CO2 22 - 32 mmol/L 24  28    Calcium 8.9 - 10.3 mg/dL 9.3  9.4    Total Protein 6.5 - 8.1 g/dL 7.0  6.9  6.9   Total Bilirubin 0.3 - 1.2 mg/dL 1.4  0.5  0.7   Alkaline Phos 38 - 126 U/L 148  140  94   AST 15 - 41 U/L 23  37  37   ALT 0 - 44 U/L 13  54  59      RADIOGRAPHIC STUDIES: I have personally reviewed the radiological images as listed and agreed with the findings in the report. No results found.

## 2022-02-08 NOTE — Assessment & Plan Note (Signed)
Refer to GI Continue oral iron supplementation 3 time per week.

## 2022-02-09 ENCOUNTER — Telehealth: Payer: Self-pay

## 2022-02-09 LAB — CEA: CEA: 8.8 ng/mL — ABNORMAL HIGH (ref 0.0–4.7)

## 2022-02-09 NOTE — Telephone Encounter (Signed)
-----   Message from Earlie Server, MD sent at 02/08/2022  8:12 PM EDT ----- Please let patient/wife know that his iron level is slightly decreased. I recommend him to continue iron supplementation 3 times per week  Thanks.

## 2022-02-09 NOTE — Telephone Encounter (Signed)
No answer on phone. Detailed VM with recommendation left and pt's cell phone.

## 2022-02-19 ENCOUNTER — Ambulatory Visit
Admission: RE | Admit: 2022-02-19 | Discharge: 2022-02-19 | Disposition: A | Discharge status: Home / Self Care | Payer: Medicare HMO | Source: Ambulatory Visit | Attending: Oncology | Admitting: Oncology

## 2022-02-19 DIAGNOSIS — C20 Malignant neoplasm of rectum: Secondary | ICD-10-CM | POA: Diagnosis present

## 2022-02-24 ENCOUNTER — Telehealth: Payer: Self-pay

## 2022-02-24 NOTE — Telephone Encounter (Signed)
Per Dr. Tasia Catchings Dr.Vanga, good morning. I referred this patient to establish with you. she has history of rectal cancer treatment 5-6 years ago, treated by Dr. Mike Gip. recent CEA elevated and weight loss. non contrast CT showed no cancer recurrence. are you able to see her earlier than Feb 2024 for repeating colonoscopy   Per Dr. Marius Ditch: Definitely, [name] [name], I can see this patient sooner, okay to [facility] patient and left a message for call back to move up his appointment

## 2022-02-24 NOTE — Telephone Encounter (Signed)
Patient wife called back and she is on DPR and reschedule appointment to 03/10/2022

## 2022-03-04 ENCOUNTER — Other Ambulatory Visit: Payer: Self-pay

## 2022-03-10 ENCOUNTER — Encounter: Payer: Self-pay | Admitting: Gastroenterology

## 2022-03-10 ENCOUNTER — Ambulatory Visit: Payer: Medicare HMO | Admitting: Gastroenterology

## 2022-03-10 ENCOUNTER — Other Ambulatory Visit: Payer: Self-pay

## 2022-03-10 VITALS — BP 122/84 | HR 89 | Temp 98.4°F | Ht 66.0 in | Wt 190.0 lb

## 2022-03-10 DIAGNOSIS — Z85048 Personal history of other malignant neoplasm of rectum, rectosigmoid junction, and anus: Secondary | ICD-10-CM | POA: Diagnosis not present

## 2022-03-10 MED ORDER — CLENPIQ 10-3.5-12 MG-GM -GM/175ML PO SOLN: 175.0000 mL | Freq: Once | ORAL | 0 refills | Status: AC

## 2022-03-10 MED ORDER — POLYETHYLENE GLYCOL 3350 17 GM/SCOOP PO POWD: 1.0000 | Freq: Every day | ORAL | 0 refills | Status: DC

## 2022-03-10 NOTE — Progress Notes (Signed)
[name] Darby, MD [address]  [place]  [place], [place] [address]  Main: [phone]  Fax: [phone]    Gastroenterology Consultation  Referring Provider:     [facility] [place] Physician:  [name] Pion, FNP Primary Gastroenterologist:  Dr. Cephas [name] Reason for Consultation: History of rectal cancer        HPI:   [name] [name] [name] is a 66 y.o. male referred by [name] Pion, FNP  for consultation & management of recent weight loss and elevated CEA levels.  Patient had history of rectal cancer diagnosed in 08/2014, s/p radiation and chemo, LAR with diverting loop ileostomy followed by ileostomy takedown on 09/08/2015.  Patient is found to have recent weight loss of 10 pounds within last 1 year as well as elevated CEA to 8.8 about a month ago when he saw Dr. Tasia [name] for cancer surveillance follow-up.  He does have mild iron deficiency anemia.  Patient denies any rectal bleeding, straining, pelvic pressure or change in bowel habits.  He reports having 3 soft bowel movements daily.  He denies any abdominal pain or bloating.  He reports that his appetite has slightly improved and he is eating more.  NSAIDs: None  Antiplts/Anticoagulants/Anti thrombotics: None  GI Procedures:   06/06/2014 colonoscopy revealed a large non-circumferential distal rectal mass. Biopsy was positive for adenocarcinoma.  EUS 70% circumferential mass at 9-14 cm from the anal verge and measuring 9 mm in maximal thickness. Ultrasound suggested breakthrough of the muscularis propria with invasion into the perirectal fat.  Past Medical History:  Diagnosis Date   Atherosclerosis    Cancer ([place])    Chronic kidney disease    kidney stones, UTI   Colon cancer ([place])    Coronary artery disease    Diabetes mellitus without complication (HCC)    GERD (gastroesophageal reflux disease)    Gout    Hematuria    Hyperlipidemia    Hypertension    Iron deficiency anemia     Myocardial infarction [facility]) 2005   Peripheral vascular disease (HCC)    Pulmonary nodules    Rectal cancer (HCC)    Ureter, stricture    Wears dentures    full upper    Past Surgical History:  Procedure Laterality Date   BOWEL RESECTION N/A 10/23/2014   Procedure: LOW ANTERIOR BOWEL RESECTION;  Surgeon: Sherri Rad, MD;  Location: [facility];  Service: General;  Laterality: N/A;   COLON SURGERY     COLONOSCOPY 06/06/14     COLONOSCOPY WITH ESOPHAGOGASTRODUODENOSCOPY (EGD)     CORONARY ANGIOPLASTY WITH STENT PLACEMENT  03/2004   CYSTOSCOPY WITH STENT PLACEMENT Bilateral 10/23/2014   Procedure: CYSTOSCOPY WITH STENT PLACEMENT,URETHRAL DILATION, LEFT RETROGRADE PYELOGRAM, URETEROSCOPY;  Surgeon: Hollice Espy, MD;  Location: [facility];  Service: Urology;  Laterality: Bilateral;   DIVERTING ILEOSTOMY N/A 10/23/2014   Procedure: DIVERTING ILEOSTOMY;  Surgeon: Sherri Rad, MD;  Location: [facility];  Service: General;  Laterality: N/A;   ILEOSTOMY CLOSURE N/A 09/08/2015   Procedure: ILEOSTOMY TAKEDOWN;  Surgeon: Clayburn Pert, MD;  Location: [facility];  Service: General;  Laterality: N/A;   LAPAROTOMY N/A 10/23/2014   Procedure: EXPLORATORY LAPAROTOMY;  Surgeon: Sherri Rad, MD;  Location: [facility];  Service: General;  Laterality: N/A;   PERIPHERAL VASCULAR CATHETERIZATION N/A 05/12/2015   Procedure: Abdominal Aortogram w/Lower Extremity;  Surgeon: Algernon Huxley, MD;  Location: [place] [facility];  Service: Cardiovascular;  Laterality: N/A;   PERIPHERAL VASCULAR  CATHETERIZATION  05/12/2015   Procedure: Lower Extremity Intervention;  Surgeon: Algernon Huxley, MD;  Location: [place] [facility];  Service: Cardiovascular;;   PERIPHERAL VASCULAR CATHETERIZATION N/A 06/30/2015   Procedure: Abdominal Aortogram w/Lower Extremity;  Surgeon: Algernon Huxley, MD;  Location: [place] [facility];  Service: Cardiovascular;  Laterality: N/A;   PERIPHERAL VASCULAR CATHETERIZATION  06/30/2015   Procedure: Lower Extremity Intervention;   Surgeon: Algernon Huxley, MD;  Location: [place] [facility];  Service: Cardiovascular;;   PORT-A-CATH REMOVAL Right 09/08/2015   Procedure: REMOVAL PORT-A-CATH;  Surgeon: Clayburn Pert, MD;  Location: [facility];  Service: General;  Laterality: Right;   PORTACATH PLACEMENT  07/01/2014   Dr. Marina Gravel     Current Outpatient Medications:    acetaminophen (TYLENOL) 500 MG tablet, Take 500 mg by mouth every 6 (six) hours as needed., Disp: , Rfl:    Ascorbic Acid (VITAMIN C) 1000 MG tablet, Take 1,000 mg by mouth daily. Reported on 09/26/2015, Disp: , Rfl:    atorvastatin (LIPITOR) 20 MG tablet, Take 20 mg by mouth at bedtime., Disp: , Rfl:    bumetanide (BUMEX) 0.5 MG tablet, Take 0.5 mg by mouth daily., Disp: , Rfl:    colchicine 0.6 MG tablet, Take 0.6 mg by mouth daily., Disp: , Rfl:    Ferrous Sulfate (IRON) 325 (65 Fe) MG TABS, Take 1 tablet by mouth every other day., Disp: , Rfl:    furosemide (LASIX) 20 MG tablet, Take by mouth., Disp: , Rfl:    MIRALAX 17 GM/SCOOP powder, SMARTSIG:17 Gram(s) By Mouth 1-2 Times Daily, Disp: , Rfl:    polyethylene glycol powder (GLYCOLAX/MIRALAX) 17 GM/SCOOP powder, Take 255 g by mouth daily., Disp: 238 g, Rfl: 0   Sod Picosulfate-Mag Ox-Cit Acd (CLENPIQ) 10-3.5-12 MG-GM -GM/175ML SOLN, Take 175 mLs by mouth once for 1 dose., Disp: 350 mL, Rfl: 0 No current facility-administered medications for this visit.  Facility-Administered Medications Ordered in Other Visits:    heparin lock flush 100 unit/mL, 500 Units, Intravenous, Once, Corcoran, Melissa C, MD   sodium chloride 0.9 % injection 10 mL, 10 mL, Intravenous, PRN, Dallas Schimke, MD, 10 mL at 08/26/14 1300   sodium chloride flush (NS) 0.9 % injection 10 mL, 10 mL, Intravenous, PRN, Mike Gip, Drue Second, MD   Family History  Problem Relation Age of Onset   Hypertension Brother    Hypertension Father    Diabetes Father    Diabetes Brother    Hypertension Brother    Heart disease Paternal Grandfather       Social History   Tobacco Use   Smoking status: Former    Types: Cigarettes    Quit date: 11/19/1984    Years since quitting: 37.3   Smokeless tobacco: Never   Tobacco comments:    smoked for brief period in his teens  Substance Use Topics   Alcohol use: No    Alcohol/week: 0.0 standard drinks of alcohol   Drug use: No    Allergies as of 03/10/2022 - Review Complete 03/10/2022  Allergen Reaction Noted   No known allergies  07/26/2014    Review of Systems:    All systems reviewed and negative except where noted in HPI.   Physical Exam:  BP 122/84 (BP Location: Left Arm, Patient Position: Sitting, Cuff Size: Normal)   Pulse 89   Temp 98.4 F (36.9 C) (Oral)   Ht '5\' 6"'$  (1.676 m)   Wt 190 lb (86.2 kg)   BMI 30.67 kg/m  No LMP for male patient.  General:   Alert,  Well-developed, well-nourished, pleasant and cooperative in NAD Head:  Normocephalic and atraumatic. Eyes:  Sclera clear, no icterus.   Conjunctiva pink. Ears:  Normal auditory acuity. Nose:  No deformity, discharge, or lesions. Mouth:  No deformity or lesions,oropharynx pink & moist. Neck:  Supple; no masses or thyromegaly. Lungs:  Respirations even and unlabored.  Clear throughout to auscultation.   No wheezes, crackles, or rhonchi. No acute distress. Heart:  Regular rate and rhythm; no murmurs, clicks, rubs, or gallops. Abdomen:  Normal bowel sounds. Soft, non-tender and non-distended without masses, hepatosplenomegaly or hernias noted.  No guarding or rebound tenderness.   Rectal: Digital rectal exam showed soft brown stool and no palpable lesion in the distal rectum Msk:  Symmetrical without gross deformities. Good, equal movement & strength bilaterally. Pulses:  Normal pulses noted. Extremities:  No clubbing or edema.  No cyanosis. Neurologic:  Alert and oriented x3;  grossly normal neurologically. Skin:  Intact without significant lesions or rashes. No jaundice. Psych:  Alert and cooperative. Normal  mood and affect.  Imaging Studies: Reviewed  Assessment and Plan:   JANATHAN BRIBIESCA [name] is a 66 y.o. male with history of rectal cancer, s/p chemo XRT, LAR in 2016 with diabetic loop ileostomy, ileostomy takedown in 2017 presents with elevated CEA levels and weight loss.  Discussed with patient regarding colonoscopy and he prefers to undergo after Christmas because he does not want to know anything about possible recurrence of cancer  Schedule colonoscopy in early January  Follow up based on the colonoscopy findings   [name] Darby, MD

## 2022-03-15 ENCOUNTER — Telehealth: Payer: Self-pay

## 2022-03-15 NOTE — Telephone Encounter (Signed)
-----   Message from Earlie Server, MD sent at 03/12/2022  9:37 PM EST ----- She has colonoscopy in early Jan. Please arrange MD follow up in [facility] January [at least 1 week after colonoscopy]. Thanks.

## 2022-03-18 ENCOUNTER — Telehealth: Payer: Self-pay

## 2022-03-18 NOTE — Telephone Encounter (Signed)
Patient wife called back and left a voicemail. Returned patient wife call and left a message for call back

## 2022-03-18 NOTE — Telephone Encounter (Signed)
Called and left a message for call back  

## 2022-03-18 NOTE — Telephone Encounter (Signed)
Sent mychart message

## 2022-03-18 NOTE — Telephone Encounter (Signed)
Receive blood thinner clearance for patient to stop Eliquis 5 days before procedure and restart it 2 days after procedure. I do not have that patient is taking the Eliquis. Called and left a message for call back

## 2022-03-19 NOTE — Telephone Encounter (Signed)
Patient's wife called stated that patient does not take Eliquis. They told Dr Clayborn Bigness on Monday 03/15/2022 that patient stop years ago.

## 2022-03-25 ENCOUNTER — Telehealth: Payer: Self-pay

## 2022-03-25 NOTE — Telephone Encounter (Signed)
Patient is calling wanting to reschedule colonoscopy that is schedule for 04/22/2022. He states he wants to move it to the end of January. Reschedule to 05/14/2022. Called and informed Wannetta Sender

## 2022-03-25 NOTE — Telephone Encounter (Signed)
Mailed out new instructions to patient

## 2022-05-05 ENCOUNTER — Emergency Department: Payer: Medicare HMO

## 2022-05-05 ENCOUNTER — Inpatient Hospital Stay
Admission: EM | Admit: 2022-05-05 | Discharge: 2022-05-13 | DRG: 291 | Disposition: A | Discharge status: Home / Self Care | Payer: Medicare HMO | Attending: Internal Medicine | Admitting: Internal Medicine

## 2022-05-05 ENCOUNTER — Encounter: Payer: Self-pay | Admitting: Emergency Medicine

## 2022-05-05 ENCOUNTER — Other Ambulatory Visit: Payer: Self-pay

## 2022-05-05 ENCOUNTER — Inpatient Hospital Stay: Payer: Medicare HMO | Admitting: Oncology

## 2022-05-05 ENCOUNTER — Telehealth: Payer: Self-pay | Admitting: Oncology

## 2022-05-05 DIAGNOSIS — D649 Anemia, unspecified: Secondary | ICD-10-CM | POA: Insufficient documentation

## 2022-05-05 DIAGNOSIS — T502X5A Adverse effect of carbonic-anhydrase inhibitors, benzothiadiazides and other diuretics, initial encounter: Secondary | ICD-10-CM | POA: Diagnosis present

## 2022-05-05 DIAGNOSIS — Z8249 Family history of ischemic heart disease and other diseases of the circulatory system: Secondary | ICD-10-CM | POA: Diagnosis not present

## 2022-05-05 DIAGNOSIS — Z923 Personal history of irradiation: Secondary | ICD-10-CM

## 2022-05-05 DIAGNOSIS — Z79899 Other long term (current) drug therapy: Secondary | ICD-10-CM

## 2022-05-05 DIAGNOSIS — I5023 Acute on chronic systolic (congestive) heart failure: Secondary | ICD-10-CM | POA: Diagnosis present

## 2022-05-05 DIAGNOSIS — I429 Cardiomyopathy, unspecified: Secondary | ICD-10-CM | POA: Diagnosis present

## 2022-05-05 DIAGNOSIS — E785 Hyperlipidemia, unspecified: Secondary | ICD-10-CM | POA: Diagnosis present

## 2022-05-05 DIAGNOSIS — E876 Hypokalemia: Secondary | ICD-10-CM | POA: Diagnosis present

## 2022-05-05 DIAGNOSIS — I252 Old myocardial infarction: Secondary | ICD-10-CM | POA: Diagnosis not present

## 2022-05-05 DIAGNOSIS — I509 Heart failure, unspecified: Secondary | ICD-10-CM

## 2022-05-05 DIAGNOSIS — E669 Obesity, unspecified: Secondary | ICD-10-CM | POA: Diagnosis present

## 2022-05-05 DIAGNOSIS — E1151 Type 2 diabetes mellitus with diabetic peripheral angiopathy without gangrene: Secondary | ICD-10-CM | POA: Diagnosis present

## 2022-05-05 DIAGNOSIS — J9601 Acute respiratory failure with hypoxia: Principal | ICD-10-CM

## 2022-05-05 DIAGNOSIS — E119 Type 2 diabetes mellitus without complications: Secondary | ICD-10-CM

## 2022-05-05 DIAGNOSIS — I130 Hypertensive heart and chronic kidney disease with heart failure and stage 1 through stage 4 chronic kidney disease, or unspecified chronic kidney disease: Secondary | ICD-10-CM | POA: Diagnosis present

## 2022-05-05 DIAGNOSIS — Z955 Presence of coronary angioplasty implant and graft: Secondary | ICD-10-CM

## 2022-05-05 DIAGNOSIS — E1122 Type 2 diabetes mellitus with diabetic chronic kidney disease: Secondary | ICD-10-CM | POA: Diagnosis present

## 2022-05-05 DIAGNOSIS — R601 Generalized edema: Secondary | ICD-10-CM

## 2022-05-05 DIAGNOSIS — Z833 Family history of diabetes mellitus: Secondary | ICD-10-CM | POA: Diagnosis not present

## 2022-05-05 DIAGNOSIS — N1831 Chronic kidney disease, stage 3a: Secondary | ICD-10-CM

## 2022-05-05 DIAGNOSIS — M109 Gout, unspecified: Secondary | ICD-10-CM | POA: Diagnosis present

## 2022-05-05 DIAGNOSIS — E11649 Type 2 diabetes mellitus with hypoglycemia without coma: Secondary | ICD-10-CM | POA: Diagnosis present

## 2022-05-05 DIAGNOSIS — Z85048 Personal history of other malignant neoplasm of rectum, rectosigmoid junction, and anus: Secondary | ICD-10-CM | POA: Diagnosis not present

## 2022-05-05 DIAGNOSIS — I959 Hypotension, unspecified: Secondary | ICD-10-CM | POA: Diagnosis present

## 2022-05-05 DIAGNOSIS — I10 Essential (primary) hypertension: Secondary | ICD-10-CM | POA: Diagnosis present

## 2022-05-05 DIAGNOSIS — I2510 Atherosclerotic heart disease of native coronary artery without angina pectoris: Secondary | ICD-10-CM | POA: Diagnosis present

## 2022-05-05 DIAGNOSIS — K219 Gastro-esophageal reflux disease without esophagitis: Secondary | ICD-10-CM | POA: Diagnosis present

## 2022-05-05 DIAGNOSIS — Z87891 Personal history of nicotine dependence: Secondary | ICD-10-CM | POA: Diagnosis not present

## 2022-05-05 DIAGNOSIS — Z9221 Personal history of antineoplastic chemotherapy: Secondary | ICD-10-CM

## 2022-05-05 DIAGNOSIS — I878 Other specified disorders of veins: Secondary | ICD-10-CM | POA: Diagnosis present

## 2022-05-05 DIAGNOSIS — U071 COVID-19: Secondary | ICD-10-CM | POA: Diagnosis present

## 2022-05-05 DIAGNOSIS — R7989 Other specified abnormal findings of blood chemistry: Secondary | ICD-10-CM | POA: Diagnosis present

## 2022-05-05 LAB — URINALYSIS, ROUTINE W REFLEX MICROSCOPIC
Bilirubin Urine: NEGATIVE
Glucose, UA: NEGATIVE mg/dL
Hgb urine dipstick: NEGATIVE
Ketones, ur: NEGATIVE mg/dL
Leukocytes,Ua: NEGATIVE
Nitrite: NEGATIVE
Protein, ur: 30 mg/dL — AB
Specific Gravity, Urine: 1.006 (ref 1.005–1.030)
pH: 5 (ref 5.0–8.0)

## 2022-05-05 LAB — COMPREHENSIVE METABOLIC PANEL
ALT: 21 U/L (ref 0–44)
AST: 28 U/L (ref 15–41)
Albumin: 4 g/dL (ref 3.5–5.0)
Alkaline Phosphatase: 243 U/L — ABNORMAL HIGH (ref 38–126)
Anion gap: 13 (ref 5–15)
BUN: 21 mg/dL (ref 8–23)
CO2: 21 mmol/L — ABNORMAL LOW (ref 22–32)
Calcium: 9.2 mg/dL (ref 8.9–10.3)
Chloride: 107 mmol/L (ref 98–111)
Creatinine, Ser: 1.32 mg/dL — ABNORMAL HIGH (ref 0.61–1.24)
GFR, Estimated: 59 mL/min — ABNORMAL LOW (ref >60)
Glucose, Bld: 80 mg/dL (ref 70–99)
Potassium: 4 mmol/L (ref 3.5–5.1)
Sodium: 141 mmol/L (ref 135–145)
Total Bilirubin: 1.1 mg/dL (ref 0.3–1.2)
Total Protein: 6.6 g/dL (ref 6.5–8.1)

## 2022-05-05 LAB — CBC WITH DIFFERENTIAL/PLATELET
Abs Immature Granulocytes: 0.03 10*3/uL (ref 0.00–0.07)
Basophils Absolute: 0 10*3/uL (ref 0.0–0.1)
Basophils Relative: 0 %
Eosinophils Absolute: 0.1 10*3/uL (ref 0.0–0.5)
Eosinophils Relative: 2 %
HCT: 37.6 % — ABNORMAL LOW (ref 39.0–52.0)
Hemoglobin: 11.7 g/dL — ABNORMAL LOW (ref 13.0–17.0)
Immature Granulocytes: 0 %
Lymphocytes Relative: 9 %
Lymphs Abs: 0.6 10*3/uL — ABNORMAL LOW (ref 0.7–4.0)
MCH: 26.5 pg (ref 26.0–34.0)
MCHC: 31.1 g/dL (ref 30.0–36.0)
MCV: 85.1 fL (ref 80.0–100.0)
Monocytes Absolute: 0.6 10*3/uL (ref 0.1–1.0)
Monocytes Relative: 8 %
Neutro Abs: 5.4 10*3/uL (ref 1.7–7.7)
Neutrophils Relative %: 81 %
Platelets: 227 10*3/uL (ref 150–400)
RBC: 4.42 MIL/uL (ref 4.22–5.81)
RDW: 17.4 % — ABNORMAL HIGH (ref 11.5–15.5)
WBC: 6.8 10*3/uL (ref 4.0–10.5)
nRBC: 0 % (ref 0.0–0.2)

## 2022-05-05 LAB — CBG MONITORING, ED
Glucose-Capillary: 65 mg/dL — ABNORMAL LOW (ref 70–99)
Glucose-Capillary: 73 mg/dL (ref 70–99)

## 2022-05-05 LAB — TROPONIN I (HIGH SENSITIVITY)
Troponin I (High Sensitivity): 20 ng/L — ABNORMAL HIGH (ref <18)
Troponin I (High Sensitivity): 21 ng/L — ABNORMAL HIGH (ref <18)

## 2022-05-05 LAB — TSH: TSH: 3.965 u[IU]/mL (ref 0.350–4.500)

## 2022-05-05 LAB — BRAIN NATRIURETIC PEPTIDE: B Natriuretic Peptide: 3983.4 pg/mL — ABNORMAL HIGH (ref 0.0–100.0)

## 2022-05-05 MED ORDER — ALBUTEROL SULFATE (2.5 MG/3ML) 0.083% IN NEBU: 2.5000 mg | INHALATION_SOLUTION | RESPIRATORY_TRACT | Status: DC | PRN

## 2022-05-05 MED ORDER — ACETAMINOPHEN 325 MG PO TABS
650.0000 mg | ORAL_TABLET | Freq: Four times a day (QID) | ORAL | Status: DC | PRN
  Administered 2022-05-12 – 2022-05-13 (×3): 650 mg via ORAL
  Filled 2022-05-05 (×3): qty 2

## 2022-05-05 MED ORDER — ACETAMINOPHEN 650 MG RE SUPP: 650.0000 mg | Freq: Four times a day (QID) | RECTAL | Status: DC | PRN

## 2022-05-05 MED ORDER — SACUBITRIL-VALSARTAN 24-26 MG PO TABS
0.5000 | ORAL_TABLET | Freq: Two times a day (BID) | ORAL | Status: DC
  Administered 2022-05-05: 0.5 via ORAL
  Filled 2022-05-05 (×2): qty 0.5

## 2022-05-05 MED ORDER — INSULIN ASPART 100 UNIT/ML IJ SOLN: 0.0000 [IU] | Freq: Every day | INTRAMUSCULAR | Status: DC

## 2022-05-05 MED ORDER — ATORVASTATIN CALCIUM 20 MG PO TABS
20.0000 mg | ORAL_TABLET | Freq: Every day | ORAL | Status: DC
  Administered 2022-05-05: 20 mg via ORAL
  Filled 2022-05-05: qty 1

## 2022-05-05 MED ORDER — FERROUS SULFATE 325 (65 FE) MG PO TABS
325.0000 mg | ORAL_TABLET | ORAL | Status: DC
  Administered 2022-05-07 – 2022-05-13 (×4): 325 mg via ORAL
  Filled 2022-05-05 (×5): qty 1

## 2022-05-05 MED ORDER — FUROSEMIDE 10 MG/ML IJ SOLN
40.0000 mg | Freq: Two times a day (BID) | INTRAMUSCULAR | Status: DC
  Filled 2022-05-05: qty 4

## 2022-05-05 MED ORDER — ONDANSETRON HCL 4 MG PO TABS: 4.0000 mg | ORAL_TABLET | Freq: Four times a day (QID) | ORAL | Status: DC | PRN

## 2022-05-05 MED ORDER — ENOXAPARIN SODIUM 40 MG/0.4ML IJ SOSY
40.0000 mg | PREFILLED_SYRINGE | INTRAMUSCULAR | Status: DC
  Administered 2022-05-05 – 2022-05-12 (×8): 40 mg via SUBCUTANEOUS
  Filled 2022-05-05 (×8): qty 0.4

## 2022-05-05 MED ORDER — FUROSEMIDE 10 MG/ML IJ SOLN
40.0000 mg | Freq: Once | INTRAMUSCULAR | Status: AC
  Administered 2022-05-05: 40 mg via INTRAVENOUS
  Filled 2022-05-05: qty 4

## 2022-05-05 MED ORDER — CARVEDILOL 3.125 MG PO TABS
3.1250 mg | ORAL_TABLET | Freq: Two times a day (BID) | ORAL | Status: DC
  Administered 2022-05-06 – 2022-05-09 (×6): 3.125 mg via ORAL
  Filled 2022-05-05 (×9): qty 1

## 2022-05-05 MED ORDER — ONDANSETRON HCL 4 MG/2ML IJ SOLN: 4.0000 mg | Freq: Four times a day (QID) | INTRAMUSCULAR | Status: DC | PRN

## 2022-05-05 MED ORDER — INSULIN ASPART 100 UNIT/ML IJ SOLN: 0.0000 [IU] | Freq: Three times a day (TID) | INTRAMUSCULAR | Status: DC

## 2022-05-05 NOTE — Assessment & Plan Note (Signed)
Cardiomyopathy, EF 20% 02/2022 Continue IV Lasix Continue carvedilol, Entresto Daily weights with intake and output monitoring Cardiology consult for additional recommendations in view of recent low EF

## 2022-05-05 NOTE — Assessment & Plan Note (Signed)
Diet controlled Get A1c Sliding scale insulin coverage while hospitalized

## 2022-05-05 NOTE — ED Notes (Signed)
Snack and juice provided to patient.

## 2022-05-05 NOTE — ED Provider Notes (Signed)
[facility] Provider Note    Event Date/Time   First MD Initiated Contact with Patient 05/05/22 1721     (approximate)   History   Chief Complaint: Shortness of Breath   HPI  [name] ANTONSON [name] is a 67 y.o. male with a history of CKD, colon cancer, hypertension, diabetes who comes ED complaining of shortness of breath, gradually worsening over the past month with dyspnea on exertion and orthopnea.  Notes that he is currently only able to walk about 10 feet before he has to stop due to being out of breath.  Also reports a 35 pound weight gain over this timeframe.  He has been taking medications including Bumex as prescribed.  Denies chest pain.  No fever     Physical Exam   Triage Vital Signs: ED Triage Vitals  Enc Vitals Group     BP 05/05/22 1716 (!) 134/99     Pulse Rate 05/05/22 1716 69     Resp 05/05/22 1716 18     Temp 05/05/22 1716 97.6 F (36.4 C)     Temp Source 05/05/22 1716 Oral     SpO2 05/05/22 1716 99 %     Weight --      Height --      Head Circumference --      Peak Flow --      Pain Score 05/05/22 1714 0     Pain Loc --      Pain Edu? --      Excl. in [place]? --     Most recent vital signs: Vitals:   05/05/22 1900 05/05/22 2002  BP: (!) 132/94 (!) 135/99  Pulse: 75 72  Resp: 19 18  Temp:  97.6 F (36.4 C)  SpO2: 96% 100%    General: Awake, no distress.  CV:  Good peripheral perfusion.  Regular rate and rhythm. Resp:  Normal effort.  Clear to auscultation bilaterally Abd:  Mild distention.  Soft with diffuse abdominal wall edema. Other:  1+ pitting edema bilateral lower extremities.  Symmetric calf circumference.   ED Results / Procedures / Treatments   Labs (all labs ordered are listed, but only abnormal results are displayed) Labs Reviewed  COMPREHENSIVE METABOLIC PANEL - Abnormal; Notable for the following components:      Result Value   CO2 21 (*)    Creatinine, Ser 1.32 (*)    Alkaline Phosphatase 243 (*)     GFR, Estimated 59 (*)    All other components within normal limits  CBC WITH DIFFERENTIAL/PLATELET - Abnormal; Notable for the following components:   Hemoglobin 11.7 (*)    HCT 37.6 (*)    RDW 17.4 (*)    Lymphs Abs 0.6 (*)    All other components within normal limits  BRAIN NATRIURETIC PEPTIDE - Abnormal; Notable for the following components:   B Natriuretic Peptide 3,983.4 (*)    All other components within normal limits  URINALYSIS, ROUTINE W REFLEX MICROSCOPIC - Abnormal; Notable for the following components:   Color, Urine STRAW (*)    APPearance CLEAR (*)    Protein, ur 30 (*)    Bacteria, UA RARE (*)    All other components within normal limits  TROPONIN I (HIGH SENSITIVITY) - Abnormal; Notable for the following components:   Troponin I (High Sensitivity) 20 (*)    All other components within normal limits  TROPONIN I (HIGH SENSITIVITY) - Abnormal; Notable for the following components:   Troponin I (High  Sensitivity) 21 (*)    All other components within normal limits  TSH  HIV ANTIBODY (ROUTINE TESTING W REFLEX)  HEMOGLOBIN A1C  CBC  BASIC METABOLIC PANEL     EKG Interpreted by me Normal sinus rhythm rate of 69.  Right axis, poor R wave progression.  Normal ST segments and T waves.  Normal intervals.  No ischemic changes.   RADIOLOGY Chest x-ray interpreted by me, appears unremarkable.  Radiology report reviewed   PROCEDURES:  Procedures   MEDICATIONS ORDERED IN ED: Medications  atorvastatin (LIPITOR) tablet 20 mg (has no administration in time range)  carvedilol (COREG) tablet 3.125 mg (has no administration in time range)  sacubitril-valsartan (ENTRESTO) 24-26 mg per tablet (has no administration in time range)  ferrous sulfate tablet 325 mg (has no administration in time range)  enoxaparin (LOVENOX) injection 40 mg (has no administration in time range)  acetaminophen (TYLENOL) tablet 650 mg (has no administration in time range)    Or   acetaminophen (TYLENOL) suppository 650 mg (has no administration in time range)  ondansetron (ZOFRAN) tablet 4 mg (has no administration in time range)    Or  ondansetron (ZOFRAN) injection 4 mg (has no administration in time range)  furosemide (LASIX) injection 40 mg (40 mg Intravenous Not Given 05/05/22 2046)  insulin aspart (novoLOG) injection 0-9 Units (has no administration in time range)  insulin aspart (novoLOG) injection 0-5 Units (has no administration in time range)  albuterol (PROVENTIL) (2.5 MG/3ML) 0.083% nebulizer solution 2.5 mg (has no administration in time range)  furosemide (LASIX) injection 40 mg (40 mg Intravenous Given 05/05/22 1825)     IMPRESSION / MDM / ASSESSMENT AND PLAN / ED COURSE  I reviewed the triage vital signs and the nursing notes.                              Differential diagnosis includes, but is not limited to, non-STEMI, pleural effusion, pulmonary edema, CHF exacerbation, hypoalbuminemia, hypothyroidism, electrolyte abnormality  Patient's presentation is most consistent with acute presentation with potential threat to life or bodily function.  Patient presents with anasarca with lower extremity edema and abdominal wall edema.  Also may have some free ascites.  Vital signs unremarkable, no specific pain or tenderness, no signs of infection.  Will give IV Lasix to initiate diuresis.  Due to the severity of his symptoms, will need to plan on hospitalization.   Clinical Course as of 05/05/22 2218  Wed May 05, 2022  1818 Pt SO2 dropped to 87% on RA, normalized on 2L [place]. Will need to admit for CHF and hypoxic resp failure.  [PS]    Clinical Course User Index [PS] Carrie Mew, MD     FINAL CLINICAL IMPRESSION(S) / ED DIAGNOSES   Final diagnoses:  Acute respiratory failure with hypoxia ([place])  Anasarca  Acute on chronic congestive heart failure, unspecified heart failure type ([place])     Rx / DC Orders   ED Discharge Orders     None         Note:  This document was prepared using Dragon voice recognition software and may include unintentional dictation errors.   Carrie Mew, MD 05/05/22 2218

## 2022-05-05 NOTE — Telephone Encounter (Signed)
spoke with pt to notify of req to have appt r/s per [place]. Pt confimed new appt.

## 2022-05-05 NOTE — Assessment & Plan Note (Addendum)
Stable at 11-12

## 2022-05-05 NOTE — Assessment & Plan Note (Deleted)
CEA is pending.  Repeat colonoscopy

## 2022-05-05 NOTE — Assessment & Plan Note (Signed)
Renal function at baseline 

## 2022-05-05 NOTE — ED Notes (Signed)
Pt' Oxygen saturation drooped down into the mid 80's, pt placed on 2L nasal cannula with improvement to 100%

## 2022-05-05 NOTE — Assessment & Plan Note (Deleted)
Check TSH Repeat CT chest abdomen pelvis wo contrast

## 2022-05-05 NOTE — ED Triage Notes (Signed)
Patient sent to ED by PCP for SOB and a 35 lb weight gain over the past month. Patient states he has not been dx with CHF but takes a fluid pill every other day.

## 2022-05-05 NOTE — Assessment & Plan Note (Deleted)
  Continue oral iron supplementation 3 time per week.

## 2022-05-05 NOTE — Assessment & Plan Note (Signed)
BP controlled Continue Entresto and carvedilol

## 2022-05-05 NOTE — Assessment & Plan Note (Addendum)
Rectal adenocarcinoma diagnosed in 08/2014, s/p radiation and chemo, LAR with diverting loop ileostomy followed by ileostomy takedown on 09/08/2015  Seen by GI 02/2022

## 2022-05-05 NOTE — Assessment & Plan Note (Signed)
Elevated alk phos to the mid 200s, baseline mid 208Y etiology uncertain Outpatient follow-up

## 2022-05-05 NOTE — Assessment & Plan Note (Signed)
History of STEMI 2005 No complaints of chest pain, troponin 20 and EKG nonacute Continue atorvastatin and carvedilol and pravastatin

## 2022-05-05 NOTE — H&P (Signed)
History and Physical    Patient: [name] [name] QQV:[id number] DOB: 05/30/1955 DOA: 05/05/2022 DOS: the patient was seen and examined on 05/05/2022 PCP: Bunnie Pion, FNP  Patient coming from: Home  Chief Complaint:  Chief Complaint  Patient presents with   Shortness of Breath    HPI: [name] [name]. is a 67 y.o. male with medical history significant for DM, HTN, PVD, STEMI 2005 s/p PCI, CKD 3 AA, , history of rectal cancer diagnosed in 08/2014, s/p radiation and chemo, LAR with diverting loop ileostomy followed by ileostomy takedown on 09/08/2015,cardiomyopathy with EF 20% November 2023, started on 11/27 on Coreg 3.125, Entresto half of 25/26 daily, who presents to the ED with dyspnea on exertion and recent weight gain of 35 pounds.  He also has mild lower extremity edema.  He denies lower extremity pain, cough, fever or chills. EDCourse and data review: BP 134/99 with otherwise normal vitals, troponin 20 and BNP 3983.  Hemoglobin at baseline at 11.3.  Creatinine at baseline at 1.32.  Alk phos elevated at 243, baseline in the mid 100s.  EKG, independently reviewed and interpreted NSR at 69 with nonspecific ST-T wave changes.  Chest x-ray showing the following findings: IMPRESSION: 1. Moderate to prominent enlargement of the cardiopericardial silhouette, without edema. 2. Low lung volumes are present, causing crowding of the pulmonary vasculature.  Patient treated with IV Lasix and hospitalist consulted for admission.   Review of Systems: As mentioned in the history of present illness. All other systems reviewed and are negative.  Past Medical History:  Diagnosis Date   Atherosclerosis    Cancer ([place])    Chronic kidney disease    kidney stones, UTI   Colon cancer ([place])    Coronary artery disease    Diabetes mellitus without complication (HCC)    GERD (gastroesophageal reflux disease)    Gout    Hematuria    Hyperlipidemia    Hypertension    Iron deficiency anemia     Myocardial infarction [facility]) 2005   Peripheral vascular disease (HCC)    Pulmonary nodules    Rectal cancer (HCC)    Ureter, stricture    Wears dentures    full upper   Past Surgical History:  Procedure Laterality Date   BOWEL RESECTION N/A 10/23/2014   Procedure: LOW ANTERIOR BOWEL RESECTION;  Surgeon: Sherri Rad, MD;  Location: [facility] ORS;  Service: General;  Laterality: N/A;   COLON SURGERY     COLONOSCOPY 06/06/14     COLONOSCOPY WITH ESOPHAGOGASTRODUODENOSCOPY (EGD)     CORONARY ANGIOPLASTY WITH STENT PLACEMENT  03/2004   CYSTOSCOPY WITH STENT PLACEMENT Bilateral 10/23/2014   Procedure: CYSTOSCOPY WITH STENT PLACEMENT,URETHRAL DILATION, LEFT RETROGRADE PYELOGRAM, URETEROSCOPY;  Surgeon: Hollice Espy, MD;  Location: [facility];  Service: Urology;  Laterality: Bilateral;   DIVERTING ILEOSTOMY N/A 10/23/2014   Procedure: DIVERTING ILEOSTOMY;  Surgeon: Sherri Rad, MD;  Location: [facility];  Service: General;  Laterality: N/A;   ILEOSTOMY CLOSURE N/A 09/08/2015   Procedure: ILEOSTOMY TAKEDOWN;  Surgeon: Clayburn Pert, MD;  Location: [facility];  Service: General;  Laterality: N/A;   LAPAROTOMY N/A 10/23/2014   Procedure: EXPLORATORY LAPAROTOMY;  Surgeon: Sherri Rad, MD;  Location: [facility];  Service: General;  Laterality: N/A;   PERIPHERAL VASCULAR CATHETERIZATION N/A 05/12/2015   Procedure: Abdominal Aortogram w/Lower Extremity;  Surgeon: Algernon Huxley, MD;  Location: [place] [facility];  Service: Cardiovascular;  Laterality: N/A;   PERIPHERAL VASCULAR CATHETERIZATION  05/12/2015   Procedure:  Lower Extremity Intervention;  Surgeon: Algernon Huxley, MD;  Location: [place] [facility];  Service: Cardiovascular;;   PERIPHERAL VASCULAR CATHETERIZATION N/A 06/30/2015   Procedure: Abdominal Aortogram w/Lower Extremity;  Surgeon: Algernon Huxley, MD;  Location: [place] [facility];  Service: Cardiovascular;  Laterality: N/A;   PERIPHERAL VASCULAR CATHETERIZATION  06/30/2015   Procedure: Lower Extremity  Intervention;  Surgeon: Algernon Huxley, MD;  Location: [place] [facility];  Service: Cardiovascular;;   PORT-A-CATH REMOVAL Right 09/08/2015   Procedure: REMOVAL PORT-A-CATH;  Surgeon: Clayburn Pert, MD;  Location: [facility];  Service: General;  Laterality: Right;   PORTACATH PLACEMENT  07/01/2014   Dr. Marina Gravel   Social History:  reports that he quit smoking about 37 years ago. His smoking use included cigarettes. He has never used smokeless tobacco. He reports that he does not drink alcohol and does not use drugs.  No Known Allergies  Family History  Problem Relation Age of Onset   Hypertension Brother    Hypertension Father    Diabetes Father    Diabetes Brother    Hypertension Brother    Heart disease Paternal Grandfather     Prior to Admission medications   Medication Sig Start Date End Date Taking? Authorizing Provider  acetaminophen (TYLENOL) 500 MG tablet Take 500 mg by mouth every 6 (six) hours as needed.   Yes [provider]  bumetanide (BUMEX) 0.5 MG tablet Take 0.5 mg by mouth daily. 01/22/22  Yes [provider]  carvedilol (COREG) 3.125 MG tablet Take 3.125 mg by mouth 2 (two) times daily with a meal.   Yes [provider]  colchicine 0.6 MG tablet Take 0.6 mg by mouth daily.   Yes [provider]  ENTRESTO 24-26 MG Take 0.5 tablets by mouth 2 (two) times daily.   Yes [provider]  Ferrous Sulfate (IRON) 325 (65 Fe) MG TABS Take 1 tablet by mouth every other day. 01/22/22  Yes [provider]  losartan (COZAAR) 25 MG tablet Take 25 mg by mouth daily. 01/22/22  Yes [provider]  MIRALAX 17 GM/SCOOP powder SMARTSIG:17 Gram(s) By Mouth 1-2 Times Daily 01/22/22  Yes [provider]  pravastatin (PRAVACHOL) 20 MG tablet Take 20 mg by mouth daily.   Yes [provider]  Ascorbic Acid (VITAMIN C) 1000 MG tablet Take 1,000 mg by mouth daily. Reported on 09/26/2015 Patient not taking: Reported on  05/05/2022    [provider]  atorvastatin (LIPITOR) 20 MG tablet Take 20 mg by mouth at bedtime. Patient not taking: Reported on 05/05/2022 09/19/20   [provider]  furosemide (LASIX) 20 MG tablet Take by mouth. Patient not taking: Reported on 05/05/2022 01/01/22   [provider]  polyethylene glycol powder (GLYCOLAX/MIRALAX) 17 GM/SCOOP powder Take 255 g by mouth daily. Patient not taking: Reported on 05/05/2022 03/10/22   Lin Landsman, MD    Physical Exam: Vitals:   05/05/22 1716 05/05/22 1900 05/05/22 2002  BP: (!) 134/99 (!) 132/94 (!) 135/99  Pulse: 69 75 72  Resp: '18 19 18  '$ Temp: 97.6 F (36.4 C)  97.6 F (36.4 C)  TempSrc: Oral  Oral  SpO2: 99% 96% 100%   Physical Exam Vitals and nursing note reviewed.  Constitutional:      General: He is not in acute distress. HENT:     Head: Normocephalic and atraumatic.  Cardiovascular:     Rate and Rhythm: Normal rate and regular rhythm.  Heart sounds: Normal heart sounds.  Pulmonary:     Effort: Pulmonary effort is normal.     Breath sounds: Normal breath sounds.  Abdominal:     General: Abdomen is protuberant. There is distension.     Palpations: Abdomen is soft.     Tenderness: There is no abdominal tenderness.  Musculoskeletal:     Right lower leg: Edema present.     Left lower leg: Edema present.  Neurological:     Mental Status: Mental status is at baseline.     Labs on Admission: I have personally reviewed following labs and imaging studies  CBC: Recent Labs  Lab 05/05/22 1822  WBC 6.8  NEUTROABS 5.4  HGB 11.7*  HCT 37.6*  MCV 85.1  PLT 035   Basic Metabolic Panel: Recent Labs  Lab 05/05/22 1822  NA 141  K 4.0  CL 107  CO2 21*  GLUCOSE 80  BUN 21  CREATININE 1.32*  CALCIUM 9.2   GFR: CrCl cannot be calculated (Unknown ideal weight.). Liver Function Tests: Recent Labs  Lab 05/05/22 1822  AST 28  ALT 21  ALKPHOS 243*  BILITOT 1.1  PROT 6.6  ALBUMIN  4.0   No results for input(s): "LIPASE", "AMYLASE" in the last 168 hours. No results for input(s): "AMMONIA" in the last 168 hours. Coagulation Profile: No results for input(s): "INR", "PROTIME" in the last 168 hours. Cardiac Enzymes: No results for input(s): "CKTOTAL", "CKMB", "CKMBINDEX", "TROPONINI" in the last 168 hours. BNP (last 3 results) No results for input(s): "PROBNP" in the last 8760 hours. HbA1C: No results for input(s): "HGBA1C" in the last 72 hours. CBG: No results for input(s): "GLUCAP" in the last 168 hours. Lipid Profile: No results for input(s): "CHOL", "HDL", "LDLCALC", "TRIG", "CHOLHDL", "LDLDIRECT" in the last 72 hours. Thyroid Function Tests: Recent Labs    05/05/22 1822  TSH 3.965   Anemia Panel: No results for input(s): "VITAMINB12", "FOLATE", "FERRITIN", "TIBC", "IRON", "RETICCTPCT" in the last 72 hours. Urine analysis:    Component Value Date/Time   COLORURINE AMBER (A) 10/17/2014 1035   APPEARANCEUR Cloudy (A) 11/20/2014 0913   LABSPEC 1.014 10/17/2014 1035   LABSPEC 1.013 02/01/2014 1416   PHURINE 5.0 10/17/2014 1035   GLUCOSEU Negative 11/20/2014 0913   GLUCOSEU Negative 02/01/2014 1416   HGBUR 3+ (A) 10/17/2014 1035   BILIRUBINUR Negative 11/20/2014 0913   BILIRUBINUR Negative 02/01/2014 1416   KETONESUR NEGATIVE 10/17/2014 1035   PROTEINUR 2+ (A) 11/20/2014 0913   PROTEINUR 30 (A) 10/17/2014 1035   NITRITE Negative 11/20/2014 0913   NITRITE NEGATIVE 10/17/2014 1035   LEUKOCYTESUR 1+ (A) 11/20/2014 0913   LEUKOCYTESUR Trace 02/01/2014 1416    Radiological Exams on Admission: DG Chest 2 View  Result Date: 05/05/2022 CLINICAL DATA:  Shortness of breath. Reportedly 35 pound weight gain over the past month. EXAM: CHEST - 2 VIEW COMPARISON:  02/19/2022 chest CT and radiograph from 08/28/2015 FINDINGS: Low lung volumes are present, causing crowding of the pulmonary vasculature. Moderate to prominent enlargement of the cardiopericardial  silhouette. No pulmonary edema. The lungs appear clear. No blunting of the costophrenic angles. Lower thoracic spondylosis. IMPRESSION: 1. Moderate to prominent enlargement of the cardiopericardial silhouette, without edema. 2. Low lung volumes are present, causing crowding of the pulmonary vasculature. Electronically Signed   By: Van Clines M.D.   On: 05/05/2022 17:54     Data Reviewed: Relevant notes from primary care and specialist visits, past discharge summaries as available in EHR, including Care Everywhere. Prior  diagnostic testing as pertinent to current admission diagnoses Updated medications and problem lists for reconciliation ED course, including vitals, labs, imaging, treatment and response to treatment Triage notes, nursing and pharmacy notes and ED provider's notes Notable results as noted in HPI   Assessment and Plan: * Acute on chronic systolic CHF (congestive heart failure) (HCC) Cardiomyopathy, EF 20% 02/2022 Continue IV Lasix Continue carvedilol, Entresto Daily weights with intake and output monitoring Cardiology consult for additional recommendations in view of recent low EF  CAD (coronary artery disease) History of STEMI 2005 No complaints of chest pain, troponin 20 and EKG nonacute Continue atorvastatin and carvedilol and pravastatin  HTN (hypertension) BP controlled Continue Entresto and carvedilol  Diabetes mellitus ([place]) Diet controlled Get A1c Sliding scale insulin coverage while hospitalized  Stage 3a chronic kidney disease ([place]) Renal function at baseline  History of rectal cancer Rectal adenocarcinoma diagnosed in 08/2014, s/p radiation and chemo, LAR with diverting loop ileostomy followed by ileostomy takedown on 09/08/2015  Seen by GI 02/2022  Chronic anemia Stable at 11-12  Abnormal liver function tests Elevated alk phos to the mid 200s, baseline mid 407W etiology uncertain Outpatient follow-up     DVT prophylaxis:  Lovenox  Consults: Cardiology, Dr.:  Advance Care Planning:   Code Status: Prior   Family Communication: none  Disposition Plan: Back to previous home environment  Severity of Illness: The appropriate patient status for this patient is INPATIENT. Inpatient status is judged to be reasonable and necessary in order to provide the required intensity of service to ensure the patient's safety. The patient's presenting symptoms, physical exam findings, and initial radiographic and laboratory data in the context of their chronic comorbidities is felt to place them at high risk for further clinical deterioration. Furthermore, it is not anticipated that the patient will be medically stable for discharge from the hospital within 2 midnights of admission.   * I certify that at the point of admission it is my clinical judgment that the patient will require inpatient hospital care spanning beyond 2 midnights from the point of admission due to high intensity of service, high risk for further deterioration and high frequency of surveillance required.*  Author: Athena Masse, MD 05/05/2022 8:11 PM  For on call review [email or web address].

## 2022-05-06 DIAGNOSIS — I5023 Acute on chronic systolic (congestive) heart failure: Secondary | ICD-10-CM | POA: Diagnosis not present

## 2022-05-06 LAB — RESP PANEL BY RT-PCR (RSV, FLU A&B, COVID)  RVPGX2
Influenza A by PCR: NEGATIVE
Influenza B by PCR: NEGATIVE
Resp Syncytial Virus by PCR: NEGATIVE
SARS Coronavirus 2 by RT PCR: POSITIVE — AB

## 2022-05-06 LAB — CBC
HCT: 34.5 % — ABNORMAL LOW (ref 39.0–52.0)
Hemoglobin: 10.9 g/dL — ABNORMAL LOW (ref 13.0–17.0)
MCH: 26.5 pg (ref 26.0–34.0)
MCHC: 31.6 g/dL (ref 30.0–36.0)
MCV: 83.9 fL (ref 80.0–100.0)
Platelets: 197 10*3/uL (ref 150–400)
RBC: 4.11 MIL/uL — ABNORMAL LOW (ref 4.22–5.81)
RDW: 17.2 % — ABNORMAL HIGH (ref 11.5–15.5)
WBC: 6.2 10*3/uL (ref 4.0–10.5)
nRBC: 0 % (ref 0.0–0.2)

## 2022-05-06 LAB — BASIC METABOLIC PANEL
Anion gap: 14 (ref 5–15)
BUN: 22 mg/dL (ref 8–23)
CO2: 22 mmol/L (ref 22–32)
Calcium: 8.9 mg/dL (ref 8.9–10.3)
Chloride: 107 mmol/L (ref 98–111)
Creatinine, Ser: 1.4 mg/dL — ABNORMAL HIGH (ref 0.61–1.24)
GFR, Estimated: 55 mL/min — ABNORMAL LOW (ref >60)
Glucose, Bld: 70 mg/dL (ref 70–99)
Potassium: 3.9 mmol/L (ref 3.5–5.1)
Sodium: 143 mmol/L (ref 135–145)

## 2022-05-06 LAB — HIV ANTIBODY (ROUTINE TESTING W REFLEX): HIV Screen 4th Generation wRfx: NONREACTIVE

## 2022-05-06 LAB — CBG MONITORING, ED
Glucose-Capillary: 62 mg/dL — ABNORMAL LOW (ref 70–99)
Glucose-Capillary: 69 mg/dL — ABNORMAL LOW (ref 70–99)
Glucose-Capillary: 64 mg/dL — ABNORMAL LOW (ref 70–99)
Glucose-Capillary: 83 mg/dL (ref 70–99)

## 2022-05-06 MED ORDER — NIRMATRELVIR/RITONAVIR (PAXLOVID) TABLET (RENAL DOSING)
2.0000 | ORAL_TABLET | Freq: Two times a day (BID) | ORAL | Status: AC
  Administered 2022-05-07 – 2022-05-11 (×8): 2 via ORAL
  Filled 2022-05-06: qty 20

## 2022-05-06 MED ORDER — FUROSEMIDE 10 MG/ML IJ SOLN
40.0000 mg | Freq: Two times a day (BID) | INTRAMUSCULAR | Status: DC
  Administered 2022-05-06 – 2022-05-11 (×12): 40 mg via INTRAVENOUS
  Filled 2022-05-06 (×11): qty 4

## 2022-05-06 NOTE — Discharge Instructions (Signed)

## 2022-05-06 NOTE — ED Notes (Signed)
Pt blood glucose 69. Pt given juice.

## 2022-05-06 NOTE — ED Notes (Signed)
Pt taken to bathroom via WC, wearing 2LNC

## 2022-05-06 NOTE — ED Notes (Signed)
Pt blood sugar 64, pt given meal tray and 2 4oz orange juice.

## 2022-05-06 NOTE — ED Notes (Signed)
Assumed care from Anna,RN. Pt taken to restroom in wheelchair. Pt resting comfortably. Pt denies additional needs or concerns at this time.

## 2022-05-06 NOTE — ED Notes (Signed)
MD Billie Ruddy notified patient has had 3 episodes of diarrhea today.

## 2022-05-06 NOTE — Consult Note (Signed)
CARDIOLOGY CONSULT NOTE               Patient ID: [name] Kehr [name] MRN: [id number] DOB/AGE: 06-26-1955 67 y.o.  Admit date: 05/05/2022 Referring Physician Dr. Enzo Bi hospitalist Primary Physician [name], NP Primary Cardiologist [facility] Reason for Consultation shortness of breath acute on chronic systolic congestive heart failure  HPI: Patient is a 67 year old black male history of multiple medical problems chronic systolic congestive heart failure cardiomyopathy EF around 20% previous STEMI with PCI and stent chronic renal sufficiency stage III obesity diabetes peripheral vascular disease possible obstructive sleep apnea rectal cancer status post radiation and chemo presented with acute dyspnea shortness of breath the patient states she may not be taking his medications appropriately and presented with volume overload shortness of breath dyspnea EKG with nonspecific and had no significant elevation in troponins denies any significant chest pain just dyspnea shortness of breath BMP was close to 4000.  Patient states she gained a lot of weight and gained a fair amount of fluid.  Thus far patient seems to responded to IV Lasix therapy has improved shortness of breath improve swelling  Review of systems complete and found to be negative unless listed above     Past Medical History:  Diagnosis Date   Atherosclerosis    Cancer ([place])    Chronic kidney disease    kidney stones, UTI   Colon cancer ([place])    Coronary artery disease    Diabetes mellitus without complication (HCC)    GERD (gastroesophageal reflux disease)    Gout    Hematuria    Hyperlipidemia    Hypertension    Iron deficiency anemia    Myocardial infarction [facility]) 2005   Peripheral vascular disease ([place])    Pulmonary nodules    Rectal cancer (HCC)    Ureter, stricture    Wears dentures    full upper    Past Surgical History:  Procedure Laterality Date   BOWEL RESECTION N/A 10/23/2014   Procedure:  LOW ANTERIOR BOWEL RESECTION;  Surgeon: Sherri Rad, MD;  Location: [facility] ORS;  Service: General;  Laterality: N/A;   COLON SURGERY     COLONOSCOPY 06/06/14     COLONOSCOPY WITH ESOPHAGOGASTRODUODENOSCOPY (EGD)     CORONARY ANGIOPLASTY WITH STENT PLACEMENT  03/2004   CYSTOSCOPY WITH STENT PLACEMENT Bilateral 10/23/2014   Procedure: CYSTOSCOPY WITH STENT PLACEMENT,URETHRAL DILATION, LEFT RETROGRADE PYELOGRAM, URETEROSCOPY;  Surgeon: Hollice Espy, MD;  Location: [facility] OR[facility];  Service: Urology;  Laterality: Bilateral;   DIVERTING ILEOSTOMY N/A 10/23/2014   Procedure: DIVERTING ILEOSTOMY;  Surgeon: Sherri Rad, MD;  Location: [facility];  Service: General;  Laterality: N/A;   ILEOSTOMY CLOSURE N/A 09/08/2015   Procedure: ILEOSTOMY TAKEDOWN;  Surgeon: Clayburn Pert, MD;  Location: [facility];  Service: General;  Laterality: N/A;   LAPAROTOMY N/A 10/23/2014   Procedure: EXPLORATORY LAPAROTOMY;  Surgeon: Sherri Rad, MD;  Location: [facility];  Service: General;  Laterality: N/A;   PERIPHERAL VASCULAR CATHETERIZATION N/A 05/12/2015   Procedure: Abdominal Aortogram w/Lower Extremity;  Surgeon: Algernon Huxley, MD;  Location: [place] [facility];  Service: Cardiovascular;  Laterality: N/A;   PERIPHERAL VASCULAR CATHETERIZATION  05/12/2015   Procedure: Lower Extremity Intervention;  Surgeon: Algernon Huxley, MD;  Location: [place] CV [facility];  Service: Cardiovascular;;   PERIPHERAL VASCULAR CATHETERIZATION N/A 06/30/2015   Procedure: Abdominal Aortogram w/Lower Extremity;  Surgeon: Algernon Huxley, MD;  Location: [place] [facility];  Service: Cardiovascular;  Laterality: N/A;   PERIPHERAL  VASCULAR CATHETERIZATION  06/30/2015   Procedure: Lower Extremity Intervention;  Surgeon: Algernon Huxley, MD;  Location: [place] [facility];  Service: Cardiovascular;;   PORT-A-CATH REMOVAL Right 09/08/2015   Procedure: REMOVAL PORT-A-CATH;  Surgeon: Clayburn Pert, MD;  Location: [facility];  Service: General;  Laterality: Right;   PORTACATH PLACEMENT   07/01/2014   Dr. Marina Gravel    (Not in a hospital admission)  Social History   Socioeconomic History   Marital status: Married    Spouse name: Not on file   Number of children: Not on file   Years of education: Not on file   Highest education level: Not on file  Occupational History   Not on file  Tobacco Use   Smoking status: Former    Types: Cigarettes    Quit date: 11/19/1984    Years since quitting: 37.4   Smokeless tobacco: Never   Tobacco comments:    smoked for brief period in his teens  Substance and Sexual Activity   Alcohol use: No    Alcohol/week: 0.0 standard drinks of alcohol   Drug use: No   Sexual activity: Not on file  Other Topics Concern   Not on file  Social History Narrative   Not on file   Social Determinants of Health   Financial Resource Strain: Not on file  Food Insecurity: Not on file  Transportation Needs: Not on file  Physical Activity: Not on file  Stress: Not on file  Social Connections: Not on file  Intimate Partner Violence: Not on file    Family History  Problem Relation Age of Onset   Hypertension Brother    Hypertension Father    Diabetes Father    Diabetes Brother    Hypertension Brother    Heart disease Paternal Grandfather       Review of systems complete and found to be negative unless listed above      PHYSICAL EXAM  General: Well developed, well nourished, in no acute distress HEENT:  Normocephalic and atramatic Neck:  No JVD.  Lungs: Clear bilaterally to auscultation and percussion. Heart: HRRR . Normal S1 and S2 without gallops or murmurs.  Abdomen: Bowel sounds are positive, abdomen soft and non-tender  Msk:  Back normal, normal gait. Normal strength and tone for age. Extremities: No clubbing, cyanosis or edema.   Neuro: Alert and oriented X 3. Psych:  Good affect, responds appropriately  Labs:   Lab Results  Component Value Date   WBC 6.2 05/06/2022   HGB 10.9 (L) 05/06/2022   HCT 34.5 (L) 05/06/2022    MCV 83.9 05/06/2022   PLT 197 05/06/2022    Recent Labs  Lab 05/05/22 1822 05/06/22 0423  NA 141 143  K 4.0 3.9  CL 107 107  CO2 21* 22  BUN 21 22  CREATININE 1.32* 1.40*  CALCIUM 9.2 8.9  PROT 6.6  --   BILITOT 1.1  --   ALKPHOS 243*  --   ALT 21  --   AST 28  --   GLUCOSE 80 70   No results found for: "CKTOTAL", "CKMB", "CKMBINDEX", "TROPONINI" No results found for: "CHOL" No results found for: "HDL" No results found for: "LDLCALC" No results found for: "TRIG" No results found for: "CHOLHDL" No results found for: "LDLDIRECT"    Radiology: DG Chest 2 View  Result Date: 05/05/2022 CLINICAL DATA:  Shortness of breath. Reportedly 35 pound weight gain over the past month. EXAM: CHEST - 2 VIEW COMPARISON:  02/19/2022  chest CT and radiograph from 08/28/2015 FINDINGS: Low lung volumes are present, causing crowding of the pulmonary vasculature. Moderate to prominent enlargement of the cardiopericardial silhouette. No pulmonary edema. The lungs appear clear. No blunting of the costophrenic angles. Lower thoracic spondylosis. IMPRESSION: 1. Moderate to prominent enlargement of the cardiopericardial silhouette, without edema. 2. Low lung volumes are present, causing crowding of the pulmonary vasculature. Electronically Signed   By: Van Clines M.D.   On: 05/05/2022 17:54    EKG: Normal sinus rhythm nonspecific ST-T wave changes rate of around 70  ASSESSMENT AND PLAN:  Acute on chronic systolic congestive heart failure Cardiomyopathy Coronary artery disease Hypertension Diabetes Chronic renal sufficiency stage III Obesity Possible obstructive sleep apnea Anemia Rectal cancer Poor compliance . Plan Agree admit rule out microinfarction follow-up EKGs troponins Recommend diuretic therapy for congestive heart failure Resume heart failure medications Entresto Lasix Coreg poor spironolactone candidate because of renal insufficiency Cardiomyopathy ejection fraction around 20%  agree with Delene Loll Coreg Lasix consider Farxiga Hypertension management and control with Coreg and Entresto Hyperlipidemia management with Lipitor IV diuretic therapy to help with heart failure type symptoms Chronic renal sufficiency consider nephrology input Do not recommend any invasive cardiac procedures at this point  Signed: Yolonda Kida MD 05/06/2022, 4:17 PM

## 2022-05-06 NOTE — ED Notes (Signed)
Pt blood sugar 62. Pt given meal tray and has juice at bedside.

## 2022-05-06 NOTE — Progress Notes (Signed)
  PROGRESS NOTE    [name] DECKARD [name]  JQZ:[id number] DOB: 01-28-1956 DOA: 05/05/2022 PCP: Bunnie Pion, FNP  ED35A/ED35A  LOS: 1 day   Brief hospital course:   Assessment & Plan: [name] Kehr [name] is a 67 y.o. male with medical history significant for DM, HTN, PVD, STEMI 2005 s/p PCI, CKD 3 AA, , history of rectal cancer diagnosed in 08/2014, s/p radiation and chemo, LAR with diverting loop ileostomy followed by ileostomy takedown on 09/08/2015,cardiomyopathy with EF 20% November 2023, started on 11/27 on Coreg 3.125, Entresto half of 25/26 daily, who presents to the ED with dyspnea on exertion and recent weight gain of 35 pounds.  He also has mild lower extremity edema.    * Acute on chronic systolic CHF (congestive heart failure) (HCC) Cardiomyopathy, EF 20% 02/2022 Started on IV Lasix, cardio consulted Plan: --cont IV lasix 40 BID --cont coreg --hold Entresto due to soft BP  COVID infection --relatively asymptomatic --obtain CRP  Acute hypoxemic respiratory failure --ED note documented "Pt' Oxygen saturation drooped down into the mid 80's, pt placed on 2L." --could be due to CHF exacerbation or covid or both --Continue supplemental O2 to keep sats >=90%, wean as tolerated  CAD (coronary artery disease) History of STEMI 2005 No complaints of chest pain, troponin 20 and EKG nonacute --cont statin  HTN (hypertension) BP soft --cont coreg --hold Entresto due to soft BP while diuresing  Diabetes mellitus ([place]) With hypoglycemia Diet controlled Get A1c --BG checks to monitor for hypoglycemia  Stage 3a chronic kidney disease ([place]) Renal function at baseline  History of rectal cancer Rectal adenocarcinoma diagnosed in 08/2014, s/p radiation and chemo, LAR with diverting loop ileostomy followed by ileostomy takedown on 09/08/2015  Seen by GI 02/2022  Chronic anemia Stable at 11-12  Abnormal liver function tests Elevated alk phos to the mid 200s, baseline mid  622Q etiology uncertain Outpatient follow-up   DVT prophylaxis: Lovenox SQ Code Status: Full code  Family Communication:  Level of care: Progressive Dispo:   The patient is from: home Anticipated d/c is to: home Anticipated d/c date is: 2-3 days   Subjective and Interval History:  Pt reported breathing a little better, voiding a little more than usual.   Objective: Vitals:   05/06/22 1200 05/06/22 1230 05/06/22 1300 05/06/22 1430  BP: 115/87 110/82 111/80 108/75  Pulse: 61 60 (!) 58 72  Resp: (!) '26 17 17 16  '$ Temp:   98 F (36.7 C)   TempSrc:      SpO2: 100% 100% 100% 100%    Intake/Output Summary (Last 24 hours) at 05/06/2022 1608 Last data filed at 05/06/2022 1447 Gross per 24 hour  Intake --  Output 1450 ml  Net -1450 ml   There were no vitals filed for this visit.  Examination:   Constitutional: NAD, AAOx3 HEENT: conjunctivae and lids normal, EOMI CV: No cyanosis.   RESP: normal respiratory effort, on 2L Extremities: edema in BLE SKIN: warm, dry Neuro: II - XII grossly intact.   Psych: Normal mood and affect.  Appropriate judgement and reason   Data Reviewed: I have personally reviewed labs and imaging studies  Time spent: 50 minutes  Enzo Bi, MD [facility] If 7PM-7AM, please contact night-coverage 05/06/2022, 4:08 PM

## 2022-05-06 NOTE — Consult Note (Signed)
   Heart Failure Nurse Navigator Note  HFrEF 20%.  Left atrium is severely enlarged.  Moderately reduced right ventricular systolic function.  Moderate mitral regurgitation.  Moderate pulmonary insufficiency.  Mild tricuspid regurgitation.  Patent PFO.  Patient presented to the emergency room with increased shortness of breath, lower extremity edema and a 35 pound weight gain.  BNP 3983.  Comorbidities:  Diabetes Hypertension Peripheral vascular disease STEMI with PCI Chronic kidney disease stage III History of rectal cancer status post radiation and chemo  Medications:  Atorvastatin 20 mg daily Carvedilol 3.125 mg 2 times a day with meals Furosemide 40 mg IV 2 times a day   Labs:  Sodium 143, potassium 3.9, chloride 107, CO2 21, BUN 22, creatinine 1.4, estimated GFR 55.  Meeting with patient and his wife who was at the bedside.  Patient was lying on a gurney in no acute distress currently on O2 per nasal cannula.  Wife states they have heard of the term heart failure before but she does not necessarily believe that her husband has heart failure.  Went over the meaning of heart failure as far as not being able to heart as a pump not meet the demands of the body.  Discussed the  function of his heart pumping at 20%.  Patient states that he does not use salt on his food.  They do eat at least 1 meal at a restaurant daily.  He tries to get grilled chicken or fish along with a vegetable.  Also discussed fluid restriction of 64 ounces and all that constitutes a liquid.  Patient states that he does not weigh himself daily.  Explained  the reasoning behind daily weights and what to report.  So made aware of follow-up in the outpatient heart failure clinic she has an appointment on January 23 at 9 AM.  They were given the living with heart failure teaching booklet, zone magnet, info on low-sodium and heart failure along with weight chart.  He had no further questions and we will  continue to follow along.  Pricilla Riffle RN [facility]

## 2022-05-07 ENCOUNTER — Encounter: Payer: Self-pay | Admitting: Internal Medicine

## 2022-05-07 ENCOUNTER — Telehealth: Payer: Self-pay

## 2022-05-07 DIAGNOSIS — I5023 Acute on chronic systolic (congestive) heart failure: Secondary | ICD-10-CM | POA: Diagnosis not present

## 2022-05-07 LAB — CBC
HCT: 33.6 % — ABNORMAL LOW (ref 39.0–52.0)
Hemoglobin: 10.6 g/dL — ABNORMAL LOW (ref 13.0–17.0)
MCH: 26.6 pg (ref 26.0–34.0)
MCHC: 31.5 g/dL (ref 30.0–36.0)
MCV: 84.2 fL (ref 80.0–100.0)
Platelets: 211 10*3/uL (ref 150–400)
RBC: 3.99 MIL/uL — ABNORMAL LOW (ref 4.22–5.81)
RDW: 17.2 % — ABNORMAL HIGH (ref 11.5–15.5)
WBC: 5.8 10*3/uL (ref 4.0–10.5)
nRBC: 0 % (ref 0.0–0.2)

## 2022-05-07 LAB — BASIC METABOLIC PANEL
Anion gap: 12 (ref 5–15)
BUN: 22 mg/dL (ref 8–23)
CO2: 21 mmol/L — ABNORMAL LOW (ref 22–32)
Calcium: 8.7 mg/dL — ABNORMAL LOW (ref 8.9–10.3)
Chloride: 106 mmol/L (ref 98–111)
Creatinine, Ser: 1.4 mg/dL — ABNORMAL HIGH (ref 0.61–1.24)
GFR, Estimated: 55 mL/min — ABNORMAL LOW (ref >60)
Glucose, Bld: 75 mg/dL (ref 70–99)
Potassium: 3.2 mmol/L — ABNORMAL LOW (ref 3.5–5.1)
Sodium: 139 mmol/L (ref 135–145)

## 2022-05-07 LAB — HEMOGLOBIN A1C
Hgb A1c MFr Bld: 5.5 % (ref 4.8–5.6)
Mean Plasma Glucose: 111.15 mg/dL

## 2022-05-07 LAB — CBG MONITORING, ED
Glucose-Capillary: 67 mg/dL — ABNORMAL LOW (ref 70–99)
Glucose-Capillary: 73 mg/dL (ref 70–99)
Glucose-Capillary: 64 mg/dL — ABNORMAL LOW (ref 70–99)
Glucose-Capillary: 99 mg/dL (ref 70–99)

## 2022-05-07 LAB — C-REACTIVE PROTEIN: CRP: 0.6 mg/dL (ref <1.0)

## 2022-05-07 LAB — MAGNESIUM: Magnesium: 1.9 mg/dL (ref 1.7–2.4)

## 2022-05-07 LAB — GLUCOSE, CAPILLARY: Glucose-Capillary: 144 mg/dL — ABNORMAL HIGH (ref 70–99)

## 2022-05-07 MED ORDER — POTASSIUM CHLORIDE CRYS ER 20 MEQ PO TBCR
40.0000 meq | EXTENDED_RELEASE_TABLET | Freq: Once | ORAL | Status: AC
  Administered 2022-05-07: 40 meq via ORAL
  Filled 2022-05-07: qty 2

## 2022-05-07 MED ORDER — ENSURE ENLIVE PO LIQD
237.0000 mL | Freq: Two times a day (BID) | ORAL | Status: DC
  Administered 2022-05-08 – 2022-05-13 (×11): 237 mL via ORAL

## 2022-05-07 NOTE — Progress Notes (Signed)
  PROGRESS NOTE    [name] LAFOY [name]  CNO:[id number] DOB: Sep 10, 1955 DOA: 05/05/2022 PCP: Bunnie Pion, FNP  ED35A/ED35A  LOS: 2 days   Brief hospital course:   Assessment & Plan: [name] Kehr [name] is a 67 y.o. male with medical history significant for DM, HTN, PVD, STEMI 2005 s/p PCI, CKD 3 AA, , history of rectal cancer diagnosed in 08/2014, s/p radiation and chemo, LAR with diverting loop ileostomy followed by ileostomy takedown on 09/08/2015,cardiomyopathy with EF 20% November 2023, started on 11/27 on Coreg 3.125, Entresto half of 25/26 daily, who presents to the ED with dyspnea on exertion and recent weight gain of 35 pounds.  He also has mild lower extremity edema.    * Acute on chronic systolic CHF (congestive heart failure) (HCC) Cardiomyopathy, EF 20% 02/2022 Started on IV Lasix, cardio consulted Plan: --cont IV lasix 40 BID --cont coreg --hold Entresto due to soft BP during diuresing  COVID infection --relatively asymptomatic, however, did present with dyspnea and hypoxia. --CRP low --cont Paxlovid  Acute hypoxemic respiratory failure --ED note documented "Pt' Oxygen saturation drooped down into the mid 80's, pt placed on 2L." --could be due to CHF exacerbation or covid or both --Continue supplemental O2 to keep sats >=90%, wean as tolerated  CAD (coronary artery disease) History of STEMI 2005 No complaints of chest pain, troponin 20 and EKG nonacute --cont statin  HTN (hypertension) BP soft --cont coreg --hold Entresto due to soft BP while diuresing  Diabetes mellitus, currently not active With hypoglycemia Diet controlled, not on hypoglycemics at home. A1c 5.5. --BG checks to monitor for hypoglycemia  Stage 3a chronic kidney disease ([place]) Renal function at baseline  History of rectal cancer Rectal adenocarcinoma diagnosed in 08/2014, s/p radiation and chemo, LAR with diverting loop ileostomy followed by ileostomy takedown on 09/08/2015  Seen by  GI 02/2022  Chronic anemia Stable at 11-12  Abnormal liver function tests Elevated alk phos to the mid 200s, baseline mid 294T etiology uncertain Outpatient follow-up   DVT prophylaxis: Lovenox SQ Code Status: Full code  Family Communication: wife updated at bedside today Level of care: Progressive Dispo:   The patient is from: home Anticipated d/c is to: home Anticipated d/c date is: 2-3 days   Subjective and Interval History:  Pt reported swelling improved.   Objective: Vitals:   05/07/22 1030 05/07/22 1130 05/07/22 1502 05/07/22 1502  BP: 122/86  113/89   Pulse: 70     Resp: (!) 24  10   Temp:  97.7 F (36.5 C)  97.6 F (36.4 C)  TempSrc:  Oral  Oral  SpO2: 98%       Intake/Output Summary (Last 24 hours) at 05/07/2022 1513 Last data filed at 05/07/2022 1320 Gross per 24 hour  Intake --  Output 470 ml  Net -470 ml   There were no vitals filed for this visit.  Examination:   Constitutional: NAD, AAOx3, sitting at side of bed HEENT: conjunctivae and lids normal, EOMI CV: No cyanosis.   RESP: normal respiratory effort Extremities: tense edema in BLE SKIN: warm, dry Neuro: II - XII grossly intact.   Psych: Normal mood and affect.  Appropriate judgement and reason   Data Reviewed: I have personally reviewed labs and imaging studies  Time spent: 35 minutes  [name] Bi, MD [facility] If 7PM-7AM, please contact night-coverage 05/07/2022, 3:13 PM

## 2022-05-07 NOTE — Progress Notes (Signed)
[facility]    SUBJECTIVE: Patient states he feels somewhat better denies worsening shortness of breath denies any chest pain less leg edema he feels like he is lost a fair amount of fluid and is improving   Vitals:   05/07/22 0300 05/07/22 0330 05/07/22 0400 05/07/22 0633  BP: 112/72 105/75 (!) 115/91 111/81  Pulse: (!) 57 (!) 58  (!) 59  Resp:    17  Temp:    (!) 97.3 F (36.3 C)  TempSrc:    Oral  SpO2: 100% 100%  100%     Intake/Output Summary (Last 24 hours) at 05/07/2022 0656 Last data filed at 05/06/2022 2045 Gross per 24 hour  Intake --  Output 1225 ml  Net -1225 ml      PHYSICAL EXAM  General: Well developed, well nourished, in no acute distress HEENT:  Normocephalic and atramatic Neck:  No JVD.  Lungs: Clear bilaterally to auscultation and percussion. Heart: HRRR . Normal S1 and S2 without gallops or murmurs.  Abdomen: Bowel sounds are positive, abdomen soft and non-tender  Msk:  Back normal, normal gait. Normal strength and tone for age. Extremities: No clubbing, cyanosis or edema.   Neuro: Alert and oriented X 3. Psych:  Good affect, responds appropriately   LABS: Basic Metabolic Panel: Recent Labs    05/05/22 1822 05/06/22 0423  NA 141 143  K 4.0 3.9  CL 107 107  CO2 21* 22  GLUCOSE 80 70  BUN 21 22  CREATININE 1.32* 1.40*  CALCIUM 9.2 8.9   Liver Function Tests: Recent Labs    05/05/22 1822  AST 28  ALT 21  ALKPHOS 243*  BILITOT 1.1  PROT 6.6  ALBUMIN 4.0   No results for input(s): "LIPASE", "AMYLASE" in the last 72 hours. CBC: Recent Labs    05/05/22 1822 05/06/22 0423 05/07/22 0625  WBC 6.8 6.2 5.8  NEUTROABS 5.4  --   --   HGB 11.7* 10.9* 10.6*  HCT 37.6* 34.5* 33.6*  MCV 85.1 83.9 84.2  PLT 227 197 211   Cardiac Enzymes: No results for input(s): "CKTOTAL", "CKMB", "CKMBINDEX", "TROPONINI" in the last 72 hours. BNP: Invalid input(s): "POCBNP" D-Dimer: No results for input(s): "DDIMER" in the last 72  hours. Hemoglobin A1C: Recent Labs    05/06/22 0423  HGBA1C 5.5   Fasting Lipid Panel: No results for input(s): "CHOL", "HDL", "LDLCALC", "TRIG", "CHOLHDL", "LDLDIRECT" in the last 72 hours. Thyroid Function Tests: Recent Labs    05/05/22 1822  TSH 3.965   Anemia Panel: No results for input(s): "VITAMINB12", "FOLATE", "FERRITIN", "TIBC", "IRON", "RETICCTPCT" in the last 72 hours.  DG Chest 2 View  Result Date: 05/05/2022 CLINICAL DATA:  Shortness of breath. Reportedly 35 pound weight gain over the past month. EXAM: CHEST - 2 VIEW COMPARISON:  02/19/2022 chest CT and radiograph from 08/28/2015 FINDINGS: Low lung volumes are present, causing crowding of the pulmonary vasculature. Moderate to prominent enlargement of the cardiopericardial silhouette. No pulmonary edema. The lungs appear clear. No blunting of the costophrenic angles. Lower thoracic spondylosis. IMPRESSION: 1. Moderate to prominent enlargement of the cardiopericardial silhouette, without edema. 2. Low lung volumes are present, causing crowding of the pulmonary vasculature. Electronically Signed   By: Van Clines M.D.   On: 05/05/2022 17:54     Echo severely depressed left ventricular function EF around 20 to 25%  TELEMETRY: Sinus rhythm nonspecific ST-T wave changes: Rate of 80  ASSESSMENT AND PLAN:  Principal Problem:   Acute on chronic systolic  CHF (congestive heart failure) (HCC) Active Problems:   CAD (coronary artery disease)   HTN (hypertension)   Diabetes mellitus (HCC)   Abnormal liver function tests   History of rectal cancer   Stage 3a chronic kidney disease (HCC)   Cardiomyopathy (HCC)   Chronic anemia    Plan Acute on early systolic congestive heart failure elevated BNP recommend continue diuretic therapy Resume Entresto for congestive heart failure and systolic dysfunction Hypertension reasonably controlled continue Coreg Entresto avoid spironolactone because of renal insufficiency consider  adding Farxiga Cardiomyopathy Entresto Coreg Lasix consider [place] consider spironolactone with low-dose Acute on chronic renal insufficiency recommend recommend nephrology input try to avoid nephrotoxic drugs Obesity recommend weight loss exercise portion control Possible obstructive sleep apnea recommend sleep study CPAP if indicated and weight loss Diabetes type 2 continue diabetes management and control including diet Known coronary artery disease previous PCI and stent continue current management beta-blocker aspirin Hyperlipidemia continue statin therapy for lipid management History of rectal cancer with ileostomy in place Shortness of breath dyspnea improved today after diuretic therapy   Yolonda Kida, MD 05/07/2022 6:56 AM

## 2022-05-07 NOTE — Telephone Encounter (Signed)
Patient wife states that she would rather cancel the procedure at this time and will call us back to reschedule when he is feeling better and back at home

## 2022-05-07 NOTE — Telephone Encounter (Signed)
Received message from [place] in Endoscopy this morning.  She stated that patient is in the hospital currently  01/17/24and has COVID, Acute Respiratory Failure.  She has placed him in the depot for rescheduling his procedure with Dr. Marius Ditch. He is scheduled for his colonoscopy 05/14/22. He was seen in office on 03/10/22 for history of rectal cancer.  Thanks, [place], [place]

## 2022-05-07 NOTE — TOC Initial Note (Signed)
Transition of Care (TOC) - Initial/Assessment Note    Patient Details  Name: [name] WRIGHTSMAN [name] MRN: [id number] Date of Birth: [birth year]  Transition of Care [facility]) CM/SW Contact:    Shelbie Hutching, RN Phone Number: 05/07/2022, 3:39 PM  Clinical Narrative:                 Patient seen by Heart failure nurse navigator and provided information with heart care at home.  No TOC needs identified at this time.  He has a heart failure clinic appointment f/u Jan. 23rd.    Please re-consult if needs arise.   Expected Discharge Plan: Home/Self Care Barriers to Discharge: Continued Medical Work up   Patient Goals and CMS Choice            Expected Discharge Plan and Services   Discharge Planning Services: CM Consult   Living arrangements for the past 2 months: Single Family Home                 DME Arranged: N/A DME Agency: NA       HH Arranged: NA          Prior Living Arrangements/Services Living arrangements for the past 2 months: Single Family Home Lives with:: Spouse   Do you feel safe going back to the place where you live?: Yes               Activities of Daily Living Home Assistive Devices/Equipment: Cane (specify quad or straight), Walker (specify type) ADL Screening (condition at time of admission) Patient's cognitive ability adequate to safely complete daily activities?: Yes Is the patient deaf or have difficulty hearing?: No Does the patient have difficulty seeing, even when wearing glasses/contacts?: No Does the patient have difficulty concentrating, remembering, or making decisions?: No Patient able to express need for assistance with ADLs?: Yes Does the patient have difficulty dressing or bathing?: No Independently performs ADLs?: Yes (appropriate for developmental age) Does the patient have difficulty walking or climbing stairs?: No Weakness of Legs: None Weakness of Arms/Hands: None  Permission Sought/Granted                   Emotional Assessment         Alcohol / Substance Use: Not Applicable Psych Involvement: No (comment)  Admission diagnosis:  Acute on chronic diastolic (congestive) heart failure (HCC) [I50.33] Patient Active Problem List   Diagnosis Date Noted   Acute on chronic systolic CHF (congestive heart failure) ([place]) 05/05/2022   History of rectal cancer 05/05/2022   Stage 3a chronic kidney disease ([place]) 05/05/2022   Cardiomyopathy ([place]) 05/05/2022   Chronic anemia 05/05/2022   Unintentional weight loss 02/08/2022   Elevated CEA 01/20/2020   Microcytic red blood cells 07/10/2019   B12 deficiency 05/26/2018   Abnormal liver function tests 01/03/2017   H/O ileostomy 09/08/2015   Iron deficiency anemia 01/04/2015   Hypomagnesemia 12/26/2014   Atherosclerotic peripheral vascular disease with intermittent claudication ([place]) 10/08/2014   Urinary tract infection 10/06/2014   Rectal cancer ([place]) 08/22/2014   SOB (shortness of breath) 10/01/2013   CAD (coronary artery disease) 09/28/2013   Diabetes ([place]) 09/28/2013   Gout 09/28/2013   HTN (hypertension) 09/28/2013   HLD (hyperlipidemia) 09/28/2013   Diabetes mellitus ([place]) 09/28/2013   PCP:  Bunnie Pion, [place] [facility]:   [facility] [address] [place] - [address] [place] [address] Phone: [phone] Fax: [phone]  [facility] [place], [place] -  [address] [place] [address] Phone: [phone] Fax: [phone]  [place], [place] - [address] [place] [address] Phone: [phone] Fax: [phone]     Social Determinants of Health (SDOH) Social History: SDOH Screenings   Food Insecurity: No Food Insecurity (05/07/2022)  Housing: Low Risk  (05/07/2022)  Transportation Needs: No Transportation Needs (05/07/2022)  Utilities: Not At Risk (05/07/2022)  Tobacco Use: Medium Risk (05/07/2022)   SDOH  Interventions:     Readmission Risk Interventions     No data to display

## 2022-05-08 ENCOUNTER — Inpatient Hospital Stay
Admit: 2022-05-08 | Discharge: 2022-05-08 | Disposition: A | Discharge status: Home / Self Care | Payer: Medicare HMO | Attending: Internal Medicine | Admitting: Internal Medicine

## 2022-05-08 DIAGNOSIS — I5023 Acute on chronic systolic (congestive) heart failure: Secondary | ICD-10-CM | POA: Diagnosis not present

## 2022-05-08 LAB — ECHOCARDIOGRAM COMPLETE
AR max vel: 1.73 cm2
AV Area VTI: 1.63 cm2
AV Area mean vel: 1.98 cm2
AV Mean grad: 2.5 mmHg
AV Peak grad: 5.3 mmHg
Ao pk vel: 1.15 m/s
Area-P 1/2: 3.2 cm2
Calc EF: 16.2 %
Est EF: <20
S' Lateral: 5.5 cm
Single Plane A2C EF: 14.3 %
Single Plane A4C EF: 21.1 %
Weight: 3097.02 oz

## 2022-05-08 LAB — BASIC METABOLIC PANEL
Anion gap: 12 (ref 5–15)
BUN: 23 mg/dL (ref 8–23)
CO2: 25 mmol/L (ref 22–32)
Calcium: 9.1 mg/dL (ref 8.9–10.3)
Chloride: 103 mmol/L (ref 98–111)
Creatinine, Ser: 1.27 mg/dL — ABNORMAL HIGH (ref 0.61–1.24)
GFR, Estimated: >60 mL/min (ref >60)
Glucose, Bld: 85 mg/dL (ref 70–99)
Potassium: 3.3 mmol/L — ABNORMAL LOW (ref 3.5–5.1)
Sodium: 140 mmol/L (ref 135–145)

## 2022-05-08 LAB — CBC
HCT: 32.8 % — ABNORMAL LOW (ref 39.0–52.0)
Hemoglobin: 10.7 g/dL — ABNORMAL LOW (ref 13.0–17.0)
MCH: 26.8 pg (ref 26.0–34.0)
MCHC: 32.6 g/dL (ref 30.0–36.0)
MCV: 82 fL (ref 80.0–100.0)
Platelets: 191 10*3/uL (ref 150–400)
RBC: 4 MIL/uL — ABNORMAL LOW (ref 4.22–5.81)
RDW: 16.7 % — ABNORMAL HIGH (ref 11.5–15.5)
WBC: 5.8 10*3/uL (ref 4.0–10.5)
nRBC: 0 % (ref 0.0–0.2)

## 2022-05-08 LAB — GLUCOSE, CAPILLARY
Glucose-Capillary: 78 mg/dL (ref 70–99)
Glucose-Capillary: 91 mg/dL (ref 70–99)
Glucose-Capillary: 76 mg/dL (ref 70–99)

## 2022-05-08 LAB — MAGNESIUM: Magnesium: 2.1 mg/dL (ref 1.7–2.4)

## 2022-05-08 MED ORDER — PERFLUTREN LIPID MICROSPHERE
1.0000 mL | INTRAVENOUS | Status: AC | PRN
  Administered 2022-05-08: 3 mL via INTRAVENOUS

## 2022-05-08 MED ORDER — POTASSIUM CHLORIDE CRYS ER 20 MEQ PO TBCR
40.0000 meq | EXTENDED_RELEASE_TABLET | Freq: Once | ORAL | Status: AC
  Administered 2022-05-08: 40 meq via ORAL
  Filled 2022-05-08: qty 2

## 2022-05-08 NOTE — Progress Notes (Signed)
  PROGRESS NOTE    [name] JANDREAU [name]  XAJ:[id number] DOB: [birth year] DOA: 05/05/2022 PCP: Bunnie Pion, FNP  252A/252A-AA  LOS: 3 days   Brief hospital course:   Assessment & Plan: [name] Kehr [name] is a 67 y.o. male with medical history significant for DM, HTN, PVD, STEMI 2005 s/p PCI, CKD 3 AA, , history of rectal cancer diagnosed in 08/2014, s/p radiation and chemo, LAR with diverting loop ileostomy followed by ileostomy takedown on 09/08/2015,cardiomyopathy with EF 20% November 2023, started on 11/27 on Coreg 3.125, Entresto half of 25/26 daily, who presents to the ED with dyspnea on exertion and recent weight gain of 35 pounds.  He also has mild lower extremity edema.    * Acute on chronic systolic CHF (congestive heart failure) (HCC) Cardiomyopathy, EF 20% 02/2022 Started on IV Lasix, cardio consulted Plan: --cont IV lasix 40 BID --cont coreg --hold Entresto due to soft BP during diuresing  COVID infection --relatively asymptomatic, however, did present with dyspnea and hypoxia. --CRP low --cont Paxlovid  Acute hypoxemic respiratory failure --ED note documented "Pt' Oxygen saturation drooped down into the mid 80's, pt placed on 2L." --could be due to CHF exacerbation or covid or both --now on room air  CAD (coronary artery disease) History of STEMI 2005 No complaints of chest pain, troponin 20 and EKG nonacute --cont statin  HTN (hypertension) BP soft --cont coreg --hold Entresto due to soft BP while diuresing  Diabetes mellitus, currently not active With hypoglycemia Diet controlled, not on hypoglycemics at home. A1c 5.5. --d/c BG checks  Stage 3a chronic kidney disease ([place]) Renal function at baseline --monitor while diuresing  History of rectal cancer Rectal adenocarcinoma diagnosed in 08/2014, s/p radiation and chemo, LAR with diverting loop ileostomy followed by ileostomy takedown on 09/08/2015  Seen by GI 02/2022  Chronic anemia Stable at  11-12  Abnormal liver function tests Elevated alk phos to the mid 200s, baseline mid 094B etiology uncertain Outpatient follow-up   DVT prophylaxis: Lovenox SQ Code Status: Full code  Family Communication: wife updated at bedside today Level of care: Progressive Dispo:   The patient is from: home Anticipated d/c is to: home Anticipated d/c date is: 1-2 days   Subjective and Interval History:  Pt reported breathing and swelling improved.   Objective: Vitals:   05/08/22 0552 05/08/22 0800 05/08/22 1217 05/08/22 1740  BP:  104/69 97/64 111/84  Pulse:  (!) 58 (!) 53 66  Resp:  '18 18 18  '$ Temp:  97.9 F (36.6 C) 98 F (36.7 C) 98.2 F (36.8 C)  TempSrc:      SpO2:  92% 98% 96%  Weight: 87.8 kg       Intake/Output Summary (Last 24 hours) at 05/08/2022 1843 Last data filed at 05/08/2022 1740 Gross per 24 hour  Intake 240 ml  Output 1000 ml  Net -760 ml   Filed Weights   05/08/22 0552  Weight: 87.8 kg    Examination:   Constitutional: NAD, AAOx3 HEENT: conjunctivae and lids normal, EOMI CV: No cyanosis.   RESP: normal respiratory effort, on RA Extremities: less tense edema in BLE SKIN: warm, dry Neuro: II - XII grossly intact.   Psych: Normal mood and affect.  Appropriate judgement and reason   Data Reviewed: I have personally reviewed labs and imaging studies  Time spent: 35 minutes  Enzo Bi, MD [facility] If 7PM-7AM, please contact night-coverage 05/08/2022, 6:43 PM

## 2022-05-08 NOTE — Progress Notes (Signed)
[facility] Cardiology  SUBJECTIVE: Laying in bed, denies chest pain or shortness of breath   Vitals:   05/08/22 0109 05/08/22 0548 05/08/22 0552 05/08/22 0800  BP: (!) 117/91 115/70  104/69  Pulse: (!) 58 (!) 55  (!) 58  Resp: '18 18  18  '$ Temp: 97.7 F (36.5 C) (!) 97.4 F (36.3 C)  97.9 F (36.6 C)  TempSrc: Oral Oral    SpO2: 100% 100%  92%  Weight:   87.8 kg      Intake/Output Summary (Last 24 hours) at 05/08/2022 1125 Last data filed at 05/08/2022 0800 Gross per 24 hour  Intake --  Output 445 ml  Net -445 ml      PHYSICAL EXAM  General: Well developed, well nourished, in no acute distress HEENT:  Normocephalic and atramatic Neck:  No JVD.  Lungs: Clear bilaterally to auscultation and percussion. Heart: HRRR . Normal S1 and S2 without gallops or murmurs.  Abdomen: Bowel sounds are positive, abdomen soft and non-tender  Msk:  Back normal, normal gait. Normal strength and tone for age. Extremities: No clubbing, cyanosis or edema.   Neuro: Alert and oriented X 3. Psych:  Good affect, responds appropriately   LABS: Basic Metabolic Panel: Recent Labs    05/07/22 0625 05/08/22 0727  NA 139 140  K 3.2* 3.3*  CL 106 103  CO2 21* 25  GLUCOSE 75 85  BUN 22 23  CREATININE 1.40* 1.27*  CALCIUM 8.7* 9.1  MG 1.9 2.1   Liver Function Tests: Recent Labs    05/05/22 1822  AST 28  ALT 21  ALKPHOS 243*  BILITOT 1.1  PROT 6.6  ALBUMIN 4.0   No results for input(s): "LIPASE", "AMYLASE" in the last 72 hours. CBC: Recent Labs    05/05/22 1822 05/06/22 0423 05/07/22 0625 05/08/22 0727  WBC 6.8   < > 5.8 5.8  NEUTROABS 5.4  --   --   --   HGB 11.7*   < > 10.6* 10.7*  HCT 37.6*   < > 33.6* 32.8*  MCV 85.1   < > 84.2 82.0  PLT 227   < > 211 191   < > = values in this interval not displayed.   Cardiac Enzymes: No results for input(s): "CKTOTAL", "CKMB", "CKMBINDEX", "TROPONINI" in the last 72 hours. BNP: Invalid input(s): "POCBNP" D-Dimer: No results for  input(s): "DDIMER" in the last 72 hours. Hemoglobin A1C: Recent Labs    05/06/22 0423  HGBA1C 5.5   Fasting Lipid Panel: No results for input(s): "CHOL", "HDL", "LDLCALC", "TRIG", "CHOLHDL", "LDLDIRECT" in the last 72 hours. Thyroid Function Tests: Recent Labs    05/05/22 1822  TSH 3.965   Anemia Panel: No results for input(s): "VITAMINB12", "FOLATE", "FERRITIN", "TIBC", "IRON", "RETICCTPCT" in the last 72 hours.  No results found.   Echo EF 20% 03/08/2022  TELEMETRY: Sinus rhythm 70 bpm:  ASSESSMENT AND PLAN:  Principal Problem:   Acute on chronic systolic CHF (congestive heart failure) (HCC) Active Problems:   CAD (coronary artery disease)   HTN (hypertension)   Diabetes mellitus (HCC)   Abnormal liver function tests   History of rectal cancer   Stage 3a chronic kidney disease (HCC)   Cardiomyopathy (HCC)   Chronic anemia    1.  Acute on chronic systolic congestive heart failure, HFrEF, EF 20% 03/08/2022, furosemide 40 mg IV twice daily, 24-hour output 445 cc, denies shortness of breath, has persistent increased abdominal girth, oxygen saturation 92% on room air 2.  Coronary artery disease, history of STEMI and PCI 2005, currently no chest pain 3.  COVID infection, on Paxlovid 4.  Chronic kidney disease stage IIIa, BUN and creatinine 23 and 1.27, respectively  Recommendations  1.  Agree with current therapy 2.  Continue diuresis 3.  Carefully monitor renal status 4.  Continue carvedilol 5.  Hold Entresto for now 6.  Follow-up Dr. Clayborn Bigness after discharge   Isaias Cowman, MD, PhD, [facility] 05/08/2022 11:25 AM

## 2022-05-09 DIAGNOSIS — I5023 Acute on chronic systolic (congestive) heart failure: Secondary | ICD-10-CM | POA: Diagnosis not present

## 2022-05-09 LAB — CBC
HCT: 32.1 % — ABNORMAL LOW (ref 39.0–52.0)
Hemoglobin: 10.5 g/dL — ABNORMAL LOW (ref 13.0–17.0)
MCH: 26.8 pg (ref 26.0–34.0)
MCHC: 32.7 g/dL (ref 30.0–36.0)
MCV: 81.9 fL (ref 80.0–100.0)
Platelets: 181 10*3/uL (ref 150–400)
RBC: 3.92 MIL/uL — ABNORMAL LOW (ref 4.22–5.81)
RDW: 16.7 % — ABNORMAL HIGH (ref 11.5–15.5)
WBC: 5 10*3/uL (ref 4.0–10.5)
nRBC: 0 % (ref 0.0–0.2)

## 2022-05-09 LAB — MAGNESIUM: Magnesium: 2 mg/dL (ref 1.7–2.4)

## 2022-05-09 LAB — BASIC METABOLIC PANEL
Anion gap: 7 (ref 5–15)
BUN: 23 mg/dL (ref 8–23)
CO2: 30 mmol/L (ref 22–32)
Calcium: 9 mg/dL (ref 8.9–10.3)
Chloride: 104 mmol/L (ref 98–111)
Creatinine, Ser: 1.36 mg/dL — ABNORMAL HIGH (ref 0.61–1.24)
GFR, Estimated: 57 mL/min — ABNORMAL LOW (ref >60)
Glucose, Bld: 75 mg/dL (ref 70–99)
Potassium: 3.4 mmol/L — ABNORMAL LOW (ref 3.5–5.1)
Sodium: 141 mmol/L (ref 135–145)

## 2022-05-09 MED ORDER — POTASSIUM CHLORIDE CRYS ER 20 MEQ PO TBCR
40.0000 meq | EXTENDED_RELEASE_TABLET | Freq: Every day | ORAL | Status: DC
  Administered 2022-05-09 – 2022-05-13 (×5): 40 meq via ORAL
  Filled 2022-05-09 (×5): qty 2

## 2022-05-09 NOTE — Plan of Care (Signed)

## 2022-05-09 NOTE — Progress Notes (Signed)
PROGRESS NOTE    [name] SKOWRON [name]  JJK:[id number] DOB: 31-Dec-1955 DOA: 05/05/2022 PCP: Bunnie Pion, FNP  252A/252A-AA  LOS: 4 days   Brief hospital course:   Assessment & Plan: [name] Kehr [name] is a 67 y.o. male with medical history significant for DM, HTN, PVD, STEMI 2005 s/p PCI, CKD 3 AA, , history of rectal cancer diagnosed in 08/2014, s/p radiation and chemo, LAR with diverting loop ileostomy followed by ileostomy takedown on 09/08/2015,cardiomyopathy with EF 20% November 2023, started on 11/27 on Coreg 3.125, Entresto half of 25/26 daily, who presents to the ED with dyspnea on exertion and recent weight gain of 35 pounds.  He also has mild lower extremity edema.    * Acute on chronic systolic CHF (congestive heart failure) (HCC) Cardiomyopathy, EF 20% 02/2022 Started on IV Lasix, cardio consulted Plan: --cont IV lasix 40 BID --cont coreg --hold Entresto due to soft BP during diuresing  COVID infection --relatively asymptomatic, however, did present with dyspnea and hypoxia. --CRP low --cont Paxlovid  Acute hypoxemic respiratory failure --ED note documented "Pt' Oxygen saturation drooped down into the mid 80's, pt placed on 2L." --could be due to CHF exacerbation or covid or both --now on room air  CAD (coronary artery disease) History of STEMI 2005 No complaints of chest pain, troponin 20 and EKG nonacute --cont statin  HTN (hypertension) BP soft --cont coreg --hold Entresto due to soft BP while diuresing  Diabetes mellitus, currently not active With hypoglycemia Diet controlled, not on hypoglycemics at home. A1c 5.5. --no need for BG checks  Stage 3a chronic kidney disease ([place]) Renal function at baseline --monitor while diuresing  History of rectal cancer Rectal adenocarcinoma diagnosed in 08/2014, s/p radiation and chemo, LAR with diverting loop ileostomy followed by ileostomy takedown on 09/08/2015  Seen by GI 02/2022  Chronic  anemia Stable at 11-12  Abnormal liver function tests Elevated alk phos to the mid 200s, baseline mid 371I etiology uncertain Outpatient follow-up  Diarrhea --likely due to covid.  Stool was not collected for C diff test, but only 1-2 episodes per day, without fever or leukocytosis, so unlikely to have C diff infection.   DVT prophylaxis: Lovenox SQ Code Status: Full code  Family Communication: wife updated at bedside today Level of care: Progressive Dispo:   The patient is from: home Anticipated d/c is to: home Anticipated d/c date is: 1-2 days   Subjective and Interval History:  Dyspnea and swelling continued to improve.  Pt reported 1-2 episodes of diarrhea since presentation.   Objective: Vitals:   05/09/22 0516 05/09/22 0742 05/09/22 1230 05/09/22 1555  BP: 100/67 1'16/84 97/81 90/64 '$  Pulse: 63 (!) 51 (!) 56 62  Resp: '19 18 18 19  '$ Temp: (!) 97.4 F (36.3 C) 97.8 F (36.6 C) 97.9 F (36.6 C) 98.3 F (36.8 C)  TempSrc:  Oral Oral Oral  SpO2: 98% 99% 100% 94%  Weight:        Intake/Output Summary (Last 24 hours) at 05/09/2022 1709 Last data filed at 05/09/2022 1230 Gross per 24 hour  Intake 240 ml  Output 2100 ml  Net -1860 ml   Filed Weights   05/08/22 0552  Weight: 87.8 kg    Examination:   Constitutional: NAD, AAOx3 HEENT: conjunctivae and lids normal, EOMI CV: No cyanosis.   RESP: normal respiratory effort, on RA Extremities: edema in BLE less tense SKIN: warm, dry Neuro: II - XII grossly intact.   Psych: Normal mood and affect.  Appropriate judgement and reason   Data Reviewed: I have personally reviewed labs and imaging studies  Time spent: 35 minutes  Enzo Bi, MD [facility] If 7PM-7AM, please contact night-coverage 05/09/2022, 5:09 PM

## 2022-05-09 NOTE — Progress Notes (Signed)
[facility] Cardiology  SUBJECTIVE: Patient laying in bed, denies chest pain or shortness of breath   Vitals:   05/08/22 2122 05/09/22 0015 05/09/22 0516 05/09/22 0742  BP: 99/70 112/83 100/67 116/84  Pulse: (!) 58 63 63 (!) 51  Resp: 16 (!) '21 19 18  '$ Temp: 97.7 F (36.5 C) 97.7 F (36.5 C) (!) 97.4 F (36.3 C) 97.8 F (36.6 C)  TempSrc:    Oral  SpO2: 100% 94% 98% 99%  Weight:         Intake/Output Summary (Last 24 hours) at 05/09/2022 0920 Last data filed at 05/09/2022 1025 Gross per 24 hour  Intake 480 ml  Output 1850 ml  Net -1370 ml      PHYSICAL EXAM  General: Well developed, well nourished, in no acute distress HEENT:  Normocephalic and atramatic Neck:  No JVD.  Lungs: Clear bilaterally to auscultation and percussion. Heart: HRRR . Normal S1 and S2 without gallops or murmurs.  Abdomen: Bowel sounds are positive, abdomen soft and non-tender  Msk:  Back normal, normal gait. Normal strength and tone for age. Extremities: No clubbing, cyanosis or edema.   Neuro: Alert and oriented X 3. Psych:  Good affect, responds appropriately   LABS: Basic Metabolic Panel: Recent Labs    05/08/22 0727 05/09/22 0653  NA 140 141  K 3.3* 3.4*  CL 103 104  CO2 25 30  GLUCOSE 85 75  BUN 23 23  CREATININE 1.27* 1.36*  CALCIUM 9.1 9.0  MG 2.1 2.0   Liver Function Tests: No results for input(s): "AST", "ALT", "ALKPHOS", "BILITOT", "PROT", "ALBUMIN" in the last 72 hours. No results for input(s): "LIPASE", "AMYLASE" in the last 72 hours. CBC: Recent Labs    05/08/22 0727 05/09/22 0653  WBC 5.8 5.0  HGB 10.7* 10.5*  HCT 32.8* 32.1*  MCV 82.0 81.9  PLT 191 181   Cardiac Enzymes: No results for input(s): "CKTOTAL", "CKMB", "CKMBINDEX", "TROPONINI" in the last 72 hours. BNP: Invalid input(s): "POCBNP" D-Dimer: No results for input(s): "DDIMER" in the last 72 hours. Hemoglobin A1C: No results for input(s): "HGBA1C" in the last 72 hours. Fasting Lipid Panel: No results  for input(s): "CHOL", "HDL", "LDLCALC", "TRIG", "CHOLHDL", "LDLDIRECT" in the last 72 hours. Thyroid Function Tests: No results for input(s): "TSH", "T4TOTAL", "T3FREE", "THYROIDAB" in the last 72 hours.  Invalid input(s): "FREET3" Anemia Panel: No results for input(s): "VITAMINB12", "FOLATE", "FERRITIN", "TIBC", "IRON", "RETICCTPCT" in the last 72 hours.  ECHOCARDIOGRAM COMPLETE  Result Date: 05/08/2022    ECHOCARDIOGRAM REPORT   Patient Name:   [name] [name] [name] Date of Exam: 05/08/2022 Medical Rec #:  [id number]             Height:       66.0 in Accession #:    [id number]            Weight:       193.6 lb Date of Birth:  November 27, 1955             BSA:          1.972 m Patient Age:    67 years              BP:           117/91 mmHg Patient Gender: M                     HR:           50 bpm. Exam Location:  [place]  Procedure: 2D Echo, Color Doppler, Cardiac Doppler and Intracardiac            Opacification Agent Indications:     Pericardial Effusion  History:         Patient has prior history of Echocardiogram examinations.                  Previous Myocardial Infarction and CAD; Risk                  Factors:Hypertension and Diabetes.  Sonographer:     L. Thornton-Maynard Referring Phys:  [id number] Karma Greaser D CALLWOOD Diagnosing Phys: Isaias Cowman MD IMPRESSIONS  1. Left ventricular ejection fraction, by estimation, is <20%. The left ventricle has severely decreased function. The left ventricle demonstrates regional wall motion abnormalities (see scoring diagram/findings for description). The left ventricular internal cavity size was moderately to severely dilated. Left ventricular diastolic parameters were normal.  2. Right ventricular systolic function is normal. The right ventricular size is normal. There is normal pulmonary artery systolic pressure.  3. Moderate pleural effusion in the left lateral region.  4. The mitral valve is normal in structure. Moderate mitral valve regurgitation. No evidence of  mitral stenosis.  5. Tricuspid valve regurgitation is moderate.  6. The aortic valve is normal in structure. Aortic valve regurgitation is not visualized. No aortic stenosis is present.  7. The inferior vena cava is normal in size with greater than 50% respiratory variability, suggesting right atrial pressure of 3 mmHg. FINDINGS  Left Ventricle: Left ventricular ejection fraction, by estimation, is <20%. The left ventricle has severely decreased function. The left ventricle demonstrates regional wall motion abnormalities. Definity contrast agent was given IV to delineate the left ventricular endocardial borders. The left ventricular internal cavity size was moderately to severely dilated. There is no left ventricular hypertrophy. Left ventricular diastolic parameters were normal.  LV Wall Scoring: The inferior septum, basal anteroseptal segment, basal inferior segment, and apex are akinetic. Right Ventricle: The right ventricular size is normal. No increase in right ventricular wall thickness. Right ventricular systolic function is normal. There is normal pulmonary artery systolic pressure. The tricuspid regurgitant velocity is 2.41 m/s, and  with an assumed right atrial pressure of 3 mmHg, the estimated right ventricular systolic pressure is 24.2 mmHg. Left Atrium: Left atrial size was normal in size. Right Atrium: Right atrial size was normal in size. Pericardium: Trivial pericardial effusion is present. Mitral Valve: The mitral valve is normal in structure. Moderate mitral valve regurgitation. No evidence of mitral valve stenosis. Tricuspid Valve: The tricuspid valve is normal in structure. Tricuspid valve regurgitation is moderate . No evidence of tricuspid stenosis. Aortic Valve: The aortic valve is normal in structure. Aortic valve regurgitation is not visualized. No aortic stenosis is present. Aortic valve mean gradient measures 2.5 mmHg. Aortic valve peak gradient measures 5.3 mmHg. Aortic valve area, by VTI  measures 1.63 cm. Pulmonic Valve: The pulmonic valve was normal in structure. Pulmonic valve regurgitation is not visualized. No evidence of pulmonic stenosis. Aorta: The aortic root is normal in size and structure. Venous: The inferior vena cava is normal in size with greater than 50% respiratory variability, suggesting right atrial pressure of 3 mmHg. IAS/Shunts: No atrial level shunt detected by color flow Doppler. Additional Comments: There is a moderate pleural effusion in the left lateral region.  LEFT VENTRICLE PLAX 2D LVIDd:         6.40 cm      Diastology LVIDs:  5.50 cm      LV e' medial:    1.80 cm/s LV PW:         0.90 cm      LV E/e' medial:  42.5 LV IVS:        1.00 cm      LV e' lateral:   2.40 cm/s LVOT diam:     2.30 cm      LV E/e' lateral: 31.9 LV SV:         33 LV SV Index:   17 LVOT Area:     4.15 cm  LV Volumes (MOD) LV vol d, MOD A2C: 230.0 ml LV vol d, MOD A4C: 232.0 ml LV vol s, MOD A2C: 197.0 ml LV vol s, MOD A4C: 183.0 ml LV SV MOD A2C:     33.0 ml LV SV MOD A4C:     232.0 ml LV SV MOD BP:      38.2 ml RIGHT VENTRICLE RV S prime:     7.56 cm/s TAPSE (M-mode): 1.0 cm LEFT ATRIUM              Index        RIGHT ATRIUM           Index LA Vol (A2C):   91.7 ml  46.50 ml/m  RA Area:     32.20 cm LA Vol (A4C):   136.0 ml 68.96 ml/m  RA Volume:   138.00 ml 69.97 ml/m LA Biplane Vol: 114.0 ml 57.80 ml/m  AORTIC VALVE                    PULMONIC VALVE AV Area (Vmax):    1.73 cm     PV Vmax:          0.72 m/s AV Area (Vmean):   1.98 cm     PV Peak grad:     2.1 mmHg AV Area (VTI):     1.63 cm     PR End Diast Vel: 5.43 msec AV Vmax:           115.00 cm/s AV Vmean:          73.150 cm/s AV VTI:            0.204 m AV Peak Grad:      5.3 mmHg AV Mean Grad:      2.5 mmHg LVOT Vmax:         47.90 cm/s LVOT Vmean:        34.900 cm/s LVOT VTI:          0.080 m LVOT/AV VTI ratio: 0.39  AORTA Ao Root diam: 3.60 cm Ao Asc diam:  3.50 cm MITRAL VALVE               TRICUSPID VALVE MV Area (PHT):  3.20 cm    TR Peak grad:   23.2 mmHg MV Decel Time: 237 msec    TR Vmax:        241.00 cm/s MV E velocity: 76.50 cm/s                            SHUNTS                            Systemic VTI:  0.08 m  Systemic Diam: 2.30 cm Isaias Cowman MD Electronically signed by Isaias Cowman MD Signature Date/Time: 05/08/2022/1:31:46 PM    Final      Echo EF 20% 03/08/2022  TELEMETRY: Sinus rhythm 68 bpm:  ASSESSMENT AND PLAN:  Principal Problem:   Acute on chronic systolic CHF (congestive heart failure) (HCC) Active Problems:   CAD (coronary artery disease)   HTN (hypertension)   Diabetes mellitus (HCC)   Abnormal liver function tests   History of rectal cancer   Stage 3a chronic kidney disease (HCC)   Cardiomyopathy (HCC)   Chronic anemia    1. Acute on chronic systolic congestive heart failure, HFrEF, EF 20% 03/08/2022, furosemide 40 mg IV twice daily, 24-hour output 445 cc, denies shortness of breath, has persistent increased abdominal girth, oxygen saturation 92% on room air 2.  Coronary artery disease, history of STEMI and PCI 2005, currently no chest pain 3.  COVID infection, on Paxlovid 4.  Chronic kidney disease stage IIIa, BUN and creatinine 23 and 1.27, respectively   Recommendations   1.  Agree with current therapy 2.  Continue diuresis 3.  Carefully monitor renal status 4.  Continue carvedilol 5.  Hold Entresto for now 6.  Follow-up Dr. Clayborn Bigness after discharge     Isaias Cowman, MD, PhD, [facility] 05/09/2022 9:20 AM

## 2022-05-10 DIAGNOSIS — I5023 Acute on chronic systolic (congestive) heart failure: Secondary | ICD-10-CM | POA: Diagnosis not present

## 2022-05-10 LAB — CBC
HCT: 34.1 % — ABNORMAL LOW (ref 39.0–52.0)
Hemoglobin: 11 g/dL — ABNORMAL LOW (ref 13.0–17.0)
MCH: 26.6 pg (ref 26.0–34.0)
MCHC: 32.3 g/dL (ref 30.0–36.0)
MCV: 82.6 fL (ref 80.0–100.0)
Platelets: 204 10*3/uL (ref 150–400)
RBC: 4.13 MIL/uL — ABNORMAL LOW (ref 4.22–5.81)
RDW: 16.9 % — ABNORMAL HIGH (ref 11.5–15.5)
WBC: 5.8 10*3/uL (ref 4.0–10.5)
nRBC: 0 % (ref 0.0–0.2)

## 2022-05-10 LAB — BASIC METABOLIC PANEL
Anion gap: 10 (ref 5–15)
BUN: 23 mg/dL (ref 8–23)
CO2: 31 mmol/L (ref 22–32)
Calcium: 9.3 mg/dL (ref 8.9–10.3)
Chloride: 100 mmol/L (ref 98–111)
Creatinine, Ser: 1.36 mg/dL — ABNORMAL HIGH (ref 0.61–1.24)
GFR, Estimated: 57 mL/min — ABNORMAL LOW (ref >60)
Glucose, Bld: 72 mg/dL (ref 70–99)
Potassium: 3.6 mmol/L (ref 3.5–5.1)
Sodium: 141 mmol/L (ref 135–145)

## 2022-05-10 LAB — MAGNESIUM: Magnesium: 2 mg/dL (ref 1.7–2.4)

## 2022-05-10 MED ORDER — SPIRONOLACTONE 12.5 MG HALF TABLET
12.5000 mg | ORAL_TABLET | Freq: Every day | ORAL | Status: DC
  Administered 2022-05-10 – 2022-05-13 (×4): 12.5 mg via ORAL
  Filled 2022-05-10 (×4): qty 1

## 2022-05-10 MED ORDER — ORAL CARE MOUTH RINSE: 15.0000 mL | OROMUCOSAL | Status: DC | PRN

## 2022-05-10 NOTE — Progress Notes (Addendum)
[facility] Cardiology  Patient ID: TAMAJ JURGENS [name] MRN: [id number] DOB/AGE: May 17, 1955 67 y.o.   Admit date: 05/05/2022 Referring Physician Dr. Enzo Bi  Primary Physician [name], NP Primary Cardiologist [facility] Reason for Consultation acute on chronic congestive heart failure  HPI: Dmitri Pettigrew. Espindola [name] Is a 56yoM with a PMH of HFrEF (<20%, severely decreased LV function with inf/basal anteroseptal, basal inf and apex akinesis), CAD with hx STEMI 2005 s/p PCI, hx rectal cancer s/p XRT and chemo, hx ileostomy s/p takedown 08/2015 who presented to [facility] ED 05/05/2022 with a 35 pound weight gain and dyspnea on exertion.  He was COVID-positive on presentation.  Diuresing well on IV Lasix twice daily.  Interval History: -Feels okay, still having intermittent diarrhea -Feels as though his abdominal distention is improving but not at baseline.  No chest pain or shortness of breath -In sinus bradycardia to sinus rhythm on telemetry, heart rate low of 56 bpm   Vitals:   05/10/22 0052 05/10/22 0402 05/10/22 0844 05/10/22 1017  BP: 112/82 117/86 110/77   Pulse: (!) 57 (!) 55 (!) 57   Resp: '16 20 18   '$ Temp: 98.4 F (36.9 C) 98.4 F (36.9 C) 97.7 F (36.5 C)   TempSrc:   Oral   SpO2: 100% 100% 99%   Weight:      Height:    '5\' 9"'$  (1.753 m)     Intake/Output Summary (Last 24 hours) at 05/10/2022 1019 Last data filed at 05/10/2022 0900 Gross per 24 hour  Intake 240 ml  Output 1400 ml  Net -1160 ml     PHYSICAL EXAM General: Pleasant black male, well nourished, in no acute distress.  Sitting on edge of hospital bed. HEENT:  Normocephalic and atraumatic. Neck:   No JVD.  Lungs: Normal respiratory effort on room air.  Clear to ascultation bilaterally. Heart: HRRR . Normal S1 and S2 without gallops or murmurs. Abdomen: Mildly-distended appearing, nontender to palpation.  Msk: Normal strength and tone for age. Extremities: No clubbing, cyanosis.1+ bilateral lower extremity edema with  chronic venous stasis hyperpigmentation.  Neuro: Alert and oriented x3 Psych:  mood appropriate for situation.     LABS: Basic Metabolic Panel: Recent Labs    05/09/22 0653 05/10/22 0548  NA 141 141  K 3.4* 3.6  CL 104 100  CO2 30 31  GLUCOSE 75 72  BUN 23 23  CREATININE 1.36* 1.36*  CALCIUM 9.0 9.3  MG 2.0 2.0    Liver Function Tests: No results for input(s): "AST", "ALT", "ALKPHOS", "BILITOT", "PROT", "ALBUMIN" in the last 72 hours. No results for input(s): "LIPASE", "AMYLASE" in the last 72 hours. CBC: Recent Labs    05/09/22 0653 05/10/22 0548  WBC 5.0 5.8  HGB 10.5* 11.0*  HCT 32.1* 34.1*  MCV 81.9 82.6  PLT 181 204    Cardiac Enzymes: No results for input(s): "CKTOTAL", "CKMB", "CKMBINDEX", "TROPONINI" in the last 72 hours. BNP: Invalid input(s): "POCBNP" D-Dimer: No results for input(s): "DDIMER" in the last 72 hours. Hemoglobin A1C: No results for input(s): "HGBA1C" in the last 72 hours. Fasting Lipid Panel: No results for input(s): "CHOL", "HDL", "LDLCALC", "TRIG", "CHOLHDL", "LDLDIRECT" in the last 72 hours. Thyroid Function Tests: No results for input(s): "TSH", "T4TOTAL", "T3FREE", "THYROIDAB" in the last 72 hours.  Invalid input(s): "FREET3" Anemia Panel: No results for input(s): "VITAMINB12", "FOLATE", "FERRITIN", "TIBC", "IRON", "RETICCTPCT" in the last 72 hours.  ECHOCARDIOGRAM COMPLETE  Result Date: 05/08/2022    ECHOCARDIOGRAM REPORT   Patient Name:  Zannie Kehr [name] Date of Exam: 05/08/2022 Medical Rec #:  [id number]             Height:       66.0 in Accession #:    [id number]            Weight:       193.6 lb Date of Birth:  [birth year]             BSA:          1.972 m Patient Age:    60 years              BP:           117/91 mmHg Patient Gender: M                     HR:           50 bpm. Exam Location:  [facility] Procedure: 2D Echo, Color Doppler, Cardiac Doppler and Intracardiac            Opacification Agent Indications:     Pericardial  Effusion  History:         Patient has prior history of Echocardiogram examinations.                  Previous Myocardial Infarction and CAD; Risk                  Factors:Hypertension and Diabetes.  Sonographer:     L. Thornton-Maynard Referring Phys:  [id number] Karma Greaser D CALLWOOD Diagnosing Phys: Isaias Cowman MD IMPRESSIONS  1. Left ventricular ejection fraction, by estimation, is <20%. The left ventricle has severely decreased function. The left ventricle demonstrates regional wall motion abnormalities (see scoring diagram/findings for description). The left ventricular internal cavity size was moderately to severely dilated. Left ventricular diastolic parameters were normal.  2. Right ventricular systolic function is normal. The right ventricular size is normal. There is normal pulmonary artery systolic pressure.  3. Moderate pleural effusion in the left lateral region.  4. The mitral valve is normal in structure. Moderate mitral valve regurgitation. No evidence of mitral stenosis.  5. Tricuspid valve regurgitation is moderate.  6. The aortic valve is normal in structure. Aortic valve regurgitation is not visualized. No aortic stenosis is present.  7. The inferior vena cava is normal in size with greater than 50% respiratory variability, suggesting right atrial pressure of 3 mmHg. FINDINGS  Left Ventricle: Left ventricular ejection fraction, by estimation, is <20%. The left ventricle has severely decreased function. The left ventricle demonstrates regional wall motion abnormalities. Definity contrast agent was given IV to delineate the left ventricular endocardial borders. The left ventricular internal cavity size was moderately to severely dilated. There is no left ventricular hypertrophy. Left ventricular diastolic parameters were normal.  LV Wall Scoring: The inferior septum, basal anteroseptal segment, basal inferior segment, and apex are akinetic. Right Ventricle: The right ventricular size is normal. No  increase in right ventricular wall thickness. Right ventricular systolic function is normal. There is normal pulmonary artery systolic pressure. The tricuspid regurgitant velocity is 2.41 m/s, and  with an assumed right atrial pressure of 3 mmHg, the estimated right ventricular systolic pressure is 64.4 mmHg. Left Atrium: Left atrial size was normal in size. Right Atrium: Right atrial size was normal in size. Pericardium: Trivial pericardial effusion is present. Mitral Valve: The mitral valve is normal in structure. Moderate mitral valve regurgitation. No evidence of mitral valve stenosis. Tricuspid Valve:  The tricuspid valve is normal in structure. Tricuspid valve regurgitation is moderate . No evidence of tricuspid stenosis. Aortic Valve: The aortic valve is normal in structure. Aortic valve regurgitation is not visualized. No aortic stenosis is present. Aortic valve mean gradient measures 2.5 mmHg. Aortic valve peak gradient measures 5.3 mmHg. Aortic valve area, by VTI measures 1.63 cm. Pulmonic Valve: The pulmonic valve was normal in structure. Pulmonic valve regurgitation is not visualized. No evidence of pulmonic stenosis. Aorta: The aortic root is normal in size and structure. Venous: The inferior vena cava is normal in size with greater than 50% respiratory variability, suggesting right atrial pressure of 3 mmHg. IAS/Shunts: No atrial level shunt detected by color flow Doppler. Additional Comments: There is a moderate pleural effusion in the left lateral region.  LEFT VENTRICLE PLAX 2D LVIDd:         6.40 cm      Diastology LVIDs:         5.50 cm      LV e' medial:    1.80 cm/s LV PW:         0.90 cm      LV E/e' medial:  42.5 LV IVS:        1.00 cm      LV e' lateral:   2.40 cm/s LVOT diam:     2.30 cm      LV E/e' lateral: 31.9 LV SV:         33 LV SV Index:   17 LVOT Area:     4.15 cm  LV Volumes (MOD) LV vol d, MOD A2C: 230.0 ml LV vol d, MOD A4C: 232.0 ml LV vol s, MOD A2C: 197.0 ml LV vol s, MOD A4C:  183.0 ml LV SV MOD A2C:     33.0 ml LV SV MOD A4C:     232.0 ml LV SV MOD BP:      38.2 ml RIGHT VENTRICLE RV S prime:     7.56 cm/s TAPSE (M-mode): 1.0 cm LEFT ATRIUM              Index        RIGHT ATRIUM           Index LA Vol (A2C):   91.7 ml  46.50 ml/m  RA Area:     32.20 cm LA Vol (A4C):   136.0 ml 68.96 ml/m  RA Volume:   138.00 ml 69.97 ml/m LA Biplane Vol: 114.0 ml 57.80 ml/m  AORTIC VALVE                    PULMONIC VALVE AV Area (Vmax):    1.73 cm     PV Vmax:          0.72 m/s AV Area (Vmean):   1.98 cm     PV Peak grad:     2.1 mmHg AV Area (VTI):     1.63 cm     PR End Diast Vel: 5.43 msec AV Vmax:           115.00 cm/s AV Vmean:          73.150 cm/s AV VTI:            0.204 m AV Peak Grad:      5.3 mmHg AV Mean Grad:      2.5 mmHg LVOT Vmax:         47.90 cm/s LVOT Vmean:        34.900 cm/s  LVOT VTI:          0.080 m LVOT/AV VTI ratio: 0.39  AORTA Ao Root diam: 3.60 cm Ao Asc diam:  3.50 cm MITRAL VALVE               TRICUSPID VALVE MV Area (PHT): 3.20 cm    TR Peak grad:   23.2 mmHg MV Decel Time: 237 msec    TR Vmax:        241.00 cm/s MV E velocity: 76.50 cm/s                            SHUNTS                            Systemic VTI:  0.08 m                            Systemic Diam: 2.30 cm Isaias Cowman MD Electronically signed by Isaias Cowman MD Signature Date/Time: 05/08/2022/1:31:46 PM    Final      Echo EF 20% 03/08/2022  TELEMETRY: Sinus bradycardia to sinus rhythm rate 56-60s  ASSESSMENT AND PLAN:  Principal Problem:   Acute on chronic systolic CHF (congestive heart failure) (HCC) Active Problems:   CAD (coronary artery disease)   HTN (hypertension)   Diabetes mellitus (HCC)   Abnormal liver function tests   History of rectal cancer   Stage 3a chronic kidney disease (HCC)   Cardiomyopathy (HCC)   Chronic anemia    1. Acute on chronic systolic congestive heart failure, HFrEF, EF 20% 03/08/2022, furosemide 40 mg IV twice daily, has persistent  increased abdominal girth, oxygen saturation 92% on room air.  -Still somewhat hypervolemic appearing with abdominal distention and 1+ peripheral edema. Net IO Since Admission: -4,625 mL [05/10/22 1143]  2.  Coronary artery disease, history of STEMI and PCI 2005, currently no chest pain 3.  COVID infection, on Paxlovid 4.  Chronic kidney disease stage IIIa, BUN and creatinine 23 and 1.27, respectively   Recommendations   1.  Agree with current therapy 2.  Continue diuresis with IV Lasix 40 mg twice daily 3.  Carefully monitor renal status 4.  Hold carvedilol with relative bradycardia 5.  Hold Entresto for now 6.  Add low-dose spironolactone 12.5 mg once daily, uptitrate as BP allows 7.  Plan to escalate GDMT with SGLT2i. 8.  Follow-up Dr. Clayborn Bigness after discharge for consideration of primary prevention ICD   This patient's plan of care was discussed and created with Dr. Saralyn Pilar  and he is in agreement.    [name], PA-C 05/10/2022 10:19 AM

## 2022-05-10 NOTE — Progress Notes (Signed)
PROGRESS NOTE    [name] PINN [name]  GQQ:[id number] DOB: August 06, 1955 DOA: 05/05/2022 PCP: Bunnie Pion, FNP  252A/252A-AA  LOS: 5 days   Brief hospital course:   Assessment & Plan: [name] Kehr [name] is a 67 y.o. male with medical history significant for DM, HTN, PVD, STEMI 2005 s/p PCI, CKD 3 AA, , history of rectal cancer diagnosed in 08/2014, s/p radiation and chemo, LAR with diverting loop ileostomy followed by ileostomy takedown on 09/08/2015,cardiomyopathy with EF 20% November 2023, started on 11/27 on Coreg 3.125, Entresto half of 25/26 daily, who presents to the ED with dyspnea on exertion and recent weight gain of 35 pounds.  He also has mild lower extremity edema.    * Acute on chronic systolic CHF (congestive heart failure) (HCC) Cardiomyopathy, EF 20% 02/2022 Started on IV Lasix, cardio consulted Plan: --cont IV lasix 40 BID --hold home coreg due to hypotension and bradycardia --hold Entresto due to soft BP during diuresing --start aldactone 12.5 mg daily today --Plan to escalate GDMT with SGLT2i. --Follow-up Dr. Clayborn Bigness after discharge for consideration of primary prevention ICD  COVID infection --relatively asymptomatic, however, did present with dyspnea and hypoxia. --CRP low --cont paxlovid  Acute hypoxemic respiratory failure --ED note documented "Pt' Oxygen saturation drooped down into the mid 80's, pt placed on 2L." --could be due to CHF exacerbation or covid or both --now on room air  CAD (coronary artery disease) History of STEMI 2005 No complaints of chest pain, troponin 20 and EKG nonacute --cont statin  HTN (hypertension) BP soft --hold coreg due to hypotension and bradycardia --hold Entresto due to soft BP while diuresing  Diabetes mellitus, currently not active With hypoglycemia Diet controlled, not on hypoglycemics at home. A1c 5.5. --no need for BG checks  Stage 3a chronic kidney disease ([place]) Renal function at  baseline --monitor while diuresing  History of rectal cancer Rectal adenocarcinoma diagnosed in 08/2014, s/p radiation and chemo, LAR with diverting loop ileostomy followed by ileostomy takedown on 09/08/2015  Seen by GI 02/2022  Chronic anemia Stable at 11-12  Abnormal liver function tests Elevated alk phos to the mid 200s, baseline mid 671I etiology uncertain Outpatient follow-up  Diarrhea --likely due to covid.  Stool was not collected for C diff test, but only 1-2 episodes per day, without fever or leukocytosis, so unlikely to have C diff infection.  Hypokalemia --likely due to diuresis --monitor and replete PRN   DVT prophylaxis: Lovenox SQ Code Status: Full code  Family Communication: wife updated at bedside today Level of care: Progressive Dispo:   The patient is from: home Anticipated d/c is to: home Anticipated d/c date is: 1-2 days   Subjective and Interval History:  Abdominal girth continues to decrease.  BP soft, so home coreg held.   Objective: Vitals:   05/10/22 0402 05/10/22 0844 05/10/22 1017 05/10/22 1308  BP: 117/86 110/77  100/75  Pulse: (!) 55 (!) 57  69  Resp: '20 18  18  '$ Temp: 98.4 F (36.9 C) 97.7 F (36.5 C)  98.7 F (37.1 C)  TempSrc:  Oral    SpO2: 100% 99%  100%  Weight:      Height:   '5\' 9"'$  (1.753 m)     Intake/Output Summary (Last 24 hours) at 05/10/2022 1929 Last data filed at 05/10/2022 1700 Gross per 24 hour  Intake 480 ml  Output 1250 ml  Net -770 ml   Filed Weights   05/08/22 0552  Weight: 87.8 kg  Examination:   Constitutional: NAD, AAOx3 HEENT: conjunctivae and lids normal, EOMI CV: No cyanosis.   RESP: normal respiratory effort, on RA Extremities: edema in BLE less tense SKIN: warm, dry Neuro: II - XII grossly intact.   Psych: Normal mood and affect.  Appropriate judgement and reason   Data Reviewed: I have personally reviewed labs and imaging studies  Time spent: 35 minutes  Enzo Bi, MD [facility] If 7PM-7AM, please contact night-coverage 05/10/2022, 7:29 PM

## 2022-05-11 ENCOUNTER — Other Ambulatory Visit (HOSPITAL_COMMUNITY): Payer: Self-pay

## 2022-05-11 ENCOUNTER — Encounter: Payer: Medicare HMO | Admitting: Family

## 2022-05-11 DIAGNOSIS — I5023 Acute on chronic systolic (congestive) heart failure: Secondary | ICD-10-CM | POA: Diagnosis not present

## 2022-05-11 LAB — BASIC METABOLIC PANEL
Anion gap: 10 (ref 5–15)
BUN: 24 mg/dL — ABNORMAL HIGH (ref 8–23)
CO2: 29 mmol/L (ref 22–32)
Calcium: 8.7 mg/dL — ABNORMAL LOW (ref 8.9–10.3)
Chloride: 100 mmol/L (ref 98–111)
Creatinine, Ser: 1.16 mg/dL (ref 0.61–1.24)
GFR, Estimated: >60 mL/min (ref >60)
Glucose, Bld: 76 mg/dL (ref 70–99)
Potassium: 3.5 mmol/L (ref 3.5–5.1)
Sodium: 139 mmol/L (ref 135–145)

## 2022-05-11 LAB — CBC
HCT: 32.1 % — ABNORMAL LOW (ref 39.0–52.0)
Hemoglobin: 10.5 g/dL — ABNORMAL LOW (ref 13.0–17.0)
MCH: 26.8 pg (ref 26.0–34.0)
MCHC: 32.7 g/dL (ref 30.0–36.0)
MCV: 81.9 fL (ref 80.0–100.0)
Platelets: 171 10*3/uL (ref 150–400)
RBC: 3.92 MIL/uL — ABNORMAL LOW (ref 4.22–5.81)
RDW: 16.7 % — ABNORMAL HIGH (ref 11.5–15.5)
WBC: 5.8 10*3/uL (ref 4.0–10.5)
nRBC: 0 % (ref 0.0–0.2)

## 2022-05-11 LAB — MAGNESIUM: Magnesium: 1.8 mg/dL (ref 1.7–2.4)

## 2022-05-11 MED ORDER — METOLAZONE 5 MG PO TABS
5.0000 mg | ORAL_TABLET | Freq: Once | ORAL | Status: AC
  Administered 2022-05-11: 5 mg via ORAL
  Filled 2022-05-11: qty 1

## 2022-05-11 NOTE — TOC Benefit Eligibility Note (Signed)
Patient Teacher, English as a foreign language completed.    The patient is currently admitted and upon discharge could be taking Jardiance 10 mg.  The current 30 day co-pay is $45.00.   The patient is insured through [place], [place] Patient Advocate Specialist [place] Patient Advocate Team Direct Number: [phone]  Fax: [phone]

## 2022-05-11 NOTE — Progress Notes (Signed)
PROGRESS NOTE    [name] SCHRIEBER [name]  YSA:[id number] DOB: 09/22/1955 DOA: 05/05/2022 PCP: Bunnie Pion, FNP  252A/252A-AA  LOS: 6 days   Brief hospital course:   Assessment & Plan: [name] Kehr [name] is a 68 y.o. male with medical history significant for DM, HTN, PVD, STEMI 2005 s/p PCI, CKD 3 AA, , history of rectal cancer diagnosed in 08/2014, s/p radiation and chemo, LAR with diverting loop ileostomy followed by ileostomy takedown on 09/08/2015,cardiomyopathy with EF 20% November 2023, started on 11/27 on Coreg 3.125, Entresto half of 25/26 daily, who presents to the ED with dyspnea on exertion and recent weight gain of 35 pounds.  He also has mild lower extremity edema.    * Acute on chronic systolic CHF (congestive heart failure) (HCC) Cardiomyopathy, EF 20% 02/2022 Started on IV Lasix, cardio consulted Plan: --cont IV lasix 40 BID --add metolazone today --hold home coreg and Entresto due to hypotension and bradycardia --cont aldactone 12.5 mg daily (new) --Plan to escalate GDMT with SGLT2i. --Follow-up Dr. Clayborn Bigness after discharge for consideration of primary prevention ICD  COVID infection --relatively asymptomatic, however, did present with dyspnea and hypoxia. --CRP low --completed paxlovid  Acute hypoxemic respiratory failure --ED note documented "Pt' Oxygen saturation drooped down into the mid 80's, pt placed on 2L." --could be due to CHF exacerbation or covid or both --now on room air  CAD (coronary artery disease) History of STEMI 2005 No complaints of chest pain, troponin 20 and EKG nonacute --cont statin  HTN (hypertension) BP soft --hold coreg and Entresto due to hypotension and bradycardia --cont diuresis  Diabetes mellitus, currently not active With hypoglycemia Diet controlled, not on hypoglycemics at home. A1c 5.5. --no need for BG checks  Stage 3a chronic kidney disease ([place]) Renal function at baseline --monitor while  diuresing  History of rectal cancer Rectal adenocarcinoma diagnosed in 08/2014, s/p radiation and chemo, LAR with diverting loop ileostomy followed by ileostomy takedown on 09/08/2015  Seen by GI 02/2022  Chronic anemia Stable at 11-12  Abnormal liver function tests Elevated alk phos to the mid 200s, baseline mid 323F etiology uncertain Outpatient follow-up  Diarrhea --likely due to covid.  Stool was not collected for C diff test, but only 1-2 episodes per day, without fever or leukocytosis, so unlikely to have C diff infection.  Hypokalemia --likely due to diuresis --monitor and replete PRN   DVT prophylaxis: Lovenox SQ Code Status: Full code  Family Communication:  Level of care: Progressive Dispo:   The patient is from: home Anticipated d/c is to: home Anticipated d/c date is: 1-2 days.  On IV lasix, duration and timing of discharge to be determined by cardiology.   Subjective and Interval History:  No complaint today.  Urine output hasn't seem to be robust, so metolazone added today.   Objective: Vitals:   05/11/22 0014 05/11/22 0314 05/11/22 0740 05/11/22 1049  BP: 1'21/87 98/69 95/65 '$   Pulse: 64 (!) 59 63   Resp: '18 20 18   '$ Temp: 98.8 F (37.1 C) 98.5 F (36.9 C) 97.9 F (36.6 C)   TempSrc: Oral     SpO2: 98% 100% 96%   Weight:    82 kg  Height:        Intake/Output Summary (Last 24 hours) at 05/11/2022 1656 Last data filed at 05/11/2022 1602 Gross per 24 hour  Intake 717 ml  Output 2025 ml  Net -1308 ml   Filed Weights   05/08/22 0552 05/11/22 1049  Weight: 87.8 kg 82 kg    Examination:   Constitutional: NAD, AAOx3 HEENT: conjunctivae and lids normal, EOMI CV: No cyanosis.   RESP: normal respiratory effort, on RA Extremities: edema in BLE, less tense SKIN: warm, dry Neuro: II - XII grossly intact.   Psych: Normal mood and affect.  Appropriate judgement and reason   Data Reviewed: I have personally reviewed labs and imaging studies  Time  spent: 35 minutes  Enzo Bi, MD [facility] If 7PM-7AM, please contact night-coverage 05/11/2022, 4:56 PM

## 2022-05-11 NOTE — Progress Notes (Signed)
[name] [name]  Patient ID: [name] WEDDINGTON [name] MRN: [id number] DOB/AGE: 10/25/1955 67 y.o.   Admit date: 05/05/2022 Referring Physician Dr. Enzo Bi  Primary Physician [name], NP Primary Cardiologist [facility] Reason for Consultation acute on chronic congestive heart failure  HPI: [name] [name]. [name] Is a 67yoM with a PMH of HFrEF (<20%, severely decreased LV function with inf/basal anteroseptal, basal inf and apex akinesis), CAD with hx STEMI 2005 s/p PCI, hx rectal cancer s/p XRT and chemo, hx ileostomy s/p takedown 08/2015 who presented to [facility] ED 05/05/2022 with a 35 pound weight gain and dyspnea on exertion.  He was COVID-positive on presentation.  Diuresing well on IV Lasix twice daily.  Interval History: -Continues to feel better, still remains with pitting edema on both flanks/pannus -No chest pain or shortness of breath -weight down from 87.8kg to 82 kg   Vitals:   05/10/22 1948 05/11/22 0014 05/11/22 0314 05/11/22 0740  BP: 115/87 1'21/87 98/69 95/65 '$  Pulse: 65 64 (!) 59 63  Resp: '20 18 20 18  '$ Temp: 98.5 F (36.9 C) 98.8 F (37.1 C) 98.5 F (36.9 C) 97.9 F (36.6 C)  TempSrc:  Oral    SpO2: 100% 98% 100% 96%  Weight:      Height:         Intake/Output Summary (Last 24 hours) at 05/11/2022 1191 Last data filed at 05/10/2022 1800 Gross per 24 hour  Intake 480 ml  Output 1100 ml  Net -620 ml     PHYSICAL EXAM General: Pleasant black male, well nourished, in no acute distress.  Sitting on edge of hospital bed. HEENT:  Normocephalic and atraumatic. Neck:   No JVD.  Lungs: Normal respiratory effort on room air.  Clear to ascultation bilaterally. Heart: HRRR . Normal S1 and S2 without gallops or murmurs. Abdomen: Mildly-distended appearing, nontender to palpation, 1+ pitting both flanks/ pannus.  Msk: Normal strength and tone for age. Extremities: No clubbing, cyanosis.1+ bilateral lower extremity edema with chronic venous stasis hyperpigmentation.  Neuro:  Alert and oriented x3 Psych:  pleasant, cooperative   LABS: Basic Metabolic Panel: Recent Labs    05/10/22 0548 05/11/22 0521  NA 141 139  K 3.6 3.5  CL 100 100  CO2 31 29  GLUCOSE 72 76  BUN 23 24*  CREATININE 1.36* 1.16  CALCIUM 9.3 8.7*  MG 2.0 1.8    Liver Function Tests: No results for input(s): "AST", "ALT", "ALKPHOS", "BILITOT", "PROT", "ALBUMIN" in the last 72 hours. No results for input(s): "LIPASE", "AMYLASE" in the last 72 hours. CBC: Recent Labs    05/10/22 0548 05/11/22 0521  WBC 5.8 5.8  HGB 11.0* 10.5*  HCT 34.1* 32.1*  MCV 82.6 81.9  PLT 204 171    Cardiac Enzymes: No results for input(s): "CKTOTAL", "CKMB", "CKMBINDEX", "TROPONINI" in the last 72 hours. BNP: Invalid input(s): "POCBNP" D-Dimer: No results for input(s): "DDIMER" in the last 72 hours. Hemoglobin A1C: No results for input(s): "HGBA1C" in the last 72 hours. Fasting Lipid Panel: No results for input(s): "CHOL", "HDL", "LDLCALC", "TRIG", "CHOLHDL", "LDLDIRECT" in the last 72 hours. Thyroid Function Tests: No results for input(s): "TSH", "T4TOTAL", "T3FREE", "THYROIDAB" in the last 72 hours.  Invalid input(s): "FREET3" Anemia Panel: No results for input(s): "VITAMINB12", "FOLATE", "FERRITIN", "TIBC", "IRON", "RETICCTPCT" in the last 72 hours.  No results found.   Echo EF 20% 03/08/2022  TELEMETRY:  sinus rhythm rate 60s-70s with occasional PVCs  ASSESSMENT AND PLAN:  Principal Problem:   Acute on  chronic systolic CHF (congestive heart failure) (HCC) Active Problems:   CAD (coronary artery disease)   HTN (hypertension)   Diabetes mellitus (HCC)   Abnormal liver function tests   History of rectal cancer   Stage 3a chronic kidney disease (HCC)   Cardiomyopathy (HCC)   Chronic anemia    1. Acute on chronic systolic congestive heart failure, HFrEF, EF 20% 03/08/2022, furosemide 40 mg IV twice daily, has persistent increased abdominal girth, oxygen saturation 92% on room  air.  -Still somewhat hypervolemic appearing with abdominal distention/pannus pitting and 1+ peripheral edema. Net IO Since Admission: -5,485 mL [05/11/22 0829] -although not accurate as patient was down for an his urine before it was recorded.  Weight is down 5.8 kg fortunately 2.  Coronary artery disease, history of STEMI and PCI 2005, currently no chest pain 3.  COVID infection, on Paxlovid 4.  Chronic kidney disease stage IIIa, BUN and creatinine 23 and 1.27, respectively   Recommendations   1.  Agree with current therapy 2.  Continue diuresis with IV Lasix 40 mg twice daily  -Add metolazone 5 mg p.o. x 1, monitor response before further dosing 3.  Carefully monitor renal status 4.  Hold carvedilol with relative bradycardia 5.  Hold Entresto for now 6.  Continue spironolactone 12.5 mg once daily, uptitrate as BP allows 7.  Plan to escalate GDMT with SGLT2i. 8.  Follow-up Dr. Clayborn Bigness after discharge for consideration of primary prevention ICD   This patient's plan of care was discussed and created with Dr. Saralyn Pilar  and he is in agreement.    [name], PA-C 05/11/2022 8:29 AM

## 2022-05-11 NOTE — Care Management Important Message (Signed)
Important Message  Patient Details  Name: [name] MOREJON [name] MRN: [id number] Date of Birth: [birth year]   Medicare Important Message Given:  Yes  Reviewed Medicare IM with patient via room phone [phone]) due to isolation status.  Copy of Medicare IM placed in mail to home address on file.    Dannette Barbara 05/11/2022, 10:20 AM

## 2022-05-12 LAB — MAGNESIUM: Magnesium: 2 mg/dL (ref 1.7–2.4)

## 2022-05-12 LAB — BASIC METABOLIC PANEL
Anion gap: 13 (ref 5–15)
BUN: 27 mg/dL — ABNORMAL HIGH (ref 8–23)
CO2: 29 mmol/L (ref 22–32)
Calcium: 9.2 mg/dL (ref 8.9–10.3)
Chloride: 96 mmol/L — ABNORMAL LOW (ref 98–111)
Creatinine, Ser: 1.25 mg/dL — ABNORMAL HIGH (ref 0.61–1.24)
GFR, Estimated: >60 mL/min (ref >60)
Glucose, Bld: 73 mg/dL (ref 70–99)
Potassium: 3.6 mmol/L (ref 3.5–5.1)
Sodium: 138 mmol/L (ref 135–145)

## 2022-05-12 LAB — CBC
HCT: 32.5 % — ABNORMAL LOW (ref 39.0–52.0)
Hemoglobin: 10.8 g/dL — ABNORMAL LOW (ref 13.0–17.0)
MCH: 26.9 pg (ref 26.0–34.0)
MCHC: 33.2 g/dL (ref 30.0–36.0)
MCV: 81 fL (ref 80.0–100.0)
Platelets: 188 10*3/uL (ref 150–400)
RBC: 4.01 MIL/uL — ABNORMAL LOW (ref 4.22–5.81)
RDW: 16.5 % — ABNORMAL HIGH (ref 11.5–15.5)
WBC: 6.6 10*3/uL (ref 4.0–10.5)
nRBC: 0 % (ref 0.0–0.2)

## 2022-05-12 MED ORDER — FUROSEMIDE 10 MG/ML IJ SOLN: 60.0000 mg | Freq: Two times a day (BID) | INTRAMUSCULAR | Status: DC

## 2022-05-12 MED ORDER — FUROSEMIDE 10 MG/ML IJ SOLN
60.0000 mg | Freq: Two times a day (BID) | INTRAMUSCULAR | Status: DC
  Administered 2022-05-12 (×2): 60 mg via INTRAVENOUS
  Filled 2022-05-12 (×3): qty 6

## 2022-05-12 NOTE — Progress Notes (Addendum)
[facility]  Patient ID: [name] [name] [name] MRN: [id number] DOB/AGE: Sep 10, 1955 67 y.o.   Admit date: 05/05/2022 Referring Physician Dr. Enzo Bi  Primary Physician [name], NP Primary Cardiologist [facility] Reason for Consultation acute on chronic congestive heart failure  HPI: [name] [name]. [name] Is a 67yoM with a PMH of HFrEF (<20%, severely decreased LV function with inf/basal anteroseptal, basal inf and apex akinesis), CAD with hx STEMI 2005 s/p PCI, hx rectal cancer s/p XRT and chemo, hx ileostomy s/p takedown 08/2015 who presented to [facility] ED 05/05/2022 with a 35 pound weight gain and dyspnea on exertion.  He was COVID-positive on presentation.  Diuresing well on IV Lasix twice daily.  Interval History: -Continues to feel better, legs feeling much softer with less pitting -Brisk diuresis after addition of p.o. metolazone 5 mg x 1 yesterday -No chest pain or shortness of breath -weight down from 87.8kg (admission) to 76.4 kg today   Vitals:   05/11/22 2041 05/12/22 0111 05/12/22 0443 05/12/22 0741  BP: 116/88 (!) 123/91 115/79 94/66  Pulse:  78 64 64  Resp: '20 16 17 18  '$ Temp:  98.1 F (36.7 C) 97.9 F (36.6 C) 97.7 F (36.5 C)  TempSrc:  Oral Oral Oral  SpO2:  100% 100% 97%  Weight:    76.4 kg  Height:         Intake/Output Summary (Last 24 hours) at 05/12/2022 1126 Last data filed at 05/12/2022 0900 Gross per 24 hour  Intake 477 ml  Output 3225 ml  Net -2748 ml     PHYSICAL EXAM General: Pleasant black male, well nourished, in no acute distress.  Sitting on edge of hospital bed. HEENT:  Normocephalic and atraumatic. Neck:   No JVD.  Lungs: Normal respiratory effort on room air.  Clear to ascultation bilaterally. Heart: HRRR . Normal S1 and S2 without gallops or murmurs. Abdomen: Mildly-distended appearing, nontender to palpation, trace edema but softening on both flanks/ pannus.  Msk: Normal strength and tone for age. Extremities: No clubbing,  cyanosis.1+ bilateral lower extremity edema with puffiness to both feet with chronic venous stasis hyperpigmentation.  Neuro: Alert and oriented x3 Psych:  pleasant, cooperative   LABS: Basic Metabolic Panel: Recent Labs    05/11/22 0521 05/12/22 0605  NA 139 138  K 3.5 3.6  CL 100 96*  CO2 29 29  GLUCOSE 76 73  BUN 24* 27*  CREATININE 1.16 1.25*  CALCIUM 8.7* 9.2  MG 1.8 2.0    Liver Function Tests: No results for input(s): "AST", "ALT", "ALKPHOS", "BILITOT", "PROT", "ALBUMIN" in the last 72 hours. No results for input(s): "LIPASE", "AMYLASE" in the last 72 hours. CBC: Recent Labs    05/11/22 0521 05/12/22 0605  WBC 5.8 6.6  HGB 10.5* 10.8*  HCT 32.1* 32.5*  MCV 81.9 81.0  PLT 171 188    Cardiac Enzymes: No results for input(s): "CKTOTAL", "CKMB", "CKMBINDEX", "TROPONINI" in the last 72 hours. BNP: Invalid input(s): "POCBNP" D-Dimer: No results for input(s): "DDIMER" in the last 72 hours. Hemoglobin A1C: No results for input(s): "HGBA1C" in the last 72 hours. Fasting Lipid Panel: No results for input(s): "CHOL", "HDL", "LDLCALC", "TRIG", "CHOLHDL", "LDLDIRECT" in the last 72 hours. Thyroid Function Tests: No results for input(s): "TSH", "T4TOTAL", "T3FREE", "THYROIDAB" in the last 72 hours.  Invalid input(s): "FREET3" Anemia Panel: No results for input(s): "VITAMINB12", "FOLATE", "FERRITIN", "TIBC", "IRON", "RETICCTPCT" in the last 72 hours.  No results found.   Echo EF 20% 03/08/2022  TELEMETRY:  sinus rhythm rate 60s-70s with occasional PVCs  ASSESSMENT AND PLAN:  Principal Problem:   Acute on chronic systolic CHF (congestive heart failure) (HCC) Active Problems:   CAD (coronary artery disease)   HTN (hypertension)   Diabetes mellitus (HCC)   Abnormal liver function tests   History of rectal cancer   Stage 3a chronic kidney disease (HCC)   Cardiomyopathy (HCC)   Chronic anemia    1. Acute on chronic systolic congestive heart failure,  HFrEF, EF 20% 03/08/2022, furosemide 40 mg IV twice daily, has persistent increased abdominal girth, oxygen saturation 92% on room air.  -Still slight hypervolemic appearing with abdominal distention/pannus edema, although close to Net IO Since Admission: -7,993 mL [05/12/22 1126]   Weight is down 11.4 kg fortunately 2.  Coronary artery disease, history of STEMI and PCI 2005, currently no chest pain 3.  COVID infection, on Paxlovid 4.  Chronic kidney disease stage IIIa, BUN and creatinine 23 and 1.27, respectively   Recommendations   1.  Agree with current therapy 2.  Increase diuresis with IV Lasix from 40 mg to 60 mg IV twice daily x 3 doses, then likely discharge on lasix '40mg'$  PO daily   -s/p metolazone 5 mg p.o. x 1 on 1/23, with brisk diuresis,  hold further dosing for today 3.  Carefully monitor renal status 4.  Hold carvedilol with relative bradycardia 5.  Hold Entresto for now with low BPs 6.  Continue spironolactone 12.5 mg once daily 7.  Plan to escalate GDMT with SGLT2i, probably on an outpatient basis if BP allows 8.  Follow-up Dr. Clayborn Bigness after discharge for consideration of primary prevention ICD   This patient's plan of care was discussed and created with Dr. Saralyn [name]  and he is in agreement.    [name], PA-C 05/12/2022 11:26 AM

## 2022-05-12 NOTE — Progress Notes (Signed)
  Progress Note   Patient: [name] [name] WHQ:[id number] DOB: January 19, 1956 DOA: 05/05/2022     7 DOS: the patient was seen and examined on 05/12/2022   Brief hospital course:  Assessment and Plan: * Acute on chronic systolic CHF (congestive heart failure) (HCC) Cardiomyopathy, EF 20% 02/2022 - IV lasix 60 mg bid  - Potassium 40 meq PO daily  - Aldactone 12.5 mg PO daily   CAD (coronary artery disease) History of STEMI 2005 - Appreciate cardiology following along   HTN (hypertension) - Monitor   Diabetes mellitus ([place]) - A1c 5.5  - Ensure PO bid   Stage 3a chronic kidney disease ([place]) - Renal function at baseline  History of rectal cancer - Rectal adenocarcinoma diagnosed in 08/2014, s/p radiation and chemo, LAR with diverting loop ileostomy followed by ileostomy takedown on 09/08/2015   Chronic anemia - Ferrous sulfate 325 mg PO every other day   Abnormal liver function tests - Monitor   DVT prophylaxis: Lovenox 40 mg sq daily       Subjective: Pt seen and examined. Appreciate cardiology following along and adjusting the pt's cardiac medications. Continue with increased dose of IV lasix.   Physical Exam: Vitals:   05/12/22 0111 05/12/22 0443 05/12/22 0741 05/12/22 1147  BP: (!) 123/91 115/79 94/66 (!) 92/56  Pulse: 78 64 64 65  Resp: '16 17 18 18  '$ Temp: 98.1 F (36.7 C) 97.9 F (36.6 C) 97.7 F (36.5 C) 97.8 F (36.6 C)  TempSrc: Oral Oral Oral Oral  SpO2: 100% 100% 97% 100%  Weight:   76.4 kg   Height:       Physical Exam HENT:     Head: Normocephalic.  Cardiovascular:     Rate and Rhythm: Normal rate.  Pulmonary:     Effort: Pulmonary effort is normal.  Abdominal:     Palpations: Abdomen is soft.  Musculoskeletal:     Cervical back: Neck supple.     Right lower leg: Edema present.     Left lower leg: Edema present.  Skin:    General: Skin is warm.  Neurological:     Mental Status: He is alert.  Psychiatric:        Mood and Affect: Mood  normal.     Data Reviewed:   Disposition: Status is: Inpatient  Planned Discharge Destination: Home    Time spent: 35 minutes  Author: Lucienne Minks , MD 05/12/2022 3:08 PM  For on call review [email or web address].

## 2022-05-13 LAB — CBC
HCT: 32.7 % — ABNORMAL LOW (ref 39.0–52.0)
Hemoglobin: 10.9 g/dL — ABNORMAL LOW (ref 13.0–17.0)
MCH: 26.9 pg (ref 26.0–34.0)
MCHC: 33.3 g/dL (ref 30.0–36.0)
MCV: 80.7 fL (ref 80.0–100.0)
Platelets: 200 10*3/uL (ref 150–400)
RBC: 4.05 MIL/uL — ABNORMAL LOW (ref 4.22–5.81)
RDW: 15.9 % — ABNORMAL HIGH (ref 11.5–15.5)
WBC: 7.8 10*3/uL (ref 4.0–10.5)
nRBC: 0 % (ref 0.0–0.2)

## 2022-05-13 LAB — BASIC METABOLIC PANEL
Anion gap: 8 (ref 5–15)
BUN: 32 mg/dL — ABNORMAL HIGH (ref 8–23)
CO2: 34 mmol/L — ABNORMAL HIGH (ref 22–32)
Calcium: 9.3 mg/dL (ref 8.9–10.3)
Chloride: 94 mmol/L — ABNORMAL LOW (ref 98–111)
Creatinine, Ser: 1.22 mg/dL (ref 0.61–1.24)
GFR, Estimated: >60 mL/min (ref >60)
Glucose, Bld: 106 mg/dL — ABNORMAL HIGH (ref 70–99)
Potassium: 4.2 mmol/L (ref 3.5–5.1)
Sodium: 136 mmol/L (ref 135–145)

## 2022-05-13 LAB — MAGNESIUM: Magnesium: 2 mg/dL (ref 1.7–2.4)

## 2022-05-13 MED ORDER — FUROSEMIDE 40 MG PO TABS: 40.0000 mg | ORAL_TABLET | Freq: Every day | ORAL | Status: DC

## 2022-05-13 MED ORDER — COLCHICINE 0.6 MG PO TABS
0.6000 mg | ORAL_TABLET | Freq: Once | ORAL | Status: AC
  Administered 2022-05-13: 0.6 mg via ORAL
  Filled 2022-05-13: qty 1

## 2022-05-13 MED ORDER — SPIRONOLACTONE 25 MG PO TABS: 12.5000 mg | ORAL_TABLET | Freq: Every day | ORAL | 0 refills | Status: DC

## 2022-05-13 MED ORDER — FUROSEMIDE 40 MG PO TABS: 40.0000 mg | ORAL_TABLET | Freq: Every day | ORAL | 0 refills | Status: DC

## 2022-05-13 NOTE — Progress Notes (Signed)
   Heart Failure Nurse Navigator Note  Met with patient today sitting up in the bed in no acute distress.  He states that he has been discharged home today.  He had told his nurse that he does not have a working scale I have provided him with 1.  Teach back method went over daily weights and what to report along with changes in symptoms, he needed very little reinforcement.  Reminded to follow-up in the outpatient heart failure clinic for which she has an appointment also was given a red bag for bringing in his medications to that appointment.  He had no further questions.  Pricilla Riffle RN [facility]

## 2022-05-13 NOTE — Progress Notes (Signed)
Discussed discharge instructions, including medications and follow up appointments with patient and wife at bedside.   Instructed them to monitor Blood pressure once or twice daily due to the change in BP medications.   AVS documentation sent home with patient and wife.

## 2022-05-13 NOTE — Discharge Summary (Signed)
Physician Discharge Summary   Patient: [name] BIEHN [name] MRN: [id number] DOB: Oct 05, 1955  Admit date:     05/05/2022  Discharge date: 05/13/22  Discharge Physician: Lucienne Minks    PCP: Bunnie Pion, FNP   Recommendations at discharge:    Please take the lasix as prescribed and follow up with Dr. Clayborn Bigness in 1 - 2 weeks   Discharge Diagnoses: Principal Problem:   Acute on chronic systolic CHF (congestive heart failure) ([place]) Active Problems:   Cardiomyopathy ([place])   CAD (coronary artery disease)   HTN (hypertension)   Diabetes mellitus ([place])   Stage 3a chronic kidney disease ([place])   History of rectal cancer   Abnormal liver function tests   Chronic anemia  Resolved Problems:   * No resolved hospital problems. *  Hospital Course: 67 y.o. male who presented with dyspnea on exertion and recent weight gain of 35 pounds & mild lower extremity edema. The pt was treated for acute on chronic systolic CHF. He received IV lasix and aldactone with good results. Cardiology was consulted and they adjusted the pt's cardiac medications. He was carefully diuresed with attention to his kidney function. He received one dose of colchicine for presumed acute on chronic gout flare (he reports he has a history of this). Cardiology cleared the pt for discharge on 05/13/2022. He will go home with lasix 40 mg PO daily and aldactone 12.5 mg PO daily. Followup with Dr. Clayborn Bigness in 1-2 weeks.   Assessment and Plan: * Acute on chronic systolic CHF (congestive heart failure) (HCC) Cardiomyopathy, EF 20% 02/2022 Continue IV Lasix Continue carvedilol, Entresto Daily weights with intake and output monitoring Cardiology consult for additional recommendations in view of recent low EF  CAD (coronary artery disease) History of STEMI 2005 No complaints of chest pain, troponin 20 and EKG nonacute Continue atorvastatin and carvedilol and pravastatin  HTN (hypertension) BP controlled Continue Entresto  and carvedilol  Diabetes mellitus ([place]) Diet controlled Get A1c Sliding scale insulin coverage while hospitalized  Stage 3a chronic kidney disease ([place]) Renal function at baseline  History of rectal cancer Rectal adenocarcinoma diagnosed in 08/2014, s/p radiation and chemo, LAR with diverting loop ileostomy followed by ileostomy takedown on 09/08/2015  Seen by GI 02/2022  Chronic anemia Stable at 11-12  Abnormal liver function tests Elevated alk phos to the mid 200s, baseline mid 440H etiology uncertain Outpatient follow-up        Consultants: Cardiology Dr. Saralyn Pilar Disposition: Home Diet recommendation:  Cardiac diet DISCHARGE MEDICATION: Allergies as of 05/13/2022   No Known Allergies      Medication List     STOP taking these medications    bumetanide 0.5 MG tablet Commonly known as: BUMEX   carvedilol 3.125 MG tablet Commonly known as: COREG   cloNIDine 0.2 MG tablet Commonly known as: CATAPRES   Entresto 24-26 MG Generic drug: sacubitril-valsartan   losartan 25 MG tablet Commonly known as: COZAAR   MiraLax 17 GM/SCOOP powder Generic drug: polyethylene glycol powder   polyethylene glycol powder 17 GM/SCOOP powder Commonly known as: GLYCOLAX/MIRALAX   pravastatin 20 MG tablet Commonly known as: PRAVACHOL       TAKE these medications    acetaminophen 500 MG tablet Commonly known as: TYLENOL Take 500 mg by mouth every 6 (six) hours as needed.   atorvastatin 20 MG tablet Commonly known as: LIPITOR Take 20 mg by mouth at bedtime.   colchicine 0.6 MG tablet Take 0.6 mg by mouth daily.   furosemide 40  MG tablet Commonly known as: LASIX Take 1 tablet (40 mg total) by mouth daily. Start taking on: May 14, 2022 What changed:  medication strength how much to take when to take this   Iron 325 (65 Fe) MG Tabs Take 1 tablet by mouth every other day.   spironolactone 25 MG tablet Commonly known as: ALDACTONE Take 0.5 tablets (12.5  mg total) by mouth daily.   vitamin C 1000 MG tablet Take 1,000 mg by mouth daily. Reported on 09/26/2015        Follow-up Information     Yolonda Kida, MD. Go in 1 week(s).   Specialties: Cardiology, Internal Medicine Contact information: [place] [address] [phone]                Discharge Exam: Danley Danker Weights   05/11/22 1049 05/11/22 1744 05/12/22 0741  Weight: 82 kg 79.1 kg 76.4 kg    Condition at discharge: fair  The results of significant diagnostics from this hospitalization (including imaging, microbiology, ancillary and laboratory) are listed below for reference.   Imaging Studies: ECHOCARDIOGRAM COMPLETE  Result Date: 05/08/2022    ECHOCARDIOGRAM REPORT   Patient Name:   [name] JUTRAS [name] Date of Exam: 05/08/2022 Medical Rec #:  [id number]             Height:       66.0 in Accession #:    [id number]            Weight:       193.6 lb Date of Birth:  05-02-1955             BSA:          1.972 m Patient Age:    31 years              BP:           117/91 mmHg Patient Gender: M                     HR:           50 bpm. Exam Location:  [facility] Procedure: 2D Echo, Color Doppler, Cardiac Doppler and Intracardiac            Opacification Agent Indications:     Pericardial Effusion  History:         Patient has prior history of Echocardiogram examinations.                  Previous Myocardial Infarction and CAD; Risk                  Factors:Hypertension and Diabetes.  Sonographer:     L. Thornton-Maynard Referring Phys:  [id number] Karma Greaser D CALLWOOD Diagnosing Phys: Isaias Cowman MD IMPRESSIONS  1. Left ventricular ejection fraction, by estimation, is <20%. The left ventricle has severely decreased function. The left ventricle demonstrates regional wall motion abnormalities (see scoring diagram/findings for description). The left ventricular internal cavity size was moderately to severely dilated. Left ventricular diastolic parameters were  normal.  2. Right ventricular systolic function is normal. The right ventricular size is normal. There is normal pulmonary artery systolic pressure.  3. Moderate pleural effusion in the left lateral region.  4. The mitral valve is normal in structure. Moderate mitral valve regurgitation. No evidence of mitral stenosis.  5. Tricuspid valve regurgitation is moderate.  6. The aortic valve is normal in structure. Aortic valve regurgitation is not visualized. No aortic stenosis  is present.  7. The inferior vena cava is normal in size with greater than 50% respiratory variability, suggesting right atrial pressure of 3 mmHg. FINDINGS  Left Ventricle: Left ventricular ejection fraction, by estimation, is <20%. The left ventricle has severely decreased function. The left ventricle demonstrates regional wall motion abnormalities. Definity contrast agent was given IV to delineate the left ventricular endocardial borders. The left ventricular internal cavity size was moderately to severely dilated. There is no left ventricular hypertrophy. Left ventricular diastolic parameters were normal.  LV Wall Scoring: The inferior septum, basal anteroseptal segment, basal inferior segment, and apex are akinetic. Right Ventricle: The right ventricular size is normal. No increase in right ventricular wall thickness. Right ventricular systolic function is normal. There is normal pulmonary artery systolic pressure. The tricuspid regurgitant velocity is 2.41 m/s, and  with an assumed right atrial pressure of 3 mmHg, the estimated right ventricular systolic pressure is 16.0 mmHg. Left Atrium: Left atrial size was normal in size. Right Atrium: Right atrial size was normal in size. Pericardium: Trivial pericardial effusion is present. Mitral Valve: The mitral valve is normal in structure. Moderate mitral valve regurgitation. No evidence of mitral valve stenosis. Tricuspid Valve: The tricuspid valve is normal in structure. Tricuspid valve  regurgitation is moderate . No evidence of tricuspid stenosis. Aortic Valve: The aortic valve is normal in structure. Aortic valve regurgitation is not visualized. No aortic stenosis is present. Aortic valve mean gradient measures 2.5 mmHg. Aortic valve peak gradient measures 5.3 mmHg. Aortic valve area, by VTI measures 1.63 cm. Pulmonic Valve: The pulmonic valve was normal in structure. Pulmonic valve regurgitation is not visualized. No evidence of pulmonic stenosis. Aorta: The aortic root is normal in size and structure. Venous: The inferior vena cava is normal in size with greater than 50% respiratory variability, suggesting right atrial pressure of 3 mmHg. IAS/Shunts: No atrial level shunt detected by color flow Doppler. Additional Comments: There is a moderate pleural effusion in the left lateral region.  LEFT VENTRICLE PLAX 2D LVIDd:         6.40 cm      Diastology LVIDs:         5.50 cm      LV e' medial:    1.80 cm/s LV PW:         0.90 cm      LV E/e' medial:  42.5 LV IVS:        1.00 cm      LV e' lateral:   2.40 cm/s LVOT diam:     2.30 cm      LV E/e' lateral: 31.9 LV SV:         33 LV SV Index:   17 LVOT Area:     4.15 cm  LV Volumes (MOD) LV vol d, MOD A2C: 230.0 ml LV vol d, MOD A4C: 232.0 ml LV vol s, MOD A2C: 197.0 ml LV vol s, MOD A4C: 183.0 ml LV SV MOD A2C:     33.0 ml LV SV MOD A4C:     232.0 ml LV SV MOD BP:      38.2 ml RIGHT VENTRICLE RV S prime:     7.56 cm/s TAPSE (M-mode): 1.0 cm LEFT ATRIUM              Index        RIGHT ATRIUM           Index LA Vol (A2C):   91.7 ml  46.50 ml/m  RA  Area:     32.20 cm LA Vol (A4C):   136.0 ml 68.96 ml/m  RA Volume:   138.00 ml 69.97 ml/m LA Biplane Vol: 114.0 ml 57.80 ml/m  AORTIC VALVE                    PULMONIC VALVE AV Area (Vmax):    1.73 cm     PV Vmax:          0.72 m/s AV Area (Vmean):   1.98 cm     PV Peak grad:     2.1 mmHg AV Area (VTI):     1.63 cm     PR End Diast Vel: 5.43 msec AV Vmax:           115.00 cm/s AV Vmean:           73.150 cm/s AV VTI:            0.204 m AV Peak Grad:      5.3 mmHg AV Mean Grad:      2.5 mmHg LVOT Vmax:         47.90 cm/s LVOT Vmean:        34.900 cm/s LVOT VTI:          0.080 m LVOT/AV VTI ratio: 0.39  AORTA Ao Root diam: 3.60 cm Ao Asc diam:  3.50 cm MITRAL VALVE               TRICUSPID VALVE MV Area (PHT): 3.20 cm    TR Peak grad:   23.2 mmHg MV Decel Time: 237 msec    TR Vmax:        241.00 cm/s MV E velocity: 76.50 cm/s                            SHUNTS                            Systemic VTI:  0.08 m                            Systemic Diam: 2.30 cm Isaias Cowman MD Electronically signed by Isaias Cowman MD Signature Date/Time: 05/08/2022/1:31:46 PM    Final    DG Chest 2 View  Result Date: 05/05/2022 CLINICAL DATA:  Shortness of breath. Reportedly 35 pound weight gain over the past month. EXAM: CHEST - 2 VIEW COMPARISON:  02/19/2022 chest CT and radiograph from 08/28/2015 FINDINGS: Low lung volumes are present, causing crowding of the pulmonary vasculature. Moderate to prominent enlargement of the cardiopericardial silhouette. No pulmonary edema. The lungs appear clear. No blunting of the costophrenic angles. Lower thoracic spondylosis. IMPRESSION: 1. Moderate to prominent enlargement of the cardiopericardial silhouette, without edema. 2. Low lung volumes are present, causing crowding of the pulmonary vasculature. Electronically Signed   By: Van Clines M.D.   On: 05/05/2022 17:54    Microbiology: Results for orders placed or performed during the hospital encounter of 05/05/22  Resp panel by RT-PCR (RSV, Flu A&B, Covid) Anterior Nasal Swab     Status: Abnormal   Collection Time: 05/06/22 11:45 AM   Specimen: Anterior Nasal Swab  Result Value Ref Range Status   SARS Coronavirus 2 by RT PCR POSITIVE (A) NEGATIVE Final    Comment: (NOTE) SARS-CoV-2 target nucleic acids are DETECTED.  The SARS-CoV-2 RNA is generally detectable in upper  respiratory specimens during the  acute phase of infection. Positive results are indicative of the presence of the identified virus, but do not rule out bacterial infection or co-infection with other pathogens not detected by the test. Clinical correlation with patient history and other diagnostic information is necessary to determine patient infection status. The expected result is Negative.  Fact Sheet for Patients: EntrepreneurPulse.com.au  Fact Sheet for Healthcare Providers: IncredibleEmployment.be  This test is not yet approved or cleared by the [place] FDA and  has been authorized for detection and/or diagnosis of SARS-CoV-2 by FDA under an Emergency Use Authorization (EUA).  This EUA will remain in effect (meaning this test can be used) for the duration of  the COVID-19 declaration under Section 564(b)(1) of the A ct, 21 U.S.C. section 360bbb-3(b)(1), unless the authorization is terminated or revoked sooner.     Influenza A by PCR NEGATIVE NEGATIVE Final   Influenza B by PCR NEGATIVE NEGATIVE Final    Comment: (NOTE) The Xpert Xpress SARS-CoV-2/FLU/RSV plus assay is intended as an aid in the diagnosis of influenza from Nasopharyngeal swab specimens and should not be used as a sole basis for treatment. Nasal washings and aspirates are unacceptable for Xpert Xpress SARS-CoV-2/FLU/RSV testing.  Fact Sheet for Patients: EntrepreneurPulse.com.au  Fact Sheet for Healthcare Providers: IncredibleEmployment.be  This test is not yet approved or cleared by the [place] FDA and has been authorized for detection and/or diagnosis of SARS-CoV-2 by FDA under an Emergency Use Authorization (EUA). This EUA will remain in effect (meaning this test can be used) for the duration of the COVID-19 declaration under Section 564(b)(1) of the Act, 21 U.S.C. section 360bbb-3(b)(1), unless the authorization is terminated or revoked.     Resp  Syncytial Virus by PCR NEGATIVE NEGATIVE Final    Comment: (NOTE) Fact Sheet for Patients: EntrepreneurPulse.com.au  Fact Sheet for Healthcare Providers: IncredibleEmployment.be  This test is not yet approved or cleared by the [place] FDA and has been authorized for detection and/or diagnosis of SARS-CoV-2 by FDA under an Emergency Use Authorization (EUA). This EUA will remain in effect (meaning this test can be used) for the duration of the COVID-19 declaration under Section 564(b)(1) of the Act, 21 U.S.C. section 360bbb-3(b)(1), unless the authorization is terminated or revoked.  Performed at [facility], [address] [place],  [address]     Labs: CBC: Recent Labs  Lab 05/09/22 [phone] 05/10/22 0548 05/11/22 0521 05/12/22 0605 05/13/22 0839  WBC 5.0 5.8 5.8 6.6 7.8  HGB 10.5* 11.0* 10.5* 10.8* 10.9*  HCT 32.1* 34.1* 32.1* 32.5* 32.7*  MCV 81.9 82.6 81.9 81.0 80.7  PLT 181 204 171 188 149   Basic Metabolic Panel: Recent Labs  Lab 05/09/22 0653 05/10/22 0548 05/11/22 0521 05/12/22 0605 05/13/22 0839  NA 141 141 139 138 136  K 3.4* 3.6 3.5 3.6 4.2  CL 104 100 100 96* 94*  CO2 '30 31 29 29 '$ 34*  GLUCOSE 75 72 76 73 106*  BUN 23 23 24* 27* 32*  CREATININE 1.36* 1.36* 1.16 1.25* 1.22  CALCIUM 9.0 9.3 8.7* 9.2 9.3  MG 2.0 2.0 1.8 2.0 2.0   Liver Function Tests: No results for input(s): "AST", "ALT", "ALKPHOS", "BILITOT", "PROT", "ALBUMIN" in the last 168 hours. CBG: Recent Labs  Lab 05/07/22 1755 05/07/22 2219 05/08/22 0757 05/08/22 1216 05/08/22 1739  GLUCAP 99 144* 78 91 76    Discharge time spent: greater than 30 minutes.  Signed: Lucienne Minks , MD [facility]  05/13/2022 

## 2022-05-13 NOTE — Progress Notes (Signed)
[facility]  Patient ID: [name] NOWLING [name] MRN: [id number] DOB/AGE: 04/23/1955 67 y.o.   Admit date: 05/05/2022 Referring Physician Dr. Enzo Bi  Primary Physician [name] Milo, NP Primary Cardiologist [facility] Reason for Consultation acute on chronic congestive heart failure  HPI: [name] Dogan. Porrata [name] Is a 67yoM with a PMH of HFrEF (<20%, severely decreased LV function with inf/basal anteroseptal, basal inf and apex akinesis), CAD with hx STEMI 2005 s/p PCI, hx rectal cancer s/p XRT and chemo, hx ileostomy s/p takedown 08/2015 who presented to [facility] ED 05/05/2022 with a 35 pound weight gain and dyspnea on exertion.  He was COVID-positive on presentation.  Diuresing well on IV Lasix twice daily.  Interval History: -only complaint is left knee pain, feels back to baseline interms of his edema -no chest pain or shortness of breath   Vitals:   05/12/22 2024 05/13/22 0027 05/13/22 0346 05/13/22 0837  BP: (!) 82/51 1'02/78 98/69 92/64 '$  Pulse: 65 68 71 72  Resp: '16 18 18 16  '$ Temp: 98.5 F (36.9 C) 98.4 F (36.9 C) 98.3 F (36.8 C) 99.4 F (37.4 C)  TempSrc: Oral  Oral   SpO2: 99% 100% 100% 99%  Weight:      Height:         Intake/Output Summary (Last 24 hours) at 05/13/2022 1023 Last data filed at 05/12/2022 1419 Gross per 24 hour  Intake 477 ml  Output 1500 ml  Net -1023 ml     PHYSICAL EXAM General: Pleasant black male, well nourished, in no acute distress.  Sitting on edge of hospital bed. HEENT:  Normocephalic and atraumatic. Neck:   No JVD.  Lungs: Normal respiratory effort on room air.  Clear to ascultation bilaterally. Heart: HRRR . Normal S1 and S2 without gallops or murmurs. Abdomen: Mildly-distended appearing, nontender to palpation, flank/pannus edema resolved Msk: Normal strength and tone for age. Extremities: No clubbing, cyanosis.1+ bilateral lower extremity edema with puffiness to both feet with chronic venous stasis hyperpigmentation. R knee with warmth  and slight erythema, tender to palpation Neuro: Alert and oriented x3 Psych:  pleasant, cooperative   LABS: Basic Metabolic Panel: Recent Labs    05/12/22 0605 05/13/22 0839  NA 138 136  K 3.6 4.2  CL 96* 94*  CO2 29 34*  GLUCOSE 73 106*  BUN 27* 32*  CREATININE 1.25* 1.22  CALCIUM 9.2 9.3  MG 2.0 2.0    Liver Function Tests: No results for input(s): "AST", "ALT", "ALKPHOS", "BILITOT", "PROT", "ALBUMIN" in the last 72 hours. No results for input(s): "LIPASE", "AMYLASE" in the last 72 hours. CBC: Recent Labs    05/12/22 0605 05/13/22 0839  WBC 6.6 7.8  HGB 10.8* 10.9*  HCT 32.5* 32.7*  MCV 81.0 80.7  PLT 188 200    Cardiac Enzymes: No results for input(s): "CKTOTAL", "CKMB", "CKMBINDEX", "TROPONINI" in the last 72 hours. BNP: Invalid input(s): "POCBNP" D-Dimer: No results for input(s): "DDIMER" in the last 72 hours. Hemoglobin A1C: No results for input(s): "HGBA1C" in the last 72 hours. Fasting Lipid Panel: No results for input(s): "CHOL", "HDL", "LDLCALC", "TRIG", "CHOLHDL", "LDLDIRECT" in the last 72 hours. Thyroid Function Tests: No results for input(s): "TSH", "T4TOTAL", "T3FREE", "THYROIDAB" in the last 72 hours.  Invalid input(s): "FREET3" Anemia Panel: No results for input(s): "VITAMINB12", "FOLATE", "FERRITIN", "TIBC", "IRON", "RETICCTPCT" in the last 72 hours.  No results found.   Echo EF 20% 03/08/2022  TELEMETRY:  sinus rhythm rate 60s-70s with occasional PVCs  ASSESSMENT AND PLAN:  Principal Problem:   Acute on chronic systolic CHF (congestive heart failure) (HCC) Active Problems:   CAD (coronary artery disease)   HTN (hypertension)   Diabetes mellitus (HCC)   Abnormal liver function tests   History of rectal cancer   Stage 3a chronic kidney disease (HCC)   Cardiomyopathy (HCC)   Chronic anemia    1. Acute on chronic systolic congestive heart failure, HFrEF, EF 20% 03/08/2022, furosemide 60 mg IV twice daily, euvolemic on exam  today. Net IO Since Admission: -9,016 mL [05/13/22 1023]   Weight is down 11.4 kg fortunately 2.  Coronary artery disease, history of STEMI and PCI 2005, currently no chest pain 3.  COVID infection, on Paxlovid 4.  Chronic kidney disease stage IIIa, BUN and creatinine 32/ 1.22 and GFR >60 respectively   Recommendations   1.  Agree with current therapy 2.  s/p '40mg'$  IV lasix BID, metolazone '5mg'$  PO x1, and 60 mg IV twice daily x 2 doses with good diuresis. Discharge home on '40mg'$  PO lasix daily  3.  Carefully monitor renal status 4.  Hold carvedilol with relative bradycardia 5.  Hold Entresto for now with low BPs 6.  Continue spironolactone 12.5 mg once daily 7.  Plan to escalate GDMT with SGLT2i, probably on an outpatient basis if BP allows 8.  Follow-up Dr. Clayborn Bigness after discharge for consideration of primary prevention ICD  Ok for discharge today from a cardiac perspective. Will arrange for follow-up with Dr. Clayborn Bigness in 1-2 weeks.   This patient's plan of care was discussed and created with Dr. Saralyn Pilar  and he is in agreement.    [name], PA-C 05/13/2022 10:23 AM

## 2022-05-14 ENCOUNTER — Ambulatory Visit: Admit: 2022-05-14 | Payer: Medicare HMO | Admitting: Gastroenterology

## 2022-05-14 SURGERY — COLONOSCOPY WITH PROPOFOL
Anesthesia: General

## 2022-05-17 NOTE — Progress Notes (Unsigned)
   Patient ID: [name] HAIG [name], male    DOB: May 13, 1955, 67 y.o.   MRN: [id number]  HPI  [name] [name] is a 67 y/o male with a history of  Echo 05/08/22 showed EF of <20% along with moderate pleural effusion in the left lateral region and moderate [name]/TR.   Admitted 05/05/22 due to SOB, weight gain and pedal edema due to a/c heart failure. Diuresed.   He presents today for his initial visit with a chief complaint of   Review of Systems    Physical Exam    Assessment & Plan:  1: Chronic heart failure with reduced ejection fraction- - NYHA class - GDMT of - BNP 05/05/22 was 3983.4  2: HTN with CKD- - BP - saw PCP - BMP 05/13/22 showed sodium 136, potassium 4.2, creatinine 1.22 & GFR >60  3: DM- - A1c 05/06/22 was 5.5%  4: CAD- - STEMI 2005 - saw cardiology Clayborn Bigness) 02/23/22  5: History of rectal cancer- - saw GI Marius Ditch) 03/10/22 - saw oncology Tasia Catchings) 02/08/22

## 2022-05-18 ENCOUNTER — Other Ambulatory Visit (HOSPITAL_COMMUNITY): Payer: Self-pay

## 2022-05-18 ENCOUNTER — Encounter: Payer: Self-pay | Admitting: Family

## 2022-05-18 ENCOUNTER — Encounter: Payer: Self-pay | Admitting: Pharmacy Technician

## 2022-05-18 ENCOUNTER — Ambulatory Visit: Payer: Medicare HMO | Attending: Family | Admitting: Family

## 2022-05-18 VITALS — BP 96/64 | HR 69 | Wt 151.2 lb

## 2022-05-18 DIAGNOSIS — J90 Pleural effusion, not elsewhere classified: Secondary | ICD-10-CM | POA: Insufficient documentation

## 2022-05-18 DIAGNOSIS — E119 Type 2 diabetes mellitus without complications: Secondary | ICD-10-CM

## 2022-05-18 DIAGNOSIS — N189 Chronic kidney disease, unspecified: Secondary | ICD-10-CM | POA: Insufficient documentation

## 2022-05-18 DIAGNOSIS — E1151 Type 2 diabetes mellitus with diabetic peripheral angiopathy without gangrene: Secondary | ICD-10-CM | POA: Insufficient documentation

## 2022-05-18 DIAGNOSIS — K219 Gastro-esophageal reflux disease without esophagitis: Secondary | ICD-10-CM | POA: Insufficient documentation

## 2022-05-18 DIAGNOSIS — Z87891 Personal history of nicotine dependence: Secondary | ICD-10-CM | POA: Diagnosis not present

## 2022-05-18 DIAGNOSIS — E1122 Type 2 diabetes mellitus with diabetic chronic kidney disease: Secondary | ICD-10-CM | POA: Diagnosis not present

## 2022-05-18 DIAGNOSIS — I10 Essential (primary) hypertension: Secondary | ICD-10-CM | POA: Diagnosis not present

## 2022-05-18 DIAGNOSIS — I252 Old myocardial infarction: Secondary | ICD-10-CM | POA: Insufficient documentation

## 2022-05-18 DIAGNOSIS — I130 Hypertensive heart and chronic kidney disease with heart failure and stage 1 through stage 4 chronic kidney disease, or unspecified chronic kidney disease: Secondary | ICD-10-CM | POA: Insufficient documentation

## 2022-05-18 DIAGNOSIS — Z85048 Personal history of other malignant neoplasm of rectum, rectosigmoid junction, and anus: Secondary | ICD-10-CM | POA: Insufficient documentation

## 2022-05-18 DIAGNOSIS — I5022 Chronic systolic (congestive) heart failure: Secondary | ICD-10-CM | POA: Diagnosis not present

## 2022-05-18 DIAGNOSIS — I2510 Atherosclerotic heart disease of native coronary artery without angina pectoris: Secondary | ICD-10-CM | POA: Insufficient documentation

## 2022-05-18 DIAGNOSIS — C20 Malignant neoplasm of rectum: Secondary | ICD-10-CM

## 2022-05-18 DIAGNOSIS — M109 Gout, unspecified: Secondary | ICD-10-CM | POA: Insufficient documentation

## 2022-05-18 NOTE — Patient Instructions (Signed)
Continue weighing daily and call for an overnight weight gain of 3 pounds or more or a weekly weight gain of more than 5 pounds.   If you have voicemail, please make sure your mailbox is cleaned out so that we may leave a message and please make sure to listen to any voicemails.     

## 2022-05-19 ENCOUNTER — Telehealth: Payer: Self-pay | Admitting: Oncology

## 2022-05-19 NOTE — Telephone Encounter (Signed)
Wife called and said that [name] [name] has not had his colonoscopy yet and the appointment with Dr. Tasia Catchings needs to be cancelled. I called and spoke to the wife about rescheduling, she stated the colonoscopy has not been scheduled yet but she will call to reschedule the appointment with Dr. Tasia Catchings once it is done.

## 2022-05-19 NOTE — Progress Notes (Signed)
[facility] - PHARMACIST COUNSELING NOTE  Guideline-Directed Medical Therapy/Evidence Based Medicine  ACE/ARB/ARNI: Losartan 25 mg daily (currently on hold) Beta Blocker: Carvedilol 3.125 mg twice daily (currently on hold) Aldosterone Antagonist: Spironolactone 12.5 mg daily Diuretic: Furosemide 40 mg daily SGLT2i:  None  Adherence Assessment  Do you ever forget to take your medication? '[x]'$ Yes '[]'$ No  Do you ever skip doses due to side effects? '[x]'$ Yes '[]'$ No  Do you have trouble affording your medicines? '[]'$ Yes '[x]'$ No  Are you ever unable to pick up your medication due to transportation difficulties? '[]'$ Yes '[x]'$ No  Do you ever stop taking your medications because you don't believe they are helping? '[]'$ Yes '[x]'$ No  Do you check your weight daily? '[x]'$ Yes '[]'$ No   Adherence strategy: Pill box and wife helps with medications  Barriers to obtaining medications: None  Vital signs: HR 69, BP 96/64, weight (pounds) 151 ECHO: Date 04/2022, EF <20%, notes moderate pleural effusion and moderate MR/TR     Latest Ref Rng & Units 05/13/2022    8:39 AM 05/12/2022    6:05 AM 05/11/2022    5:21 AM  BMP  Glucose 70 - 99 mg/dL 106  73  76   BUN 8 - 23 mg/dL 32  27  24   Creatinine 0.61 - 1.24 mg/dL 1.22  1.25  1.16   Sodium 135 - 145 mmol/L 136  138  139   Potassium 3.5 - 5.1 mmol/L 4.2  3.6  3.5   Chloride 98 - 111 mmol/L 94  96  100   CO2 22 - 32 mmol/L 34  29  29   Calcium 8.9 - 10.3 mg/dL 9.3  9.2  8.7     Past Medical History:  Diagnosis Date   Atherosclerosis    Cancer (HCC)    CHF (congestive heart failure) (HCC)    Chronic kidney disease    kidney stones, UTI   Colon cancer (HCC)    Coronary artery disease    Diabetes mellitus without complication (HCC)    GERD (gastroesophageal reflux disease)    Gout    Hematuria    Hyperlipidemia    Hypertension    Iron deficiency anemia    Myocardial infarction [facility]) 2005   Peripheral vascular  disease (HCC)    Pulmonary nodules    Rectal cancer (HCC)    Ureter, stricture    Wears dentures    full upper    ASSESSMENT 67 year old male with PMH HTN, CAD, CKD (eGFR >60), DM, HLD, h/o rectal cancer who presents to the HF clinic as a new patient with a CC today of pedal edema. Most recent ECHO in 04/2022 shows EF <20%. Regarding GDMT, patient prescribed losartan 25 mg daily (tried Entresto but caused a bothersome cough), Coreg 3.125 mg twice daily, and spironolactone 12.5 mg daily. Patient also takes furosemide 20 mg daily, which was decreased from 40 mg due to patient losing weight. Since recent hospitalization on 05/05/2022, patient has only been taking spironolactone, furosemide, and colchicine, with all other meds on hold. Patient to see cardiologist next week where multiple held meds will be re-evaluated. Notably, patient's weight has decreased greatly since 02/2022, from 190 lbs down to 151 lbs today. This is attributed mostly to water weight, and patient's kidneys have tolerated this diuresis well with recent eGFR on 05/13/2022 was >60. However, patient's blood pressure has decreased as well, today is 96/64. Patient and wife say that they check his blood pressure daily and  when SBP <100, they skip the spironolactone. All the urination from the furosemide is bothersome for the patient as well, and also limits ability to onboard SGLT2i at this time.  Recent ED Visit (past 6 months): Date - 05/05/2022, CC - Respiratory failure  PLAN CHF/HTN Continue spironolactone and furosemide Whether to resume losartan and Coreg to be determined at cardiologist appointment next week Low BP and bothersome diuresis limits ability to onboard further GDMT at this point  HLD/CAD w/ STEMI (2005) Recommend resuming pravastatin Recommend obtaining lipid panel with next BMP LDL unknown   Time spent: 20 minutes  Will M. Ouida Sills, PharmD PGY-1 Pharmacy Resident 05/19/2022 9:24 PM   Current Outpatient  Medications:    acetaminophen (TYLENOL) 500 MG tablet, Take 500 mg by mouth every 6 (six) hours as needed., Disp: , Rfl:    Ascorbic Acid (VITAMIN C) 1000 MG tablet, Take 1,000 mg by mouth daily. Reported on 09/26/2015, Disp: , Rfl:    carvedilol (COREG) 3.125 MG tablet, Take 3.125 mg by mouth 2 (two) times daily with a meal. (Patient not taking: Reported on 05/18/2022), Disp: , Rfl:    colchicine 0.6 MG tablet, Take 0.6 mg by mouth daily., Disp: , Rfl:    Ferrous Sulfate (IRON) 325 (65 Fe) MG TABS, Take 1 tablet by mouth every other day. (Patient not taking: Reported on 05/18/2022), Disp: , Rfl:    furosemide (LASIX) 40 MG tablet, Take 1 tablet (40 mg total) by mouth daily. (Patient taking differently: Take 20 mg by mouth daily.), Disp: 30 tablet, Rfl: 0   losartan (COZAAR) 25 MG tablet, Take 25 mg by mouth daily. On hold since hospitalization (Patient not taking: Reported on 05/18/2022), Disp: , Rfl:    pravastatin (PRAVACHOL) 20 MG tablet, Take 20 mg by mouth daily. (Patient not taking: Reported on 05/18/2022), Disp: , Rfl:    spironolactone (ALDACTONE) 25 MG tablet, Take 0.5 tablets (12.5 mg total) by mouth daily. (Patient taking differently: Take 25 mg by mouth daily.), Disp: 30 tablet, Rfl: 0 No current facility-administered medications for this visit.  Facility-Administered Medications Ordered in Other Visits:    heparin lock flush 100 unit/mL, 500 Units, Intravenous, Once, Corcoran, Melissa C, MD   sodium chloride 0.9 % injection 10 mL, 10 mL, Intravenous, PRN, Dallas Schimke, MD, 10 mL at 08/26/14 1300   sodium chloride flush (NS) 0.9 % injection 10 mL, 10 mL, Intravenous, PRN, Corcoran, Melissa C, MD   DRUGS TO CAUTION IN HEART FAILURE  Drug or Class Mechanism  Analgesics NSAIDs COX-2 inhibitors Glucocorticoids  Sodium and water retention, increased systemic vascular resistance, decreased response to diuretics   Diabetes Medications Metformin Thiazolidinediones Rosiglitazone  (Avandia) Pioglitazone (Actos) DPP4 Inhibitors Saxagliptin (Onglyza) Sitagliptin (Januvia)   Lactic acidosis Possible calcium channel blockade   Unknown  Antiarrhythmics Class I  Flecainide Disopyramide Class III Sotalol Other Dronedarone  Negative inotrope, proarrhythmic   Proarrhythmic, beta blockade  Negative inotrope  Antihypertensives Alpha Blockers Doxazosin Calcium Channel Blockers Diltiazem Verapamil Nifedipine Central Alpha Adrenergics Moxonidine Peripheral Vasodilators Minoxidil  Increases renin and aldosterone  Negative inotrope    Possible sympathetic withdrawal  Unknown  Anti-infective Itraconazole Amphotericin B  Negative inotrope Unknown  Hematologic Anagrelide Cilostazol   Possible inhibition of PD IV Inhibition of PD III causing arrhythmias  Neurologic/Psychiatric Stimulants Anti-Seizure Drugs Carbamazepine Pregabalin Antidepressants Tricyclics Citalopram Parkinsons Bromocriptine Pergolide Pramipexole Antipsychotics Clozapine Antimigraine Ergotamine Methysergide Appetite suppressants Bipolar Lithium  Peripheral alpha and beta agonist activity  Negative inotrope and chronotrope Calcium channel blockade  Negative inotrope, proarrhythmic Dose-dependent QT prolongation  Excessive serotonin activity/valvular damage Excessive serotonin activity/valvular damage Unknown  IgE mediated hypersensitivy, calcium channel blockade  Excessive serotonin activity/valvular damage Excessive serotonin activity/valvular damage Valvular damage  Direct myofibrillar degeneration, adrenergic stimulation  Antimalarials Chloroquine Hydroxychloroquine Intracellular inhibition of lysosomal enzymes  Urologic Agents Alpha Blockers Doxazosin Prazosin Tamsulosin Terazosin  Increased renin and aldosterone  Adapted from Page Carleene Overlie, et al. "Drugs That May Cause or Exacerbate Heart Failure: A Scientific Statement from the American Heart   Association." Circulation 2016; 134:e32-e69. DOI: 10.1161/CIR.0000000000000426   MEDICATION ADHERENCES TIPS AND STRATEGIES Taking medication as prescribed improves patient outcomes in heart failure (reduces hospitalizations, improves symptoms, increases survival) Side effects of medications can be managed by decreasing doses, switching agents, stopping drugs, or adding additional therapy. Please let someone in the [facility] know if you have having bothersome side effects so we can modify your regimen. Do not alter your medication regimen without talking to [place].  Medication reminders can help patients remember to take drugs on time. If you are missing or forgetting doses you can try linking behaviors, using pill boxes, or an electronic reminder like an alarm on your phone or an app. Some people can also get automated phone calls as medication reminders.

## 2022-05-21 ENCOUNTER — Inpatient Hospital Stay: Payer: Medicare HMO | Admitting: Oncology

## 2022-05-24 ENCOUNTER — Encounter: Payer: Medicare HMO | Admitting: Family

## 2022-06-16 ENCOUNTER — Ambulatory Visit: Payer: Medicare HMO | Admitting: Gastroenterology

## 2022-06-22 ENCOUNTER — Ambulatory Visit: Payer: Medicare HMO | Attending: Family | Admitting: Family

## 2022-06-22 ENCOUNTER — Encounter: Payer: Self-pay | Admitting: Family

## 2022-06-22 VITALS — BP 117/78 | HR 80 | Resp 16 | Wt 158.1 lb

## 2022-06-22 DIAGNOSIS — I2510 Atherosclerotic heart disease of native coronary artery without angina pectoris: Secondary | ICD-10-CM | POA: Insufficient documentation

## 2022-06-22 DIAGNOSIS — Z85048 Personal history of other malignant neoplasm of rectum, rectosigmoid junction, and anus: Secondary | ICD-10-CM | POA: Insufficient documentation

## 2022-06-22 DIAGNOSIS — I10 Essential (primary) hypertension: Secondary | ICD-10-CM | POA: Diagnosis not present

## 2022-06-22 DIAGNOSIS — E1122 Type 2 diabetes mellitus with diabetic chronic kidney disease: Secondary | ICD-10-CM | POA: Diagnosis present

## 2022-06-22 DIAGNOSIS — I130 Hypertensive heart and chronic kidney disease with heart failure and stage 1 through stage 4 chronic kidney disease, or unspecified chronic kidney disease: Secondary | ICD-10-CM | POA: Diagnosis not present

## 2022-06-22 DIAGNOSIS — I252 Old myocardial infarction: Secondary | ICD-10-CM | POA: Diagnosis not present

## 2022-06-22 DIAGNOSIS — Z87891 Personal history of nicotine dependence: Secondary | ICD-10-CM | POA: Diagnosis not present

## 2022-06-22 DIAGNOSIS — E119 Type 2 diabetes mellitus without complications: Secondary | ICD-10-CM

## 2022-06-22 DIAGNOSIS — M109 Gout, unspecified: Secondary | ICD-10-CM | POA: Diagnosis not present

## 2022-06-22 DIAGNOSIS — K219 Gastro-esophageal reflux disease without esophagitis: Secondary | ICD-10-CM | POA: Diagnosis not present

## 2022-06-22 DIAGNOSIS — Z79899 Other long term (current) drug therapy: Secondary | ICD-10-CM | POA: Diagnosis not present

## 2022-06-22 DIAGNOSIS — I5022 Chronic systolic (congestive) heart failure: Secondary | ICD-10-CM

## 2022-06-22 DIAGNOSIS — C20 Malignant neoplasm of rectum: Secondary | ICD-10-CM

## 2022-06-22 DIAGNOSIS — N189 Chronic kidney disease, unspecified: Secondary | ICD-10-CM | POA: Diagnosis not present

## 2022-06-22 NOTE — Patient Instructions (Signed)
Start the carvedilol as 3.'125mg'$  as 1 tablet in the morning and 1 tablet in the evening

## 2022-06-22 NOTE — Progress Notes (Signed)
Patient ID: [name] SCARPATI [name], male    DOB: Jan 25, 1956, 67 y.o.   MRN: [id number]  HPI  [name] [name] is a 67 y/o male with a history of rectal cancer, CAD, DM, HTN, CKD, GERD, gout, anemia, PVD, pulmonary nodules, previous tobacco use and chronic heart failure.   Echo 05/08/22 showed EF of <20% along with moderate pleural effusion in the left lateral region and moderate [name]/TR.   Admitted 05/05/22 due to SOB, weight gain and pedal edema due to a/c heart failure. Diuresed.   He presents today for a HF follow-up visit with a chief complaint of minimal pedal edema. Describes this as chronic in nature. Has associated occasional difficulty sleeping and gradual weight gain along with this. Denies any dizziness, abdominal distention, palpitations, chest pain, SOB, cough or fatigue.   His wife says that he's eating more but hasn't had any night where he gained 3# or more.   SBP at home has been running 106-117 and she hasn't had to hold his spironolactone at all.  Past Medical History:  Diagnosis Date   Atherosclerosis    Cancer ([place])    CHF (congestive heart failure) (HCC)    Chronic kidney disease    kidney stones, UTI   Colon cancer (HCC)    Coronary artery disease    Diabetes mellitus without complication (HCC)    GERD (gastroesophageal reflux disease)    Gout    Hematuria    Hyperlipidemia    Hypertension    Iron deficiency anemia    Myocardial infarction [facility]) 2005   Peripheral vascular disease (HCC)    Pulmonary nodules    Rectal cancer (HCC)    Ureter, stricture    Wears dentures    full upper   Past Surgical History:  Procedure Laterality Date   BOWEL RESECTION N/A 10/23/2014   Procedure: LOW ANTERIOR BOWEL RESECTION;  Surgeon: Sherri Rad, MD;  Location: [facility];  Service: General;  Laterality: N/A;   COLON SURGERY     COLONOSCOPY 06/06/14     COLONOSCOPY WITH ESOPHAGOGASTRODUODENOSCOPY (EGD)     CORONARY ANGIOPLASTY WITH STENT PLACEMENT  03/2004   CYSTOSCOPY WITH STENT  PLACEMENT Bilateral 10/23/2014   Procedure: CYSTOSCOPY WITH STENT PLACEMENT,URETHRAL DILATION, LEFT RETROGRADE PYELOGRAM, URETEROSCOPY;  Surgeon: Hollice Espy, MD;  Location: [facility];  Service: Urology;  Laterality: Bilateral;   DIVERTING ILEOSTOMY N/A 10/23/2014   Procedure: DIVERTING ILEOSTOMY;  Surgeon: Sherri Rad, MD;  Location: [facility];  Service: General;  Laterality: N/A;   ILEOSTOMY CLOSURE N/A 09/08/2015   Procedure: ILEOSTOMY TAKEDOWN;  Surgeon: Clayburn Pert, MD;  Location: [facility];  Service: General;  Laterality: N/A;   LAPAROTOMY N/A 10/23/2014   Procedure: EXPLORATORY LAPAROTOMY;  Surgeon: Sherri Rad, MD;  Location: [facility];  Service: General;  Laterality: N/A;   PERIPHERAL VASCULAR CATHETERIZATION N/A 05/12/2015   Procedure: Abdominal Aortogram w/Lower Extremity;  Surgeon: Algernon Huxley, MD;  Location: [place] [facility];  Service: Cardiovascular;  Laterality: N/A;   PERIPHERAL VASCULAR CATHETERIZATION  05/12/2015   Procedure: Lower Extremity Intervention;  Surgeon: Algernon Huxley, MD;  Location: [place] [facility];  Service: Cardiovascular;;   PERIPHERAL VASCULAR CATHETERIZATION N/A 06/30/2015   Procedure: Abdominal Aortogram w/Lower Extremity;  Surgeon: Algernon Huxley, MD;  Location: [place] [facility];  Service: Cardiovascular;  Laterality: N/A;   PERIPHERAL VASCULAR CATHETERIZATION  06/30/2015   Procedure: Lower Extremity Intervention;  Surgeon: Algernon Huxley, MD;  Location: [place] [facility];  Service: Cardiovascular;;  PORT-A-CATH REMOVAL Right 09/08/2015   Procedure: REMOVAL PORT-A-CATH;  Surgeon: Clayburn Pert, MD;  Location: [facility];  Service: General;  Laterality: Right;   PORTACATH PLACEMENT  07/01/2014   Dr. Marina Gravel   Family History  Problem Relation Age of Onset   Hypertension Brother    Hypertension Father    Diabetes Father    Diabetes Brother    Hypertension Brother    Heart disease Paternal Grandfather    Social History   Tobacco Use   Smoking status: Former     Types: Cigarettes    Quit date: 11/19/1984    Years since quitting: 37.6   Smokeless tobacco: Never   Tobacco comments:    smoked for brief period in his teens  Substance Use Topics   Alcohol use: No    Alcohol/week: 0.0 standard drinks of alcohol   No Known Allergies  Prior to Admission medications   Medication Sig Start Date End Date Taking? Authorizing Provider  acetaminophen (TYLENOL) 500 MG tablet Take 500 mg by mouth every 6 (six) hours as needed.   Yes [provider]  Ascorbic Acid (VITAMIN C) 1000 MG tablet Take 1,000 mg by mouth daily. Reported on 09/26/2015   Yes [provider]  colchicine 0.6 MG tablet Take 0.6 mg by mouth daily.   Yes [provider]  furosemide (LASIX) 40 MG tablet Take 1 tablet (40 mg total) by mouth daily. Patient taking differently: Take 20 mg by mouth daily. 05/14/22  Yes Lucienne Minks, MD  spironolactone (ALDACTONE) 25 MG tablet Take 0.5 tablets (12.5 mg total) by mouth daily. Patient taking differently: Take 25 mg by mouth daily. 05/13/22  Yes Lucienne Minks, MD  carvedilol (COREG) 3.125 MG tablet Take 3.125 mg by mouth 2 (two) times daily with a meal. Patient not taking: Reported on 05/18/2022    [provider]  Ferrous Sulfate (IRON) 325 (65 Fe) MG TABS Take 1 tablet by mouth every other day. Patient not taking: Reported on 05/18/2022 01/22/22   [provider]  losartan (COZAAR) 25 MG tablet Take 25 mg by mouth daily. On hold since hospitalization Patient not taking: Reported on 05/18/2022    [provider]  pravastatin (PRAVACHOL) 20 MG tablet Take 20 mg by mouth daily. Patient not taking: Reported on 05/18/2022    [provider]   Review of Systems  Constitutional:  Negative for appetite change and fatigue.  HENT:  Negative for congestion, postnasal drip and sore throat.   Eyes: Negative.   Respiratory:  Negative for cough, chest tightness and shortness of breath.   Cardiovascular:   Positive for leg swelling (minimal). Negative for chest pain and palpitations.  Gastrointestinal:  Negative for abdominal distention and abdominal pain.  Endocrine: Negative.   Genitourinary: Negative.   Musculoskeletal:  Negative for back pain and neck pain.  Skin: Negative.   Neurological:  Negative for dizziness and light-headedness.  Hematological:  Negative for adenopathy. Does not bruise/bleed easily.  Psychiatric/Behavioral:  Positive for sleep disturbance (waking up frequently to urinate). Negative for dysphoric mood. The patient is not nervous/anxious.    Vitals:   06/22/22 1343  BP: 117/78  Pulse: 80  Resp: 16  SpO2: 100%  Weight: 158 lb 2 oz (71.7 kg)   Wt Readings from Last 3 Encounters:  06/22/22 158 lb 2 oz (71.7 kg)  05/18/22 151 lb 4 oz (68.6 kg)  05/12/22 168 lb 6.4 oz (76.4 kg)   Lab Results  Component Value Date   CREATININE  1.22 05/13/2022   CREATININE 1.25 (H) 05/12/2022   CREATININE 1.16 05/11/2022   Physical Exam Vitals and nursing note reviewed. Exam conducted with a chaperone present (wife).  Constitutional:      Appearance: Normal appearance.  HENT:     Head: Normocephalic and atraumatic.  Cardiovascular:     Rate and Rhythm: Normal rate and regular rhythm.  Pulmonary:     Effort: Pulmonary effort is normal. No respiratory distress.     Breath sounds: No wheezing, rhonchi or rales.  Abdominal:     General: There is no distension.     Palpations: Abdomen is soft.     Tenderness: There is no abdominal tenderness.  Musculoskeletal:        General: No tenderness.     Cervical back: Normal range of motion and neck supple.     Right lower leg: Edema (trace pitting) present.     Left lower leg: Edema (trace pitting) present.  Skin:    General: Skin is dry.  Neurological:     General: No focal deficit present.     Mental Status: He is alert and oriented to person, place, and time.  Psychiatric:        Behavior: Behavior normal.        Thought  Content: Thought content normal.    Assessment & Plan:  1: Chronic heart failure with reduced ejection fraction- - NYHA class I - euvolemic today  - weighing daily and has noticed gradual weight gain due to increase in appetite; reminded to call for an overnight weight gain of > 2 pounds or a weekly weight gain of > 5 pounds - weight up 7 pounds from last visit here 6 weeks ago - echo 05/08/22: EF of <20% along with moderate pleural effusion in the left lateral region and moderate [name]/TR.  - cath done 2005 (PCI/ stent), 05/2014 - '20mg'$  lasix with additional '20mg'$  as needed for weight gain, worsening swelling or SOB - spironolactone '25mg'$  daily with parameters to hold if SBP<100; she hasn't had to hold this - will resume carvedilol 3.'125mg'$  BID - discussed resuming losartan in the future if BP allows - consider [facility] provider - BNP 05/05/22 was 3983.4  2: HTN with CKD- - BP 117/78 (home SBP runs 106-117) - sees PCP @ [facility] 05/13/22 showed sodium 136, potassium 4.2, creatinine 1.22 & GFR >60  3: DM- - A1c 05/06/22 was 5.5%  4: CAD- - STEMI 2005 - saw cardiology Clayborn Bigness) 06/07/22  5: History of rectal cancer- - saw GI Marius Ditch) 03/10/22 - saw oncology Tasia Catchings) 02/08/22   Medications reviewed.   Return in 1 month, sooner if needed.

## 2022-07-23 ENCOUNTER — Encounter: Payer: Medicare HMO | Admitting: Family

## 2022-08-03 ENCOUNTER — Encounter: Payer: Self-pay | Admitting: Family

## 2022-08-03 ENCOUNTER — Ambulatory Visit: Payer: Medicare HMO | Attending: Family | Admitting: Family

## 2022-08-03 VITALS — BP 114/79 | HR 62 | Wt 159.0 lb

## 2022-08-03 DIAGNOSIS — R059 Cough, unspecified: Secondary | ICD-10-CM | POA: Diagnosis not present

## 2022-08-03 DIAGNOSIS — I130 Hypertensive heart and chronic kidney disease with heart failure and stage 1 through stage 4 chronic kidney disease, or unspecified chronic kidney disease: Secondary | ICD-10-CM | POA: Diagnosis not present

## 2022-08-03 DIAGNOSIS — I081 Rheumatic disorders of both mitral and tricuspid valves: Secondary | ICD-10-CM | POA: Insufficient documentation

## 2022-08-03 DIAGNOSIS — C20 Malignant neoplasm of rectum: Secondary | ICD-10-CM

## 2022-08-03 DIAGNOSIS — J90 Pleural effusion, not elsewhere classified: Secondary | ICD-10-CM | POA: Diagnosis not present

## 2022-08-03 DIAGNOSIS — I10 Essential (primary) hypertension: Secondary | ICD-10-CM | POA: Diagnosis not present

## 2022-08-03 DIAGNOSIS — I5022 Chronic systolic (congestive) heart failure: Secondary | ICD-10-CM

## 2022-08-03 DIAGNOSIS — E119 Type 2 diabetes mellitus without complications: Secondary | ICD-10-CM

## 2022-08-03 DIAGNOSIS — E1122 Type 2 diabetes mellitus with diabetic chronic kidney disease: Secondary | ICD-10-CM | POA: Insufficient documentation

## 2022-08-03 DIAGNOSIS — N189 Chronic kidney disease, unspecified: Secondary | ICD-10-CM | POA: Diagnosis not present

## 2022-08-03 DIAGNOSIS — I252 Old myocardial infarction: Secondary | ICD-10-CM | POA: Diagnosis not present

## 2022-08-03 DIAGNOSIS — I2510 Atherosclerotic heart disease of native coronary artery without angina pectoris: Secondary | ICD-10-CM | POA: Diagnosis not present

## 2022-08-03 DIAGNOSIS — Z85048 Personal history of other malignant neoplasm of rectum, rectosigmoid junction, and anus: Secondary | ICD-10-CM | POA: Insufficient documentation

## 2022-08-03 DIAGNOSIS — Z79899 Other long term (current) drug therapy: Secondary | ICD-10-CM | POA: Insufficient documentation

## 2022-08-03 NOTE — Progress Notes (Signed)
Patient ID: [name] GUM [name], male    DOB: 06-06-1955, 67 y.o.   MRN: [id number]  Primary cardiologist: Dorothyann Peng, MD (last seen 02/24; returns 06/24) PCP: Lorn Junes, FNP (last seen ~ 1 year ago)  HPI  [name] [name] is a 67 y/o male with a history of rectal cancer, CAD/ STEMI 2005, DM, HTN, CKD, GERD, gout, anemia, PVD, pulmonary nodules, previous tobacco use and chronic heart failure.   Echo 05/08/22: EF of <20% along with moderate pleural effusion in the left lateral region and moderate [name]/TR.   LHC done 2016. Cath/ PCI done 2005.  Admitted 05/05/22 due to SOB, weight gain and pedal edema due to a/c heart failure. Diuresed.   He presents today for a HF follow-up visit with a chief complaint of chronic difficulty sleeping. No other symptoms and specifically denies dizziness, abdominal distention, palpitations, pedal edema, chest pain, SOB, cough, fatigue or weight gain.   Resumed his carvedilol 3.125mg  BID at his last visit. His wife says that when he first started it, his BP dropped so she decreased it to once daily for a few days and then resumed it at BID without any further issues. She says that she's only had to hold his spironolactone once since last here because of hypotension.   Is taking 20mg  furosemide daily unless his weight goes up, then he takes the whole 40mg  tablet.   Past Medical History:  Diagnosis Date   Atherosclerosis    Cancer (HCC)    CHF (congestive heart failure) (HCC)    Chronic kidney disease    kidney stones, UTI   Colon cancer (HCC)    Coronary artery disease    Diabetes mellitus without complication (HCC)    GERD (gastroesophageal reflux disease)    Gout    Hematuria    Hyperlipidemia    Hypertension    Iron deficiency anemia    Myocardial infarction [facility]) 2005   Peripheral vascular disease (HCC)    Pulmonary nodules    Rectal cancer (HCC)    Ureter, stricture    Wears dentures    full upper   Past Surgical History:  Procedure  Laterality Date   BOWEL RESECTION N/A 10/23/2014   Procedure: LOW ANTERIOR BOWEL RESECTION;  Surgeon: Natale Lay, MD;  Location: [facility];  Service: General;  Laterality: N/A;   COLON SURGERY     COLONOSCOPY 06/06/14     COLONOSCOPY WITH ESOPHAGOGASTRODUODENOSCOPY (EGD)     CORONARY ANGIOPLASTY WITH STENT PLACEMENT  03/2004   CYSTOSCOPY WITH STENT PLACEMENT Bilateral 10/23/2014   Procedure: CYSTOSCOPY WITH STENT PLACEMENT,URETHRAL DILATION, LEFT RETROGRADE PYELOGRAM, URETEROSCOPY;  Surgeon: Vanna Scotland, MD;  Location: [facility];  Service: Urology;  Laterality: Bilateral;   DIVERTING ILEOSTOMY N/A 10/23/2014   Procedure: DIVERTING ILEOSTOMY;  Surgeon: Natale Lay, MD;  Location: [facility];  Service: General;  Laterality: N/A;   ILEOSTOMY CLOSURE N/A 09/08/2015   Procedure: ILEOSTOMY TAKEDOWN;  Surgeon: Ricarda Frame, MD;  Location: [facility];  Service: General;  Laterality: N/A;   LAPAROTOMY N/A 10/23/2014   Procedure: EXPLORATORY LAPAROTOMY;  Surgeon: Natale Lay, MD;  Location: [facility];  Service: General;  Laterality: N/A;   PERIPHERAL VASCULAR CATHETERIZATION N/A 05/12/2015   Procedure: Abdominal Aortogram w/Lower Extremity;  Surgeon: Annice Needy, MD;  Location: [facility];  Service: Cardiovascular;  Laterality: N/A;   PERIPHERAL VASCULAR CATHETERIZATION  05/12/2015   Procedure: Lower Extremity Intervention;  Surgeon: Annice Needy, MD;  Location: [facility];  Service: Cardiovascular;;   PERIPHERAL VASCULAR CATHETERIZATION N/A 06/30/2015   Procedure: Abdominal Aortogram w/Lower Extremity;  Surgeon: Annice Needy, MD;  Location: [facility];  Service: Cardiovascular;  Laterality: N/A;   PERIPHERAL VASCULAR CATHETERIZATION  06/30/2015   Procedure: Lower Extremity Intervention;  Surgeon: Annice Needy, MD;  Location: [facility];  Service: Cardiovascular;;   PORT-A-CATH REMOVAL Right 09/08/2015   Procedure: REMOVAL PORT-A-CATH;  Surgeon: Ricarda Frame, MD;  Location: [facility];   Service: General;  Laterality: Right;   PORTACATH PLACEMENT  07/01/2014   Dr. Egbert Garibaldi   Family History  Problem Relation Age of Onset   Hypertension Brother    Hypertension Father    Diabetes Father    Diabetes Brother    Hypertension Brother    Heart disease Paternal Grandfather    Social History   Tobacco Use   Smoking status: Former    Types: Cigarettes    Quit date: 11/19/1984    Years since quitting: 37.7   Smokeless tobacco: Never   Tobacco comments:    smoked for brief period in his teens  Substance Use Topics   Alcohol use: No    Alcohol/week: 0.0 standard drinks of alcohol   No Known Allergies  Prior to Admission medications   Medication Sig Start Date End Date Taking? Authorizing Provider  acetaminophen (TYLENOL) 500 MG tablet Take 500 mg by mouth every 6 (six) hours as needed.   Yes [provider]  Ascorbic Acid (VITAMIN C) 1000 MG tablet Take 1,000 mg by mouth daily. Reported on 09/26/2015   Yes [provider]  carvedilol (COREG) 3.125 MG tablet Take 3.125 mg by mouth 2 (two) times daily with a meal.   Yes [provider]  colchicine 0.6 MG tablet Take 0.6 mg by mouth daily.   Yes [provider]  furosemide (LASIX) 40 MG tablet Take 1 tablet (40 mg total) by mouth daily. Patient taking differently: Take 20 mg by mouth daily. 05/14/22  Yes Baron Hamper, MD  spironolactone (ALDACTONE) 25 MG tablet Take 0.5 tablets (12.5 mg total) by mouth daily. Patient taking differently: Take 25 mg by mouth daily. 05/13/22  Yes Baron Hamper, MD  Ferrous Sulfate (IRON) 325 (65 Fe) MG TABS Take 1 tablet by mouth every other day. Patient not taking: Reported on 05/18/2022 01/22/22   [provider]  losartan (COZAAR) 25 MG tablet Take 25 mg by mouth daily. On hold since hospitalization Patient not taking: Reported on 05/18/2022    [provider]  pravastatin (PRAVACHOL) 20 MG tablet Take 20 mg by mouth daily. Patient not taking:  Reported on 05/18/2022    [provider]   Review of Systems  Constitutional:  Negative for appetite change and fatigue.  HENT:  Negative for congestion, postnasal drip and sore throat.   Eyes: Negative.   Respiratory:  Negative for cough, chest tightness and shortness of breath.   Cardiovascular:  Negative for chest pain, palpitations and leg swelling.  Gastrointestinal:  Negative for abdominal distention and abdominal pain.  Endocrine: Negative.   Genitourinary: Negative.   Musculoskeletal:  Negative for back pain and neck pain.  Skin: Negative.   Neurological:  Negative for dizziness and light-headedness.  Hematological:  Negative for adenopathy. Does not bruise/bleed easily.  Psychiatric/Behavioral:  Positive for sleep disturbance (waking up frequently to urinate). Negative for dysphoric mood. The patient is not nervous/anxious.    Vitals:   08/03/22 1258  BP: 114/79  Pulse: 62  SpO2:  100%  Weight: 159 lb (72.1 kg)   Wt Readings from Last 3 Encounters:  08/03/22 159 lb (72.1 kg)  06/22/22 158 lb 2 oz (71.7 kg)  05/18/22 151 lb 4 oz (68.6 kg)   Lab Results  Component Value Date   CREATININE 1.22 05/13/2022   CREATININE 1.25 (H) 05/12/2022   CREATININE 1.16 05/11/2022   Physical Exam Vitals and nursing note reviewed. Exam conducted with a chaperone present (wife).  Constitutional:      Appearance: Normal appearance.  HENT:     Head: Normocephalic and atraumatic.  Cardiovascular:     Rate and Rhythm: Normal rate and regular rhythm.  Pulmonary:     Effort: Pulmonary effort is normal. No respiratory distress.     Breath sounds: No wheezing, rhonchi or rales.  Abdominal:     General: There is no distension.     Palpations: Abdomen is soft.     Tenderness: There is no abdominal tenderness.  Musculoskeletal:        General: No tenderness.     Cervical back: Normal range of motion and neck supple.     Right lower leg: Edema (trace pitting) present.     Left  lower leg: No edema.  Skin:    General: Skin is dry.  Neurological:     General: No focal deficit present.     Mental Status: He is alert and oriented to person, place, and time.  Psychiatric:        Behavior: Behavior normal.        Thought Content: Thought content normal.    Assessment & Plan:  1: Ischemic heart failure with reduced ejection fraction- - NYHA class I - euvolemic today  - weighing daily; reminded to call for an overnight weight gain of > 2 pounds or a weekly weight gain of > 5 pounds - weight stable from last visit here 6 weeks ago - echo 05/08/22: EF of <20% along with moderate pleural effusion in the left lateral region and moderate [name]/TR.  - continue  lasix with additional  as needed for weight gain, worsening swelling or SOB - continue spironolactone  daily with parameters to hold if SBP<100; has only had to hold it once since last here - continue carvedilol 3.125mg  BID - reviewed beginning SLGT2 and rationale for this; if we start this, would use his furosemide PRN; he would like to think about this and we can revisit this at his next visit if not addressed elsewhere - reports having a cough with both entresto and lisinopril - losartan remains on hold  - BNP 05/05/22 was 3983.4  2: HTN with CKD- - BP 114/79 - sees PCP @ [facility]  - BMP 05/13/22 showed sodium 136, potassium 4.2, creatinine 1.22 & GFR >60  3: DM- - A1c 05/06/22 was 5.5%  4: CAD- - STEMI 2005 - cath done (PCI/ stent) 2005 & cath done 2016 - saw cardiology Juliann Pares) 02/24  5: History of rectal cancer- - saw GI Allegra Lai) 03/10/22 - saw oncology Cathie Hoops) 02/08/22  Return in 3 months, sooner if needed.

## 2022-08-03 NOTE — Patient Instructions (Signed)
Consider adding either jardiance or farxiga

## 2022-11-02 ENCOUNTER — Encounter: Payer: Self-pay | Admitting: Family

## 2022-11-02 ENCOUNTER — Ambulatory Visit: Payer: Medicare HMO | Attending: Family | Admitting: Family

## 2022-11-02 VITALS — BP 136/81 | HR 58 | Wt 162.2 lb

## 2022-11-02 DIAGNOSIS — N189 Chronic kidney disease, unspecified: Secondary | ICD-10-CM | POA: Diagnosis not present

## 2022-11-02 DIAGNOSIS — E119 Type 2 diabetes mellitus without complications: Secondary | ICD-10-CM

## 2022-11-02 DIAGNOSIS — Z833 Family history of diabetes mellitus: Secondary | ICD-10-CM | POA: Insufficient documentation

## 2022-11-02 DIAGNOSIS — R911 Solitary pulmonary nodule: Secondary | ICD-10-CM | POA: Insufficient documentation

## 2022-11-02 DIAGNOSIS — I130 Hypertensive heart and chronic kidney disease with heart failure and stage 1 through stage 4 chronic kidney disease, or unspecified chronic kidney disease: Secondary | ICD-10-CM | POA: Insufficient documentation

## 2022-11-02 DIAGNOSIS — I252 Old myocardial infarction: Secondary | ICD-10-CM | POA: Insufficient documentation

## 2022-11-02 DIAGNOSIS — I5022 Chronic systolic (congestive) heart failure: Secondary | ICD-10-CM | POA: Insufficient documentation

## 2022-11-02 DIAGNOSIS — J90 Pleural effusion, not elsewhere classified: Secondary | ICD-10-CM | POA: Diagnosis not present

## 2022-11-02 DIAGNOSIS — Z8249 Family history of ischemic heart disease and other diseases of the circulatory system: Secondary | ICD-10-CM | POA: Diagnosis not present

## 2022-11-02 DIAGNOSIS — K219 Gastro-esophageal reflux disease without esophagitis: Secondary | ICD-10-CM | POA: Insufficient documentation

## 2022-11-02 DIAGNOSIS — E1122 Type 2 diabetes mellitus with diabetic chronic kidney disease: Secondary | ICD-10-CM | POA: Insufficient documentation

## 2022-11-02 DIAGNOSIS — Z87891 Personal history of nicotine dependence: Secondary | ICD-10-CM | POA: Insufficient documentation

## 2022-11-02 DIAGNOSIS — Z79899 Other long term (current) drug therapy: Secondary | ICD-10-CM | POA: Insufficient documentation

## 2022-11-02 DIAGNOSIS — I10 Essential (primary) hypertension: Secondary | ICD-10-CM

## 2022-11-02 DIAGNOSIS — M109 Gout, unspecified: Secondary | ICD-10-CM | POA: Insufficient documentation

## 2022-11-02 DIAGNOSIS — Z85048 Personal history of other malignant neoplasm of rectum, rectosigmoid junction, and anus: Secondary | ICD-10-CM | POA: Diagnosis not present

## 2022-11-02 DIAGNOSIS — G479 Sleep disorder, unspecified: Secondary | ICD-10-CM | POA: Insufficient documentation

## 2022-11-02 DIAGNOSIS — I2510 Atherosclerotic heart disease of native coronary artery without angina pectoris: Secondary | ICD-10-CM | POA: Insufficient documentation

## 2022-11-02 DIAGNOSIS — C20 Malignant neoplasm of rectum: Secondary | ICD-10-CM

## 2022-11-02 NOTE — Progress Notes (Signed)
PCP: Lorn Junes, FNP (last seen ~ 1 year ago) Primary Cardiologist: Dorothyann Peng, MD (last seen 06/24)  HPI:  [name] [name] is a 67 y/o male with a history of rectal cancer, CAD/ STEMI 2005, DM, HTN, CKD, GERD, gout, anemia, PVD, pulmonary nodules, previous tobacco use and chronic heart failure.   Echo 05/08/22: EF of <20% along with moderate pleural effusion in the left lateral region and moderate [name]/TR.   LHC done 2016. Cath/ PCI done 2005.  Admitted 05/05/22 due to SOB, weight gain and pedal edema due to a/c heart failure. Diuresed.   He presents today for a HF follow-up visit with a chief complaint of difficulty sleeping. Chronic in nature. Has no other symptoms and specifically denies fatigue, SOB, cough, chest pain, palpitations, abdominal distention, pedal edema, dizziness or weight gain.   Occasionally has to hold his spironolactone due to his BP but his wife says that this is on a rare occasion. He did walk into the office today and last time, he came up in a wheelchair.   ROS: All systems negative except as listed in HPI, PMH and Problem List.  SH:  Social History   Socioeconomic History   Marital status: Married    Spouse name: Not on file   Number of children: Not on file   Years of education: Not on file   Highest education level: Not on file  Occupational History   Not on file  Tobacco Use   Smoking status: Former    Current packs/day: 0.00    Types: Cigarettes    Quit date: 11/19/1984    Years since quitting: 37.9   Smokeless tobacco: Never   Tobacco comments:    smoked for brief period in his teens  Substance and Sexual Activity   Alcohol use: No    Alcohol/week: 0.0 standard drinks of alcohol   Drug use: No   Sexual activity: Not on file  Other Topics Concern   Not on file  Social History Narrative   Not on file   Social Determinants of Health   Financial Resource Strain: Not on file  Food Insecurity: No Food Insecurity (05/07/2022)   Hunger  Vital Sign    Worried About Running Out of Food in the Last Year: Never true    Ran Out of Food in the Last Year: Never true  Transportation Needs: No Transportation Needs (05/07/2022)   PRAPARE - Administrator, Civil Service (Medical): No    Lack of Transportation (Non-Medical): No  Physical Activity: Not on file  Stress: Not on file  Social Connections: Not on file  Intimate Partner Violence: Not At Risk (05/07/2022)   Humiliation, Afraid, Rape, and Kick questionnaire    Fear of Current or Ex-Partner: No    Emotionally Abused: No    Physically Abused: No    Sexually Abused: No    FH:  Family History  Problem Relation Age of Onset   Hypertension Brother    Hypertension Father    Diabetes Father    Diabetes Brother    Hypertension Brother    Heart disease Paternal Grandfather     Past Medical History:  Diagnosis Date   Atherosclerosis    Cancer (HCC)    CHF (congestive heart failure) (HCC)    Chronic kidney disease    kidney stones, UTI   Colon cancer (HCC)    Coronary artery disease    Diabetes mellitus without complication (HCC)    GERD (gastroesophageal reflux disease)  Gout    Hematuria    Hyperlipidemia    Hypertension    Iron deficiency anemia    Myocardial infarction [facility]) 2005   Peripheral vascular disease (HCC)    Pulmonary nodules    Rectal cancer (HCC)    Ureter, stricture    Wears dentures    full upper    Current Outpatient Medications  Medication Sig Dispense Refill   acetaminophen (TYLENOL) 500 MG tablet Take 500 mg by mouth every 6 (six) hours as needed.     Ascorbic Acid (VITAMIN C) 1000 MG tablet Take 1,000 mg by mouth daily. Reported on 09/26/2015     carvedilol (COREG) 3.125 MG tablet Take 3.125 mg by mouth 2 (two) times daily with a meal.     colchicine 0.6 MG tablet Take 0.6 mg by mouth daily.     furosemide (LASIX) 40 MG tablet Take 1 tablet (40 mg total) by mouth daily. (Patient taking differently: Take 20 mg by mouth  daily.) 30 tablet 0   spironolactone (ALDACTONE) 25 MG tablet Take 0.5 tablets (12.5 mg total) by mouth daily. (Patient taking differently: Take 25 mg by mouth daily.) 30 tablet 0   Ferrous Sulfate (IRON) 325 (65 Fe) MG TABS Take 1 tablet by mouth every other day. (Patient not taking: Reported on 05/18/2022)     losartan (COZAAR) 25 MG tablet Take 25 mg by mouth daily. On hold since hospitalization (Patient not taking: Reported on 05/18/2022)     pravastatin (PRAVACHOL) 20 MG tablet Take 20 mg by mouth daily. (Patient not taking: Reported on 05/18/2022)     No current facility-administered medications for this visit.   Facility-Administered Medications Ordered in Other Visits  Medication Dose Route Frequency Provider Last Rate Last Admin   heparin lock flush 100 unit/mL  500 Units Intravenous Once Corcoran, Melissa C, MD       sodium chloride 0.9 % injection 10 mL  10 mL Intravenous PRN Marin Roberts, MD   10 mL at 08/26/14 1300   sodium chloride flush (NS) 0.9 % injection 10 mL  10 mL Intravenous PRN Nelva Nay C, MD        Vitals:   11/02/22 1042  BP: 136/81  Pulse: (!) 58  SpO2: 100%  Weight: 162 lb 3.2 oz (73.6 kg)   Wt Readings from Last 3 Encounters:  11/02/22 162 lb 3.2 oz (73.6 kg)  08/03/22 159 lb (72.1 kg)  06/22/22 158 lb 2 oz (71.7 kg)   Lab Results  Component Value Date   CREATININE 1.22 05/13/2022   CREATININE 1.25 (H) 05/12/2022   CREATININE 1.16 05/11/2022    PHYSICAL EXAM:  General:  Well appearing. No resp difficulty HEENT: normal Neck: supple. JVP flat. No lymphadenopathy or thryomegaly appreciated. Cor: PMI normal. Regular rhythm, bradycardic. No rubs, gallops or murmurs. Lungs: clear Abdomen: soft, nontender, nondistended. No hepatosplenomegaly. No bruits or masses.  Extremities: no cyanosis, clubbing, rash, edema Neuro: alert & oriented x3, cranial nerves grossly intact. Moves all 4 extremities w/o difficulty. Affect pleasant.   ECG: not  done   ASSESSMENT & PLAN:  1: Ischemic heart failure with reduced ejection fraction- - NYHA class I - euvolemic today  - weighing daily; reminded to call for an overnight weight gain of > 2 pounds or a weekly weight gain of > 5 pounds - weight up 3 pounds from last visit here 3 months ago - echo 05/08/22: EF of <20% along with moderate pleural effusion in the left lateral region  and moderate [name]/TR.  - continue 20mg  lasix with additional 20mg  as needed for weight gain, worsening swelling or SOB - continue spironolactone 25mg  daily with parameters to hold if SBP<100; rarely has to hold it now - continue carvedilol 3.125mg  BID - reviewed, again, using SGLT2 but they are concerned about the cost; will reach out to PharmD and see if they're eligible for any assistance; asked if they wanted me to call once we found out but they said to wait until their next appt - reports having a cough with both entresto and lisinopril - losartan remains on hold & BP outside of the office still has SBP in the low 100's so will defer this for now - BNP 05/05/22 was 3983.4  2: HTN with CKD- - BP 136/81 but this is after he walked up to the office today - sees PCP @ [facility]  - BMP 05/13/22 showed sodium 136, potassium 4.2, creatinine 1.22 & GFR >60 - update BMP at next visit if not done elsewhere  3: DM- - A1c 05/06/22 was 5.5%  4: CAD- - STEMI 2005 - cath done (PCI/ stent) 2005 & cath done 2016 - saw cardiology Juliann Pares) 06/24  5: History of rectal cancer- - saw GI Allegra Lai) 11/23 - saw oncology Cathie Hoops) 10/23  Return in 6 months, sooner if needed.

## 2022-12-28 ENCOUNTER — Other Ambulatory Visit (HOSPITAL_COMMUNITY): Payer: Self-pay | Admitting: Nephrology

## 2022-12-28 DIAGNOSIS — R8290 Unspecified abnormal findings in urine: Secondary | ICD-10-CM

## 2022-12-28 DIAGNOSIS — R809 Proteinuria, unspecified: Secondary | ICD-10-CM

## 2022-12-28 DIAGNOSIS — N1831 Chronic kidney disease, stage 3a: Secondary | ICD-10-CM

## 2022-12-30 ENCOUNTER — Ambulatory Visit: Admission: RE | Admit: 2022-12-30 | Payer: Medicare HMO | Source: Ambulatory Visit

## 2023-01-18 ENCOUNTER — Ambulatory Visit
Admission: RE | Admit: 2023-01-18 | Discharge: 2023-01-18 | Disposition: A | Discharge status: Home / Self Care | Payer: Medicare HMO | Source: Ambulatory Visit | Attending: Nephrology | Admitting: Nephrology

## 2023-01-18 DIAGNOSIS — R809 Proteinuria, unspecified: Secondary | ICD-10-CM

## 2023-01-18 DIAGNOSIS — R8290 Unspecified abnormal findings in urine: Secondary | ICD-10-CM | POA: Diagnosis present

## 2023-01-18 DIAGNOSIS — N1831 Chronic kidney disease, stage 3a: Secondary | ICD-10-CM | POA: Diagnosis present

## 2023-04-20 DIAGNOSIS — N35919 Unspecified urethral stricture, male, unspecified site: Secondary | ICD-10-CM

## 2023-04-20 HISTORY — DX: Unspecified urethral stricture, male, unspecified site: N35.919

## 2023-07-04 ENCOUNTER — Other Ambulatory Visit: Payer: Self-pay

## 2023-07-04 ENCOUNTER — Encounter: Payer: Self-pay | Admitting: Emergency Medicine

## 2023-07-04 ENCOUNTER — Emergency Department

## 2023-07-04 ENCOUNTER — Inpatient Hospital Stay
Admission: EM | Admit: 2023-07-04 | Discharge: 2023-07-22 | DRG: 240 | Disposition: A | Discharge status: Skilled Nursing Facility | Attending: Student | Admitting: Student

## 2023-07-04 DIAGNOSIS — M868X7 Other osteomyelitis, ankle and foot: Secondary | ICD-10-CM | POA: Diagnosis present

## 2023-07-04 DIAGNOSIS — E785 Hyperlipidemia, unspecified: Secondary | ICD-10-CM | POA: Diagnosis present

## 2023-07-04 DIAGNOSIS — R319 Hematuria, unspecified: Secondary | ICD-10-CM | POA: Diagnosis not present

## 2023-07-04 DIAGNOSIS — I70222 Atherosclerosis of native arteries of extremities with rest pain, left leg: Secondary | ICD-10-CM | POA: Diagnosis present

## 2023-07-04 DIAGNOSIS — E1122 Type 2 diabetes mellitus with diabetic chronic kidney disease: Secondary | ICD-10-CM | POA: Diagnosis present

## 2023-07-04 DIAGNOSIS — E860 Dehydration: Secondary | ICD-10-CM | POA: Diagnosis present

## 2023-07-04 DIAGNOSIS — N35912 Unspecified bulbous urethral stricture, male: Secondary | ICD-10-CM | POA: Diagnosis not present

## 2023-07-04 DIAGNOSIS — I130 Hypertensive heart and chronic kidney disease with heart failure and stage 1 through stage 4 chronic kidney disease, or unspecified chronic kidney disease: Secondary | ICD-10-CM | POA: Diagnosis present

## 2023-07-04 DIAGNOSIS — Z79899 Other long term (current) drug therapy: Secondary | ICD-10-CM | POA: Diagnosis not present

## 2023-07-04 DIAGNOSIS — I252 Old myocardial infarction: Secondary | ICD-10-CM

## 2023-07-04 DIAGNOSIS — E782 Mixed hyperlipidemia: Secondary | ICD-10-CM | POA: Diagnosis not present

## 2023-07-04 DIAGNOSIS — I70219 Atherosclerosis of native arteries of extremities with intermittent claudication, unspecified extremity: Secondary | ICD-10-CM | POA: Diagnosis not present

## 2023-07-04 DIAGNOSIS — R197 Diarrhea, unspecified: Secondary | ICD-10-CM | POA: Diagnosis present

## 2023-07-04 DIAGNOSIS — Z7902 Long term (current) use of antithrombotics/antiplatelets: Secondary | ICD-10-CM

## 2023-07-04 DIAGNOSIS — Z9889 Other specified postprocedural states: Secondary | ICD-10-CM | POA: Diagnosis not present

## 2023-07-04 DIAGNOSIS — I429 Cardiomyopathy, unspecified: Secondary | ICD-10-CM

## 2023-07-04 DIAGNOSIS — I10 Essential (primary) hypertension: Secondary | ICD-10-CM | POA: Diagnosis not present

## 2023-07-04 DIAGNOSIS — M109 Gout, unspecified: Secondary | ICD-10-CM | POA: Diagnosis present

## 2023-07-04 DIAGNOSIS — N281 Cyst of kidney, acquired: Secondary | ICD-10-CM | POA: Diagnosis present

## 2023-07-04 DIAGNOSIS — Z955 Presence of coronary angioplasty implant and graft: Secondary | ICD-10-CM

## 2023-07-04 DIAGNOSIS — I5020 Unspecified systolic (congestive) heart failure: Secondary | ICD-10-CM | POA: Diagnosis not present

## 2023-07-04 DIAGNOSIS — I708 Atherosclerosis of other arteries: Secondary | ICD-10-CM | POA: Diagnosis not present

## 2023-07-04 DIAGNOSIS — I2510 Atherosclerotic heart disease of native coronary artery without angina pectoris: Secondary | ICD-10-CM | POA: Diagnosis present

## 2023-07-04 DIAGNOSIS — E1169 Type 2 diabetes mellitus with other specified complication: Secondary | ICD-10-CM | POA: Diagnosis present

## 2023-07-04 DIAGNOSIS — N3289 Other specified disorders of bladder: Secondary | ICD-10-CM | POA: Diagnosis not present

## 2023-07-04 DIAGNOSIS — I5022 Chronic systolic (congestive) heart failure: Secondary | ICD-10-CM | POA: Diagnosis present

## 2023-07-04 DIAGNOSIS — M869 Osteomyelitis, unspecified: Secondary | ICD-10-CM | POA: Diagnosis not present

## 2023-07-04 DIAGNOSIS — Z7984 Long term (current) use of oral hypoglycemic drugs: Secondary | ICD-10-CM | POA: Diagnosis not present

## 2023-07-04 DIAGNOSIS — E1152 Type 2 diabetes mellitus with diabetic peripheral angiopathy with gangrene: Principal | ICD-10-CM | POA: Diagnosis present

## 2023-07-04 DIAGNOSIS — K219 Gastro-esophageal reflux disease without esophagitis: Secondary | ICD-10-CM | POA: Diagnosis present

## 2023-07-04 DIAGNOSIS — D509 Iron deficiency anemia, unspecified: Secondary | ICD-10-CM | POA: Diagnosis present

## 2023-07-04 DIAGNOSIS — E119 Type 2 diabetes mellitus without complications: Secondary | ICD-10-CM

## 2023-07-04 DIAGNOSIS — Z833 Family history of diabetes mellitus: Secondary | ICD-10-CM | POA: Diagnosis not present

## 2023-07-04 DIAGNOSIS — R339 Retention of urine, unspecified: Secondary | ICD-10-CM | POA: Diagnosis not present

## 2023-07-04 DIAGNOSIS — N179 Acute kidney failure, unspecified: Secondary | ICD-10-CM | POA: Insufficient documentation

## 2023-07-04 DIAGNOSIS — N183 Chronic kidney disease, stage 3 unspecified: Secondary | ICD-10-CM | POA: Diagnosis present

## 2023-07-04 DIAGNOSIS — Z8249 Family history of ischemic heart disease and other diseases of the circulatory system: Secondary | ICD-10-CM

## 2023-07-04 DIAGNOSIS — T82858A Stenosis of vascular prosthetic devices, implants and grafts, initial encounter: Secondary | ICD-10-CM | POA: Diagnosis not present

## 2023-07-04 DIAGNOSIS — D508 Other iron deficiency anemias: Secondary | ICD-10-CM | POA: Diagnosis not present

## 2023-07-04 DIAGNOSIS — Z85048 Personal history of other malignant neoplasm of rectum, rectosigmoid junction, and anus: Secondary | ICD-10-CM | POA: Diagnosis present

## 2023-07-04 DIAGNOSIS — Z87891 Personal history of nicotine dependence: Secondary | ICD-10-CM

## 2023-07-04 DIAGNOSIS — N200 Calculus of kidney: Secondary | ICD-10-CM | POA: Diagnosis present

## 2023-07-04 DIAGNOSIS — I70262 Atherosclerosis of native arteries of extremities with gangrene, left leg: Secondary | ICD-10-CM | POA: Diagnosis present

## 2023-07-04 DIAGNOSIS — Z7982 Long term (current) use of aspirin: Secondary | ICD-10-CM | POA: Diagnosis not present

## 2023-07-04 DIAGNOSIS — E876 Hypokalemia: Secondary | ICD-10-CM | POA: Diagnosis not present

## 2023-07-04 DIAGNOSIS — Z89432 Acquired absence of left foot: Secondary | ICD-10-CM | POA: Diagnosis not present

## 2023-07-04 DIAGNOSIS — Z888 Allergy status to other drugs, medicaments and biological substances status: Secondary | ICD-10-CM

## 2023-07-04 DIAGNOSIS — Z923 Personal history of irradiation: Secondary | ICD-10-CM

## 2023-07-04 LAB — CBC WITH DIFFERENTIAL/PLATELET
Abs Immature Granulocytes: 0.05 10*3/uL (ref 0.00–0.07)
Basophils Absolute: 0 10*3/uL (ref 0.0–0.1)
Basophils Relative: 0 %
Eosinophils Absolute: 0.2 10*3/uL (ref 0.0–0.5)
Eosinophils Relative: 1 %
HCT: 35 % — ABNORMAL LOW (ref 39.0–52.0)
Hemoglobin: 10.8 g/dL — ABNORMAL LOW (ref 13.0–17.0)
Immature Granulocytes: 1 %
Lymphocytes Relative: 8 %
Lymphs Abs: 0.8 10*3/uL (ref 0.7–4.0)
MCH: 25.4 pg — ABNORMAL LOW (ref 26.0–34.0)
MCHC: 30.9 g/dL (ref 30.0–36.0)
MCV: 82.4 fL (ref 80.0–100.0)
Monocytes Absolute: 0.8 10*3/uL (ref 0.1–1.0)
Monocytes Relative: 8 %
Neutro Abs: 8.7 10*3/uL — ABNORMAL HIGH (ref 1.7–7.7)
Neutrophils Relative %: 82 %
Platelets: 335 10*3/uL (ref 150–400)
RBC: 4.25 MIL/uL (ref 4.22–5.81)
RDW: 14.6 % (ref 11.5–15.5)
WBC: 10.6 10*3/uL — ABNORMAL HIGH (ref 4.0–10.5)
nRBC: 0 % (ref 0.0–0.2)

## 2023-07-04 LAB — COMPREHENSIVE METABOLIC PANEL
ALT: 15 U/L (ref 0–44)
AST: 14 U/L — ABNORMAL LOW (ref 15–41)
Albumin: 3.7 g/dL (ref 3.5–5.0)
Alkaline Phosphatase: 97 U/L (ref 38–126)
Anion gap: 11 (ref 5–15)
BUN: 24 mg/dL — ABNORMAL HIGH (ref 8–23)
CO2: 23 mmol/L (ref 22–32)
Calcium: 9.5 mg/dL (ref 8.9–10.3)
Chloride: 107 mmol/L (ref 98–111)
Creatinine, Ser: 1.26 mg/dL — ABNORMAL HIGH (ref 0.61–1.24)
GFR, Estimated: >60 mL/min (ref >60)
Glucose, Bld: 128 mg/dL — ABNORMAL HIGH (ref 70–99)
Potassium: 3.9 mmol/L (ref 3.5–5.1)
Sodium: 141 mmol/L (ref 135–145)
Total Bilirubin: 0.8 mg/dL (ref 0.0–1.2)
Total Protein: 7.3 g/dL (ref 6.5–8.1)

## 2023-07-04 LAB — LACTIC ACID, PLASMA: Lactic Acid, Venous: 0.9 mmol/L (ref 0.5–1.9)

## 2023-07-04 LAB — GLUCOSE, CAPILLARY: Glucose-Capillary: 107 mg/dL — ABNORMAL HIGH (ref 70–99)

## 2023-07-04 MED ORDER — SENNOSIDES-DOCUSATE SODIUM 8.6-50 MG PO TABS: 1.0000 | ORAL_TABLET | Freq: Every evening | ORAL | Status: DC | PRN

## 2023-07-04 MED ORDER — HYDRALAZINE HCL 20 MG/ML IJ SOLN: 5.0000 mg | Freq: Four times a day (QID) | INTRAMUSCULAR | Status: AC | PRN

## 2023-07-04 MED ORDER — MORPHINE SULFATE (PF) 2 MG/ML IV SOLN: 4.0000 mg | INTRAVENOUS | Status: AC | PRN

## 2023-07-04 MED ORDER — VITAMIN C 500 MG PO TABS
1000.0000 mg | ORAL_TABLET | Freq: Every day | ORAL | Status: DC
  Administered 2023-07-05 – 2023-07-22 (×18): 1000 mg via ORAL
  Filled 2023-07-04 (×18): qty 2

## 2023-07-04 MED ORDER — ACETAMINOPHEN 325 MG PO TABS
650.0000 mg | ORAL_TABLET | Freq: Four times a day (QID) | ORAL | Status: AC | PRN
  Administered 2023-07-07 – 2023-07-08 (×3): 650 mg via ORAL
  Filled 2023-07-04 (×3): qty 2

## 2023-07-04 MED ORDER — ONDANSETRON HCL 4 MG/2ML IJ SOLN: 4.0000 mg | Freq: Four times a day (QID) | INTRAMUSCULAR | Status: AC | PRN

## 2023-07-04 MED ORDER — INSULIN ASPART 100 UNIT/ML IJ SOLN
0.0000 [IU] | Freq: Three times a day (TID) | INTRAMUSCULAR | Status: DC
  Administered 2023-07-07 – 2023-07-10 (×2): 1 [IU] via SUBCUTANEOUS
  Administered 2023-07-12: 2 [IU] via SUBCUTANEOUS
  Administered 2023-07-12: 1 [IU] via SUBCUTANEOUS
  Administered 2023-07-12: 2 [IU] via SUBCUTANEOUS
  Administered 2023-07-16 – 2023-07-17 (×4): 1 [IU] via SUBCUTANEOUS
  Filled 2023-07-04 (×10): qty 1

## 2023-07-04 MED ORDER — ONDANSETRON HCL 4 MG PO TABS: 4.0000 mg | ORAL_TABLET | Freq: Four times a day (QID) | ORAL | Status: AC | PRN

## 2023-07-04 MED ORDER — INSULIN ASPART 100 UNIT/ML IJ SOLN
0.0000 [IU] | Freq: Every day | INTRAMUSCULAR | Status: DC
  Filled 2023-07-04: qty 1

## 2023-07-04 MED ORDER — VANCOMYCIN HCL 1250 MG/250ML IV SOLN
1250.0000 mg | INTRAVENOUS | Status: DC
  Filled 2023-07-04: qty 250

## 2023-07-04 MED ORDER — MORPHINE SULFATE (PF) 2 MG/ML IV SOLN
2.0000 mg | INTRAVENOUS | Status: AC | PRN
  Administered 2023-07-04 – 2023-07-05 (×3): 2 mg via INTRAVENOUS
  Filled 2023-07-04 (×3): qty 1

## 2023-07-04 MED ORDER — SPIRONOLACTONE 12.5 MG HALF TABLET: 12.5000 mg | ORAL_TABLET | Freq: Every day | ORAL | Status: DC

## 2023-07-04 MED ORDER — SPIRONOLACTONE 12.5 MG HALF TABLET
12.5000 mg | ORAL_TABLET | Freq: Two times a day (BID) | ORAL | Status: DC
  Filled 2023-07-04: qty 1

## 2023-07-04 MED ORDER — HEPARIN SODIUM (PORCINE) 5000 UNIT/ML IJ SOLN
5000.0000 [IU] | Freq: Three times a day (TID) | INTRAMUSCULAR | Status: AC
  Administered 2023-07-04: 5000 [IU] via SUBCUTANEOUS
  Filled 2023-07-04: qty 1

## 2023-07-04 MED ORDER — CARVEDILOL 3.125 MG PO TABS
3.1250 mg | ORAL_TABLET | Freq: Two times a day (BID) | ORAL | Status: DC
  Administered 2023-07-05 – 2023-07-20 (×24): 3.125 mg via ORAL
  Filled 2023-07-04 (×30): qty 1

## 2023-07-04 MED ORDER — SODIUM CHLORIDE 0.9 % IV SOLN: INTRAVENOUS | Status: AC | PRN

## 2023-07-04 MED ORDER — VANCOMYCIN HCL IN DEXTROSE 1-5 GM/200ML-% IV SOLN
1000.0000 mg | Freq: Once | INTRAVENOUS | Status: AC
  Administered 2023-07-04: 1000 mg via INTRAVENOUS
  Filled 2023-07-04: qty 200

## 2023-07-04 MED ORDER — VANCOMYCIN HCL 500 MG/100ML IV SOLN
500.0000 mg | Freq: Once | INTRAVENOUS | Status: AC
  Administered 2023-07-04: 500 mg via INTRAVENOUS
  Filled 2023-07-04 (×2): qty 100

## 2023-07-04 MED ORDER — PIPERACILLIN-TAZOBACTAM 3.375 G IVPB 30 MIN
3.3750 g | Freq: Once | INTRAVENOUS | Status: AC
  Administered 2023-07-04: 3.375 g via INTRAVENOUS
  Filled 2023-07-04 (×2): qty 50

## 2023-07-04 MED ORDER — ACETAMINOPHEN 650 MG RE SUPP: 650.0000 mg | Freq: Four times a day (QID) | RECTAL | Status: AC | PRN

## 2023-07-04 MED ORDER — SODIUM CHLORIDE 0.9 % IV SOLN
2.0000 g | Freq: Two times a day (BID) | INTRAVENOUS | Status: DC
  Administered 2023-07-04 – 2023-07-05 (×2): 2 g via INTRAVENOUS
  Filled 2023-07-04 (×3): qty 12.5

## 2023-07-04 NOTE — Assessment & Plan Note (Signed)
-  Continue outpatient follow-up with hematology/oncology

## 2023-07-04 NOTE — Assessment & Plan Note (Addendum)
 Home carvedilol 3.125 mg p.o. twice daily with meals resumed on admission Hydralazine 5 mg IV every 6 hours as needed for SBP greater than 165, 5 days ordered

## 2023-07-04 NOTE — H&P (Signed)
 History and Physical   [name] [name] WUJ:[id number] DOB: Jan 08, 1956 DOA: 07/04/2023  PCP: Lorn Junes, FNP (Inactive)  Patient coming from: home via POV  I have personally briefly reviewed patient's old medical records in [facility] EMR.  Chief Concern: black toes, left  HPI: [name] [name], [name] is a 68 year old male with history of non-insulin-dependent diabetes mellitus, has since stopped taking metformin, hypertension, hyperlipidemia, who presents emergency department for chief concerns of black toes on the left foot.  Vitals in the ED showed temperature of 99, respiration rate of 15, heart rate 70, blood pressure 129/80, SpO2 97% on room air.  Serum sodium is 141, potassium 3.9, chloride 107, bicarb 23, BUN of 24, serum creatinine of 1.26, EGFR greater than 60, nonfasting blood glucose 128, WBC 10.6, hemoglobin 10.8, platelets of 334.  Lactic acid is 0.9.  ED treatment: Zosyn, vancomycin. ------------------------------- At bedside, patient able to tell me his first and last name, age, location, current calendar year.  He reports that he has noticed his toes bothering him and hurting with ambulation for about 1.5 months.  He denies fever, chills, nausea, vomiting, dysuria, hematuria, diarrhea.  He denies trauma to his toes.  He reports no pain at this time.  He reports there was pain with ambulation or when he puts weight on his left foot.  Social history: At home with his wife.  He denies tobacco, EtOH, recreational drug use.  ROS: Constitutional: no weight change, no fever ENT/Mouth: no sore throat, no rhinorrhea Eyes: no eye pain, no vision changes Cardiovascular: no chest pain, no dyspnea,  no edema, no palpitations Respiratory: no cough, no sputum, no wheezing Gastrointestinal: no nausea, no vomiting, no diarrhea, no constipation Genitourinary: no urinary incontinence, no dysuria, no hematuria Musculoskeletal: no arthralgias, no myalgias Skin: + skin  lesions, no pruritus, Neuro: no weakness, no loss of consciousness, no syncope Psych: no anxiety, no depression, no decrease appetite Heme/Lymph: no bruising, no bleeding  ED Course: Discussed with EDP, patient requiring hospitalization for chief concerns of left foot osteomyelitis.  Assessment/Plan  Principal Problem:   Foot osteomyelitis, left (HCC) Active Problems:   Diabetes mellitus (HCC)   Cardiomyopathy (HCC)   CAD (coronary artery disease)   HTN (hypertension)   History of rectal cancer   HLD (hyperlipidemia)   Atherosclerotic peripheral vascular disease with intermittent claudication (HCC)   Iron deficiency anemia   H/O ileostomy   Heart failure with reduced ejection fraction (HCC)   Assessment and Plan:  * Foot osteomyelitis, left (HCC) Blood cultures x 2 are in process per EDP Continue with vancomycin and cefepime ABI ordered on admission Vascular specialist has been consulted  Cardiomyopathy [facility]) Patient had complete echo on 05/08/2022: With estimated ejection fraction < 20% Does not appear to be in acute exacerbation at this time Home carvedilol 3.125 mg p.o. twice daily with meals, spironolactone 12.5 mg daily were resumed on admission Strict I's and O's  Diabetes mellitus (HCC) Patient has stopped taking metformin per outpatient provider Insulin SSI with at bedtime coverage ordered  History of rectal cancer Continue outpatient follow-up with hematology/oncology  HTN (hypertension) Home carvedilol 3.125 mg p.o. twice daily with meals resumed on admission Hydralazine 5 mg IV every 6 hours as needed for SBP greater than 165, 5 days ordered  Chart reviewed.   DVT prophylaxis: Heparin 5000 units subcutaneous one-time dose ordered on admission Code Status: Full code Diet: Heart healthy/carb modified Family Communication: Updated spouse at bedside with patient's permission Disposition Plan:  Pending clinical course Consults called: Vascular, podiatry via  EDP Admission status: Inpatient, telemetry medical  Past Medical History:  Diagnosis Date   Atherosclerosis    Cancer (HCC)    CHF (congestive heart failure) (HCC)    Chronic kidney disease    kidney stones, UTI   Colon cancer (HCC)    Coronary artery disease    Diabetes mellitus without complication (HCC)    GERD (gastroesophageal reflux disease)    Gout    Hematuria    Hyperlipidemia    Hypertension    Iron deficiency anemia    Myocardial infarction [facility]) 2005   Peripheral vascular disease (HCC)    Pulmonary nodules    Rectal cancer (HCC)    Ureter, stricture    Wears dentures    full upper   Past Surgical History:  Procedure Laterality Date   BOWEL RESECTION N/A 10/23/2014   Procedure: LOW ANTERIOR BOWEL RESECTION;  Surgeon: Natale Lay, MD;  Location: [facility] ORS;  Service: General;  Laterality: N/A;   COLON SURGERY     COLONOSCOPY 06/06/14     COLONOSCOPY WITH ESOPHAGOGASTRODUODENOSCOPY (EGD)     CORONARY ANGIOPLASTY WITH STENT PLACEMENT  03/2004   CYSTOSCOPY WITH STENT PLACEMENT Bilateral 10/23/2014   Procedure: CYSTOSCOPY WITH STENT PLACEMENT,URETHRAL DILATION, LEFT RETROGRADE PYELOGRAM, URETEROSCOPY;  Surgeon: Vanna Scotland, MD;  Location: [facility] ORS;  Service: Urology;  Laterality: Bilateral;   DIVERTING ILEOSTOMY N/A 10/23/2014   Procedure: DIVERTING ILEOSTOMY;  Surgeon: Natale Lay, MD;  Location: [facility] ORS;  Service: General;  Laterality: N/A;   ILEOSTOMY CLOSURE N/A 09/08/2015   Procedure: ILEOSTOMY TAKEDOWN;  Surgeon: Ricarda Frame, MD;  Location: [facility] ORS;  Service: General;  Laterality: N/A;   LAPAROTOMY N/A 10/23/2014   Procedure: EXPLORATORY LAPAROTOMY;  Surgeon: Natale Lay, MD;  Location: [facility] ORS;  Service: General;  Laterality: N/A;   PERIPHERAL VASCULAR CATHETERIZATION N/A 05/12/2015   Procedure: Abdominal Aortogram w/Lower Extremity;  Surgeon: Annice Needy, MD;  Location: [facility] INVASIVE CV LAB;  Service: Cardiovascular;  Laterality: N/A;   PERIPHERAL VASCULAR  CATHETERIZATION  05/12/2015   Procedure: Lower Extremity Intervention;  Surgeon: Annice Needy, MD;  Location: [facility] INVASIVE CV LAB;  Service: Cardiovascular;;   PERIPHERAL VASCULAR CATHETERIZATION N/A 06/30/2015   Procedure: Abdominal Aortogram w/Lower Extremity;  Surgeon: Annice Needy, MD;  Location: [facility] IN[facility];  Service: Cardiovascular;  Laterality: N/A;   PERIPHERAL VASCULAR CATHETERIZATION  06/30/2015   Procedure: Lower Extremity Intervention;  Surgeon: Annice Needy, MD;  Location: [facility] INV[facility] CV LAB;  Service: Cardiovascular;;   PORT-A-CATH REMOVAL Right 09/08/2015   Procedure: REMOVAL PORT-A-CATH;  Surgeon: Ricarda Frame, MD;  Location: [facility] ORS;  Service: General;  Laterality: Right;   PORTACATH PLACEMENT  07/01/2014   Dr. Egbert Garibaldi   Social History:  reports that he quit smoking about 38 years ago. His smoking use included cigarettes. He has never used smokeless tobacco. He reports that he does not drink alcohol and does not use drugs.  Allergies  Allergen Reactions   Entresto [Sacubitril-Valsartan] Cough   Lisinopril Cough   Family History  Problem Relation Age of Onset   Hypertension Brother    Hypertension Father    Diabetes Father    Diabetes Brother    Hypertension Brother    Heart disease Paternal Grandfather    Family history: Family history reviewed and not pertinent.  Prior to Admission medications   Medication Sig Start Date End Date Taking? Authorizing Provider  acetaminophen (TYLENOL) 500 MG tablet Take 500  mg by mouth every 6 (six) hours as needed.    [provider]  Ascorbic Acid (VITAMIN C) 1000 MG tablet Take 1,000 mg by mouth daily. Reported on 09/26/2015    [provider]  carvedilol (COREG) 3.125 MG tablet Take 3.125 mg by mouth 2 (two) times daily with a meal.    [provider]  colchicine 0.6 MG tablet Take 0.6 mg by mouth daily.    [provider]  Ferrous Sulfate (IRON) 325 (65 Fe) MG TABS Take 1 tablet by mouth  every other day. Patient not taking: Reported on 05/18/2022 01/22/22   [provider]  furosemide (LASIX) 40 MG tablet Take 1 tablet (40 mg total) by mouth daily. Patient taking differently: Take 20 mg by mouth daily. 05/14/22   Baron Hamper, MD  losartan (COZAAR) 25 MG tablet Take 25 mg by mouth daily. On hold since hospitalization Patient not taking: Reported on 05/18/2022    [provider]  pravastatin (PRAVACHOL) 20 MG tablet Take 20 mg by mouth daily. Patient not taking: Reported on 05/18/2022    [provider]  spironolactone (ALDACTONE) 25 MG tablet Take 0.5 tablets (12.5 mg total) by mouth daily. Patient taking differently: Take 25 mg by mouth daily. 05/13/22   Baron Hamper, MD   Physical Exam: Vitals:   07/04/23 1308 07/04/23 1313 07/04/23 1918  BP: 129/80  (!) 154/95  Pulse: 70  64  Resp: 15  16  Temp: 99 F (37.2 C)  97.6 F (36.4 C)  TempSrc: Oral  Axillary  SpO2: 97%  99%  Weight:  68 kg   Height:  5\' 8"  (1.727 m)    Constitutional: appears age-appropriate, weak,calm Eyes: PERRL, lids and conjunctivae normal ENMT: Mucous membranes are moist. Posterior pharynx clear of any exudate or lesions. Age-appropriate dentition. Hearing appropriate Neck: normal, supple, no masses, no thyromegaly Respiratory: clear to auscultation bilaterally, no wheezing, no crackles. Normal respiratory effort. No accessory muscle use.  Cardiovascular: Regular rate and rhythm, no murmurs / rubs / gallops. No extremity edema.  Decreased pedal pulses early, left greater than right. No carotid bruits.  Abdomen: no tenderness, no masses palpated, no hepatosplenomegaly. Bowel sounds positive.  Musculoskeletal: no clubbing / cyanosis. No joint deformity upper and lower extremities. Good ROM, no contractures, no atrophy. Normal muscle tone.  Skin: + Necrotic left toes    Neurologic: Sensation intact. Strength 5/5 in all 4.  Psychiatric: Normal judgment and insight. Alert and  oriented x 3. Normal mood.   EKG: Not indicated at this time  x-ray on Admission: I personally reviewed and I agree with radiologist reading as below.  DG Foot Complete Left Result Date: 07/04/2023 CLINICAL DATA:  Necrotic tissue with toes. EXAM: LEFT FOOT - COMPLETE 3+ VIEW COMPARISON:  None Available. FINDINGS: There is near complete erosion of the head of the second metatarsal. There is erosion the head of the third metatarsal along the medial border. Erosion of bone at the base of the second and third proximal phalanges. Bite likely erosions in the head of the first metatarsal. IMPRESSION: Clear osteomyelitis at the heads of the second and third metatarsals and adjacent proximal phalanges. Probable osteomyelitis head of the first metatarsal versus aggressive arthropathy Electronically Signed   By: Genevive Bi M.D.   On: 07/04/2023 15:40   Labs on Admission: I have personally reviewed following labs  CBC: Recent Labs  Lab 07/04/23 1318  WBC 10.6*  NEUTROABS 8.7*  HGB 10.8*  HCT 35.0*  MCV  82.4  PLT 335   Basic Metabolic Panel: Recent Labs  Lab 07/04/23 1318  NA 141  K 3.9  CL 107  CO2 23  GLUCOSE 128*  BUN 24*  CREATININE 1.26*  CALCIUM 9.5   GFR: Estimated Creatinine Clearance: 53.2 mL/min (A) (by C-G formula based on SCr of 1.26 mg/dL (H)).  Liver Function Tests: Recent Labs  Lab 07/04/23 1318  AST 14*  ALT 15  ALKPHOS 97  BILITOT 0.8  PROT 7.3  ALBUMIN 3.7   Urine analysis:    Component Value Date/Time   COLORURINE STRAW (A) 05/05/2022 2051   APPEARANCEUR CLEAR (A) 05/05/2022 2051   APPEARANCEUR Cloudy (A) 11/20/2014 0913   LABSPEC 1.006 05/05/2022 2051   LABSPEC 1.013 02/01/2014 1416   PHURINE 5.0 05/05/2022 2051   GLUCOSEU NEGATIVE 05/05/2022 2051   GLUCOSEU Negative 02/01/2014 1416   HGBUR NEGATIVE 05/05/2022 2051   BILIRUBINUR NEGATIVE 05/05/2022 2051   BILIRUBINUR Negative 11/20/2014 0913   BILIRUBINUR Negative 02/01/2014 1416    KETONESUR NEGATIVE 05/05/2022 2051   PROTEINUR 30 (A) 05/05/2022 2051   NITRITE NEGATIVE 05/05/2022 2051   LEUKOCYTESUR NEGATIVE 05/05/2022 2051   LEUKOCYTESUR Trace 02/01/2014 1416   This document was prepared using Dragon Voice Recognition software and may include unintentional dictation errors.  Dr. Sedalia Muta [facility]  If 7PM-7AM, please contact overnight-coverage provider If 7AM-7PM, please contact day attending provider [email or web address]  07/04/2023, 9:04 PM

## 2023-07-04 NOTE — Assessment & Plan Note (Addendum)
 Patient has stopped taking metformin per outpatient provider Insulin SSI with at bedtime coverage ordered

## 2023-07-04 NOTE — Hospital Course (Signed)
 Ms. Kemp Gomes, [name] is a 68 year old male with history of non-insulin-dependent diabetes mellitus, has since stopped taking metformin, hypertension, hyperlipidemia, who presents emergency department for chief concerns of black toes on the left foot.  Vitals in the ED showed temperature of 99, respiration rate of 15, heart rate 70, blood pressure 129/80, SpO2 97% on room air.  Serum sodium is 141, potassium 3.9, chloride 107, bicarb 23, BUN of 24, serum creatinine of 1.26, EGFR greater than 60, nonfasting blood glucose 128, WBC 10.6, hemoglobin 10.8, platelets of 334.  Lactic acid is 0.9.  ED treatment: Zosyn, vancomycin.

## 2023-07-04 NOTE — ED Triage Notes (Signed)
 Pt in via POV, states, "My toes are black."  States this has been a progressive issue which started a couple of months ago w/ an ingrown toe nail.  Third left toe is completely necrotic w/ surrounding toes on each side w/ necrotic tissue as well.  Foul odor noted, no significant drainage at this time.  Patient A/Ox4, vitals WDL.

## 2023-07-04 NOTE — Assessment & Plan Note (Signed)
 Blood cultures x 2 are in process per EDP Continue with vancomycin and cefepime ABI ordered on admission Vascular specialist has been consulted

## 2023-07-04 NOTE — ED Provider Notes (Signed)
 [facility] Provider Note    Event Date/Time   First MD Initiated Contact with Patient 07/04/23 1803     (approximate)   History   Wound Infection   HPI  [name] TAUZIN [name] is a 68 y.o. male with a history of systolic CHF, CAD, hypertension, CKD, and diabetes who presents with discoloration and pain to his left second third and fourth toes, gradual onset over the last 1 to 2 months.  The patient denies any acute pain today.  He states that a family member who had not seen him in some time saw the state of his foot and advised him to come in to get it checked out.  The patient reports history of diabetes and was on metformin previously but is no longer taking it.  He denies any acute trauma to the foot.  He has no fever or chills.  Denies any pain or redness going up the leg.  I reviewed the past medical records.  The patient was most recently admitted to the hospital service in January 2020 for with shortness of breath on exertion, edema, weight gain, consistent with CHF exacerbation.  His most recent documented outpatient encounter was with Dr. Juliann Pares from cardiology in January of this year.   Physical Exam   Triage Vital Signs: ED Triage Vitals  Encounter Vitals Group     BP 07/04/23 1308 129/80     Systolic BP Percentile --      Diastolic BP Percentile --      Pulse Rate 07/04/23 1308 70     Resp 07/04/23 1308 15     Temp 07/04/23 1308 99 F (37.2 C)     Temp Source 07/04/23 1308 Oral     SpO2 07/04/23 1308 97 %     Weight 07/04/23 1313 150 lb (68 kg)     Height 07/04/23 1313 5\' 8"  (1.727 m)     Head Circumference --      Peak Flow --      Pain Score 07/04/23 1313 0     Pain Loc --      Pain Education --      Exclude from Growth Chart --     Most recent vital signs: Vitals:   07/04/23 1308 07/04/23 1918  BP: 129/80 (!) 154/95  Pulse: 70 64  Resp: 15 16  Temp: 99 F (37.2 C) 97.6 F (36.4 C)  SpO2: 97% 99%     General: Awake,  no distress.  CV:  Good peripheral perfusion.  Resp:  Normal effort.  Abd:  No distention.  Other:  Left middle toe black in color, shrunken, with no sensation.  Second and fourth toes with similar black discoloration.  Normal color and warmth to the rest of the foot.  Diminished DP pulse.  No erythema, induration, or abnormal warmth going up the leg.   ED Results / Procedures / Treatments   Labs (all labs ordered are listed, but only abnormal results are displayed) Labs Reviewed  COMPREHENSIVE METABOLIC PANEL - Abnormal; Notable for the following components:      Result Value   Glucose, Bld 128 (*)    BUN 24 (*)    Creatinine, Ser 1.26 (*)    AST 14 (*)    All other components within normal limits  CBC WITH DIFFERENTIAL/PLATELET - Abnormal; Notable for the following components:   WBC 10.6 (*)    Hemoglobin 10.8 (*)    HCT 35.0 (*)  MCH 25.4 (*)    Neutro Abs 8.7 (*)    All other components within normal limits  CULTURE, BLOOD (ROUTINE X 2)  CULTURE, BLOOD (ROUTINE X 2)  LACTIC ACID, PLASMA  HIV ANTIBODY (ROUTINE TESTING W REFLEX)  BASIC METABOLIC PANEL  CBC     EKG     RADIOLOGY  XR L foot:  IMPRESSION:  Clear osteomyelitis at the heads of the second and third metatarsals  and adjacent proximal phalanges.    Probable osteomyelitis head of the first metatarsal versus  aggressive arthropathy    PROCEDURES:  Critical Care performed: No  Procedures   MEDICATIONS ORDERED IN ED: Medications  vancomycin (VANCOCIN) IVPB 1000 mg/200 mL premix (1,000 mg Intravenous New Bag/Given 07/04/23 1921)  acetaminophen (TYLENOL) tablet 650 mg (has no administration in time range)    Or  acetaminophen (TYLENOL) suppository 650 mg (has no administration in time range)  ondansetron (ZOFRAN) tablet 4 mg (has no administration in time range)    Or  ondansetron (ZOFRAN) injection 4 mg (has no administration in time range)  heparin injection 5,000 Units (has no  administration in time range)  senna-docusate (Senokot-S) tablet 1 tablet (has no administration in time range)  vancomycin (VANCOREADY) IVPB 500 mg/100 mL (has no administration in time range)  ceFEPIme (MAXIPIME) 2 g in sodium chloride 0.9 % 100 mL IVPB (has no administration in time range)  piperacillin-tazobactam (ZOSYN) IVPB 3.375 g (0 g Intravenous Stopped 07/04/23 1912)     IMPRESSION / MDM / ASSESSMENT AND PLAN / ED COURSE  I reviewed the triage vital signs and the nursing notes.  68 year old male with PMH as noted above presents with gradual onset of discoloration of his left second, third, and fourth toes.  On exam the 3 toes, and particularly the middle toe, appear gangrenous, with malodorous discharge.  Differential diagnosis includes, but is not limited to, gangrene, osteomyelitis, PAD, diabetes.  CBC shows mild leukocytosis.  CMP is unremarkable.  Lactate is negative.  I have ordered blood cultures.  X-ray shows findings consistent with osteomyelitis.  We will start the patient on vancomycin and Zosyn.  I will consult podiatry and vascular surgery and plan for admission.  Patient's presentation is most consistent with acute presentation with potential threat to life or bodily function.  The patient is on the cardiac monitor to evaluate for evidence of arrhythmia and/or significant heart rate changes.  ----------------------------------------- 7:34 PM on 07/04/2023 -----------------------------------------  I consulted and discussed the case with Dr. Excell Seltzer from podiatry as well as with Dr. Wyn Quaker from vascular surgery, both of whom agreed to evaluate the patient.  There is no indication for acute intervention tonight.  I consulted Dr. Sedalia Muta from the hospitalist service; based on our discussion she agrees to evaluate the patient for admission.    FINAL CLINICAL IMPRESSION(S) / ED DIAGNOSES   Final diagnoses:  Osteomyelitis of left foot, unspecified type (HCC)     Rx / DC  Orders   ED Discharge Orders     None        Note:  This document was prepared using Dragon voice recognition software and may include unintentional dictation errors.    Dionne Bucy, MD 07/04/23 2003

## 2023-07-04 NOTE — Progress Notes (Signed)
 Pharmacy Antibiotic Note  [name] [name] [name] is a 68 y.o. male admitted on 07/04/2023 with  osteomyelitis . Patient presenting with discoloration and malodorous discharge of his left second, third, and fourth toes. Imaging of the toes shows osteomyelitis of second and third metatarsals of left foot. PMH significant for CHF, CAD, hypertension, CKD, and diabetes. In ED, patient is afebrile with WBC 10.6. Pharmacy has been consulted for vancomycin and cefepime dosing.  Plan: Start cefepime 2 g IV every 12 hours Give vancomycin load of 1500 mg IV x1 Start vancomycin 1250 mg IV every 24 hours (eAUC 525.6, Scr 1.26, TBW used, Vd 0.72 L/kg) Monitor renal function, clinical status, culture data, and LOT  Height: 5\' 8"  (172.7 cm) Weight: 68 kg (150 lb) IBW/kg (Calculated) : 68.4  Temp (24hrs), Avg:98.3 F (36.8 C), Min:97.6 F (36.4 C), Max:99 F (37.2 C)  Recent Labs  Lab 07/04/23 1318  WBC 10.6*  CREATININE 1.26*  LATICACIDVEN 0.9    Estimated Creatinine Clearance: 53.2 mL/min (A) (by C-G formula based on SCr of 1.26 mg/dL (H)).    Allergies  Allergen Reactions   Entresto [Sacubitril-Valsartan] Cough   Lisinopril Cough    Antimicrobials this admission: Zosyn 3/17 x1 cefepime 3/17 >>  Vancomycin 3/17 >>  Dose adjustments this admission: N/A  Microbiology results: 3/17 BCx: pending  Thank you for involving pharmacy in this patient's care.   Rockwell Alexandria, PharmD Clinical Pharmacist 07/04/2023 8:03 PM

## 2023-07-04 NOTE — Assessment & Plan Note (Addendum)
 Patient had complete echo on 05/08/2022: With estimated ejection fraction < 20% Does not appear to be in acute exacerbation at this time Home carvedilol 3.125 mg p.o. twice daily with meals, spironolactone 12.5 mg daily were resumed on admission Strict I's and O's

## 2023-07-05 ENCOUNTER — Inpatient Hospital Stay

## 2023-07-05 ENCOUNTER — Inpatient Hospital Stay
Admit: 2023-07-05 | Discharge: 2023-07-05 | Disposition: A | Discharge status: Home / Self Care | Attending: Student | Admitting: Student

## 2023-07-05 ENCOUNTER — Encounter: Payer: Medicare HMO | Admitting: Family

## 2023-07-05 DIAGNOSIS — M869 Osteomyelitis, unspecified: Secondary | ICD-10-CM | POA: Diagnosis not present

## 2023-07-05 LAB — BASIC METABOLIC PANEL
Anion gap: 12 (ref 5–15)
BUN: 26 mg/dL — ABNORMAL HIGH (ref 8–23)
CO2: 23 mmol/L (ref 22–32)
Calcium: 9.3 mg/dL (ref 8.9–10.3)
Chloride: 104 mmol/L (ref 98–111)
Creatinine, Ser: 1.4 mg/dL — ABNORMAL HIGH (ref 0.61–1.24)
GFR, Estimated: 54 mL/min — ABNORMAL LOW (ref >60)
Glucose, Bld: 94 mg/dL (ref 70–99)
Potassium: 3.8 mmol/L (ref 3.5–5.1)
Sodium: 139 mmol/L (ref 135–145)

## 2023-07-05 LAB — CBC
HCT: 35.4 % — ABNORMAL LOW (ref 39.0–52.0)
Hemoglobin: 11.1 g/dL — ABNORMAL LOW (ref 13.0–17.0)
MCH: 25.5 pg — ABNORMAL LOW (ref 26.0–34.0)
MCHC: 31.4 g/dL (ref 30.0–36.0)
MCV: 81.2 fL (ref 80.0–100.0)
Platelets: 360 10*3/uL (ref 150–400)
RBC: 4.36 MIL/uL (ref 4.22–5.81)
RDW: 14.4 % (ref 11.5–15.5)
WBC: 8.5 10*3/uL (ref 4.0–10.5)
nRBC: 0 % (ref 0.0–0.2)

## 2023-07-05 LAB — GLUCOSE, CAPILLARY
Glucose-Capillary: 94 mg/dL (ref 70–99)
Glucose-Capillary: 93 mg/dL (ref 70–99)
Glucose-Capillary: 78 mg/dL (ref 70–99)
Glucose-Capillary: 116 mg/dL — ABNORMAL HIGH (ref 70–99)

## 2023-07-05 LAB — HEMOGLOBIN A1C
Hgb A1c MFr Bld: 6.3 % — ABNORMAL HIGH (ref 4.8–5.6)
Mean Plasma Glucose: 134.11 mg/dL

## 2023-07-05 LAB — SURGICAL PCR SCREEN
MRSA, PCR: NEGATIVE
Staphylococcus aureus: NEGATIVE

## 2023-07-05 LAB — C-REACTIVE PROTEIN: CRP: 12.5 mg/dL — ABNORMAL HIGH (ref <1.0)

## 2023-07-05 LAB — SEDIMENTATION RATE: Sed Rate: 81 mm/h — ABNORMAL HIGH (ref 0–20)

## 2023-07-05 MED ORDER — FAMOTIDINE 20 MG PO TABS: 40.0000 mg | ORAL_TABLET | Freq: Once | ORAL | Status: DC | PRN

## 2023-07-05 MED ORDER — FENTANYL CITRATE PF 50 MCG/ML IJ SOSY: 12.5000 ug | PREFILLED_SYRINGE | Freq: Once | INTRAMUSCULAR | Status: DC | PRN

## 2023-07-05 MED ORDER — DIPHENHYDRAMINE HCL 50 MG/ML IJ SOLN: 50.0000 mg | Freq: Once | INTRAMUSCULAR | Status: DC | PRN

## 2023-07-05 MED ORDER — SODIUM CHLORIDE 0.9 % IV SOLN
3.0000 g | Freq: Four times a day (QID) | INTRAVENOUS | Status: DC
  Administered 2023-07-05 – 2023-07-17 (×45): 3 g via INTRAVENOUS
  Filled 2023-07-05 (×49): qty 8

## 2023-07-05 MED ORDER — PERFLUTREN LIPID MICROSPHERE
1.0000 mL | INTRAVENOUS | Status: DC | PRN
  Administered 2023-07-05: 1 mL via INTRAVENOUS

## 2023-07-05 MED ORDER — CEFAZOLIN SODIUM-DEXTROSE 2-4 GM/100ML-% IV SOLN: 2.0000 g | INTRAVENOUS | Status: DC

## 2023-07-05 MED ORDER — HEPARIN SODIUM (PORCINE) 5000 UNIT/ML IJ SOLN
5000.0000 [IU] | Freq: Three times a day (TID) | INTRAMUSCULAR | Status: DC
  Administered 2023-07-05 – 2023-07-21 (×45): 5000 [IU] via SUBCUTANEOUS
  Filled 2023-07-05 (×45): qty 1

## 2023-07-05 MED ORDER — MIDAZOLAM HCL 2 MG/ML PO SYRP: 8.0000 mg | ORAL_SOLUTION | Freq: Once | ORAL | Status: DC | PRN

## 2023-07-05 MED ORDER — METHYLPREDNISOLONE SODIUM SUCC 125 MG IJ SOLR: 125.0000 mg | Freq: Once | INTRAMUSCULAR | Status: DC | PRN

## 2023-07-05 MED ORDER — SPIRONOLACTONE 12.5 MG HALF TABLET
12.5000 mg | ORAL_TABLET | Freq: Every day | ORAL | Status: DC
  Filled 2023-07-05: qty 1

## 2023-07-05 MED ORDER — SODIUM CHLORIDE 0.9 % IV SOLN: INTRAVENOUS | Status: DC

## 2023-07-05 MED ORDER — LINEZOLID 600 MG/300ML IV SOLN
600.0000 mg | Freq: Two times a day (BID) | INTRAVENOUS | Status: DC
  Administered 2023-07-05 – 2023-07-08 (×5): 600 mg via INTRAVENOUS
  Filled 2023-07-05 (×6): qty 300

## 2023-07-05 NOTE — Plan of Care (Signed)
  Problem: Education: Goal: Ability to describe self-care measures that may prevent or decrease complications (Diabetes Survival Skills Education) will improve Outcome: Progressing Goal: Individualized Educational Video(s) Outcome: Progressing   Problem: Coping: Goal: Ability to adjust to condition or change in health will improve Outcome: Progressing   Problem: Fluid Volume: Goal: Ability to maintain a balanced intake and output will improve Outcome: Progressing   Problem: Health Behavior/Discharge Planning: Goal: Ability to identify and utilize available resources and services will improve Outcome: Progressing Goal: Ability to manage health-related needs will improve Outcome: Progressing   Problem: Metabolic: Goal: Ability to maintain appropriate glucose levels will improve Outcome: Progressing   Problem: Nutritional: Goal: Maintenance of adequate nutrition will improve Outcome: Progressing Goal: Progress toward achieving an optimal weight will improve Outcome: Progressing   Problem: Skin Integrity: Goal: Risk for impaired skin integrity will decrease Outcome: Progressing   Problem: Metabolic: Goal: Ability to maintain appropriate glucose levels will improve Outcome: Progressing   Problem: Nutritional: Goal: Maintenance of adequate nutrition will improve Outcome: Progressing Goal: Progress toward achieving an optimal weight will improve Outcome: Progressing   Problem: Skin Integrity: Goal: Risk for impaired skin integrity will decrease Outcome: Progressing   Problem: Tissue Perfusion: Goal: Adequacy of tissue perfusion will improve Outcome: Progressing

## 2023-07-05 NOTE — Progress Notes (Signed)
 [facility] Progress Note  Patient: [name] [name]    GEX:[id number]  DOA: 07/04/2023     Date of Service: the patient was seen and examined on 07/05/2023  Chief Complaint  Patient presents with   Wound Infection   Brief hospital course: Ms. Tex Conroy, [name] is a 68 year old male with history of non-insulin-dependent diabetes mellitus, has since stopped taking metformin, hypertension, hyperlipidemia, who presents emergency department for chief concerns of black toes on the left foot. He reports that he has noticed his toes bothering him and hurting with ambulation for about 1.5 months.  Denies any pain at rest, it hurts only when he is ambulating or putting weight on his left foot.  Vitals in the ED showed temperature of 99, respiration rate of 15, heart rate 70, blood pressure 129/80, SpO2 97% on room air.   Serum sodium is 141, potassium 3.9, chloride 107, bicarb 23, BUN of 24, serum creatinine of 1.26, EGFR greater than 60, nonfasting blood glucose 128, WBC 10.6, hemoglobin 10.8, platelets of 334.   Lactic acid is 0.9.   ED treatment: Zosyn, vancomycin.   Assessment and Plan:  # Foot osteomyelitis, left  Blood cultures x 2 are in process per EDP S/p vancomycin and cefepime, changed antibiotics to Unasyn and Zyvox Pharmacy following for antibiotics ABI: 1. No detectable flow in the left posterior tibial or dorsalis pedis arteries distally. ABI therefore cannot be calculated. 2. Moderately depressed resting ankle-brachial index of 0.53 on the right. Monophasic right posterior tibial artery waveform. No flow detectable in the right dorsalis pedis artery. Vascular surgery consulted, plan for angio tomorrow a.m. Keep n.p.o. after midnight Podiatry consulted.  Cardiomyopathy Chronic systolic CHF, compensated. Patient had complete echo on 05/08/2022: With estimated ejection fraction < 20% Does not appear to be in acute exacerbation at this time Home carvedilol 3.125 mg  p.o. twice daily with meals,  Held Lasix and spironolactone home meds due to elevated Cr Strict I's and O's Monitor renal functions and urine output Follow repeat 2D echocardiogram Follow chest x-ray to rule out pleural effusion due to CHF  Diabetes mellitus (HCC) HbA1c 6.3, well-controlled Patient has stopped taking metformin per outpatient provider Continue NovoLog sliding scale Monitor CBG, continue diabetic diet  History of rectal cancer Continue outpatient follow-up with hematology/oncology   HTN (hypertension) Home carvedilol 3.125 mg p.o. twice daily with meals resumed on admission Use hydralazine as needed Monitor BP and titrate medications accordingly    Body mass index is 23.64 kg/m.  Interventions:  Diet: Heart healthy diet DVT Prophylaxis: Subcutaneous Heparin    Advance goals of care discussion: Full code  Family Communication: family was not present at bedside, at the time of interview.  The pt provided permission to discuss medical plan with the family. Opportunity was given to ask question and all questions were answered satisfactorily.   Disposition:  Pt is from Home, admitted with left toe osteomyelitis, on IV antibiotics, angiogram scheduled on 3/19, will need surgical intervention by podiatry, which precludes a safe discharge. Discharge to home, when stable and cleared by vascular surgery and podiatry.  Subjective: No significant events overnight, patient was resting comfortably, pain 6/10, feeling better after pain medications.  Denied any chest pain or palpitations, no shortness of breath.  Physical Exam: General: NAD, lying comfortably Appear in no distress, affect appropriate Eyes: PERRLA ENT: Oral Mucosa Clear, moist  Neck: no JVD,  Cardiovascular: S1 and S2 Present, no Murmur,  Respiratory: good respiratory effort, Bilateral Air entry equal  and Decreased, no Crackles, no wheezes Abdomen: Bowel Sound present, Soft and no tenderness,  Skin: no  rashes Extremities: no Pedal edema, no calf tenderness, left foot toe erythematous, tender and foul-smelling, blackish discoloration noticed, Neurologic: without any new focal findings Gait not checked due to patient safety concerns  Vitals:   07/05/23 0416 07/05/23 0750 07/05/23 0805 07/05/23 1553  BP: 133/78 111/69 111/69 110/70  Pulse: 76 78 78 63  Resp: 16 16  16   Temp: 99.2 F (37.3 C) 99 F (37.2 C)  98.5 F (36.9 C)  TempSrc: Oral   Oral  SpO2: 95% 98%  99%  Weight:      Height:        Intake/Output Summary (Last 24 hours) at 07/05/2023 1617 Last data filed at 07/05/2023 1300 Gross per 24 hour  Intake 340 ml  Output 300 ml  Net 40 ml   Filed Weights   07/04/23 1313 07/04/23 2236  Weight: 68 kg 70.5 kg    Data Reviewed: I have personally reviewed and interpreted daily labs, tele strips, imagings as discussed above. I reviewed all nursing notes, pharmacy notes, vitals, pertinent old records I have discussed plan of care as described above with RN and patient/family.  CBC: Recent Labs  Lab 07/04/23 1318 07/05/23 0434  WBC 10.6* 8.5  NEUTROABS 8.7*  --   HGB 10.8* 11.1*  HCT 35.0* 35.4*  MCV 82.4 81.2  PLT 335 360   Basic Metabolic Panel: Recent Labs  Lab 07/04/23 1318 07/05/23 0434  NA 141 139  K 3.9 3.8  CL 107 104  CO2 23 23  GLUCOSE 128* 94  BUN 24* 26*  CREATININE 1.26* 1.40*  CALCIUM 9.5 9.3    Studies: US ARTERIAL ABI (SCREENING LOWER EXTREMITY) Result Date: 07/05/2023 CLINICAL DATA:  Per vascular disease, diabetes and gangrene with osteomyelitis of the left foot. EXAM: NONINVASIVE PHYSIOLOGIC VASCULAR STUDY OF BILATERAL LOWER EXTREMITIES TECHNIQUE: Evaluation of both lower extremities were performed at rest, including calculation of ankle-brachial indices with single level Doppler, pressure and pulse volume recording. COMPARISON:  None Available. FINDINGS: Right ABI:  0.53 Left ABI:  No detectable flow to calculate ABI. Right Lower Extremity:  Monophasic posterior tibial waveform. No detectable flow in the dorsalis pedis artery. Left Lower Extremity: No detectable flow in either the posterior tibial or dorsalis pedis artery distally. 0.5-0.79 Moderate PAD IMPRESSION: 1. No detectable flow in the left posterior tibial or dorsalis pedis arteries distally. ABI therefore cannot be calculated. 2. Moderately depressed resting ankle-brachial index of 0.53 on the right. Monophasic right posterior tibial artery waveform. No flow detectable in the right dorsalis pedis artery. Electronically Signed   By: Irish Lack M.D.   On: 07/05/2023 12:27    Scheduled Meds:  vitamin C  1,000 mg Oral Daily   carvedilol  3.125 mg Oral BID WC   insulin aspart  0-5 Units Subcutaneous QHS   insulin aspart  0-9 Units Subcutaneous TID WC   Continuous Infusions:  sodium chloride 10 mL/hr at 07/04/23 2304   sodium chloride     ampicillin-sulbactam (UNASYN) IV      ceFAZolin (ANCEF) IV     linezolid (ZYVOX) IV     PRN Meds: sodium chloride, acetaminophen **OR** acetaminophen, diphenhydrAMINE, famotidine, fentaNYL (SUBLIMAZE) injection, hydrALAZINE, methylPREDNISolone (SOLU-MEDROL) injection, midazolam, morphine injection, morphine injection, ondansetron **OR** ondansetron (ZOFRAN) IV, senna-docusate  Time spent: 55 minutes  Author: Gillis Santa. MD [facility] 07/05/2023 4:17 PM  To reach On-call, see care teams to locate  the attending and reach out to them via [email or web address]. If 7PM-7AM, please contact night-coverage If you still have difficulty reaching the attending provider, please page the [facility] (Director on Call) for [facility] on amion for assistance.

## 2023-07-05 NOTE — H&P (View-Only) (Signed)
 Hospital Consult    Reason for Consult:  Left Lower Extremity Gangrene  Requesting Physician:  Dr Londell Moh MD  MRN #:  [id number]  History of Present Illness: This is a 68 y.o. male with history of non-insulin-dependent diabetes mellitus, has since stopped taking metformin, hypertension, hyperlipidemia, who presents emergency department for chief concerns of black toes on the left foot.   On exam this morning the patient is resting comfortably in bed.  Left lower extremity has multiple toes that are necrotic and gangrene.  He does endorse some pain to his left lower extremity.  He endorses the pain is more likely to be sharp when he is while up and walking around.  Patient states his toes have gotten worse in the last 2 to 3 weeks.  Vascular surgery consulted to evaluate.  Past Medical History:  Diagnosis Date   Atherosclerosis    Cancer (HCC)    CHF (congestive heart failure) (HCC)    Chronic kidney disease    kidney stones, UTI   Colon cancer (HCC)    Coronary artery disease    Diabetes mellitus without complication (HCC)    GERD (gastroesophageal reflux disease)    Gout    Hematuria    Hyperlipidemia    Hypertension    Iron deficiency anemia    Myocardial infarction [facility]) 2005   Peripheral vascular disease (HCC)    Pulmonary nodules    Rectal cancer (HCC)    Ureter, stricture    Wears dentures    full upper    Past Surgical History:  Procedure Laterality Date   BOWEL RESECTION N/A 10/23/2014   Procedure: LOW ANTERIOR BOWEL RESECTION;  Surgeon: Natale Lay, MD;  Location: [facility] OR[facility];  Service: General;  Laterality: N/A;   COLON SURGERY     COLONOSCOPY 06/06/14     COLONOSCOPY WITH ESOPHAGOGASTRODUODENOSCOPY (EGD)     CORONARY ANGIOPLASTY WITH STENT PLACEMENT  03/2004   CYSTOSCOPY WITH STENT PLACEMENT Bilateral 10/23/2014   Procedure: CYSTOSCOPY WITH STENT PLACEMENT,URETHRAL DILATION, LEFT RETROGRADE PYELOGRAM, URETEROSCOPY;  Surgeon: Vanna Scotland, MD;  Location: [facility];   Service: Urology;  Laterality: Bilateral;   DIVERTING ILEOSTOMY N/A 10/23/2014   Procedure: DIVERTING ILEOSTOMY;  Surgeon: Natale Lay, MD;  Location: [facility];  Service: General;  Laterality: N/A;   ILEOSTOMY CLOSURE N/A 09/08/2015   Procedure: ILEOSTOMY TAKEDOWN;  Surgeon: Ricarda Frame, MD;  Location: [facility];  Service: General;  Laterality: N/A;   LAPAROTOMY N/A 10/23/2014   Procedure: EXPLORATORY LAPAROTOMY;  Surgeon: Natale Lay, MD;  Location: [facility];  Service: General;  Laterality: N/A;   PERIPHERAL VASCULAR CATHETERIZATION N/A 05/12/2015   Procedure: Abdominal Aortogram w/Lower Extremity;  Surgeon: Annice Needy, MD;  Location: [facility];  Service: Cardiovascular;  Laterality: N/A;   PERIPHERAL VASCULAR CATHETERIZATION  05/12/2015   Procedure: Lower Extremity Intervention;  Surgeon: Annice Needy, MD;  Location: [facility];  Service: Cardiovascular;;   PERIPHERAL VASCULAR CATHETERIZATION N/A 06/30/2015   Procedure: Abdominal Aortogram w/Lower Extremity;  Surgeon: Annice Needy, MD;  Location: [facility];  Service: Cardiovascular;  Laterality: N/A;   PERIPHERAL VASCULAR CATHETERIZATION  06/30/2015   Procedure: Lower Extremity Intervention;  Surgeon: Annice Needy, MD;  Location: [facility];  Service: Cardiovascular;;   PORT-A-CATH REMOVAL Right 09/08/2015   Procedure: REMOVAL PORT-A-CATH;  Surgeon: Ricarda Frame, MD;  Location: [facility];  Service: General;  Laterality: Right;   PORTACATH PLACEMENT  07/01/2014   Dr. Egbert Garibaldi  Allergies  Allergen Reactions   Entresto [Sacubitril-Valsartan] Cough   Lisinopril Cough    Prior to Admission medications   Medication Sig Start Date End Date Taking? Authorizing Provider  acetaminophen (TYLENOL) 500 MG tablet Take 500 mg by mouth every 6 (six) hours as needed.   Yes [provider]  Ascorbic Acid (VITAMIN C) 1000 MG tablet Take 1,000 mg by mouth daily. Reported on 09/26/2015   Yes [provider]   carvedilol (COREG) 3.125 MG tablet Take 3.125 mg by mouth 2 (two) times daily with a meal.   Yes [provider]  colchicine 0.6 MG tablet Take 0.6 mg by mouth daily.   Yes [provider]  furosemide (LASIX) 40 MG tablet Take 1 tablet (40 mg total) by mouth daily. 05/14/22  Yes Baron Hamper, MD  spironolactone (ALDACTONE) 25 MG tablet Take 0.5 tablets (12.5 mg total) by mouth daily. Patient taking differently: Take 12.5 mg by mouth 2 (two) times daily. 05/13/22  Yes Baron Hamper, MD  cloNIDine (CATAPRES) 0.2 MG tablet Take 0.2 mg by mouth 2 (two) times daily. Patient not taking: Reported on 07/04/2023    [provider]  Ferrous Sulfate (IRON) 325 (65 Fe) MG TABS Take 1 tablet by mouth every other day. Patient not taking: Reported on 05/18/2022 01/22/22   [provider]  losartan (COZAAR) 25 MG tablet Take 25 mg by mouth daily. On hold since hospitalization Patient not taking: Reported on 05/18/2022    [provider]  pravastatin (PRAVACHOL) 20 MG tablet Take 20 mg by mouth daily. Patient not taking: Reported on 05/18/2022    [provider]    Social History   Socioeconomic History   Marital status: Married    Spouse name: Not on file   Number of children: Not on file   Years of education: Not on file   Highest education level: Not on file  Occupational History   Not on file  Tobacco Use   Smoking status: Former    Current packs/day: 0.00    Types: Cigarettes    Quit date: 11/19/1984    Years since quitting: 38.6   Smokeless tobacco: Never   Tobacco comments:    smoked for brief period in his teens  Vaping Use   Vaping status: Never Used  Substance and Sexual Activity   Alcohol use: No    Alcohol/week: 0.0 standard drinks of alcohol   Drug use: No   Sexual activity: Not Currently  Other Topics Concern   Not on file  Social History Narrative   Not on file   Social Drivers of Health   Financial Resource Strain: Not on  file  Food Insecurity: No Food Insecurity (07/04/2023)   Hunger Vital Sign    Worried About Running Out of Food in the Last Year: Never true    Ran Out of Food in the Last Year: Never true  Transportation Needs: No Transportation Needs (07/04/2023)   PRAPARE - Administrator, Civil Service (Medical): No    Lack of Transportation (Non-Medical): No  Physical Activity: Not on file  Stress: Not on file  Social Connections: Moderately Integrated (07/04/2023)   Social Connection and Isolation Panel [NHANES]    Frequency of Communication with Friends and Family: Three times a week    Frequency of Social Gatherings with Friends and Family: More than three times a week    Attends Religious Services: More than 4 times per year    Active Member of  Clubs or Organizations: No    Attends Banker Meetings: Never    Marital Status: Married  Catering manager Violence: Not At Risk (07/04/2023)   Humiliation, Afraid, Rape, and Kick questionnaire    Fear of Current or Ex-Partner: No    Emotionally Abused: No    Physically Abused: No    Sexually Abused: No     Family History  Problem Relation Age of Onset   Hypertension Brother    Hypertension Father    Diabetes Father    Diabetes Brother    Hypertension Brother    Heart disease Paternal Grandfather     ROS: Otherwise negative unless mentioned in HPI  Physical Examination  Vitals:   07/04/23 2236 07/05/23 0416  BP: (!) 137/91 133/78  Pulse: 73 76  Resp: 16 16  Temp: 97.8 F (36.6 C) 99.2 F (37.3 C)  SpO2: 100% 95%   Body mass index is 23.64 kg/m.  General:  WDWN in NAD Gait: Not observed HENT: WNL, normocephalic Pulmonary: normal non-labored breathing, without Rales, rhonchi,  wheezing Cardiac: regular, without  Murmurs, rubs or gallops; without carotid bruits Abdomen: Positive bowel sounds throughout, soft, NT/ND, no masses Skin: without rashes Vascular Exam/Pulses: Unable to palpate DP/PT pulses of left  lower extremity, foot is cool to touch.  Right lower extremity warm to touch with palpable DP PT pulses. Extremities: with ischemic changes, with Gangrene , without cellulitis; with open wounds;  Musculoskeletal: no muscle wasting or atrophy  Neurologic: A&O X 3;  No focal weakness or paresthesias are detected; speech is fluent/normal Psychiatric:  The pt has Normal affect. Lymph:  Unremarkable  CBC    Component Value Date/Time   WBC 8.5 07/05/2023 0434   RBC 4.36 07/05/2023 0434   HGB 11.1 (L) 07/05/2023 0434   HGB 11.7 (L) 08/12/2014 0858   HCT 35.4 (L) 07/05/2023 0434   HCT 37.1 (L) 08/12/2014 0858   PLT 360 07/05/2023 0434   PLT 282 08/12/2014 0858   MCV 81.2 07/05/2023 0434   MCV 72 (L) 08/12/2014 0858   MCH 25.5 (L) 07/05/2023 0434   MCHC 31.4 07/05/2023 0434   RDW 14.4 07/05/2023 0434   RDW 20.6 (H) 08/12/2014 0858   LYMPHSABS 0.8 07/04/2023 1318   LYMPHSABS 0.3 (L) 08/12/2014 0858   MONOABS 0.8 07/04/2023 1318   MONOABS 0.7 08/12/2014 0858   EOSABS 0.2 07/04/2023 1318   EOSABS 0.2 08/12/2014 0858   BASOSABS 0.0 07/04/2023 1318   BASOSABS 0.0 08/12/2014 0858    BMET    Component Value Date/Time   NA 139 07/05/2023 0434   NA 132 (L) 08/12/2014 0858   K 3.8 07/05/2023 0434   K 4.0 08/12/2014 0858   CL 104 07/05/2023 0434   CL 100 (L) 08/12/2014 0858   CO2 23 07/05/2023 0434   CO2 24 08/12/2014 0858   GLUCOSE 94 07/05/2023 0434   GLUCOSE 118 (H) 08/12/2014 0858   BUN 26 (H) 07/05/2023 0434   BUN 30 (H) 08/12/2014 0858   CREATININE 1.40 (H) 07/05/2023 0434   CREATININE 1.52 (H) 08/12/2014 0858   CALCIUM 9.3 07/05/2023 0434   CALCIUM 8.9 08/12/2014 0858   GFRNONAA 54 (L) 07/05/2023 0434   GFRNONAA 49 (L) 08/12/2014 0858   GFRAA >60 01/10/2020 0940   GFRAA 57 (L) 08/12/2014 0858    COAGS: Lab Results  Component Value Date   INR 1.00 08/28/2015     Non-Invasive Vascular Imaging:   Ultrasound arterial ABI of the left lower extremity  is ordered.  No  results at this time.  Statin:  Yes.   Beta Blocker:  Yes.   Aspirin:  No. ACEI:  No. ARB:  Yes.   CCB use:  No Other antiplatelets/anticoagulants:  No.    ASSESSMENT/PLAN: This is a 68 y.o. male who presents to [facility] emergency department with chief concerns of black toes on left foot.  Ultrasound with ABIs ordered for left lower extremity.  No results at this time.  Vascular surgery plans on taking the patient to the vascular lab on 07/06/2023 for left lower extremity angiogram with possible intervention.  I discussed at the bedside with the patient in detail today the procedure, benefits, risk, and complications.  Patient verbalizes understanding and wishes to proceed as soon as possible.  I answered all the patient's questions this morning.  Patient will be made n.p.o. after midnight the day of his procedure.   -I discussed the case in detail with Dr. [name] MD and he agrees with the plan.   Marcie Bal [facility] 07/05/2023 7:11 AM

## 2023-07-05 NOTE — Plan of Care (Signed)
 Education: Add All Ability to describe self-care measures that may prevent or decrease complications (Diabetes Survival Skills Education) will improve Add Today at 2012 - Progressing by Inez Pilgrim, RN Add  Today at [phone] - Progressing by Inez Pilgrim, RN Add Individualized Educational Video(s) Add Today at 2012 - Progressing by Inez Pilgrim, RN Add  Today at [phone] - Progressing by Inez Pilgrim, RN Add Coping: Add All Ability to adjust to condition or change in health will improve Add Today at 2012 - Progressing by Inez Pilgrim, RN Add  Today at [phone] - Progressing by Inez Pilgrim, RN Add Fluid Volume: Add All Ability to maintain a balanced intake and output will improve Add Today at 2012 - Progressing by Inez Pilgrim, RN Add  Today at [phone] - Progressing by Inez Pilgrim, RN Add Health Behavior/Discharge Planning: Add All Ability to identify and utilize available resources and services will improve Add Today at 2012 - Progressing by Inez Pilgrim, RN Add  Today at [phone] - Progressing by Inez Pilgrim, RN Add Ability to manage health-related needs will improve Add Today at 2012 - Progressing by Inez Pilgrim, RN Add  Today at [phone] - Progressing by Inez Pilgrim, RN Add Metabolic: Add All Ability to maintain appropriate glucose levels will improve Add Today at 2012 - Progressing by Inez Pilgrim, RN Add  Today at [phone] - Progressing by Inez Pilgrim, RN Add Nutritional: Add All Maintenance of adequate nutrition will improve Add Today at 2012 - Progressing by Inez Pilgrim, RN Add  Today at [phone] - Progressing by Inez Pilgrim, RN Add Progress toward achieving an optimal weight will improve Add Today at 2012 - Progressing by Inez Pilgrim, RN Add  Today at [phone] - Progressing by Inez Pilgrim, RN Add Skin Integrity: Add All Risk for  impaired skin integrity will decrease Add Today at 2012 - Progressing by Inez Pilgrim, RN Add  Today at [phone] - Progressing by Inez Pilgrim, RN Add Tissue Perfusion: Add All Adequacy of tissue perfusion will improve Add Today at 2012 - Progressing by Inez Pilgrim, RN Add  Today at [phone] - Progressing by Inez Pilgrim, RN Add Education: Add All Knowledge of General Education information will improve Add Today at 2012 - Progressing by Inez Pilgrim, RN Add  Today at [phone] - Progressing by Inez Pilgrim, RN Add Health Behavior/Discharge Planning: Add All Ability to manage health-related needs will improve Add Today at 2012 - Progressing by Inez Pilgrim, RN Add  Today at [phone] - Progressing by Inez Pilgrim, RN Add Clinical Measurements: Add All Ability to maintain clinical measurements within normal limits will improve Add Today at 2012 - Progressing by Inez Pilgrim, RN Add  Today at [phone] - Progressing by Inez Pilgrim, RN Add Will remain free from infection Add Today at 2012 - Progressing by Inez Pilgrim, RN Add  Today at [phone] - Progressing by Inez Pilgrim, RN Add Diagnostic test results will improve Add Today at 2012 - Progressing by Inez Pilgrim, RN Add  Today at [phone] - Progressing by Inez Pilgrim, RN Add Respiratory complications will improve Add Today at 2012 - Progressing by Inez Pilgrim, RN Add  Today at [phone] - Progressing by Inez Pilgrim, RN Add Cardiovascular complication will be avoided Add Today at 2012 - Progressing by Inez Pilgrim, RN Add  Today at 74 - Progressing by Inez Pilgrim, RN Add Activity: Add All Risk for activity intolerance will decrease Add Today at 2012 - Progressing by Inez Pilgrim, RN Add  Today at [phone] - Progressing by Inez Pilgrim, RN Add Nutrition: Add All Adequate nutrition  will be maintained Add Today at 2012 - Progressing by Inez Pilgrim, RN Add  Today at [phone] - Progressing by Inez Pilgrim, RN Add Coping: Add All Level of anxiety will decrease Add Today at 2012 - Progressing by Inez Pilgrim, RN Add  Today at [phone] - Progressing by Inez Pilgrim, RN Add Elimination: Add All Will not experience complications related to bowel motility Add Today at 2012 - Progressing by Inez Pilgrim, RN Add  Today at [phone] - Progressing by Inez Pilgrim, RN Add Will not experience complications related to urinary retention Add Today at 2012 - Progressing by Inez Pilgrim, RN Add  Today at [phone] - Progressing by Inez Pilgrim, RN Add Pain Managment: Add All General experience of comfort will improve and/or be controlled Add Today at 2012 - Progressing by Inez Pilgrim, RN Add  Today at [phone] - Progressing by Inez Pilgrim, RN Add Safety: Add All Ability to remain free from injury will improve Add Today at 2012 - Progressing by Inez Pilgrim, RN Add  Today at [phone] - Progressing by Inez Pilgrim, RN Add Skin Integrity: Add All Risk for impaired skin integrity will decrease Add Today at 2012 - Progressing by Inez Pilgrim, RN Add

## 2023-07-05 NOTE — Plan of Care (Signed)
  Problem: Education: Goal: Ability to describe self-care measures that may prevent or decrease complications (Diabetes Survival Skills Education) will improve Outcome: Progressing   Problem: Coping: Goal: Ability to adjust to condition or change in health will improve Outcome: Progressing   Problem: Fluid Volume: Goal: Ability to maintain a balanced intake and output will improve Outcome: Progressing   Problem: Metabolic: Goal: Ability to maintain appropriate glucose levels will improve Outcome: Progressing   Problem: Skin Integrity: Goal: Risk for impaired skin integrity will decrease Outcome: Progressing   Problem: Education: Goal: Knowledge of General Education information will improve Description: Including pain rating scale, medication(s)/side effects and non-pharmacologic comfort measures Outcome: Progressing

## 2023-07-05 NOTE — Consult Note (Signed)
 Hospital Consult    Reason for Consult:  Left Lower Extremity Gangrene  Requesting Physician:  Dr Londell Moh MD  MRN #:  [id number]  History of Present Illness: This is a 68 y.o. male with history of non-insulin-dependent diabetes mellitus, has since stopped taking metformin, hypertension, hyperlipidemia, who presents emergency department for chief concerns of black toes on the left foot.   On exam this morning the patient is resting comfortably in bed.  Left lower extremity has multiple toes that are necrotic and gangrene.  He does endorse some pain to his left lower extremity.  He endorses the pain is more likely to be sharp when he is while up and walking around.  Patient states his toes have gotten worse in the last 2 to 3 weeks.  Vascular surgery consulted to evaluate.  Past Medical History:  Diagnosis Date   Atherosclerosis    Cancer (HCC)    CHF (congestive heart failure) (HCC)    Chronic kidney disease    kidney stones, UTI   Colon cancer (HCC)    Coronary artery disease    Diabetes mellitus without complication (HCC)    GERD (gastroesophageal reflux disease)    Gout    Hematuria    Hyperlipidemia    Hypertension    Iron deficiency anemia    Myocardial infarction [facility]) 2005   Peripheral vascular disease (HCC)    Pulmonary nodules    Rectal cancer (HCC)    Ureter, stricture    Wears dentures    full upper    Past Surgical History:  Procedure Laterality Date   BOWEL RESECTION N/A 10/23/2014   Procedure: LOW ANTERIOR BOWEL RESECTION;  Surgeon: Natale Lay, MD;  Location: [facility];  Service: General;  Laterality: N/A;   COLON SURGERY     COLONOSCOPY 06/06/14     COLONOSCOPY WITH ESOPHAGOGASTRODUODENOSCOPY (EGD)     CORONARY ANGIOPLASTY WITH STENT PLACEMENT  03/2004   CYSTOSCOPY WITH STENT PLACEMENT Bilateral 10/23/2014   Procedure: CYSTOSCOPY WITH STENT PLACEMENT,URETHRAL DILATION, LEFT RETROGRADE PYELOGRAM, URETEROSCOPY;  Surgeon: Vanna Scotland, MD;  Location: [facility];   Service: Urology;  Laterality: Bilateral;   DIVERTING ILEOSTOMY N/A 10/23/2014   Procedure: DIVERTING ILEOSTOMY;  Surgeon: Natale Lay, MD;  Location: [facility];  Service: General;  Laterality: N/A;   ILEOSTOMY CLOSURE N/A 09/08/2015   Procedure: ILEOSTOMY TAKEDOWN;  Surgeon: Ricarda Frame, MD;  Location: [facility];  Service: General;  Laterality: N/A;   LAPAROTOMY N/A 10/23/2014   Procedure: EXPLORATORY LAPAROTOMY;  Surgeon: Natale Lay, MD;  Location: [facility];  Service: General;  Laterality: N/A;   PERIPHERAL VASCULAR CATHETERIZATION N/A 05/12/2015   Procedure: Abdominal Aortogram w/Lower Extremity;  Surgeon: Annice Needy, MD;  Location: [facility];  Service: Cardiovascular;  Laterality: N/A;   PERIPHERAL VASCULAR CATHETERIZATION  05/12/2015   Procedure: Lower Extremity Intervention;  Surgeon: Annice Needy, MD;  Location: [facility];  Service: Cardiovascular;;   PERIPHERAL VASCULAR CATHETERIZATION N/A 06/30/2015   Procedure: Abdominal Aortogram w/Lower Extremity;  Surgeon: Annice Needy, MD;  Location: [facility];  Service: Cardiovascular;  Laterality: N/A;   PERIPHERAL VASCULAR CATHETERIZATION  06/30/2015   Procedure: Lower Extremity Intervention;  Surgeon: Annice Needy, MD;  Location: [facility];  Service: Cardiovascular;;   PORT-A-CATH REMOVAL Right 09/08/2015   Procedure: REMOVAL PORT-A-CATH;  Surgeon: Ricarda Frame, MD;  Location: [facility];  Service: General;  Laterality: Right;   PORTACATH PLACEMENT  07/01/2014   Dr. Egbert Garibaldi  Allergies  Allergen Reactions   Entresto [Sacubitril-Valsartan] Cough   Lisinopril Cough    Prior to Admission medications   Medication Sig Start Date End Date Taking? Authorizing Provider  acetaminophen (TYLENOL) 500 MG tablet Take 500 mg by mouth every 6 (six) hours as needed.   Yes [provider]  Ascorbic Acid (VITAMIN C) 1000 MG tablet Take 1,000 mg by mouth daily. Reported on 09/26/2015   Yes [provider]   carvedilol (COREG) 3.125 MG tablet Take 3.125 mg by mouth 2 (two) times daily with a meal.   Yes [provider]  colchicine 0.6 MG tablet Take 0.6 mg by mouth daily.   Yes [provider]  furosemide (LASIX) 40 MG tablet Take 1 tablet (40 mg total) by mouth daily. 05/14/22  Yes Baron Hamper, MD  spironolactone (ALDACTONE) 25 MG tablet Take 0.5 tablets (12.5 mg total) by mouth daily. Patient taking differently: Take 12.5 mg by mouth 2 (two) times daily. 05/13/22  Yes Baron Hamper, MD  cloNIDine (CATAPRES) 0.2 MG tablet Take 0.2 mg by mouth 2 (two) times daily. Patient not taking: Reported on 07/04/2023    [provider]  Ferrous Sulfate (IRON) 325 (65 Fe) MG TABS Take 1 tablet by mouth every other day. Patient not taking: Reported on 05/18/2022 01/22/22   [provider]  losartan (COZAAR) 25 MG tablet Take 25 mg by mouth daily. On hold since hospitalization Patient not taking: Reported on 05/18/2022    [provider]  pravastatin (PRAVACHOL) 20 MG tablet Take 20 mg by mouth daily. Patient not taking: Reported on 05/18/2022    [provider]    Social History   Socioeconomic History   Marital status: Married    Spouse name: Not on file   Number of children: Not on file   Years of education: Not on file   Highest education level: Not on file  Occupational History   Not on file  Tobacco Use   Smoking status: Former    Current packs/day: 0.00    Types: Cigarettes    Quit date: 11/19/1984    Years since quitting: 38.6   Smokeless tobacco: Never   Tobacco comments:    smoked for brief period in his teens  Vaping Use   Vaping status: Never Used  Substance and Sexual Activity   Alcohol use: No    Alcohol/week: 0.0 standard drinks of alcohol   Drug use: No   Sexual activity: Not Currently  Other Topics Concern   Not on file  Social History Narrative   Not on file   Social Drivers of Health   Financial Resource Strain: Not on  file  Food Insecurity: No Food Insecurity (07/04/2023)   Hunger Vital Sign    Worried About Running Out of Food in the Last Year: Never true    Ran Out of Food in the Last Year: Never true  Transportation Needs: No Transportation Needs (07/04/2023)   PRAPARE - Administrator, Civil Service (Medical): No    Lack of Transportation (Non-Medical): No  Physical Activity: Not on file  Stress: Not on file  Social Connections: Moderately Integrated (07/04/2023)   Social Connection and Isolation Panel [NHANES]    Frequency of Communication with Friends and Family: Three times a week    Frequency of Social Gatherings with Friends and Family: More than three times a week    Attends Religious Services: More than 4 times per year    Active Member of  Clubs or Organizations: No    Attends Banker Meetings: Never    Marital Status: Married  Catering manager Violence: Not At Risk (07/04/2023)   Humiliation, Afraid, Rape, and Kick questionnaire    Fear of Current or Ex-Partner: No    Emotionally Abused: No    Physically Abused: No    Sexually Abused: No     Family History  Problem Relation Age of Onset   Hypertension Brother    Hypertension Father    Diabetes Father    Diabetes Brother    Hypertension Brother    Heart disease Paternal Grandfather     ROS: Otherwise negative unless mentioned in HPI  Physical Examination  Vitals:   07/04/23 2236 07/05/23 0416  BP: (!) 137/91 133/78  Pulse: 73 76  Resp: 16 16  Temp: 97.8 F (36.6 C) 99.2 F (37.3 C)  SpO2: 100% 95%   Body mass index is 23.64 kg/m.  General:  WDWN in NAD Gait: Not observed HENT: WNL, normocephalic Pulmonary: normal non-labored breathing, without Rales, rhonchi,  wheezing Cardiac: regular, without  Murmurs, rubs or gallops; without carotid bruits Abdomen: Positive bowel sounds throughout, soft, NT/ND, no masses Skin: without rashes Vascular Exam/Pulses: Unable to palpate DP/PT pulses of left  lower extremity, foot is cool to touch.  Right lower extremity warm to touch with palpable DP PT pulses. Extremities: with ischemic changes, with Gangrene , without cellulitis; with open wounds;  Musculoskeletal: no muscle wasting or atrophy  Neurologic: A&O X 3;  No focal weakness or paresthesias are detected; speech is fluent/normal Psychiatric:  The pt has Normal affect. Lymph:  Unremarkable  CBC    Component Value Date/Time   WBC 8.5 07/05/2023 0434   RBC 4.36 07/05/2023 0434   HGB 11.1 (L) 07/05/2023 0434   HGB 11.7 (L) 08/12/2014 0858   HCT 35.4 (L) 07/05/2023 0434   HCT 37.1 (L) 08/12/2014 0858   PLT 360 07/05/2023 0434   PLT 282 08/12/2014 0858   MCV 81.2 07/05/2023 0434   MCV 72 (L) 08/12/2014 0858   MCH 25.5 (L) 07/05/2023 0434   MCHC 31.4 07/05/2023 0434   RDW 14.4 07/05/2023 0434   RDW 20.6 (H) 08/12/2014 0858   LYMPHSABS 0.8 07/04/2023 1318   LYMPHSABS 0.3 (L) 08/12/2014 0858   MONOABS 0.8 07/04/2023 1318   MONOABS 0.7 08/12/2014 0858   EOSABS 0.2 07/04/2023 1318   EOSABS 0.2 08/12/2014 0858   BASOSABS 0.0 07/04/2023 1318   BASOSABS 0.0 08/12/2014 0858    BMET    Component Value Date/Time   NA 139 07/05/2023 0434   NA 132 (L) 08/12/2014 0858   K 3.8 07/05/2023 0434   K 4.0 08/12/2014 0858   CL 104 07/05/2023 0434   CL 100 (L) 08/12/2014 0858   CO2 23 07/05/2023 0434   CO2 24 08/12/2014 0858   GLUCOSE 94 07/05/2023 0434   GLUCOSE 118 (H) 08/12/2014 0858   BUN 26 (H) 07/05/2023 0434   BUN 30 (H) 08/12/2014 0858   CREATININE 1.40 (H) 07/05/2023 0434   CREATININE 1.52 (H) 08/12/2014 0858   CALCIUM 9.3 07/05/2023 0434   CALCIUM 8.9 08/12/2014 0858   GFRNONAA 54 (L) 07/05/2023 0434   GFRNONAA 49 (L) 08/12/2014 0858   GFRAA >60 01/10/2020 0940   GFRAA 57 (L) 08/12/2014 0858    COAGS: Lab Results  Component Value Date   INR 1.00 08/28/2015     Non-Invasive Vascular Imaging:   Ultrasound arterial ABI of the left lower extremity  is ordered.  No  results at this time.  Statin:  Yes.   Beta Blocker:  Yes.   Aspirin:  No. ACEI:  No. ARB:  Yes.   CCB use:  No Other antiplatelets/anticoagulants:  No.    ASSESSMENT/PLAN: This is a 68 y.o. male who presents to [facility] emergency department with chief concerns of black toes on left foot.  Ultrasound with ABIs ordered for left lower extremity.  No results at this time.  Vascular surgery plans on taking the patient to the vascular lab on 07/06/2023 for left lower extremity angiogram with possible intervention.  I discussed at the bedside with the patient in detail today the procedure, benefits, risk, and complications.  Patient verbalizes understanding and wishes to proceed as soon as possible.  I answered all the patient's questions this morning.  Patient will be made n.p.o. after midnight the day of his procedure.   -I discussed the case in detail with Dr. [name] MD and he agrees with the plan.   Marcie Bal [facility] 07/05/2023 7:11 AM

## 2023-07-05 NOTE — Consult Note (Signed)
 ORTHOPAEDIC CONSULTATION  REQUESTING PHYSICIAN: Gillis Santa, MD  Chief Complaint: Gangrene left foot  HPI: [name] EDMISTER [name] is a 68 y.o. male who complains of worsening pain with darkening discoloration of the toes to his left foot.  He states this has been progressive over the last few weeks but has progressed quite quickly within the last week or so.  Denies any trauma to the area.  Past Medical History:  Diagnosis Date   Atherosclerosis    Cancer (HCC)    CHF (congestive heart failure) (HCC)    Chronic kidney disease    kidney stones, UTI   Colon cancer (HCC)    Coronary artery disease    Diabetes mellitus without complication (HCC)    GERD (gastroesophageal reflux disease)    Gout    Hematuria    Hyperlipidemia    Hypertension    Iron deficiency anemia    Myocardial infarction [facility]) 2005   Peripheral vascular disease (HCC)    Pulmonary nodules    Rectal cancer (HCC)    Ureter, stricture    Wears dentures    full upper   Past Surgical History:  Procedure Laterality Date   BOWEL RESECTION N/A 10/23/2014   Procedure: LOW ANTERIOR BOWEL RESECTION;  Surgeon: Natale Lay, MD;  Location: [facility];  Service: General;  Laterality: N/A;   COLON SURGERY     COLONOSCOPY 06/06/14     COLONOSCOPY WITH ESOPHAGOGASTRODUODENOSCOPY (EGD)     CORONARY ANGIOPLASTY WITH STENT PLACEMENT  03/2004   CYSTOSCOPY WITH STENT PLACEMENT Bilateral 10/23/2014   Procedure: CYSTOSCOPY WITH STENT PLACEMENT,URETHRAL DILATION, LEFT RETROGRADE PYELOGRAM, URETEROSCOPY;  Surgeon: Vanna Scotland, MD;  Location: [facility];  Service: Urology;  Laterality: Bilateral;   DIVERTING ILEOSTOMY N/A 10/23/2014   Procedure: DIVERTING ILEOSTOMY;  Surgeon: Natale Lay, MD;  Location: [facility];  Service: General;  Laterality: N/A;   ILEOSTOMY CLOSURE N/A 09/08/2015   Procedure: ILEOSTOMY TAKEDOWN;  Surgeon: Ricarda Frame, MD;  Location: [facility];  Service: General;  Laterality: N/A;   LAPAROTOMY N/A 10/23/2014   Procedure:  EXPLORATORY LAPAROTOMY;  Surgeon: Natale Lay, MD;  Location: [facility];  Service: General;  Laterality: N/A;   PERIPHERAL VASCULAR CATHETERIZATION N/A 05/12/2015   Procedure: Abdominal Aortogram w/Lower Extremity;  Surgeon: Annice Needy, MD;  Location: [facility];  Service: Cardiovascular;  Laterality: N/A;   PERIPHERAL VASCULAR CATHETERIZATION  05/12/2015   Procedure: Lower Extremity Intervention;  Surgeon: Annice Needy, MD;  Location: [facility];  Service: Cardiovascular;;   PERIPHERAL VASCULAR CATHETERIZATION N/A 06/30/2015   Procedure: Abdominal Aortogram w/Lower Extremity;  Surgeon: Annice Needy, MD;  Location: [facility];  Service: Cardiovascular;  Laterality: N/A;   PERIPHERAL VASCULAR CATHETERIZATION  06/30/2015   Procedure: Lower Extremity Intervention;  Surgeon: Annice Needy, MD;  Location: [facility];  Service: Cardiovascular;;   PORT-A-CATH REMOVAL Right 09/08/2015   Procedure: REMOVAL PORT-A-CATH;  Surgeon: Ricarda Frame, MD;  Location: [facility];  Service: General;  Laterality: Right;   PORTACATH PLACEMENT  07/01/2014   Dr. Egbert Garibaldi   Social History   Socioeconomic History   Marital status: Married    Spouse name: Not on file   Number of children: Not on file   Years of education: Not on file   Highest education level: Not on file  Occupational History   Not on file  Tobacco Use   Smoking status: Former    Current packs/day: 0.00    Types: Cigarettes  Quit date: 11/19/1984    Years since quitting: 38.6   Smokeless tobacco: Never   Tobacco comments:    smoked for brief period in his teens  Vaping Use   Vaping status: Never Used  Substance and Sexual Activity   Alcohol use: No    Alcohol/week: 0.0 standard drinks of alcohol   Drug use: No   Sexual activity: Not Currently  Other Topics Concern   Not on file  Social History Narrative   Not on file   Social Drivers of Health   Financial Resource Strain: Not on file  Food Insecurity: No  Food Insecurity (07/04/2023)   Hunger Vital Sign    Worried About Running Out of Food in the Last Year: Never true    Ran Out of Food in the Last Year: Never true  Transportation Needs: No Transportation Needs (07/04/2023)   PRAPARE - Administrator, Civil Service (Medical): No    Lack of Transportation (Non-Medical): No  Physical Activity: Not on file  Stress: Not on file  Social Connections: Moderately Integrated (07/04/2023)   Social Connection and Isolation Panel [NHANES]    Frequency of Communication with Friends and Family: Three times a week    Frequency of Social Gatherings with Friends and Family: More than three times a week    Attends Religious Services: More than 4 times per year    Active Member of [employer] or Organizations: No    Attends Engineer, structural: Never    Marital Status: Married   Family History  Problem Relation Age of Onset   Hypertension Brother    Hypertension Father    Diabetes Father    Diabetes Brother    Hypertension Brother    Heart disease Paternal Grandfather    Allergies  Allergen Reactions   Entresto [Sacubitril-Valsartan] Cough   Lisinopril Cough   Prior to Admission medications   Medication Sig Start Date End Date Taking? Authorizing Provider  acetaminophen (TYLENOL) 500 MG tablet Take 500 mg by mouth every 6 (six) hours as needed.   Yes [provider]  Ascorbic Acid (VITAMIN C) 1000 MG tablet Take 1,000 mg by mouth daily. Reported on 09/26/2015   Yes [provider]  carvedilol (COREG) 3.125 MG tablet Take 3.125 mg by mouth 2 (two) times daily with a meal.   Yes [provider]  colchicine 0.6 MG tablet Take 0.6 mg by mouth daily.   Yes [provider]  furosemide (LASIX) 40 MG tablet Take 1 tablet (40 mg total) by mouth daily. 05/14/22  Yes Baron Hamper, MD  spironolactone (ALDACTONE) 25 MG tablet Take 0.5 tablets (12.5 mg total) by mouth daily. Patient taking differently: Take 12.5  mg by mouth 2 (two) times daily. 05/13/22  Yes Baron Hamper, MD  cloNIDine (CATAPRES) 0.2 MG tablet Take 0.2 mg by mouth 2 (two) times daily. Patient not taking: Reported on 07/04/2023    [provider]  Ferrous Sulfate (IRON) 325 (65 Fe) MG TABS Take 1 tablet by mouth every other day. Patient not taking: Reported on 05/18/2022 01/22/22   [provider]  losartan (COZAAR) 25 MG tablet Take 25 mg by mouth daily. On hold since hospitalization Patient not taking: Reported on 05/18/2022    [provider]  pravastatin (PRAVACHOL) 20 MG tablet Take 20 mg by mouth daily. Patient not taking: Reported on 05/18/2022    [provider]   US ARTERIAL ABI (SCREENING LOWER EXTREMITY) Result Date: 07/05/2023 CLINICAL DATA:  Per vascular disease, diabetes and gangrene with osteomyelitis of the left foot. EXAM: NONINVASIVE PHYSIOLOGIC VASCULAR STUDY OF BILATERAL LOWER EXTREMITIES TECHNIQUE: Evaluation of both lower extremities were performed at rest, including calculation of ankle-brachial indices with single level Doppler, pressure and pulse volume recording. COMPARISON:  None Available. FINDINGS: Right ABI:  0.53 Left ABI:  No detectable flow to calculate ABI. Right Lower Extremity: Monophasic posterior tibial waveform. No detectable flow in the dorsalis pedis artery. Left Lower Extremity: No detectable flow in either the posterior tibial or dorsalis pedis artery distally. 0.5-0.79 Moderate PAD IMPRESSION: 1. No detectable flow in the left posterior tibial or dorsalis pedis arteries distally. ABI therefore cannot be calculated. 2. Moderately depressed resting ankle-brachial index of 0.53 on the right. Monophasic right posterior tibial artery waveform. No flow detectable in the right dorsalis pedis artery. Electronically Signed   By: Irish Lack M.D.   On: 07/05/2023 12:27   DG Foot Complete Left Result Date: 07/04/2023 CLINICAL DATA:  Necrotic tissue with toes. EXAM: LEFT FOOT -  COMPLETE 3+ VIEW COMPARISON:  None Available. FINDINGS: There is near complete erosion of the head of the second metatarsal. There is erosion the head of the third metatarsal along the medial border. Erosion of bone at the base of the second and third proximal phalanges. Bite likely erosions in the head of the first metatarsal. IMPRESSION: Clear osteomyelitis at the heads of the second and third metatarsals and adjacent proximal phalanges. Probable osteomyelitis head of the first metatarsal versus aggressive arthropathy Electronically Signed   By: Genevive Bi M.D.   On: 07/04/2023 15:40    Positive ROS: All other systems have been reviewed and were otherwise negative with the exception of those mentioned in the HPI and as above.  12 point ROS was performed.  Physical Exam: General: Alert and oriented.  No apparent distress.  Vascular:  Left foot:Dorsalis Pedis:  absent Posterior Tibial:  absent  Right foot: Dorsalis Pedis:  absent Posterior Tibial:  absent  Neuro:absent protective sensation  Derm: Right foot without ulceration or wound.  Severe necrosis of the left second third fourth and partially fifth toes.  Necrosis extends to the metatarsophalangeal joint region.  Mild surrounding erythema to the area.  Foul odor from the necrotic tissue at this time.  Ortho/MS: Diffuse edema to the left foot and ankle.       Assessment: Gangrene left forefoot Diabetes with peripheral vascular disease  Plan: Patient seen by vascular surgery today.  They are planning on angio tomorrow.  He will need at least transmetatarsal amputation to this left foot.  I discussed this today with the patient and his daughter and son were present during this time as well.  Patient expressed understanding.  Podiatry will continue to follow in house and likely plan on surgery for this coming Friday pending revascularization.    Irean Hong, DPM Cell [phone]   07/05/2023 12:45 PM

## 2023-07-06 ENCOUNTER — Encounter
Admission: EM | Disposition: A | Discharge status: Skilled Nursing Facility | Payer: Self-pay | Source: Home / Self Care | Attending: Student

## 2023-07-06 ENCOUNTER — Encounter: Payer: Self-pay | Admitting: Vascular Surgery

## 2023-07-06 DIAGNOSIS — I708 Atherosclerosis of other arteries: Secondary | ICD-10-CM

## 2023-07-06 DIAGNOSIS — M869 Osteomyelitis, unspecified: Secondary | ICD-10-CM | POA: Diagnosis not present

## 2023-07-06 DIAGNOSIS — T82858A Stenosis of vascular prosthetic devices, implants and grafts, initial encounter: Secondary | ICD-10-CM

## 2023-07-06 DIAGNOSIS — I70262 Atherosclerosis of native arteries of extremities with gangrene, left leg: Secondary | ICD-10-CM | POA: Diagnosis not present

## 2023-07-06 DIAGNOSIS — Z9889 Other specified postprocedural states: Secondary | ICD-10-CM | POA: Diagnosis not present

## 2023-07-06 HISTORY — PX: LOWER EXTREMITY ANGIOGRAPHY: CATH118251

## 2023-07-06 LAB — FOLATE: Folate: 11.9 ng/mL (ref >5.9)

## 2023-07-06 LAB — ECHOCARDIOGRAM COMPLETE
Area-P 1/2: 2.89 cm2
Est EF: 20
Height: 68 in
S' Lateral: 4.7 cm
Weight: 2488 [oz_av]

## 2023-07-06 LAB — BASIC METABOLIC PANEL
Anion gap: 12 (ref 5–15)
BUN: 30 mg/dL — ABNORMAL HIGH (ref 8–23)
CO2: 20 mmol/L — ABNORMAL LOW (ref 22–32)
Calcium: 9.1 mg/dL (ref 8.9–10.3)
Chloride: 103 mmol/L (ref 98–111)
Creatinine, Ser: 1.31 mg/dL — ABNORMAL HIGH (ref 0.61–1.24)
GFR, Estimated: 59 mL/min — ABNORMAL LOW (ref >60)
Glucose, Bld: 112 mg/dL — ABNORMAL HIGH (ref 70–99)
Potassium: 3.6 mmol/L (ref 3.5–5.1)
Sodium: 135 mmol/L (ref 135–145)

## 2023-07-06 LAB — CBC
HCT: 33.7 % — ABNORMAL LOW (ref 39.0–52.0)
Hemoglobin: 10.7 g/dL — ABNORMAL LOW (ref 13.0–17.0)
MCH: 26.1 pg (ref 26.0–34.0)
MCHC: 31.8 g/dL (ref 30.0–36.0)
MCV: 82.2 fL (ref 80.0–100.0)
Platelets: 338 10*3/uL (ref 150–400)
RBC: 4.1 MIL/uL — ABNORMAL LOW (ref 4.22–5.81)
RDW: 14.5 % (ref 11.5–15.5)
WBC: 9.9 10*3/uL (ref 4.0–10.5)
nRBC: 0 % (ref 0.0–0.2)

## 2023-07-06 LAB — PHOSPHORUS: Phosphorus: 2.9 mg/dL (ref 2.5–4.6)

## 2023-07-06 LAB — IRON AND TIBC
Iron: 33 ug/dL — ABNORMAL LOW (ref 45–182)
Saturation Ratios: 19 % (ref 17.9–39.5)
TIBC: 178 ug/dL — ABNORMAL LOW (ref 250–450)
UIBC: 145 ug/dL

## 2023-07-06 LAB — GLUCOSE, CAPILLARY
Glucose-Capillary: 94 mg/dL (ref 70–99)
Glucose-Capillary: 94 mg/dL (ref 70–99)
Glucose-Capillary: 106 mg/dL — ABNORMAL HIGH (ref 70–99)
Glucose-Capillary: 89 mg/dL (ref 70–99)
Glucose-Capillary: 100 mg/dL — ABNORMAL HIGH (ref 70–99)

## 2023-07-06 LAB — MAGNESIUM: Magnesium: 1.9 mg/dL (ref 1.7–2.4)

## 2023-07-06 LAB — VITAMIN B12: Vitamin B-12: 1974 pg/mL — ABNORMAL HIGH (ref 180–914)

## 2023-07-06 SURGERY — LOWER EXTREMITY ANGIOGRAPHY
Anesthesia: Moderate Sedation | Laterality: Left

## 2023-07-06 MED ORDER — SODIUM BICARBONATE 650 MG PO TABS
650.0000 mg | ORAL_TABLET | Freq: Three times a day (TID) | ORAL | Status: AC
  Administered 2023-07-06 (×2): 650 mg via ORAL
  Filled 2023-07-06 (×3): qty 1

## 2023-07-06 MED ORDER — LIDOCAINE-EPINEPHRINE (PF) 1 %-1:200000 IJ SOLN
INTRAMUSCULAR | Status: DC | PRN
  Administered 2023-07-06: 10 mL

## 2023-07-06 MED ORDER — ASPIRIN 81 MG PO TBEC
81.0000 mg | DELAYED_RELEASE_TABLET | Freq: Every day | ORAL | Status: DC
  Administered 2023-07-06 – 2023-07-22 (×17): 81 mg via ORAL
  Filled 2023-07-06 (×17): qty 1

## 2023-07-06 MED ORDER — MIDAZOLAM HCL 2 MG/2ML IJ SOLN
INTRAMUSCULAR | Status: DC | PRN
  Administered 2023-07-06: 1 mg via INTRAVENOUS
  Administered 2023-07-06: 2 mg via INTRAVENOUS

## 2023-07-06 MED ORDER — PRAVASTATIN SODIUM 20 MG PO TABS
20.0000 mg | ORAL_TABLET | Freq: Every day | ORAL | Status: DC
  Administered 2023-07-06 – 2023-07-22 (×17): 20 mg via ORAL
  Filled 2023-07-06 (×17): qty 1

## 2023-07-06 MED ORDER — HEPARIN (PORCINE) IN NACL 1000-0.9 UT/500ML-% IV SOLN
INTRAVENOUS | Status: DC | PRN
  Administered 2023-07-06: 1000 mL

## 2023-07-06 MED ORDER — HYDROCODONE-ACETAMINOPHEN 5-325 MG PO TABS
1.0000 | ORAL_TABLET | Freq: Once | ORAL | Status: AC
  Administered 2023-07-06: 1 via ORAL
  Filled 2023-07-06: qty 1

## 2023-07-06 MED ORDER — HEPARIN SODIUM (PORCINE) 1000 UNIT/ML IJ SOLN
INTRAMUSCULAR | Status: DC | PRN
  Administered 2023-07-06: 5000 [IU] via INTRAVENOUS

## 2023-07-06 MED ORDER — HEPARIN SODIUM (PORCINE) 1000 UNIT/ML IJ SOLN
INTRAMUSCULAR | Status: AC
  Filled 2023-07-06: qty 10

## 2023-07-06 MED ORDER — FENTANYL CITRATE (PF) 100 MCG/2ML IJ SOLN
INTRAMUSCULAR | Status: AC
  Filled 2023-07-06: qty 2

## 2023-07-06 MED ORDER — IODIXANOL 320 MG/ML IV SOLN
INTRAVENOUS | Status: DC | PRN
Start: 2023-07-06 — End: 2023-07-06
  Administered 2023-07-06: 45 mL

## 2023-07-06 MED ORDER — SPIRONOLACTONE 12.5 MG HALF TABLET
12.5000 mg | ORAL_TABLET | Freq: Every day | ORAL | Status: DC
  Administered 2023-07-07 – 2023-07-12 (×6): 12.5 mg via ORAL
  Filled 2023-07-06 (×7): qty 1

## 2023-07-06 MED ORDER — MIDAZOLAM HCL 5 MG/5ML IJ SOLN
INTRAMUSCULAR | Status: AC
  Filled 2023-07-06: qty 5

## 2023-07-06 MED ORDER — FENTANYL CITRATE (PF) 100 MCG/2ML IJ SOLN
INTRAMUSCULAR | Status: DC | PRN
  Administered 2023-07-06: 50 ug via INTRAVENOUS
  Administered 2023-07-06: 25 ug via INTRAVENOUS

## 2023-07-06 SURGICAL SUPPLY — 18 items
BALLN ARMADA 12X60X80 (BALLOONS) ×1 IMPLANT
BALLN LUTONIX DCB 7X60X130 (BALLOONS) ×1 IMPLANT
BALLOON ARMADA 12X60X80 (BALLOONS) IMPLANT
BALLOON LUTONIX DCB 7X60X130 (BALLOONS) IMPLANT
CATH ANGIO 5F PIGTAIL 65CM (CATHETERS) IMPLANT
COVER PROBE ULTRASOUND 5X96 (MISCELLANEOUS) IMPLANT
DEVICE PRESTO INFLATION (MISCELLANEOUS) IMPLANT
DEVICE STARCLOSE SE CLOSURE (Vascular Products) IMPLANT
GLIDEWIRE ADV .035X260CM (WIRE) IMPLANT
INTRODUCER 7FR 23CM (INTRODUCER) IMPLANT
PACK ANGIOGRAPHY (CUSTOM PROCEDURE TRAY) ×1 IMPLANT
SHEATH BRITE TIP 5FRX11 (SHEATH) IMPLANT
STENT LIFESTREAM 9X58X80 (Permanent Stent) IMPLANT
STENT VIABAHN 8X50X120 (Permanent Stent) IMPLANT
SYR MEDRAD MARK 7 150ML (SYRINGE) IMPLANT
TUBING CONTRAST HIGH PRESS 72 (TUBING) IMPLANT
WIRE G V18X300CM (WIRE) IMPLANT
WIRE J 3MM .035X145CM (WIRE) IMPLANT

## 2023-07-06 NOTE — Progress Notes (Signed)
 [facility] Progress Note  Patient: [name] [name]    WUJ:[id number]  DOA: 07/04/2023     Date of Service: the patient was seen and examined on 07/06/2023  Chief Complaint  Patient presents with   Wound Infection   Brief hospital course: [name] [name], [name] is a 68 year old male with history of non-insulin-dependent diabetes mellitus, has since stopped taking metformin, hypertension, hyperlipidemia, who presents emergency department for chief concerns of black toes on the left foot. He reports that he has noticed his toes bothering him and hurting with ambulation for about 1.5 months.  Denies any pain at rest, it hurts only when he is ambulating or putting weight on his left foot.  Vitals in the ED showed temperature of 99, respiration rate of 15, heart rate 70, blood pressure 129/80, SpO2 97% on room air.   Serum sodium is 141, potassium 3.9, chloride 107, bicarb 23, BUN of 24, serum creatinine of 1.26, EGFR greater than 60, nonfasting blood glucose 128, WBC 10.6, hemoglobin 10.8, platelets of 334.   Lactic acid is 0.9.   ED treatment: Zosyn, vancomycin.   Assessment and Plan:  # Foot osteomyelitis, left  Blood cultures x 2 are in process per EDP S/p vancomycin and cefepime, changed antibiotics to Unasyn and Zyvox Pharmacy following for antibiotics Podiatry consulted, pt will need left TMA after LLE bypass surgery next week   Severe PAD of lower extremities ABI: 1. No detectable flow in the left posterior tibial or dorsalis pedis arteries distally. ABI therefore cannot be calculated. 2. Moderately depressed resting ankle-brachial index of 0.53 on the right. Monophasic right posterior tibial artery waveform. No flow detectable in the right dorsalis pedis artery.  Vascular surgery consulted, s/p angiogram done on 3/19, found to have severe atherosclerosis show vascular surgery recommended left lower extremity bypass surgery after cardiology clearance. Cardiology  consulted for clearance, patient needs LLE bypass surgery Vascular surgery planning to schedule surgery on Monday 07/11/2023   Cardiomyopathy Chronic systolic CHF, compensated. Patient had complete echo on 05/08/2022: With estimated ejection fraction < 20% Does not appear to be in acute exacerbation at this time Home carvedilol 3.125 mg p.o. twice daily with meals,  Held Lasix and spironolactone home meds due to elevated Cr 3/19 creatinine 1.31, bicarb 20, started oral bicarbonate Strict I's and O's Monitor renal functions and urine output CXR: No active disease Follow repeat 2D echocardiogram Follow cardiology for further recommendation and clearance for LLE bypass surgery.   Diabetes mellitus (HCC) HbA1c 6.3, well-controlled Patient has stopped taking metformin per outpatient provider Continue NovoLog sliding scale Monitor CBG, continue diabetic diet  History of rectal cancer Continue outpatient follow-up with hematology/oncology   HTN (hypertension) Home carvedilol 3.125 mg p.o. twice daily with meals resumed on admission Use hydralazine as needed Monitor BP and titrate medications accordingly  Anemia hemoglobin 10.7 on 3/19 Follow anemia workup  Body mass index is 23.64 kg/m.  Interventions:  Diet: Heart healthy diet DVT Prophylaxis: Subcutaneous Heparin    Advance goals of care discussion: Full code  Family Communication: family was not present at bedside, at the time of interview.  The pt provided permission to discuss medical plan with the family. Opportunity was given to ask question and all questions were answered satisfactorily.   Disposition:  Pt is from Home, admitted with left toe osteomyelitis, on IV antibiotics, angiogram scheduled on 3/19, will need surgical intervention by podiatry, which precludes a safe discharge. Discharge to home, when stable and cleared by vascular surgery  and podiatry.  Subjective: No significant events overnight, patient was  lying comfortably, pain is under control.  Denied any active issues.  Patient was seen after left lower extremity angiogram, tolerated procedure well.   Physical Exam: General: NAD, lying comfortably Appear in no distress, affect appropriate Eyes: PERRLA ENT: Oral Mucosa Clear, moist  Neck: no JVD,  Cardiovascular: S1 and S2 Present, no Murmur,  Respiratory: good respiratory effort, Bilateral Air entry equal and Decreased, no Crackles, no wheezes Abdomen: Bowel Sound present, Soft and no tenderness,  Skin: no rashes Extremities: no Pedal edema, no calf tenderness, left foot toe erythematous, tender and foul-smelling, blackish discoloration noticed, Neurologic: without any new focal findings Gait not checked due to patient safety concerns  Vitals:   07/06/23 0956 07/06/23 1000 07/06/23 1015 07/06/23 1050  BP:  139/79 129/87 (!) 141/82  Pulse: 64 67 64 65  Resp: (!) 24 (!) 21 18 16   Temp:    98.3 F (36.8 C)  TempSrc:      SpO2: 96% 96% 97% 98%  Weight:      Height:        Intake/Output Summary (Last 24 hours) at 07/06/2023 1134 Last data filed at 07/05/2023 2230 Gross per 24 hour  Intake 480 ml  Output 300 ml  Net 180 ml   Filed Weights   07/04/23 1313 07/04/23 2236  Weight: 68 kg 70.5 kg    Data Reviewed: I have personally reviewed and interpreted daily labs, tele strips, imagings as discussed above. I reviewed all nursing notes, pharmacy notes, vitals, pertinent old records I have discussed plan of care as described above with RN and patient/family.  CBC: Recent Labs  Lab 07/04/23 1318 07/05/23 0434 07/06/23 0218  WBC 10.6* 8.5 9.9  NEUTROABS 8.7*  --   --   HGB 10.8* 11.1* 10.7*  HCT 35.0* 35.4* 33.7*  MCV 82.4 81.2 82.2  PLT 335 360 338   Basic Metabolic Panel: Recent Labs  Lab 07/04/23 1318 07/05/23 0434 07/06/23 0218  NA 141 139 135  K 3.9 3.8 3.6  CL 107 104 103  CO2 23 23 20*  GLUCOSE 128* 94 112*  BUN 24* 26* 30*  CREATININE 1.26* 1.40*  1.31*  CALCIUM 9.5 9.3 9.1  MG  --   --  1.9  PHOS  --   --  2.9    Studies: PERIPHERAL VASCULAR CATHETERIZATION Result Date: 07/06/2023 See surgical note for result.  DG Chest Port 1 View Result Date: 07/05/2023 CLINICAL DATA:  Pleural effusion EXAM: PORTABLE CHEST 1 VIEW COMPARISON:  05/05/2022 FINDINGS: Heart borderline in size. Mediastinal contours within normal limits. Lungs clear. No effusions or acute bony abnormality. IMPRESSION: No active disease. Electronically Signed   By: Charlett Nose M.D.   On: 07/05/2023 20:26    Scheduled Meds:  vitamin C  1,000 mg Oral Daily   aspirin EC  81 mg Oral Daily   carvedilol  3.125 mg Oral BID WC   heparin  5,000 Units Subcutaneous Q8H   insulin aspart  0-5 Units Subcutaneous QHS   insulin aspart  0-9 Units Subcutaneous TID WC   pravastatin  20 mg Oral Daily   sodium bicarbonate  650 mg Oral TID   Continuous Infusions:  ampicillin-sulbactam (UNASYN) IV 3 g (07/06/23 0322)   linezolid (ZYVOX) IV 600 mg (07/05/23 2248)   PRN Meds: acetaminophen **OR** acetaminophen, hydrALAZINE, ondansetron **OR** ondansetron (ZOFRAN) IV, senna-docusate  Time spent: 55 minutes  Author: Gillis Santa. MD [facility] 07/06/2023 11:34  AM  To reach On-call, see care teams to locate the attending and reach out to them via [email or web address]. If 7PM-7AM, please contact night-coverage If you still have difficulty reaching the attending provider, please page the [facility] (Director on Call) for [facility] on amion for assistance.

## 2023-07-06 NOTE — Interval H&P Note (Signed)
 History and Physical Interval Note:  07/06/2023 8:02 AM  [name] Ribas [name]  has presented today for surgery, with the diagnosis of PAD.  The various methods of treatment have been discussed with the patient and family. After consideration of risks, benefits and other options for treatment, the patient has consented to  Procedure(s): Lower Extremity Angiography (Left) as a surgical intervention.  The patient's history has been reviewed, patient examined, no change in status, stable for surgery.  I have reviewed the patient's chart and labs.  Questions were answered to the patient's satisfaction.     [name]

## 2023-07-06 NOTE — Plan of Care (Signed)

## 2023-07-06 NOTE — Op Note (Signed)
 [facility]  Percutaneous Study/Intervention Procedural Note   Date of Surgery: 07/06/2023  Surgeon(s):Council Munguia    Assistants:none  Pre-operative Diagnosis: PAD with gangrene left lower extremity  Post-operative diagnosis:  Same  Procedure(s) Performed:             1.  Ultrasound guidance for vascular access right femoral artery             2.  Catheter placement into left common femoral artery from right femoral approach             3.  Aortogram and selective left lower extremity angiogram             4.  Stent placement to the left external iliac artery with 8 mm diameter by 5 cm length Viabahn stent             5.  Stent placement to the left common iliac artery with 9 mm diameter by 58 mm length Lifestream stent postdilated with a 12 mm balloon proximally  6.  StarClose closure device right femoral artery  EBL: 10 cc  Contrast: 45 cc  Fluoro Time: 5.4 minutes  Moderate Conscious Sedation Time: approximately 47 minutes using 3 mg of Versed and 75 mcg of Fentanyl              Indications:  Patient is a 68 y.o.male with gangrenous changes to the left toes with nonpalpable pedal pulses and a long history of peripheral arterial disease with multiple previous interventions. The patient is brought in for angiography for further evaluation and potential treatment.  Due to the limb threatening nature of the situation, angiogram was performed for attempted limb salvage. The patient is aware that if the procedure fails, amputation would be expected.  The patient also understands that even with successful revascularization, amputation may still be required due to the severity of the situation.  Risks and benefits are discussed and informed consent is obtained.   Procedure:  The patient was identified and appropriate procedural time out was performed.  The patient was then placed supine on the table and prepped and draped in the usual sterile fashion. Moderate  conscious sedation was administered during a face to face encounter with the patient throughout the procedure with my supervision of the RN administering medicines and monitoring the patient's vital signs, pulse oximetry, telemetry and mental status throughout from the start of the procedure until the patient was taken to the recovery room. Ultrasound was used to evaluate the right common femoral artery.  It was patent .  A digital ultrasound image was acquired.  A Seldinger needle was used to access the right common femoral artery under direct ultrasound guidance and a permanent image was performed.  A 0.035 J wire was advanced without resistance and a 5Fr sheath was placed.  Pigtail catheter was placed into the aorta and an AP aortogram was performed. This demonstrated normal renal arteries.  The right iliac artery had been previously stented and there was no significant stenosis in the right common or external iliac arteries.  The left common iliac artery was ectatic proximally but had a moderate stenosis in the 60% range.  The vessel normalized for several centimeters and then in the mid external iliac artery was a high-grade stenosis in the 85 to 90% range. I then crossed the aortic bifurcation and advanced to the left femoral head. Selective left lower extremity angiogram was then performed. This demonstrated disease in the distal left common femoral  artery with a high-grade stenosis at the origin of the profunda femoris artery in the 90% range.  The SFA had a flush occlusion and had been stented all the way up to the origin.  There were then occluded stents throughout the entirety of the SFA and down to the below-knee popliteal artery.  There were collaterals filling a posterior tibial artery in the proximal to mid calf and this was the only runoff distally but was a reasonably good vessel and did fill the foot. It was felt that it was in the patient's best interest to proceed with intervention to the iliac  lesions after these images to avoid a second procedure and a larger amount of contrast and fluoroscopy based off of the findings from the initial angiogram.  However, the profunda femoris, SFA and popliteal artery disease is going to be best managed with a profunda femoris endarterectomy and a femoral to distal bypass. The patient was systemically heparinized and a 7 French 23 cm sheath was then placed over the Terumo Advantage wire. I then used the pigtail catheter into the profunda femoris artery and exchanged for a V18 wire.  An 8 mm diameter by 5 cm length Viabahn stent was then deployed in the left external iliac artery and postdilated with a 7 mm diameter Lutonix drug-coated balloon.  I then reexchanged for the advantage wire and then selected a 9 mm diameter by 58 mm length Lifestream stent for the left common iliac artery lesion bridging down just into the Viabahn stent.  This was inflated to 12 atm.  Proximally, I upsized to a 12 mm diameter balloon to get better wall apposition in the ectatic left common iliac artery proximal to the stenosis.  The 12 mm balloon was inflated to 8 atm.  Completion imaging showed no significant residual stenosis in the left common or external iliac arteries after stent placement.  Again, the remainder of the disease will be best addressed with open surgical therapy.  Along however I elected to terminate the procedure. The sheath was removed and StarClose closure device was deployed in the Right femoral artery with excellent hemostatic result. The patient was taken to the recovery room in stable condition having tolerated the procedure well.  Findings:               Aortogram:  This demonstrated normal renal arteries.  The right iliac artery had been previously stented and there was no significant stenosis in the right common or external iliac arteries.  The left common iliac artery was ectatic proximally but had a moderate stenosis in the 60% range.  The vessel normalized  for several centimeters and then in the mid external iliac artery was a high-grade stenosis in the 85 to 90% range.             Left Lower Extremity:  This demonstrated disease in the distal left common femoral artery with a high-grade stenosis at the origin of the profunda femoris artery in the 90% range.  The SFA had a flush occlusion and had been stented all the way up to the origin.  There were then occluded stents throughout the entirety of the SFA and down to the below-knee popliteal artery.  There were collaterals filling a posterior tibial artery in the proximal to mid calf and this was the only runoff distally but was a reasonably good vessel and did fill the foot   Disposition: Patient was taken to the recovery room in stable condition having tolerated the  procedure well.  Complications: None  [name] 07/06/2023 9:07 AM   This note was created with Dragon Medical transcription system. Any errors in dictation are purely unintentional.

## 2023-07-06 NOTE — Consult Note (Signed)
 [facility] CLINIC CARDIOLOGY CONSULT NOTE       Patient ID: [name] RITCHIE [name] MRN: [id number] DOB/AGE: May 23, 1955 68 y.o.  Admit date: 07/04/2023 Referring Physician Dr. Gillis Santa Primary Physician Okey Dupre, Skipper Cliche, FNP (Inactive)  Primary Cardiologist Dr. Juliann Pares Reason for Consultation POC  HPI: [name] BUSSEY [name] is a 68 y.o. male  with a past medical history of HFrEF (<20%, severely decreased LV function with inf/basal anteroseptal, basal inf and apex akinesis), CAD with hx STEMI 2005 s/p PCI, hx rectal cancer s/p XRT and chemo, hx ileostomy s/p takedown 08/2015 who presented to the ED on 07/04/2023 for toe pain and discoloration. Underwent angiography today which revealed severe PAD needing surgical intervention. Cardiology was consulted for preoperative cardiac evaluation.   Patient reports that he has been having issues with left leg/toe pain for the last few months.  He came to the ED for this and on angiography today was found to have significant left lower extremity PAD.  He is being set up for left lower extremity bypass with vascular surgery.  Notable lab work today includes creatinine 1.13, potassium 3.6, hemoglobin 10.7, WBC 9.9.  No EKG for review this admission.  Chest x-ray yesterday relatively unremarkable.  At the time of my evaluation this morning patient is resting comfortably in specials recovery following his angiography.  We discussed the findings in detail and the need for further surgical evaluation.  I explained to the patient that cardiology was consulted for preoperative evaluation.  He states that overall he has been doing quite well recently from a cardiac perspective.  He denies any episodes of chest pain, shortness of breath, palpitations, lower extremity edema.  States that he has been taking all of his medications as prescribed and tolerating these well without any issues.  He states that he is not able to do all the things he wishes due to his toe/leg pain.   When he is active he does not have any chest pain, shortness of breath, or other anginal symptoms.  Review of systems complete and found to be negative unless listed above    Past Medical History:  Diagnosis Date   Atherosclerosis    Cancer (HCC)    CHF (congestive heart failure) (HCC)    Chronic kidney disease    kidney stones, UTI   Colon cancer (HCC)    Coronary artery disease    Diabetes mellitus without complication (HCC)    GERD (gastroesophageal reflux disease)    Gout    Hematuria    Hyperlipidemia    Hypertension    Iron deficiency anemia    Myocardial infarction [facility]) 2005   Peripheral vascular disease (HCC)    Pulmonary nodules    Rectal cancer (HCC)    Ureter, stricture    Wears dentures    full upper    Past Surgical History:  Procedure Laterality Date   BOWEL RESECTION N/A 10/23/2014   Procedure: LOW ANTERIOR BOWEL RESECTION;  Surgeon: Natale Lay, MD;  Location: [facility] ORS;  Service: General;  Laterality: N/A;   COLON SURGERY     COLONOSCOPY 06/06/14     COLONOSCOPY WITH ESOPHAGOGASTRODUODENOSCOPY (EGD)     CORONARY ANGIOPLASTY WITH STENT PLACEMENT  03/2004   CYSTOSCOPY WITH STENT PLACEMENT Bilateral 10/23/2014   Procedure: CYSTOSCOPY WITH STENT PLACEMENT,URETHRAL DILATION, LEFT RETROGRADE PYELOGRAM, URETEROSCOPY;  Surgeon: Vanna Scotland, MD;  Location: [facility] ORS;  Service: Urology;  Laterality: Bilateral;   DIVERTING ILEOSTOMY N/A 10/23/2014   Procedure: DIVERTING ILEOSTOMY;  Surgeon: Natale Lay,  MD;  Location: [facility] OR[facility];  Service: General;  Laterality: N/A;   ILEOSTOMY CLOSURE N/A 09/08/2015   Procedure: ILEOSTOMY TAKEDOWN;  Surgeon: Ricarda Frame, MD;  Location: [facility] OR[facility];  Service: General;  Laterality: N/A;   LAPAROTOMY N/A 10/23/2014   Procedure: EXPLORATORY LAPAROTOMY;  Surgeon: Natale Lay, MD;  Location: [facility] OR[facility];  Service: General;  Laterality: N/A;   PERIPHERAL VASCULAR CATHETERIZATION N/A 05/12/2015   Procedure: Abdominal Aortogram w/Lower Extremity;  Surgeon:  Annice Needy, MD;  Location: [facility];  Service: Cardiovascular;  Laterality: N/A;   PERIPHERAL VASCULAR CATHETERIZATION  05/12/2015   Procedure: Lower Extremity Intervention;  Surgeon: Annice Needy, MD;  Location: [facility];  Service: Cardiovascular;;   PERIPHERAL VASCULAR CATHETERIZATION N/A 06/30/2015   Procedure: Abdominal Aortogram w/Lower Extremity;  Surgeon: Annice Needy, MD;  Location: [facility];  Service: Cardiovascular;  Laterality: N/A;   PERIPHERAL VASCULAR CATHETERIZATION  06/30/2015   Procedure: Lower Extremity Intervention;  Surgeon: Annice Needy, MD;  Location: [facility];  Service: Cardiovascular;;   PORT-A-CATH REMOVAL Right 09/08/2015   Procedure: REMOVAL PORT-A-CATH;  Surgeon: Ricarda Frame, MD;  Location: [facility] OR[facility];  Service: General;  Laterality: Right;   PORTACATH PLACEMENT  07/01/2014   Dr. Egbert Garibaldi    Medications Prior to Admission  Medication Sig Dispense Refill Last Dose/Taking   acetaminophen (TYLENOL) 500 MG tablet Take 500 mg by mouth every 6 (six) hours as needed.   Taking As Needed   Ascorbic Acid (VITAMIN C) 1000 MG tablet Take 1,000 mg by mouth daily. Reported on 09/26/2015   07/04/2023   carvedilol (COREG) 3.125 MG tablet Take 3.125 mg by mouth 2 (two) times daily with a meal.   07/04/2023 Morning   colchicine 0.6 MG tablet Take 0.6 mg by mouth daily.   07/04/2023 Morning   furosemide (LASIX) 40 MG tablet Take 1 tablet (40 mg total) by mouth daily. 30 tablet 0 07/04/2023 Morning   spironolactone (ALDACTONE) 25 MG tablet Take 0.5 tablets (12.5 mg total) by mouth daily. (Patient taking differently: Take 12.5 mg by mouth 2 (two) times daily.) 30 tablet 0 07/04/2023 Morning   cloNIDine (CATAPRES) 0.2 MG tablet Take 0.2 mg by mouth 2 (two) times daily. (Patient not taking: Reported on 07/04/2023)   Not Taking   Ferrous Sulfate (IRON) 325 (65 Fe) MG TABS Take 1 tablet by mouth every other day. (Patient not taking: Reported on 05/18/2022)   Not  Taking   losartan (COZAAR) 25 MG tablet Take 25 mg by mouth daily. On hold since hospitalization (Patient not taking: Reported on 05/18/2022)   Not Taking   pravastatin (PRAVACHOL) 20 MG tablet Take 20 mg by mouth daily. (Patient not taking: Reported on 05/18/2022)   Not Taking   Social History   Socioeconomic History   Marital status: Married    Spouse name: Not on file   Number of children: Not on file   Years of education: Not on file   Highest education level: Not on file  Occupational History   Not on file  Tobacco Use   Smoking status: Former    Current packs/day: 0.00    Types: Cigarettes    Quit date: 11/19/1984    Years since quitting: 38.6   Smokeless tobacco: Never   Tobacco comments:    smoked for brief period in his teens  Vaping Use   Vaping status: Never Used  Substance and Sexual Activity   Alcohol use: No  Alcohol/week: 0.0 standard drinks of alcohol   Drug use: No   Sexual activity: Not Currently  Other Topics Concern   Not on file  Social History Narrative   Not on file   Social Drivers of Health   Financial Resource Strain: Not on file  Food Insecurity: No Food Insecurity (07/04/2023)   Hunger Vital Sign    Worried About Running Out of Food in the Last Year: Never true    Ran Out of Food in the Last Year: Never true  Transportation Needs: No Transportation Needs (07/04/2023)   PRAPARE - Administrator, Civil Service (Medical): No    Lack of Transportation (Non-Medical): No  Physical Activity: Not on file  Stress: Not on file  Social Connections: Moderately Integrated (07/04/2023)   Social Connection and Isolation Panel [NHANES]    Frequency of Communication with Friends and Family: Three times a week    Frequency of Social Gatherings with Friends and Family: More than three times a week    Attends Religious Services: More than 4 times per year    Active Member of Clubs or Organizations: No    Attends Banker Meetings: Never     Marital Status: Married  Catering manager Violence: Not At Risk (07/04/2023)   Humiliation, Afraid, Rape, and Kick questionnaire    Fear of Current or Ex-Partner: No    Emotionally Abused: No    Physically Abused: No    Sexually Abused: No    Family History  Problem Relation Age of Onset   Hypertension Brother    Hypertension Father    Diabetes Father    Diabetes Brother    Hypertension Brother    Heart disease Paternal Grandfather      Vitals:   07/06/23 0931 07/06/23 0945 07/06/23 0956 07/06/23 1000  BP: 138/74 137/76  139/79  Pulse: 66 65 64 67  Resp: 19 (!) 24 (!) 24 (!) 21  Temp:      TempSrc:      SpO2: 96% 96% 96% 96%  Weight:      Height:        PHYSICAL EXAM General: Chronically ill-appearing male, well nourished, in no acute distress. HEENT: Normocephalic and atraumatic. Neck: No JVD.  Lungs: Normal respiratory effort on room air. Clear bilaterally to auscultation. No wheezes, crackles, rhonchi.  Heart: HRRR. Normal S1 and S2 without gallops or murmurs.  Abdomen: Non-distended appearing.  Msk: Normal strength and tone for age. Extremities: Warm and well perfused. No clubbing, cyanosis.  No edema.  Neuro: Alert and oriented X 3. Psych: Answers questions appropriately.   Labs: Basic Metabolic Panel: Recent Labs    07/05/23 0434 07/06/23 0218  NA 139 135  K 3.8 3.6  CL 104 103  CO2 23 20*  GLUCOSE 94 112*  BUN 26* 30*  CREATININE 1.40* 1.31*  CALCIUM 9.3 9.1  MG  --  1.9  PHOS  --  2.9   Liver Function Tests: Recent Labs    07/04/23 1318  AST 14*  ALT 15  ALKPHOS 97  BILITOT 0.8  PROT 7.3  ALBUMIN 3.7   No results for input(s): "LIPASE", "AMYLASE" in the last 72 hours. CBC: Recent Labs    07/04/23 1318 07/05/23 0434 07/06/23 0218  WBC 10.6* 8.5 9.9  NEUTROABS 8.7*  --   --   HGB 10.8* 11.1* 10.7*  HCT 35.0* 35.4* 33.7*  MCV 82.4 81.2 82.2  PLT 335 360 338   Cardiac Enzymes: No results  for input(s): "CKTOTAL", "CKMB",  "CKMBINDEX", "TROPONINIHS" in the last 72 hours. BNP: No results for input(s): "BNP" in the last 72 hours. D-Dimer: No results for input(s): "DDIMER" in the last 72 hours. Hemoglobin A1C: Recent Labs    07/05/23 0434  HGBA1C 6.3*   Fasting Lipid Panel: No results for input(s): "CHOL", "HDL", "LDLCALC", "TRIG", "CHOLHDL", "LDLDIRECT" in the last 72 hours. Thyroid Function Tests: No results for input(s): "TSH", "T4TOTAL", "T3FREE", "THYROIDAB" in the last 72 hours.  Invalid input(s): "FREET3" Anemia Panel: No results for input(s): "VITAMINB12", "FOLATE", "FERRITIN", "TIBC", "IRON", "RETICCTPCT" in the last 72 hours.   Radiology: PERIPHERAL VASCULAR CATHETERIZATION Result Date: 07/06/2023 See surgical note for result.  DG Chest Port 1 View Result Date: 07/05/2023 CLINICAL DATA:  Pleural effusion EXAM: PORTABLE CHEST 1 VIEW COMPARISON:  05/05/2022 FINDINGS: Heart borderline in size. Mediastinal contours within normal limits. Lungs clear. No effusions or acute bony abnormality. IMPRESSION: No active disease. Electronically Signed   By: Charlett Nose M.D.   On: 07/05/2023 20:26   US ARTERIAL ABI (SCREENING LOWER EXTREMITY) Result Date: 07/05/2023 CLINICAL DATA:  Per vascular disease, diabetes and gangrene with osteomyelitis of the left foot. EXAM: NONINVASIVE PHYSIOLOGIC VASCULAR STUDY OF BILATERAL LOWER EXTREMITIES TECHNIQUE: Evaluation of both lower extremities were performed at rest, including calculation of ankle-brachial indices with single level Doppler, pressure and pulse volume recording. COMPARISON:  None Available. FINDINGS: Right ABI:  0.53 Left ABI:  No detectable flow to calculate ABI. Right Lower Extremity: Monophasic posterior tibial waveform. No detectable flow in the dorsalis pedis artery. Left Lower Extremity: No detectable flow in either the posterior tibial or dorsalis pedis artery distally. 0.5-0.79 Moderate PAD IMPRESSION: 1. No detectable flow in the left posterior tibial  or dorsalis pedis arteries distally. ABI therefore cannot be calculated. 2. Moderately depressed resting ankle-brachial index of 0.53 on the right. Monophasic right posterior tibial artery waveform. No flow detectable in the right dorsalis pedis artery. Electronically Signed   By: Irish Lack M.D.   On: 07/05/2023 12:27   DG Foot Complete Left Result Date: 07/04/2023 CLINICAL DATA:  Necrotic tissue with toes. EXAM: LEFT FOOT - COMPLETE 3+ VIEW COMPARISON:  None Available. FINDINGS: There is near complete erosion of the head of the second metatarsal. There is erosion the head of the third metatarsal along the medial border. Erosion of bone at the base of the second and third proximal phalanges. Bite likely erosions in the head of the first metatarsal. IMPRESSION: Clear osteomyelitis at the heads of the second and third metatarsals and adjacent proximal phalanges. Probable osteomyelitis head of the first metatarsal versus aggressive arthropathy Electronically Signed   By: Genevive Bi M.D.   On: 07/04/2023 15:40    ECHO as above  TELEMETRY reviewed by me 07/06/2023: Not on telemetry  EKG reviewed by me: sinus rhythm rate 69 bpm  Data reviewed by me 07/06/2023: last 24h vitals tele labs imaging I/O ED provider note, admission H&P  Principal Problem:   Foot osteomyelitis, left (HCC) Active Problems:   CAD (coronary artery disease)   HTN (hypertension)   HLD (hyperlipidemia)   Atherosclerotic peripheral vascular disease with intermittent claudication (HCC)   Iron deficiency anemia   Diabetes mellitus (HCC)   H/O ileostomy   History of rectal cancer   Cardiomyopathy (HCC)   Heart failure with reduced ejection fraction (HCC)    ASSESSMENT AND PLAN:  [name] TAILOR [name] is a 68 y.o. male  with a past medical history of HFrEF (<20%, severely  decreased LV function with inf/basal anteroseptal, basal inf and apex akinesis), CAD with hx STEMI 2005 s/p PCI, hx rectal cancer s/p XRT and chemo,  hx ileostomy s/p takedown 08/2015 who presented to the ED on 07/04/2023 for toe pain and discoloration. Underwent angiography today which revealed severe PAD needing surgical intervention. Cardiology was consulted for preoperative cardiac evaluation.   # Pre-operative cardiac evaluation # Peripheral vascular disease # Chronic HFrEF # Coronary artery disease # Chronic kidney disease stage III Patient presented to the ED for toe pain and discoloration. Angiography today with severe LLE PAD, needs bypass. Cardiology consulted for POC. No complaints of new or recent CP, SOB, anginal symptoms. -Echo this admission with EF 20%, rWMAs relatively stable from prior.  -No plan for further cardiac workup at this time. -Patient optimized for surgery from cardiac perspective. No complaints of chest pain, SOB, anginal symptoms. Recent functional limitations 2/2 leg/toe pain.  Recommend continuing carvedilol, pravastatin, aspirin perioperatively. -Patient is at elevated but acceptable risk for proceeding with surgery understanding risk/benefits.  Cardiology will sign off. Please haiku with questions or re-engage if needed.    This patient's plan of care was discussed and created with Dr. Darrold Junker and he is in agreement.  Signed: Gale Journey, PA-C  07/06/2023, 10:14 AM [facility] Cardiology

## 2023-07-06 NOTE — Progress Notes (Signed)
 ORTHOPAEDIC progress note  REQUESTING PHYSICIAN: Gillis Santa, MD  Chief Complaint: Gangrene left foot  HPI: [name] JUMONVILLE [name] is a 68 y.o. male who presents today resting in bed comfortably.  Patient underwent angiogram today with vascular.  Flow still does not appear to be good to the left foot so they are planning on performing a bypass surgery at some point early next week.  Patient currently denies any pain to the left foot.  Past Medical History:  Diagnosis Date   Atherosclerosis    Cancer (HCC)    CHF (congestive heart failure) (HCC)    Chronic kidney disease    kidney stones, UTI   Colon cancer (HCC)    Coronary artery disease    Diabetes mellitus without complication (HCC)    GERD (gastroesophageal reflux disease)    Gout    Hematuria    Hyperlipidemia    Hypertension    Iron deficiency anemia    Myocardial infarction [facility] 2005   Peripheral vascular disease (HCC)    Pulmonary nodules    Rectal cancer (HCC)    Ureter, stricture    Wears dentures    full upper   Past Surgical History:  Procedure Laterality Date   BOWEL RESECTION N/A 10/23/2014   Procedure: LOW ANTERIOR BOWEL RESECTION;  Surgeon: Natale Lay, MD;  Location: [facility];  Service: General;  Laterality: N/A;   COLON SURGERY     COLONOSCOPY 06/06/14     COLONOSCOPY WITH ESOPHAGOGASTRODUODENOSCOPY (EGD)     CORONARY ANGIOPLASTY WITH STENT PLACEMENT  03/2004   CYSTOSCOPY WITH STENT PLACEMENT Bilateral 10/23/2014   Procedure: CYSTOSCOPY WITH STENT PLACEMENT,URETHRAL DILATION, LEFT RETROGRADE PYELOGRAM, URETEROSCOPY;  Surgeon: Vanna Scotland, MD;  Location: [facility];  Service: Urology;  Laterality: Bilateral;   DIVERTING ILEOSTOMY N/A 10/23/2014   Procedure: DIVERTING ILEOSTOMY;  Surgeon: Natale Lay, MD;  Location: [facility];  Service: General;  Laterality: N/A;   ILEOSTOMY CLOSURE N/A 09/08/2015   Procedure: ILEOSTOMY TAKEDOWN;  Surgeon: Ricarda Frame, MD;  Location: [facility];  Service: General;  Laterality:  N/A;   LAPAROTOMY N/A 10/23/2014   Procedure: EXPLORATORY LAPAROTOMY;  Surgeon: Natale Lay, MD;  Location: [facility];  Service: General;  Laterality: N/A;   LOWER EXTREMITY ANGIOGRAPHY Left 07/06/2023   Procedure: Lower Extremity Angiography;  Surgeon: Annice Needy, MD;  Location: [facility];  Service: Cardiovascular;  Laterality: Left;   PERIPHERAL VASCULAR CATHETERIZATION N/A 05/12/2015   Procedure: Abdominal Aortogram w/Lower Extremity;  Surgeon: Annice Needy, MD;  Location: [facility];  Service: Cardiovascular;  Laterality: N/A;   PERIPHERAL VASCULAR CATHETERIZATION  05/12/2015   Procedure: Lower Extremity Intervention;  Surgeon: Annice Needy, MD;  Location: [facility];  Service: Cardiovascular;;   PERIPHERAL VASCULAR CATHETERIZATION N/A 06/30/2015   Procedure: Abdominal Aortogram w/Lower Extremity;  Surgeon: Annice Needy, MD;  Location: [facility];  Service: Cardiovascular;  Laterality: N/A;   PERIPHERAL VASCULAR CATHETERIZATION  06/30/2015   Procedure: Lower Extremity Intervention;  Surgeon: Annice Needy, MD;  Location: [facility];  Service: Cardiovascular;;   PORT-A-CATH REMOVAL Right 09/08/2015   Procedure: REMOVAL PORT-A-CATH;  Surgeon: Ricarda Frame, MD;  Location: [facility];  Service: General;  Laterality: Right;   PORTACATH PLACEMENT  07/01/2014   Dr. Egbert Garibaldi   Social History   Socioeconomic History   Marital status: Married    Spouse name: Not on file   Number of children: Not on file   Years of education:  Not on file   Highest education level: Not on file  Occupational History   Not on file  Tobacco Use   Smoking status: Former    Current packs/day: 0.00    Types: Cigarettes    Quit date: 11/19/1984    Years since quitting: 38.6   Smokeless tobacco: Never   Tobacco comments:    smoked for brief period in his teens  Vaping Use   Vaping status: Never Used  Substance and Sexual Activity   Alcohol use: No    Alcohol/week: 0.0 standard  drinks of alcohol   Drug use: No   Sexual activity: Not Currently  Other Topics Concern   Not on file  Social History Narrative   Not on file   Social Drivers of Health   Financial Resource Strain: Not on file  Food Insecurity: No Food Insecurity (07/04/2023)   Hunger Vital Sign    Worried About Running Out of Food in the Last Year: Never true    Ran Out of Food in the Last Year: Never true  Transportation Needs: No Transportation Needs (07/04/2023)   PRAPARE - Administrator, Civil Service (Medical): No    Lack of Transportation (Non-Medical): No  Physical Activity: Not on file  Stress: Not on file  Social Connections: Moderately Integrated (07/04/2023)   Social Connection and Isolation Panel [NHANES]    Frequency of Communication with Friends and Family: Three times a week    Frequency of Social Gatherings with Friends and Family: More than three times a week    Attends Religious Services: More than 4 times per year    Active Member of [employer] or Organizations: No    Attends Engineer, structural: Never    Marital Status: Married   Family History  Problem Relation Age of Onset   Hypertension Brother    Hypertension Father    Diabetes Father    Diabetes Brother    Hypertension Brother    Heart disease Paternal Grandfather    Allergies  Allergen Reactions   Entresto [Sacubitril-Valsartan] Cough   Lisinopril Cough   Prior to Admission medications   Medication Sig Start Date End Date Taking? Authorizing Provider  acetaminophen (TYLENOL) 500 MG tablet Take 500 mg by mouth every 6 (six) hours as needed.   Yes [provider]  Ascorbic Acid (VITAMIN C) 1000 MG tablet Take 1,000 mg by mouth daily. Reported on 09/26/2015   Yes [provider]  carvedilol (COREG) 3.125 MG tablet Take 3.125 mg by mouth 2 (two) times daily with a meal.   Yes [provider]  colchicine 0.6 MG tablet Take 0.6 mg by mouth daily.   Yes [provider]  furosemide (LASIX) 40 MG tablet Take 1 tablet (40 mg total) by mouth daily. 05/14/22  Yes Baron Hamper, MD  spironolactone (ALDACTONE) 25 MG tablet Take 0.5 tablets (12.5 mg total) by mouth daily. Patient taking differently: Take 12.5 mg by mouth 2 (two) times daily. 05/13/22  Yes Baron Hamper, MD  cloNIDine (CATAPRES) 0.2 MG tablet Take 0.2 mg by mouth 2 (two) times daily. Patient not taking: Reported on 07/04/2023    [provider]  Ferrous Sulfate (IRON) 325 (65 Fe) MG TABS Take 1 tablet by mouth every other day. Patient not taking: Reported on 05/18/2022 01/22/22   [provider]  losartan (COZAAR) 25 MG tablet Take 25 mg by mouth daily. On hold since hospitalization Patient not taking: Reported on 05/18/2022  [provider]  pravastatin (PRAVACHOL) 20 MG tablet Take 20 mg by mouth daily. Patient not taking: Reported on 05/18/2022    [provider]   ECHOCARDIOGRAM COMPLETE Result Date: 07/06/2023    ECHOCARDIOGRAM REPORT   Patient Name:   [name] SIEGEL [name] Date of Exam: 07/05/2023 Medical Rec #:  [id number]             Height:       68.0 in Accession #:    [id number]            Weight:       155.5 lb Date of Birth:  1955-10-01             BSA:          1.836 m Patient Age:    67 years              BP:           133/78 mmHg Patient Gender: M                     HR:           66 bpm. Exam Location:  [facility] Procedure: 2D Echo, Cardiac Doppler, Color Doppler and Intracardiac            Opacification Agent (Both Spectral and Color Flow Doppler were            utilized during procedure). Indications:     I50.21 Acute Systolic CHF  History:         Patient has prior history of Echocardiogram examinations, most                  recent 05/08/2022. CHF, CAD and Previous Myocardial Infarction;                  Risk Factors:Diabetes, Dyslipidemia and Hypertension.  Sonographer:     Daphine Deutscher RDCS Referring Phys:  [id number] Diagnosing Phys:  Mellody Drown Alluri IMPRESSIONS  1. Left ventricular ejection fraction, by estimation, is 20%. The left ventricle has severely decreased function. The left ventricle demonstrates regional wall motion abnormalities (see scoring diagram/findings for description). The left ventricular internal cavity size was mildly to moderately dilated. Left ventricular diastolic parameters are consistent with Grade I diastolic dysfunction (impaired relaxation).  2. Right ventricular systolic function is normal. The right ventricular size is normal.  3. Left atrial size was mildly dilated.  4. The mitral valve is normal in structure. Mild mitral valve regurgitation.  5. The aortic valve is normal in structure. Aortic valve regurgitation is not visualized. FINDINGS  Left Ventricle: Left ventricular ejection fraction, by estimation, is 20%. The left ventricle has severely decreased function. The left ventricle demonstrates regional wall motion abnormalities. Definity contrast agent was given IV to delineate the left  ventricular endocardial borders. The left ventricular internal cavity size was mildly to moderately dilated. There is no left ventricular hypertrophy. Left ventricular diastolic parameters are consistent with Grade I diastolic dysfunction (impaired relaxation).  LV Wall Scoring: The mid and distal anterior septum, entire apex, and mid inferoseptal segment are akinetic. The inferior wall, posterior wall, basal anteroseptal segment, mid anterolateral segment, mid anterior segment, and basal inferoseptal segment are hypokinetic. The basal anterolateral segment and basal anterior segment are normal. Right Ventricle: The right ventricular size is normal. No increase in right ventricular wall thickness. Right ventricular systolic function is normal. Left Atrium: Left atrial size was mildly dilated. Right Atrium: Right  atrial size was normal in size. Pericardium: There is no evidence of pericardial effusion. Mitral Valve: The mitral  valve is normal in structure. Mild mitral valve regurgitation. Tricuspid Valve: The tricuspid valve is not well visualized. Tricuspid valve regurgitation is trivial. Aortic Valve: The aortic valve is normal in structure. Aortic valve regurgitation is not visualized. Pulmonic Valve: The pulmonic valve was not well visualized. Pulmonic valve regurgitation is not visualized. Aorta: The aortic root is normal in size and structure. IAS/Shunts: The interatrial septum was not well visualized.  LEFT VENTRICLE PLAX 2D LVIDd:         5.40 cm   Diastology LVIDs:         4.70 cm   LV e' medial:    4.57 cm/s LV PW:         0.90 cm   LV E/e' medial:  8.0 LV IVS:        0.80 cm   LV e' lateral:   4.24 cm/s LVOT diam:     2.10 cm   LV E/e' lateral: 8.7 LV SV:         44 LV SV Index:   24 LVOT Area:     3.46 cm  RIGHT VENTRICLE RV Basal diam:  3.70 cm RV S prime:     11.60 cm/s TAPSE (M-mode): 2.2 cm LEFT ATRIUM             Index        RIGHT ATRIUM          Index LA diam:        4.00 cm 2.18 cm/m   RA Area:     9.39 cm LA Vol (A2C):   78.2 ml 42.58 ml/m  RA Volume:   17.20 ml 9.37 ml/m LA Vol (A4C):   65.1 ml 35.45 ml/m LA Biplane Vol: 72.8 ml 39.64 ml/m  AORTIC VALVE             PULMONIC VALVE LVOT Vmax:   66.57 cm/s  PV Vmax:       0.80 m/s LVOT Vmean:  41.933 cm/s PV Peak grad:  2.5 mmHg LVOT VTI:    0.126 m  AORTA Ao Root diam: 3.70 cm MITRAL VALVE MV Area (PHT): 2.89 cm    SHUNTS MV Decel Time: 263 msec    Systemic VTI:  0.13 m MV E velocity: 36.70 cm/s  Systemic Diam: 2.10 cm MV A velocity: 95.40 cm/s MV E/A ratio:  0.38 Windell Norfolk Electronically signed by Windell Norfolk Signature Date/Time: 07/06/2023/11:51:23 AM    Final    PERIPHERAL VASCULAR CATHETERIZATION Result Date: 07/06/2023 See surgical note for result.  DG Chest Port 1 View Result Date: 07/05/2023 CLINICAL DATA:  Pleural effusion EXAM: PORTABLE CHEST 1 VIEW COMPARISON:  05/05/2022 FINDINGS: Heart borderline in size. Mediastinal contours within  normal limits. Lungs clear. No effusions or acute bony abnormality. IMPRESSION: No active disease. Electronically Signed   By: Charlett Nose M.D.   On: 07/05/2023 20:26   US ARTERIAL ABI (SCREENING LOWER EXTREMITY) Result Date: 07/05/2023 CLINICAL DATA:  Per vascular disease, diabetes and gangrene with osteomyelitis of the left foot. EXAM: NONINVASIVE PHYSIOLOGIC VASCULAR STUDY OF BILATERAL LOWER EXTREMITIES TECHNIQUE: Evaluation of both lower extremities were performed at rest, including calculation of ankle-brachial indices with single level Doppler, pressure and pulse volume recording. COMPARISON:  None Available. FINDINGS: Right ABI:  0.53 Left ABI:  No detectable flow to calculate ABI. Right Lower Extremity: Monophasic posterior tibial waveform. No detectable flow in  the dorsalis pedis artery. Left Lower Extremity: No detectable flow in either the posterior tibial or dorsalis pedis artery distally. 0.5-0.79 Moderate PAD IMPRESSION: 1. No detectable flow in the left posterior tibial or dorsalis pedis arteries distally. ABI therefore cannot be calculated. 2. Moderately depressed resting ankle-brachial index of 0.53 on the right. Monophasic right posterior tibial artery waveform. No flow detectable in the right dorsalis pedis artery. Electronically Signed   By: Irish Lack M.D.   On: 07/05/2023 12:27   DG Foot Complete Left Result Date: 07/04/2023 CLINICAL DATA:  Necrotic tissue with toes. EXAM: LEFT FOOT - COMPLETE 3+ VIEW COMPARISON:  None Available. FINDINGS: There is near complete erosion of the head of the second metatarsal. There is erosion the head of the third metatarsal along the medial border. Erosion of bone at the base of the second and third proximal phalanges. Bite likely erosions in the head of the first metatarsal. IMPRESSION: Clear osteomyelitis at the heads of the second and third metatarsals and adjacent proximal phalanges. Probable osteomyelitis head of the first metatarsal versus  aggressive arthropathy Electronically Signed   By: Genevive Bi M.D.   On: 07/04/2023 15:40    Positive ROS: All other systems have been reviewed and were otherwise negative with the exception of those mentioned in the HPI and as above.  12 point ROS was performed.  Physical Exam: General: Alert and oriented.  No apparent distress.  Vascular:  Left foot:Dorsalis Pedis:  absent Posterior Tibial:  absent  Right foot: Dorsalis Pedis:  absent Posterior Tibial:  absent  Neuro:absent protective sensation  Derm: Right foot without ulceration or wound.  Severe necrosis of the left second third fourth and partially fifth toes.  Necrosis extends to the metatarsophalangeal joint region.  Mild surrounding erythema to the area.  Foul odor from the necrotic tissue at this time.  Ortho/MS: Diffuse edema to the left foot and ankle.       Assessment: Gangrene left forefoot Osteomyelitis left forefoot Diabetes with peripheral vascular disease  Plan: -Patient seen and examined -X-ray imaging reviewed and discussed in detail showing osteomyelitis to the left forefoot.  Also discussed gangrenous changes to the left forefoot. -Discussed concern the patient's blood flow is still not enough to heal transmetatarsal amputation.  Will await further vascular recommendations but they are planning for a bypass procedure to left lower extremity at some point next week. -Discussed with patient and family that we will plan on reevaluating after bypass surgery and plan for surgery consisting of transmetatarsal amputation and vascular thinks he has enough blood flow to heal the procedure. -Recommend for now Betadine wet to dry dressings daily.  Order placed in chart. -Patient may be weightbearing as tolerated at this time in a surgical shoe to the left foot.  After surgery patient will be expected to be nonweightbearing for likely 2 to 3 weeks with heel transfers only.  Rosetta Posner,  DPM   07/06/2023 12:46 PM

## 2023-07-07 ENCOUNTER — Telehealth (HOSPITAL_COMMUNITY): Payer: Self-pay | Admitting: Pharmacy Technician

## 2023-07-07 ENCOUNTER — Other Ambulatory Visit (HOSPITAL_COMMUNITY): Payer: Self-pay

## 2023-07-07 DIAGNOSIS — M869 Osteomyelitis, unspecified: Secondary | ICD-10-CM | POA: Diagnosis not present

## 2023-07-07 LAB — BASIC METABOLIC PANEL
Anion gap: 12 (ref 5–15)
BUN: 26 mg/dL — ABNORMAL HIGH (ref 8–23)
CO2: 22 mmol/L (ref 22–32)
Calcium: 9.1 mg/dL (ref 8.9–10.3)
Chloride: 106 mmol/L (ref 98–111)
Creatinine, Ser: 1.26 mg/dL — ABNORMAL HIGH (ref 0.61–1.24)
GFR, Estimated: >60 mL/min (ref >60)
Glucose, Bld: 98 mg/dL (ref 70–99)
Potassium: 3.2 mmol/L — ABNORMAL LOW (ref 3.5–5.1)
Sodium: 140 mmol/L (ref 135–145)

## 2023-07-07 LAB — GLUCOSE, CAPILLARY
Glucose-Capillary: 123 mg/dL — ABNORMAL HIGH (ref 70–99)
Glucose-Capillary: 108 mg/dL — ABNORMAL HIGH (ref 70–99)
Glucose-Capillary: 114 mg/dL — ABNORMAL HIGH (ref 70–99)
Glucose-Capillary: 100 mg/dL — ABNORMAL HIGH (ref 70–99)

## 2023-07-07 LAB — CBC
HCT: 31.9 % — ABNORMAL LOW (ref 39.0–52.0)
Hemoglobin: 10.3 g/dL — ABNORMAL LOW (ref 13.0–17.0)
MCH: 25.8 pg — ABNORMAL LOW (ref 26.0–34.0)
MCHC: 32.3 g/dL (ref 30.0–36.0)
MCV: 79.9 fL — ABNORMAL LOW (ref 80.0–100.0)
Platelets: 294 10*3/uL (ref 150–400)
RBC: 3.99 MIL/uL — ABNORMAL LOW (ref 4.22–5.81)
RDW: 14.2 % (ref 11.5–15.5)
WBC: 8.5 10*3/uL (ref 4.0–10.5)
nRBC: 0 % (ref 0.0–0.2)

## 2023-07-07 LAB — MAGNESIUM: Magnesium: 1.9 mg/dL (ref 1.7–2.4)

## 2023-07-07 LAB — PHOSPHORUS: Phosphorus: 3 mg/dL (ref 2.5–4.6)

## 2023-07-07 MED ORDER — POTASSIUM CHLORIDE CRYS ER 20 MEQ PO TBCR
40.0000 meq | EXTENDED_RELEASE_TABLET | Freq: Once | ORAL | Status: AC
  Administered 2023-07-07: 40 meq via ORAL
  Filled 2023-07-07: qty 2

## 2023-07-07 MED ORDER — ENSURE ENLIVE PO LIQD
237.0000 mL | Freq: Two times a day (BID) | ORAL | Status: DC
  Administered 2023-07-08 – 2023-07-22 (×18): 237 mL via ORAL

## 2023-07-07 MED ORDER — POTASSIUM CHLORIDE CRYS ER 20 MEQ PO TBCR
20.0000 meq | EXTENDED_RELEASE_TABLET | Freq: Once | ORAL | Status: AC
  Administered 2023-07-07: 20 meq via ORAL
  Filled 2023-07-07: qty 1

## 2023-07-07 MED ORDER — OXYCODONE HCL 5 MG PO TABS
5.0000 mg | ORAL_TABLET | Freq: Four times a day (QID) | ORAL | Status: DC | PRN
  Administered 2023-07-09 – 2023-07-11 (×4): 5 mg via ORAL
  Filled 2023-07-07 (×4): qty 1

## 2023-07-07 NOTE — Discharge Instructions (Signed)
 It is recommended that you have a PCP  Some PCP options in [place] area- This is  not a comprehensive list  [facility]- [phone] [facility]- [phone] [facility]- [phone] [facility]- [phone] [facility]- [phone] [facility]- [phone]  or [facility] Physician Referral Line [phone]   We have listed that you see  Lorn Junes, FNP General Medicine

## 2023-07-07 NOTE — Progress Notes (Signed)
 [facility] Progress Note  Patient: [name] [name]    ZOX:[id number]  DOA: 07/04/2023     Date of Service: the patient was seen and examined on 07/07/2023  Chief Complaint  Patient presents with   Wound Infection   Brief hospital course: [name] [name], [name] is a 68 year old male with history of non-insulin-dependent diabetes mellitus, has since stopped taking metformin, hypertension, hyperlipidemia, who presents emergency department for chief concerns of black toes on the left foot. He reports that he has noticed his toes bothering him and hurting with ambulation for about 1.5 months.  Denies any pain at rest, it hurts only when he is ambulating or putting weight on his left foot.  Vitals in the ED showed temperature of 99, respiration rate of 15, heart rate 70, blood pressure 129/80, SpO2 97% on room air.   Serum sodium is 141, potassium 3.9, chloride 107, bicarb 23, BUN of 24, serum creatinine of 1.26, EGFR greater than 60, nonfasting blood glucose 128, WBC 10.6, hemoglobin 10.8, platelets of 334.   Lactic acid is 0.9.   ED treatment: Zosyn, vancomycin.   Assessment and Plan:  # Foot osteomyelitis, left  Blood cultures x 2 are in process per EDP S/p vancomycin and cefepime, changed antibiotics to Unasyn and Zyvox Pharmacy following for antibiotics Podiatry consulted, pt will need left TMA after LLE bypass surgery next week   Severe PAD of lower extremities ABI: 1. No detectable flow in the left posterior tibial or dorsalis pedis arteries distally. ABI therefore cannot be calculated. 2. Moderately depressed resting ankle-brachial index of 0.53 on the right. Monophasic right posterior tibial artery waveform. No flow detectable in the right dorsalis pedis artery.  Vascular surgery consulted, s/p angiogram done on 3/19, found to have severe atherosclerosis show vascular surgery recommended left lower extremity bypass surgery after cardiology clearance. Cardiology  consulted for clearance, patient has been cleared at elevated risk by cardiology, no further testing needed.   Vascular surgery planning to schedule surgery on Monday 07/11/2023   Cardiomyopathy Chronic systolic CHF, compensated. Patient had complete echo on 05/08/2022: With estimated ejection fraction < 20% Does not appear to be in acute exacerbation at this time Home carvedilol 3.125 mg p.o. twice daily with meals,  Held Lasix and spironolactone home meds due to elevated Cr 3/19 creatinine 1.31, bicarb 20, started oral bicarbonate Strict I's and O's Monitor renal functions and urine output CXR: No active disease Follow repeat 2D echocardiogram Follow cardiology for further recommendation and clearance for LLE bypass surgery.   Diabetes mellitus (HCC) HbA1c 6.3, well-controlled Patient has stopped taking metformin per outpatient provider Continue NovoLog sliding scale Monitor CBG, continue diabetic diet  History of rectal cancer Continue outpatient follow-up with hematology/oncology   HTN (hypertension) Home carvedilol 3.125 mg p.o. twice daily with meals resumed on admission Use hydralazine as needed Monitor BP and titrate medications accordingly  Anemia hemoglobin 10.7 on 3/19 Follow anemia workup  Body mass index is 23.64 kg/m.  Interventions:  Diet: Heart healthy diet DVT Prophylaxis: Subcutaneous Heparin    Advance goals of care discussion: Full code  Family Communication: family was not present at bedside, at the time of interview.  The pt provided permission to discuss medical plan with the family. Opportunity was given to ask question and all questions were answered satisfactorily.   Disposition:  Pt is from Home, admitted with left toe osteomyelitis, on IV antibiotics, s/p angiogram done on 3/19, needs LLE Bypass Sx scheduled on 3/24 and then he  will need Lt Foot TMA next week, which precludes a safe discharge. Discharge to home, when stable and cleared by  vascular surgery and podiatry.  Subjective: No significant events overnight, c/o significant pain in the left foot, patient was advised to take Tylenol and oxycodone as needed for pain control.  Patient denied any chest pain or palpitations, no shortness of breath, no any other complaints.   Physical Exam: General: NAD, lying comfortably Appear in no distress, affect appropriate Eyes: PERRLA ENT: Oral Mucosa Clear, moist  Neck: no JVD,  Cardiovascular: S1 and S2 Present, no Murmur,  Respiratory: good respiratory effort, Bilateral Air entry equal and Decreased, no Crackles, no wheezes Abdomen: Bowel Sound present, Soft and no tenderness,  Skin: no rashes Extremities: no Pedal edema, no calf tenderness, left foot toe erythematous, tender and foul-smelling, blackish discoloration noticed, Neurologic: without any new focal findings Gait not checked due to patient safety concerns  Vitals:   07/06/23 2151 07/07/23 0440 07/07/23 0850 07/07/23 1556  BP: 138/77 122/72 114/70 112/67  Pulse: 70 80 67 65  Resp: 20 20 16 17   Temp: 99 F (37.2 C) 98.5 F (36.9 C) 97.8 F (36.6 C) 98.4 F (36.9 C)  TempSrc: Oral Oral  Oral  SpO2: 100% 98% 100% 100%  Weight:      Height:        Intake/Output Summary (Last 24 hours) at 07/07/2023 1845 Last data filed at 07/07/2023 1531 Gross per 24 hour  Intake 1533.15 ml  Output 1150 ml  Net 383.15 ml   Filed Weights   07/04/23 1313 07/04/23 2236  Weight: 68 kg 70.5 kg    Data Reviewed: I have personally reviewed and interpreted daily labs, tele strips, imagings as discussed above. I reviewed all nursing notes, pharmacy notes, vitals, pertinent old records I have discussed plan of care as described above with RN and patient/family.  CBC: Recent Labs  Lab 07/04/23 1318 07/05/23 0434 07/06/23 0218 07/07/23 0554  WBC 10.6* 8.5 9.9 8.5  NEUTROABS 8.7*  --   --   --   HGB 10.8* 11.1* 10.7* 10.3*  HCT 35.0* 35.4* 33.7* 31.9*  MCV 82.4 81.2 82.2  79.9*  PLT 335 360 338 294   Basic Metabolic Panel: Recent Labs  Lab 07/04/23 1318 07/05/23 0434 07/06/23 0218 07/07/23 0554  NA 141 139 135 140  K 3.9 3.8 3.6 3.2*  CL 107 104 103 106  CO2 23 23 20* 22  GLUCOSE 128* 94 112* 98  BUN 24* 26* 30* 26*  CREATININE 1.26* 1.40* 1.31* 1.26*  CALCIUM 9.5 9.3 9.1 9.1  MG  --   --  1.9 1.9  PHOS  --   --  2.9 3.0    Studies: No results found.   Scheduled Meds:  vitamin C  1,000 mg Oral Daily   aspirin EC  81 mg Oral Daily   carvedilol  3.125 mg Oral BID WC   [START ON 07/08/2023] feeding supplement  237 mL Oral BID BM   heparin  5,000 Units Subcutaneous Q8H   insulin aspart  0-5 Units Subcutaneous QHS   insulin aspart  0-9 Units Subcutaneous TID WC   pravastatin  20 mg Oral Daily   spironolactone  12.5 mg Oral Daily   Continuous Infusions:  ampicillin-sulbactam (UNASYN) IV Stopped (07/07/23 1531)   linezolid (ZYVOX) IV Stopped (07/07/23 1136)   PRN Meds: acetaminophen **OR** acetaminophen, hydrALAZINE, ondansetron **OR** ondansetron (ZOFRAN) IV, oxyCODONE, senna-docusate  Time spent: 40 minutes  Author: Gillis Santa.  MD [facility] 07/07/2023 6:45 PM  To reach On-call, see care teams to locate the attending and reach out to them via [email or web address]. If 7PM-7AM, please contact night-coverage If you still have difficulty reaching the attending provider, please page the [facility] (Director on Call) for [facility] on amion for assistance.

## 2023-07-07 NOTE — Telephone Encounter (Signed)
 Patient Product/process development scientist completed.    The patient is insured through [employer]. Patient has Medicare and is not eligible for a copay card, but may be able to apply for patient assistance or Medicare RX Payment Plan (Patient Must reach out to their plan, if eligible for payment plan), if available.    Ran test claim for Jardiance 10 mg and the current 30 day co-pay is $286.30 due to a $250.00 deductible. Will be $47.00 once deductible is met.  Ran test claim for Farxiga 10 mg and the current 30 day co-pay is $385.43 due to a $250.00 deductible.   This test claim was processed through [facility]- copay amounts may vary at other pharmacies due to pharmacy/plan contracts, or as the patient moves through the different stages of their insurance plan.     [name] [name], CPHT Pharmacy Technician III Certified Patient Advocate [facility] Patient Advocate Team Direct Number: [phone]  Fax: [phone]

## 2023-07-07 NOTE — Care Management Important Message (Signed)
 Important Message  Patient Details  Name: [name] RUPPE [name] MRN: [id number] Date of Birth: [birth year]   Important Message Given:  Yes - Medicare IM     Cristela Blue, CMA 07/07/2023, 12:02 PM

## 2023-07-07 NOTE — Progress Notes (Signed)
 Progress Note    07/07/2023 2:06 PM 1 Day Post-Op  Subjective: [name] [name] is a 68 year old male now postop day #1 from an aortogram with selective left lower extremity angiogram.  Patient was noted to have stent placement to the left external iliac artery and a stent placed to the left common iliac artery.  Patient is resting comfortably in bed this morning.  Patient's right groin he endorses to be sore but otherwise tolerable.  No complaints overnight.  Vitals all remained stable.   Vitals:   07/07/23 0440 07/07/23 0850  BP: 122/72 114/70  Pulse: 80 67  Resp: 20 16  Temp: 98.5 F (36.9 C) 97.8 F (36.6 C)  SpO2: 98% 100%   Physical Exam: Cardiac:  RRR, normal S1 and S2.  No murmurs appreciated. Lungs: Lungs clear on auscultation throughout.  No rales rhonchi or wheezing. Incisions: Right groin puncture with dressing clean dry and intact.  No hematoma seroma or infection to note. Extremities: Right lower extremity is warm to touch with palpable DP and PT pulses.  Left lower extremity unable to palpate DP and PT pulses but left lower extremity feels warmer today. Abdomen: Positive bowel sounds throughout, soft, nontender nondistended. Neurologic: AAOX3, answers all questions and follows commands appropriately.  CBC    Component Value Date/Time   WBC 8.5 07/07/2023 0554   RBC 3.99 (L) 07/07/2023 0554   HGB 10.3 (L) 07/07/2023 0554   HGB 11.7 (L) 08/12/2014 0858   HCT 31.9 (L) 07/07/2023 0554   HCT 37.1 (L) 08/12/2014 0858   PLT 294 07/07/2023 0554   PLT 282 08/12/2014 0858   MCV 79.9 (L) 07/07/2023 0554   MCV 72 (L) 08/12/2014 0858   MCH 25.8 (L) 07/07/2023 0554   MCHC 32.3 07/07/2023 0554   RDW 14.2 07/07/2023 0554   RDW 20.6 (H) 08/12/2014 0858   LYMPHSABS 0.8 07/04/2023 1318   LYMPHSABS 0.3 (L) 08/12/2014 0858   MONOABS 0.8 07/04/2023 1318   MONOABS 0.7 08/12/2014 0858   EOSABS 0.2 07/04/2023 1318   EOSABS 0.2 08/12/2014 0858   BASOSABS 0.0 07/04/2023 1318    BASOSABS 0.0 08/12/2014 0858    BMET    Component Value Date/Time   NA 140 07/07/2023 0554   NA 132 (L) 08/12/2014 0858   K 3.2 (L) 07/07/2023 0554   K 4.0 08/12/2014 0858   CL 106 07/07/2023 0554   CL 100 (L) 08/12/2014 0858   CO2 22 07/07/2023 0554   CO2 24 08/12/2014 0858   GLUCOSE 98 07/07/2023 0554   GLUCOSE 118 (H) 08/12/2014 0858   BUN 26 (H) 07/07/2023 0554   BUN 30 (H) 08/12/2014 0858   CREATININE 1.26 (H) 07/07/2023 0554   CREATININE 1.52 (H) 08/12/2014 0858   CALCIUM 9.1 07/07/2023 0554   CALCIUM 8.9 08/12/2014 0858   GFRNONAA >60 07/07/2023 0554   GFRNONAA 49 (L) 08/12/2014 0858   GFRAA >60 01/10/2020 0940   GFRAA 57 (L) 08/12/2014 0858    INR    Component Value Date/Time   INR 1.00 08/28/2015 1138     Intake/Output Summary (Last 24 hours) at 07/07/2023 1406 Last data filed at 07/07/2023 1136 Gross per 24 hour  Intake 1091.55 ml  Output 800 ml  Net 291.55 ml     Assessment/Plan:  68 y.o. male is s/p aortogram with selective left lower extremity angiogram.  Patient was noted to have stent placement to the left external iliac artery and a stent placed to the left common iliac artery 1 Day  Post-Op   PLAN Vascular surgery plans on taking the patient to the operating room on Monday 07/11/2023 for a left femoral distal bypass with profunda femoral endarterectomy.  I had a long detailed discussion this morning with the patient and his wife at the bedside concerning the procedure, benefits, risk, and complications.  Both verbalized her understanding and wished to proceed to soon as possible.  Insert all their questions this morning.  She will be made n.p.o. at midnight on Sunday for procedure on Monday.  DVT prophylaxis: Heparin 5000 units subcu every 8 hours   Marcie [facility] 07/07/2023 2:06 PM

## 2023-07-07 NOTE — Plan of Care (Signed)
  Problem: Education: Goal: Ability to describe self-care measures that may prevent or decrease complications (Diabetes Survival Skills Education) will improve Outcome: Progressing   Problem: Skin Integrity: Goal: Risk for impaired skin integrity will decrease Outcome: Progressing   Problem: Safety: Goal: Ability to remain free from injury will improve Outcome: Progressing   Problem: Skin Integrity: Goal: Risk for impaired skin integrity will decrease Outcome: Progressing   

## 2023-07-08 DIAGNOSIS — M869 Osteomyelitis, unspecified: Secondary | ICD-10-CM | POA: Diagnosis not present

## 2023-07-08 LAB — CBC
HCT: 29.3 % — ABNORMAL LOW (ref 39.0–52.0)
Hemoglobin: 9.5 g/dL — ABNORMAL LOW (ref 13.0–17.0)
MCH: 25.7 pg — ABNORMAL LOW (ref 26.0–34.0)
MCHC: 32.4 g/dL (ref 30.0–36.0)
MCV: 79.2 fL — ABNORMAL LOW (ref 80.0–100.0)
Platelets: 305 10*3/uL (ref 150–400)
RBC: 3.7 MIL/uL — ABNORMAL LOW (ref 4.22–5.81)
RDW: 14.3 % (ref 11.5–15.5)
WBC: 9.2 10*3/uL (ref 4.0–10.5)
nRBC: 0 % (ref 0.0–0.2)

## 2023-07-08 LAB — BASIC METABOLIC PANEL
Anion gap: 10 (ref 5–15)
BUN: 18 mg/dL (ref 8–23)
CO2: 22 mmol/L (ref 22–32)
Calcium: 8.6 mg/dL — ABNORMAL LOW (ref 8.9–10.3)
Chloride: 106 mmol/L (ref 98–111)
Creatinine, Ser: 1.13 mg/dL (ref 0.61–1.24)
GFR, Estimated: >60 mL/min (ref >60)
Glucose, Bld: 93 mg/dL (ref 70–99)
Potassium: 3.5 mmol/L (ref 3.5–5.1)
Sodium: 138 mmol/L (ref 135–145)

## 2023-07-08 LAB — GLUCOSE, CAPILLARY
Glucose-Capillary: 84 mg/dL (ref 70–99)
Glucose-Capillary: 90 mg/dL (ref 70–99)
Glucose-Capillary: 105 mg/dL — ABNORMAL HIGH (ref 70–99)
Glucose-Capillary: 101 mg/dL — ABNORMAL HIGH (ref 70–99)

## 2023-07-08 LAB — MAGNESIUM: Magnesium: 2 mg/dL (ref 1.7–2.4)

## 2023-07-08 LAB — PHOSPHORUS: Phosphorus: 2.1 mg/dL — ABNORMAL LOW (ref 2.5–4.6)

## 2023-07-08 MED ORDER — LOPERAMIDE HCL 2 MG PO CAPS
2.0000 mg | ORAL_CAPSULE | Freq: Once | ORAL | Status: AC
  Administered 2023-07-08: 2 mg via ORAL
  Filled 2023-07-08: qty 1

## 2023-07-08 NOTE — Progress Notes (Signed)
 Progress Note   Patient: [name] [name] GMW:[id number] DOB: Dec 25, 1955 DOA: 07/04/2023     4 DOS: the patient was seen and examined on 07/08/2023   Brief hospital course:  [name], [name] is a 68 year old male with history of non-insulin-dependent diabetes mellitus, has since stopped taking metformin, hypertension, hyperlipidemia, who presents emergency department for chief concerns of black toes on the left foot. He reports that he has noticed his toes bothering him and hurting with ambulation for about 1.5 months.  Denies any pain at rest, it hurts only when he is ambulating or putting weight on his left foot.   Vitals in the ED showed temperature of 99, respiration rate of 15, heart rate 70, blood pressure 129/80, SpO2 97% on room air.   Serum sodium is 141, potassium 3.9, chloride 107, bicarb 23, BUN of 24, serum creatinine of 1.26, EGFR greater than 60, nonfasting blood glucose 128, WBC 10.6, hemoglobin 10.8, platelets of 334.   Lactic acid is 0.9.   ED treatment: Zosyn, vancomycin.     Assessment and Plan:  # Foot osteomyelitis, left  Blood cultures are sterile Continue Unasyn and Zyvox Appreciate podiatry input, they recommend a left TMA Appreciate vascular surgery input, patient is status post left lower extremity angiogram 03/19 with plans for left lower extremity bypass surgery scheduled for 03/24   Cardiomyopathy Chronic systolic CHF, compensated. Patient had complete echo on 05/08/2022: With estimated ejection fraction < 20% Does not appear to be in acute exacerbation at this time Continue carvedilol with holding parameters as well as spironolactone   Diabetes mellitus (HCC)  HbA1c 6.3, well-controlled Patient has stopped taking metformin per outpatient provider Continue NovoLog sliding scale Continue consistent carbohydrate diet   History of rectal cancer Continue outpatient follow-up with hematology/oncology   HTN (hypertension) Continue  carvedilol and spironolactone Use hydralazine as needed Monitor BP and titrate medications accordingly    AKI Improved renal function Serum creatinine has improved from 1.40 >> 1.13   Diarrhea Probably related to antibiotic therapy Will give patient Imodium     Subjective: Complains of diarrhea but has no abdominal pain and is afebrile.  Physical Exam: Vitals:   07/07/23 1556 07/07/23 1948 07/08/23 0313 07/08/23 0734  BP: 112/67 132/74 (!) 116/58 137/75  Pulse: 65 60 69 65  Resp: 17 18 18 16   Temp: 98.4 F (36.9 C) 99 F (37.2 C) 99 F (37.2 C) 99.1 F (37.3 C)  TempSrc: Oral Oral    SpO2: 100% 100% 99% 99%  Weight:      Height:       General: NAD, lying comfortably Appear in no distress, affect appropriate Eyes: PERRLA ENT: Oral Mucosa Clear, moist  Neck: no JVD,  Cardiovascular: S1 and S2 Present, no Murmur,  Respiratory: good respiratory effort, Bilateral Air entry equal and Decreased, no Crackles, no wheezes Abdomen: Bowel Sound present, Soft and no tenderness,  Skin: no rashes Extremities: no Pedal edema, no calf tenderness, left foot toe erythematous, tender and foul-smelling, blackish discoloration noticed, Neurologic: without any new focal findings Gait not checked due to patient safety concerns   Data Reviewed:  Labs reviewed  Family Communication: Plan of care discussed with patient at the bedside.  All questions and concerns have been addressed.  He verbalizes understanding and agrees with the plan.  Disposition: Status is: Inpatient Remains inpatient appropriate because: Scheduled for left lower extremity bypass surgery on 03/24  Planned Discharge Destination:  TBD    Time spent: 35  minutes  Author: Lucile Shutters, MD 07/08/2023 9:29 AM  For on call review [email or web address].

## 2023-07-08 NOTE — Progress Notes (Signed)
 Heart Failure Navigator Progress Note  Assessed for Heart & Vascular TOC clinic readiness.  Patient does not meet criteria due to current [facility] patient of Dr. Juliann Pares, MD.  Navigator will sign off at this time.  Roxy Horseman, RN, BSN [facility] Heart Failure Navigator Secure Chat Only

## 2023-07-09 DIAGNOSIS — M869 Osteomyelitis, unspecified: Secondary | ICD-10-CM | POA: Diagnosis not present

## 2023-07-09 LAB — CBC
HCT: 30.2 % — ABNORMAL LOW (ref 39.0–52.0)
Hemoglobin: 9.6 g/dL — ABNORMAL LOW (ref 13.0–17.0)
MCH: 25.9 pg — ABNORMAL LOW (ref 26.0–34.0)
MCHC: 31.8 g/dL (ref 30.0–36.0)
MCV: 81.6 fL (ref 80.0–100.0)
Platelets: 302 10*3/uL (ref 150–400)
RBC: 3.7 MIL/uL — ABNORMAL LOW (ref 4.22–5.81)
RDW: 14.6 % (ref 11.5–15.5)
WBC: 9 10*3/uL (ref 4.0–10.5)
nRBC: 0 % (ref 0.0–0.2)

## 2023-07-09 LAB — GLUCOSE, CAPILLARY
Glucose-Capillary: 89 mg/dL (ref 70–99)
Glucose-Capillary: 93 mg/dL (ref 70–99)
Glucose-Capillary: 130 mg/dL — ABNORMAL HIGH (ref 70–99)
Glucose-Capillary: 139 mg/dL — ABNORMAL HIGH (ref 70–99)

## 2023-07-09 LAB — CULTURE, BLOOD (ROUTINE X 2)
Culture: NO GROWTH
Culture: NO GROWTH

## 2023-07-09 LAB — BASIC METABOLIC PANEL
Anion gap: 11 (ref 5–15)
BUN: 16 mg/dL (ref 8–23)
CO2: 22 mmol/L (ref 22–32)
Calcium: 8.7 mg/dL — ABNORMAL LOW (ref 8.9–10.3)
Chloride: 104 mmol/L (ref 98–111)
Creatinine, Ser: 1.21 mg/dL (ref 0.61–1.24)
GFR, Estimated: >60 mL/min (ref >60)
Glucose, Bld: 90 mg/dL (ref 70–99)
Potassium: 3.2 mmol/L — ABNORMAL LOW (ref 3.5–5.1)
Sodium: 137 mmol/L (ref 135–145)

## 2023-07-09 LAB — MAGNESIUM: Magnesium: 1.9 mg/dL (ref 1.7–2.4)

## 2023-07-09 LAB — PHOSPHORUS: Phosphorus: 2.1 mg/dL — ABNORMAL LOW (ref 2.5–4.6)

## 2023-07-09 MED ORDER — SODIUM CHLORIDE 0.9 % IV SOLN: INTRAVENOUS | Status: AC | PRN

## 2023-07-09 MED ORDER — POTASSIUM CHLORIDE CRYS ER 20 MEQ PO TBCR
40.0000 meq | EXTENDED_RELEASE_TABLET | Freq: Once | ORAL | Status: AC
  Administered 2023-07-09: 40 meq via ORAL
  Filled 2023-07-09: qty 2

## 2023-07-09 NOTE — Progress Notes (Signed)
 Progress Note   Patient: [name] [name] ZOX:[id number] DOB: 04-17-1956 DOA: 07/04/2023     5 DOS: the patient was seen and examined on 07/09/2023   Brief hospital course:  [name], [name] is a 68 year old male with history of non-insulin-dependent diabetes mellitus, has since stopped taking metformin, hypertension, hyperlipidemia, who presents emergency department for chief concerns of black toes on the left foot. He reports that he has noticed his toes bothering him and hurting with ambulation for about 1.5 months.  Denies any pain at rest, it hurts only when he is ambulating or putting weight on his left foot.   Vitals in the ED showed temperature of 99, respiration rate of 15, heart rate 70, blood pressure 129/80, SpO2 97% on room air.   Serum sodium is 141, potassium 3.9, chloride 107, bicarb 23, BUN of 24, serum creatinine of 1.26, EGFR greater than 60, nonfasting blood glucose 128, WBC 10.6, hemoglobin 10.8, platelets of 334.   Lactic acid is 0.9.   ED treatment: Zosyn, vancomycin.     Assessment and Plan:  # Foot osteomyelitis, left  # Severe PAD LLE Blood cultures are sterile Continue Unasyn and Zyvox Appreciate podiatry input, they recommend a left TMA Appreciate vascular surgery input, patient is status post left lower extremity angiogram 03/19 with plans for left lower extremity bypass surgery scheduled for 03/24   Cardiomyopathy Chronic systolic CHF, compensated. Patient had complete echo on 05/08/2022: With estimated ejection fraction < 20% Does not appear to be in acute exacerbation at this time Continue carvedilol with holding parameters as well as spironolactone   Diabetes mellitus (HCC)  HbA1c 6.3, well-controlled Patient has stopped taking metformin per outpatient provider Continue NovoLog sliding scale Continue consistent carbohydrate diet   History of rectal cancer Continue outpatient follow-up with hematology/oncology   HTN  (hypertension) Continue carvedilol and spironolactone Use hydralazine as needed Monitor BP and titrate medications accordingly    AKI Improved renal function Serum creatinine has improved from 1.40 >> 1.13   Diarrhea Probably related to antibiotic therapy Will give patient Imodium   Subjective: No significant overnight events, patient was resting comfortably, left foot pain is under control, no any other active issues.   Physical Exam: Vitals:   07/08/23 1508 07/08/23 2222 07/09/23 0302 07/09/23 0748  BP: 101/66 124/73 134/74 128/74  Pulse: 66 (!) 59 60 62  Resp: 16 14 18 18   Temp: 98.1 F (36.7 C) 99.1 F (37.3 C) 99 F (37.2 C) 99.3 F (37.4 C)  TempSrc:      SpO2: 100% 100% 99% 100%  Weight:      Height:       General: NAD, lying comfortably Appear in no distress, affect appropriate Eyes: PERRLA ENT: Oral Mucosa Clear, moist  Neck: no JVD,  Cardiovascular: S1 and S2 Present, no Murmur,  Respiratory: good respiratory effort, Bilateral Air entry equal and Decreased, no Crackles, no wheezes Abdomen: Bowel Sound present, Soft and no tenderness,  Skin: no rashes Extremities: no Pedal edema, no calf tenderness, left foot toe erythematous, tender and foul-smelling, blackish discoloration noticed, Neurologic: without any new focal findings Gait not checked due to patient safety concerns   Data Reviewed:  Labs reviewed  Family Communication: Plan of care discussed with patient at the bedside.  All questions and concerns have been addressed.  He verbalizes understanding and agrees with the plan.  Disposition: Status is: Inpatient Remains inpatient appropriate because: Scheduled for left lower extremity bypass surgery on 03/24  Planned Discharge  Destination:  TBD    Time spent: 35  minutes  Author: Gillis Santa, MD 07/09/2023 2:55 PM  For on call review [email or web address].

## 2023-07-09 NOTE — Progress Notes (Addendum)
      Daily Progress Note   Assessment/Planning:   POD #3 s/p L CIA and EIA PTA+S  Schedule for LEFT fem-tibial bypass on Monday Procedural questions answered Preop orders entered Continue wound care as directed by Podiatry   Subjective  - 3 Days Post-Op   Pain tolerable   Objective   Vitals:   07/08/23 0734 07/08/23 1508 07/08/23 2222 07/09/23 0302  BP: 137/75 101/66 124/73 134/74  Pulse: 65 66 (!) 59 60  Resp: 16 16 14 18   Temp: 99.1 F (37.3 C) 98.1 F (36.7 C) 99.1 F (37.3 C) 99 F (37.2 C)  TempSrc:      SpO2: 99% 100% 100% 99%  Weight:      Height:         Intake/Output Summary (Last 24 hours) at 07/09/2023 0657 Last data filed at 07/08/2023 1041 Gross per 24 hour  Intake 120 ml  Output --  Net 120 ml    VASC L foot bandage: odor of decay; R groin: no active bleeding    Laboratory   CBC    Latest Ref Rng & Units 07/09/2023    4:59 AM 07/08/2023    3:40 AM 07/07/2023    5:54 AM  CBC  WBC 4.0 - 10.5 K/uL 9.0  9.2  8.5   Hemoglobin 13.0 - 17.0 g/dL 9.6  9.5  13.0   Hematocrit 39.0 - 52.0 % 30.2  29.3  31.9   Platelets 150 - 400 K/uL 302  305  294     BMET    Component Value Date/Time   NA 137 07/09/2023 0459   NA 132 (L) 08/12/2014 0858   K 3.2 (L) 07/09/2023 0459   K 4.0 08/12/2014 0858   CL 104 07/09/2023 0459   CL 100 (L) 08/12/2014 0858   CO2 22 07/09/2023 0459   CO2 24 08/12/2014 0858   GLUCOSE 90 07/09/2023 0459   GLUCOSE 118 (H) 08/12/2014 0858   BUN 16 07/09/2023 0459   BUN 30 (H) 08/12/2014 0858   CREATININE 1.21 07/09/2023 0459   CREATININE 1.52 (H) 08/12/2014 0858   CALCIUM 8.7 (L) 07/09/2023 0459   CALCIUM 8.9 08/12/2014 0858   GFRNONAA >60 07/09/2023 0459   GFRNONAA 49 (L) 08/12/2014 0858   GFRAA >60 01/10/2020 0940   GFRAA 57 (L) 08/12/2014 0858     Leonides Sake, MD, FACS, FSVS Covering for [facility]: [phone]  07/09/2023, 6:57 AM

## 2023-07-10 ENCOUNTER — Inpatient Hospital Stay

## 2023-07-10 DIAGNOSIS — M869 Osteomyelitis, unspecified: Secondary | ICD-10-CM | POA: Diagnosis not present

## 2023-07-10 LAB — BASIC METABOLIC PANEL
Anion gap: 11 (ref 5–15)
BUN: 15 mg/dL (ref 8–23)
CO2: 21 mmol/L — ABNORMAL LOW (ref 22–32)
Calcium: 8.9 mg/dL (ref 8.9–10.3)
Chloride: 106 mmol/L (ref 98–111)
Creatinine, Ser: 1.32 mg/dL — ABNORMAL HIGH (ref 0.61–1.24)
GFR, Estimated: 58 mL/min — ABNORMAL LOW (ref >60)
Glucose, Bld: 119 mg/dL — ABNORMAL HIGH (ref 70–99)
Potassium: 3.9 mmol/L (ref 3.5–5.1)
Sodium: 138 mmol/L (ref 135–145)

## 2023-07-10 LAB — URINALYSIS, ROUTINE W REFLEX MICROSCOPIC
Bacteria, UA: NONE SEEN
Bilirubin Urine: NEGATIVE
Glucose, UA: NEGATIVE mg/dL
Ketones, ur: NEGATIVE mg/dL
Nitrite: NEGATIVE
Protein, ur: 100 mg/dL — AB
RBC / HPF: >50 RBC/hpf (ref 0–5)
Specific Gravity, Urine: 1.021 (ref 1.005–1.030)
pH: 6 (ref 5.0–8.0)

## 2023-07-10 LAB — CBC
HCT: 33.1 % — ABNORMAL LOW (ref 39.0–52.0)
Hemoglobin: 10.4 g/dL — ABNORMAL LOW (ref 13.0–17.0)
MCH: 25.6 pg — ABNORMAL LOW (ref 26.0–34.0)
MCHC: 31.4 g/dL (ref 30.0–36.0)
MCV: 81.3 fL (ref 80.0–100.0)
Platelets: 312 10*3/uL (ref 150–400)
RBC: 4.07 MIL/uL — ABNORMAL LOW (ref 4.22–5.81)
RDW: 14.7 % (ref 11.5–15.5)
WBC: 13.9 10*3/uL — ABNORMAL HIGH (ref 4.0–10.5)
nRBC: 0 % (ref 0.0–0.2)

## 2023-07-10 LAB — GLUCOSE, CAPILLARY
Glucose-Capillary: 102 mg/dL — ABNORMAL HIGH (ref 70–99)
Glucose-Capillary: 122 mg/dL — ABNORMAL HIGH (ref 70–99)
Glucose-Capillary: 126 mg/dL — ABNORMAL HIGH (ref 70–99)
Glucose-Capillary: 99 mg/dL (ref 70–99)

## 2023-07-10 LAB — PHOSPHORUS: Phosphorus: 2.5 mg/dL (ref 2.5–4.6)

## 2023-07-10 LAB — MAGNESIUM: Magnesium: 2 mg/dL (ref 1.7–2.4)

## 2023-07-10 MED ORDER — ORAL CARE MOUTH RINSE: 15.0000 mL | OROMUCOSAL | Status: DC | PRN

## 2023-07-10 MED ORDER — COLCHICINE 0.6 MG PO TABS
0.6000 mg | ORAL_TABLET | Freq: Every day | ORAL | Status: DC
  Administered 2023-07-10 – 2023-07-17 (×8): 0.6 mg via ORAL
  Filled 2023-07-10 (×8): qty 1

## 2023-07-10 MED ORDER — LOPERAMIDE HCL 2 MG PO CAPS
2.0000 mg | ORAL_CAPSULE | Freq: Three times a day (TID) | ORAL | Status: DC | PRN
  Administered 2023-07-14: 2 mg via ORAL
  Filled 2023-07-10: qty 1

## 2023-07-10 MED ORDER — LINEZOLID 600 MG PO TABS
600.0000 mg | ORAL_TABLET | Freq: Two times a day (BID) | ORAL | Status: DC
  Administered 2023-07-10 – 2023-07-20 (×21): 600 mg via ORAL
  Filled 2023-07-10 (×23): qty 1

## 2023-07-10 NOTE — Plan of Care (Signed)

## 2023-07-10 NOTE — Progress Notes (Addendum)
      Daily Progress Note   Assessment/Planning:   POD #4 s/p L CIA and EIA PTA+S  Preop orders placed Consent ordered All questions answered Wound mgmt per Podiatry   Subjective  - 4 Days Post-Op   No events yesterday, pt ready for surgery   Objective   Vitals:   07/09/23 1555 07/09/23 2049 07/09/23 2306 07/10/23 0356  BP: 130/70 (!) 162/88 (!) 140/77 118/68  Pulse: 78 91 79 75  Resp: 16 20 20 18   Temp: 97.9 F (36.6 C) (!) 101.8 F (38.8 C) 100.1 F (37.8 C) 100 F (37.8 C)  TempSrc:      SpO2: 98% 100% 100% 97%  Weight:      Height:         Intake/Output Summary (Last 24 hours) at 07/10/2023 1610 Last data filed at 07/10/2023 0424 Gross per 24 hour  Intake 323.54 ml  Output --  Net 323.54 ml    VASC L foot: bandaged, odor evident    Laboratory   CBC    Latest Ref Rng & Units 07/10/2023    2:25 AM 07/09/2023    4:59 AM 07/08/2023    3:40 AM  CBC  WBC 4.0 - 10.5 K/uL 13.9  9.0  9.2   Hemoglobin 13.0 - 17.0 g/dL 96.0  9.6  9.5   Hematocrit 39.0 - 52.0 % 33.1  30.2  29.3   Platelets 150 - 400 K/uL 312  302  305     BMET    Component Value Date/Time   NA 138 07/10/2023 0225   NA 132 (L) 08/12/2014 0858   K 3.9 07/10/2023 0225   K 4.0 08/12/2014 0858   CL 106 07/10/2023 0225   CL 100 (L) 08/12/2014 0858   CO2 21 (L) 07/10/2023 0225   CO2 24 08/12/2014 0858   GLUCOSE 119 (H) 07/10/2023 0225   GLUCOSE 118 (H) 08/12/2014 0858   BUN 15 07/10/2023 0225   BUN 30 (H) 08/12/2014 0858   CREATININE 1.32 (H) 07/10/2023 0225   CREATININE 1.52 (H) 08/12/2014 0858   CALCIUM 8.9 07/10/2023 0225   CALCIUM 8.9 08/12/2014 0858   GFRNONAA 58 (L) 07/10/2023 0225   GFRNONAA 49 (L) 08/12/2014 0858   GFRAA >60 01/10/2020 0940   GFRAA 57 (L) 08/12/2014 0858     Leonides Sake, MD, FACS, FSVS Covering for [place] [facility]: [phone]  07/10/2023, 9:22 AM

## 2023-07-10 NOTE — Progress Notes (Signed)
 Progress Note   Patient: [name] [name] ZOX:[id number] DOB: [birth year] DOA: 07/04/2023     6 DOS: the patient was seen and examined on 07/10/2023   Brief hospital course:  [name], [name] is a 68 year old male with history of non-insulin-dependent diabetes mellitus, has since stopped taking metformin, hypertension, hyperlipidemia, who presents emergency department for chief concerns of black toes on the left foot. He reports that he has noticed his toes bothering him and hurting with ambulation for about 1.5 months.  Denies any pain at rest, it hurts only when he is ambulating or putting weight on his left foot.   Vitals in the ED showed temperature of 99, respiration rate of 15, heart rate 70, blood pressure 129/80, SpO2 97% on room air.   Serum sodium is 141, potassium 3.9, chloride 107, bicarb 23, BUN of 24, serum creatinine of 1.26, EGFR greater than 60, nonfasting blood glucose 128, WBC 10.6, hemoglobin 10.8, platelets of 334.   Lactic acid is 0.9.   ED treatment: Zosyn, vancomycin.     Assessment and Plan:  # Foot osteomyelitis, left  # Severe PAD LLE Blood cultures are sterile Continue Unasyn and Zyvox Appreciate podiatry input, they recommend a left TMA Appreciate vascular surgery input, patient is status post left lower extremity angiogram 03/19 with plans for left lower extremity bypass surgery scheduled for 03/24   Cardiomyopathy Chronic systolic CHF, compensated. Patient had complete echo on 05/08/2022: With estimated EF  < 20% Does not appear to be in acute exacerbation at this time Continue carvedilol with holding parameters as well as spironolactone   Diabetes mellitus (HCC)  HbA1c 6.3, well-controlled Patient has stopped taking metformin per outpatient provider Continue NovoLog sliding scale Continue consistent carbohydrate diet   History of rectal cancer Continue outpatient follow-up with hematology/oncology   HTN (hypertension) Continue  carvedilol and spironolactone Use hydralazine as needed Monitor BP and titrate medications accordingly    AKI Improved renal function Serum creatinine has improved from 1.40 >> 1.13   Diarrhea Probably related to antibiotic therapy May use Imodium as needed Advised to eat yogurt for natural probiotics  Hematuria developed last night on 3/22 Hb 10.4 H&H stable Check bladder scan to rule out urinary retention Follow renal sonogram   History of gout, c/o left knee gout flareup on 3/23 Left knee noticed swollen on 3/23 Resumed colchicine 0.6 mg p.o. daily home dose   Subjective: Overnight patient had an episode of hematuria, still has some blood in the urine in the morning.  Mild suprapubic discomfort.  Patient was complaining of left knee gout flareup and having difficulty ambulation, requesting to resume home dose colchicine. Patient is still having diarrhea, hide 4-5 episodes yesterday.  Advised for probiotic but patient is on so many medications, so advised to eat yogurt  Physical Exam: Vitals:   07/09/23 1555 07/09/23 2049 07/09/23 2306 07/10/23 0356  BP: 130/70 (!) 162/88 (!) 140/77 118/68  Pulse: 78 91 79 75  Resp: 16 20 20 18   Temp: 97.9 F (36.6 C) (!) 101.8 F (38.8 C) 100.1 F (37.8 C) 100 F (37.8 C)  TempSrc:      SpO2: 98% 100% 100% 97%  Weight:      Height:       General: NAD, lying comfortably Appear in no distress, affect appropriate Eyes: PERRLA ENT: Oral Mucosa Clear, moist  Neck: no JVD,  Cardiovascular: S1 and S2 Present, no Murmur,  Respiratory: good respiratory effort, Bilateral Air entry equal and Decreased, no  Crackles, no wheezes Abdomen: Bowel Sound present, Soft and no tenderness,  Skin: no rashes Extremities: Left knee swollen and tender, no cellulitis, most likely gout.  left foot toe erythematous, tender and foul-smelling, blackish discoloration  Neurologic: without any new focal findings Gait not checked due to patient safety  concerns   Data Reviewed:  Labs reviewed  Family Communication: Plan of care discussed with patient at the bedside.  All questions and concerns have been addressed.  He verbalizes understanding and agrees with the plan.  Disposition: Status is: Inpatient Remains inpatient appropriate because: Scheduled for left lower extremity bypass surgery on 03/24  Planned Discharge Destination:  TBD    Time spent: 35  minutes  Author: Gillis Santa, MD 07/10/2023 1:55 PM  For on call review [email or web address].

## 2023-07-10 NOTE — H&P (View-Only) (Signed)
      Daily Progress Note   Assessment/Planning:   POD #4 s/p L CIA and EIA PTA+S  Preop orders placed Consent ordered All questions answered Wound mgmt per Podiatry   Subjective  - 4 Days Post-Op   No events yesterday, pt ready for surgery   Objective   Vitals:   07/09/23 1555 07/09/23 2049 07/09/23 2306 07/10/23 0356  BP: 130/70 (!) 162/88 (!) 140/77 118/68  Pulse: 78 91 79 75  Resp: 16 20 20 18   Temp: 97.9 F (36.6 C) (!) 101.8 F (38.8 C) 100.1 F (37.8 C) 100 F (37.8 C)  TempSrc:      SpO2: 98% 100% 100% 97%  Weight:      Height:         Intake/Output Summary (Last 24 hours) at 07/10/2023 1610 Last data filed at 07/10/2023 0424 Gross per 24 hour  Intake 323.54 ml  Output --  Net 323.54 ml    VASC L foot: bandaged, odor evident    Laboratory   CBC    Latest Ref Rng & Units 07/10/2023    2:25 AM 07/09/2023    4:59 AM 07/08/2023    3:40 AM  CBC  WBC 4.0 - 10.5 K/uL 13.9  9.0  9.2   Hemoglobin 13.0 - 17.0 g/dL 96.0  9.6  9.5   Hematocrit 39.0 - 52.0 % 33.1  30.2  29.3   Platelets 150 - 400 K/uL 312  302  305     BMET    Component Value Date/Time   NA 138 07/10/2023 0225   NA 132 (L) 08/12/2014 0858   K 3.9 07/10/2023 0225   K 4.0 08/12/2014 0858   CL 106 07/10/2023 0225   CL 100 (L) 08/12/2014 0858   CO2 21 (L) 07/10/2023 0225   CO2 24 08/12/2014 0858   GLUCOSE 119 (H) 07/10/2023 0225   GLUCOSE 118 (H) 08/12/2014 0858   BUN 15 07/10/2023 0225   BUN 30 (H) 08/12/2014 0858   CREATININE 1.32 (H) 07/10/2023 0225   CREATININE 1.52 (H) 08/12/2014 0858   CALCIUM 8.9 07/10/2023 0225   CALCIUM 8.9 08/12/2014 0858   GFRNONAA 58 (L) 07/10/2023 0225   GFRNONAA 49 (L) 08/12/2014 0858   GFRAA >60 01/10/2020 0940   GFRAA 57 (L) 08/12/2014 0858     Leonides Sake, MD, FACS, FSVS Covering for [place] [facility]: [phone]  07/10/2023, 9:22 AM

## 2023-07-11 ENCOUNTER — Encounter
Admission: EM | Disposition: A | Discharge status: Skilled Nursing Facility | Payer: Self-pay | Source: Home / Self Care | Attending: Student

## 2023-07-11 ENCOUNTER — Other Ambulatory Visit: Payer: Self-pay

## 2023-07-11 ENCOUNTER — Inpatient Hospital Stay: Admitting: Anesthesiology

## 2023-07-11 DIAGNOSIS — I70262 Atherosclerosis of native arteries of extremities with gangrene, left leg: Secondary | ICD-10-CM | POA: Diagnosis not present

## 2023-07-11 DIAGNOSIS — N35912 Unspecified bulbous urethral stricture, male: Secondary | ICD-10-CM | POA: Diagnosis not present

## 2023-07-11 DIAGNOSIS — M869 Osteomyelitis, unspecified: Secondary | ICD-10-CM | POA: Diagnosis not present

## 2023-07-11 HISTORY — PX: FEMORAL-TIBIAL BYPASS GRAFT: SHX938

## 2023-07-11 LAB — BASIC METABOLIC PANEL
Anion gap: 13 (ref 5–15)
BUN: 20 mg/dL (ref 8–23)
CO2: 20 mmol/L — ABNORMAL LOW (ref 22–32)
Calcium: 8.9 mg/dL (ref 8.9–10.3)
Chloride: 106 mmol/L (ref 98–111)
Creatinine, Ser: 1.32 mg/dL — ABNORMAL HIGH (ref 0.61–1.24)
GFR, Estimated: 58 mL/min — ABNORMAL LOW (ref >60)
Glucose, Bld: 92 mg/dL (ref 70–99)
Potassium: 3.2 mmol/L — ABNORMAL LOW (ref 3.5–5.1)
Sodium: 139 mmol/L (ref 135–145)

## 2023-07-11 LAB — PROTIME-INR
INR: 1.3 — ABNORMAL HIGH (ref 0.8–1.2)
Prothrombin Time: 16.6 s — ABNORMAL HIGH (ref 11.4–15.2)

## 2023-07-11 LAB — CBC
HCT: 30.5 % — ABNORMAL LOW (ref 39.0–52.0)
Hemoglobin: 9.8 g/dL — ABNORMAL LOW (ref 13.0–17.0)
MCH: 25.9 pg — ABNORMAL LOW (ref 26.0–34.0)
MCHC: 32.1 g/dL (ref 30.0–36.0)
MCV: 80.7 fL (ref 80.0–100.0)
Platelets: 330 10*3/uL (ref 150–400)
RBC: 3.78 MIL/uL — ABNORMAL LOW (ref 4.22–5.81)
RDW: 14.7 % (ref 11.5–15.5)
WBC: 15 10*3/uL — ABNORMAL HIGH (ref 4.0–10.5)
nRBC: 0 % (ref 0.0–0.2)

## 2023-07-11 LAB — GLUCOSE, CAPILLARY
Glucose-Capillary: 86 mg/dL (ref 70–99)
Glucose-Capillary: 93 mg/dL (ref 70–99)
Glucose-Capillary: 97 mg/dL (ref 70–99)
Glucose-Capillary: 82 mg/dL (ref 70–99)

## 2023-07-11 LAB — TYPE AND SCREEN
ABO/RH(D): A POS
Antibody Screen: NEGATIVE

## 2023-07-11 LAB — APTT: aPTT: 42 s — ABNORMAL HIGH (ref 24–36)

## 2023-07-11 LAB — CULTURE, BLOOD (ROUTINE X 2)

## 2023-07-11 LAB — PHOSPHORUS: Phosphorus: 2.8 mg/dL (ref 2.5–4.6)

## 2023-07-11 LAB — MAGNESIUM: Magnesium: 2 mg/dL (ref 1.7–2.4)

## 2023-07-11 SURGERY — CREATION, BYPASS, ARTERIAL, FEMORAL TO TIBIAL, USING GRAFT
Anesthesia: General | Site: Penis | Laterality: Left

## 2023-07-11 MED ORDER — GLYCOPYRROLATE 0.2 MG/ML IJ SOLN
INTRAMUSCULAR | Status: DC | PRN
  Administered 2023-07-11 (×2): .1 mg via INTRAVENOUS

## 2023-07-11 MED ORDER — HEPARIN 30,000 UNITS/1000 ML (OHS) CELLSAVER SOLUTION
Status: AC
  Filled 2023-07-11: qty 1000

## 2023-07-11 MED ORDER — GENTAMICIN SULFATE 40 MG/ML IJ SOLN
INTRAMUSCULAR | Status: AC
  Filled 2023-07-11: qty 2

## 2023-07-11 MED ORDER — OXYCODONE-ACETAMINOPHEN 5-325 MG PO TABS
1.0000 | ORAL_TABLET | Freq: Four times a day (QID) | ORAL | Status: DC | PRN
  Administered 2023-07-11 – 2023-07-14 (×6): 1 via ORAL
  Administered 2023-07-14 – 2023-07-19 (×7): 2 via ORAL
  Administered 2023-07-19: 1 via ORAL
  Administered 2023-07-20 – 2023-07-21 (×2): 2 via ORAL
  Filled 2023-07-11 (×2): qty 1
  Filled 2023-07-11: qty 2
  Filled 2023-07-11: qty 1
  Filled 2023-07-11: qty 2
  Filled 2023-07-11: qty 1
  Filled 2023-07-11 (×2): qty 2
  Filled 2023-07-11: qty 1
  Filled 2023-07-11: qty 2
  Filled 2023-07-11: qty 1
  Filled 2023-07-11 (×2): qty 2
  Filled 2023-07-11: qty 1
  Filled 2023-07-11 (×2): qty 2

## 2023-07-11 MED ORDER — VISTASEAL 4 ML SINGLE DOSE KIT
PACK | CUTANEOUS | Status: AC
  Filled 2023-07-11: qty 4

## 2023-07-11 MED ORDER — PHENYLEPHRINE 80 MCG/ML (10ML) SYRINGE FOR IV PUSH (FOR BLOOD PRESSURE SUPPORT)
PREFILLED_SYRINGE | INTRAVENOUS | Status: DC | PRN
  Administered 2023-07-11: 160 ug via INTRAVENOUS
  Administered 2023-07-11 (×3): 80 ug via INTRAVENOUS

## 2023-07-11 MED ORDER — ONDANSETRON HCL 4 MG/2ML IJ SOLN: 4.0000 mg | Freq: Once | INTRAMUSCULAR | Status: DC | PRN

## 2023-07-11 MED ORDER — FENTANYL CITRATE (PF) 100 MCG/2ML IJ SOLN
25.0000 ug | INTRAMUSCULAR | Status: DC | PRN
  Administered 2023-07-11: 25 ug via INTRAVENOUS

## 2023-07-11 MED ORDER — ONDANSETRON HCL 4 MG/2ML IJ SOLN
INTRAMUSCULAR | Status: DC | PRN
  Administered 2023-07-11 (×2): 4 mg via INTRAVENOUS

## 2023-07-11 MED ORDER — SEVOFLURANE IN SOLN
RESPIRATORY_TRACT | Status: AC
  Filled 2023-07-11: qty 250

## 2023-07-11 MED ORDER — FENTANYL CITRATE (PF) 100 MCG/2ML IJ SOLN
INTRAMUSCULAR | Status: DC | PRN
  Administered 2023-07-11 (×4): 25 ug via INTRAVENOUS

## 2023-07-11 MED ORDER — LACTATED RINGERS IV SOLN: INTRAVENOUS | Status: DC | PRN

## 2023-07-11 MED ORDER — SUGAMMADEX SODIUM 200 MG/2ML IV SOLN
INTRAVENOUS | Status: DC | PRN
  Administered 2023-07-11: 200 mg via INTRAVENOUS

## 2023-07-11 MED ORDER — ACETAMINOPHEN 10 MG/ML IV SOLN
INTRAVENOUS | Status: DC | PRN
  Administered 2023-07-11: 1000 mg via INTRAVENOUS

## 2023-07-11 MED ORDER — FENTANYL CITRATE (PF) 100 MCG/2ML IJ SOLN
INTRAMUSCULAR | Status: AC
Start: 2023-07-11 — End: ?
  Filled 2023-07-11: qty 2

## 2023-07-11 MED ORDER — CHLORHEXIDINE GLUCONATE CLOTH 2 % EX PADS
6.0000 | MEDICATED_PAD | Freq: Every day | CUTANEOUS | Status: DC
  Administered 2023-07-11 – 2023-07-22 (×12): 6 via TOPICAL

## 2023-07-11 MED ORDER — PROPOFOL 10 MG/ML IV BOLUS
INTRAVENOUS | Status: AC
  Filled 2023-07-11: qty 20

## 2023-07-11 MED ORDER — SODIUM CHLORIDE 0.9 % IV SOLN: INTRAVENOUS | Status: DC | PRN

## 2023-07-11 MED ORDER — VANCOMYCIN HCL 1000 MG IV SOLR
INTRAVENOUS | Status: DC | PRN
Start: 2023-07-11 — End: 2023-07-11
  Administered 2023-07-11: 1000 mg

## 2023-07-11 MED ORDER — KETAMINE HCL 10 MG/ML IJ SOLN
INTRAMUSCULAR | Status: DC | PRN
  Administered 2023-07-11 (×2): 10 mg via INTRAVENOUS
  Administered 2023-07-11: 20 mg via INTRAVENOUS
  Administered 2023-07-11: 10 mg via INTRAVENOUS

## 2023-07-11 MED ORDER — HEPARIN 30,000 UNITS/1000 ML (OHS) CELLSAVER SOLUTION
Status: AC | PRN
  Administered 2023-07-11: 1

## 2023-07-11 MED ORDER — PROPOFOL 10 MG/ML IV BOLUS
INTRAVENOUS | Status: DC | PRN
  Administered 2023-07-11: 100 mg via INTRAVENOUS

## 2023-07-11 MED ORDER — LACTATED RINGERS IV SOLN: INTRAVENOUS | Status: DC

## 2023-07-11 MED ORDER — HEPARIN SODIUM (PORCINE) 5000 UNIT/ML IJ SOLN
INTRAMUSCULAR | Status: AC
  Filled 2023-07-11: qty 1

## 2023-07-11 MED ORDER — PHENYLEPHRINE HCL-NACL 20-0.9 MG/250ML-% IV SOLN
INTRAVENOUS | Status: DC | PRN
  Administered 2023-07-11: 40 ug/min via INTRAVENOUS

## 2023-07-11 MED ORDER — DEXAMETHASONE SODIUM PHOSPHATE 10 MG/ML IJ SOLN
INTRAMUSCULAR | Status: DC | PRN
  Administered 2023-07-11: 5 mg via INTRAVENOUS

## 2023-07-11 MED ORDER — PHENYLEPHRINE HCL-NACL 20-0.9 MG/250ML-% IV SOLN
INTRAVENOUS | Status: AC
  Filled 2023-07-11: qty 250

## 2023-07-11 MED ORDER — CEFAZOLIN SODIUM-DEXTROSE 2-4 GM/100ML-% IV SOLN
INTRAVENOUS | Status: AC
  Filled 2023-07-11: qty 100

## 2023-07-11 MED ORDER — ALBUMIN HUMAN 5 % IV SOLN
INTRAVENOUS | Status: AC
  Filled 2023-07-11: qty 250

## 2023-07-11 MED ORDER — ALBUMIN HUMAN 5 % IV SOLN: INTRAVENOUS | Status: DC | PRN

## 2023-07-11 MED ORDER — FENTANYL CITRATE (PF) 100 MCG/2ML IJ SOLN
INTRAMUSCULAR | Status: AC
  Filled 2023-07-11: qty 2

## 2023-07-11 MED ORDER — HEMOSTATIC AGENTS (NO CHARGE) OPTIME
TOPICAL | Status: DC | PRN
  Administered 2023-07-11 (×2): 1 via TOPICAL

## 2023-07-11 MED ORDER — MORPHINE SULFATE (PF) 2 MG/ML IV SOLN
2.0000 mg | INTRAVENOUS | Status: DC | PRN
  Administered 2023-07-16 – 2023-07-22 (×2): 2 mg via INTRAVENOUS
  Filled 2023-07-11 (×2): qty 1

## 2023-07-11 MED ORDER — GENTAMICIN SULFATE 40 MG/ML IJ SOLN
INTRAMUSCULAR | Status: DC | PRN
  Administered 2023-07-11: 80 mg

## 2023-07-11 MED ORDER — ROCURONIUM BROMIDE 100 MG/10ML IV SOLN
INTRAVENOUS | Status: DC | PRN
  Administered 2023-07-11: 40 mg via INTRAVENOUS
  Administered 2023-07-11: 10 mg via INTRAVENOUS
  Administered 2023-07-11 (×4): 20 mg via INTRAVENOUS

## 2023-07-11 MED ORDER — POTASSIUM CHLORIDE CRYS ER 20 MEQ PO TBCR
40.0000 meq | EXTENDED_RELEASE_TABLET | ORAL | Status: AC
Start: 2023-07-11 — End: 2023-07-11

## 2023-07-11 MED ORDER — HEPARIN SODIUM (PORCINE) 1000 UNIT/ML IJ SOLN
INTRAMUSCULAR | Status: DC | PRN
Start: 2023-07-11 — End: 2023-07-11
  Administered 2023-07-11: 6000 [IU] via INTRAVENOUS
  Administered 2023-07-11: 2000 [IU] via INTRAVENOUS

## 2023-07-11 MED ORDER — VANCOMYCIN HCL 1000 MG IV SOLR
INTRAVENOUS | Status: AC
Start: 2023-07-11 — End: ?
  Filled 2023-07-11: qty 20

## 2023-07-11 MED ORDER — ACETAMINOPHEN 10 MG/ML IV SOLN: 1000.0000 mg | Freq: Once | INTRAVENOUS | Status: DC | PRN

## 2023-07-11 MED ORDER — VASHE WOUND IRRIGATION OPTIME
TOPICAL | Status: DC | PRN
  Administered 2023-07-11: 34 [oz_av]

## 2023-07-11 MED ORDER — KETAMINE HCL 50 MG/5ML IJ SOSY
PREFILLED_SYRINGE | INTRAMUSCULAR | Status: AC
  Filled 2023-07-11: qty 5

## 2023-07-11 MED ORDER — LIDOCAINE HCL (CARDIAC) PF 100 MG/5ML IV SOSY
PREFILLED_SYRINGE | INTRAVENOUS | Status: DC | PRN
  Administered 2023-07-11: 60 mg via INTRAVENOUS

## 2023-07-11 MED ORDER — VISTASEAL 4 ML SINGLE DOSE KIT
PACK | CUTANEOUS | Status: DC | PRN
Start: 2023-07-11 — End: 2023-07-11
  Administered 2023-07-11: 4 mL via TOPICAL

## 2023-07-11 MED ORDER — ACETAMINOPHEN 10 MG/ML IV SOLN
INTRAVENOUS | Status: AC
  Filled 2023-07-11: qty 100

## 2023-07-11 MED ORDER — EPHEDRINE SULFATE-NACL 50-0.9 MG/10ML-% IV SOSY
PREFILLED_SYRINGE | INTRAVENOUS | Status: DC | PRN
  Administered 2023-07-11 (×3): 5 mg via INTRAVENOUS

## 2023-07-11 SURGICAL SUPPLY — 83 items
APPLIER CLIP 11 MED OPEN (CLIP) IMPLANT
APPLIER CLIP 13 LRG OPEN (CLIP) IMPLANT
APPLIER CLIP 9.375 SM OPEN (CLIP) IMPLANT
BAG DECANTER FOR FLEXI CONT (MISCELLANEOUS) ×2 IMPLANT
BAG ISOLATATION DRAPE 20X20 ST (DRAPES) ×2 IMPLANT
BLADE SURG 15 STRL LF DISP TIS (BLADE) ×2 IMPLANT
BLADE SURG SZ11 CARB STEEL (BLADE) ×2 IMPLANT
BNDG COHESIVE 6X5 TAN ST LF (GAUZE/BANDAGES/DRESSINGS) IMPLANT
BRUSH SCRUB EZ 4% CHG (MISCELLANEOUS) ×2 IMPLANT
CATH FOLEY 2W COUNCIL 5CC 16FR (CATHETERS) IMPLANT
CATH SET URETHRAL DILATOR (CATHETERS) IMPLANT
CATH URETL OPEN END 6FR 70 (CATHETERS) IMPLANT
CHLORAPREP W/TINT 26 (MISCELLANEOUS) ×4 IMPLANT
CLAMP SUTURE YELLOW 5 PAIRS (MISCELLANEOUS) ×4 IMPLANT
CLEANSER WND VASHE INSTL 34OZ (WOUND CARE) IMPLANT
CLIP APPLIE 11 MED OPEN (CLIP) IMPLANT
CLIP APPLIE 13 LRG OPEN (CLIP) IMPLANT
CLIP APPLIE 9.375 SM OPEN (CLIP) IMPLANT
COVER PROBE FLX POLY STRL (MISCELLANEOUS) IMPLANT
DRAPE INCISE IOBAN 66X45 STRL (DRAPES) ×4 IMPLANT
DRAPE SHEET LG 3/4 BI-LAMINATE (DRAPES) ×2 IMPLANT
DRESSING SURGICEL FIBRLLR 1X2 (HEMOSTASIS) ×2 IMPLANT
DRSG OPSITE POSTOP 4X6 (GAUZE/BANDAGES/DRESSINGS) IMPLANT
DRSG OPSITE POSTOP 4X8 (GAUZE/BANDAGES/DRESSINGS) IMPLANT
DRSG SURGICEL FIBRILLAR 1X2 (HEMOSTASIS) ×4 IMPLANT
ELECT CAUTERY BLADE 6.4 (BLADE) ×2 IMPLANT
ELECT REM PT RETURN 9FT ADLT (ELECTROSURGICAL) ×4 IMPLANT
ELECTRODE REM PT RTRN 9FT ADLT (ELECTROSURGICAL) ×2 IMPLANT
GAUZE 4X4 16PLY ~~LOC~~+RFID DBL (SPONGE) IMPLANT
GLOVE BIO SURGEON STRL SZ7 (GLOVE) ×2 IMPLANT
GLOVE SURG SYN 8.0 (GLOVE) ×2 IMPLANT
GLOVE SURG SYN 8.0 PF PI (GLOVE) ×2 IMPLANT
GOWN STRL REUS W/ TWL LRG LVL3 (GOWN DISPOSABLE) ×6 IMPLANT
GOWN STRL REUS W/ TWL XL LVL3 (GOWN DISPOSABLE) ×2 IMPLANT
GOWN STRL REUS W/TWL 2XL LVL3 (GOWN DISPOSABLE) ×2 IMPLANT
GUIDEWIRE STR DUAL SENSOR (WIRE) IMPLANT
HEAD CUTTING 'VALVULOTOME URSL (MISCELLANEOUS) ×2 IMPLANT
IV NS 500ML BAXH (IV SOLUTION) ×2 IMPLANT
KIT PREVENA INCISION MGT 13 (CANNISTER) IMPLANT
KIT STIMULAN RAPID CURE 5CC (Orthopedic Implant) IMPLANT
KIT TURNOVER KIT A (KITS) ×2 IMPLANT
LABEL OR SOLS (LABEL) ×2 IMPLANT
LOOP VESSEL MAXI 1X406 RED (MISCELLANEOUS) ×6 IMPLANT
LOOP VESSEL MINI 0.8X406 BLUE (MISCELLANEOUS) ×8 IMPLANT
MANIFOLD NEPTUNE II (INSTRUMENTS) ×2 IMPLANT
NDL FILTER BLUNT 18X1 1/2 (NEEDLE) ×2 IMPLANT
NEEDLE FILTER BLUNT 18X1 1/2 (NEEDLE) ×2 IMPLANT
NS IRRIG 1000ML POUR BTL (IV SOLUTION) ×2 IMPLANT
PACK BASIN MAJOR ARMC (MISCELLANEOUS) ×2 IMPLANT
PACK UNIVERSAL (MISCELLANEOUS) ×2 IMPLANT
PAD PREP OB/GYN DISP 24X41 (PERSONAL CARE ITEMS) ×2 IMPLANT
PATCH VASC XENOSURE .8CM X 8CM (Vascular Products) IMPLANT
PENCIL SMOKE EVACUATOR (MISCELLANEOUS) ×2 IMPLANT
SET WALTER ACTIVATION W/DRAPE (SET/KITS/TRAYS/PACK) ×2 IMPLANT
SPONGE T-LAP 18X18 ~~LOC~~+RFID (SPONGE) IMPLANT
STAPLER SKIN PROX 35W (STAPLE) ×2 IMPLANT
SUT ETHILON 3-0 FS-10 30 BLK (SUTURE) ×4 IMPLANT
SUT GTX CV-5 36 TTC13 3/8CIR (SUTURE) IMPLANT
SUT GTX CV-6 30 TTC13 3/8CIR (SUTURE) IMPLANT
SUT MNCRL 4-0 27 PS-2 XMFL (SUTURE) IMPLANT
SUT PROLENE 5 0 RB 1 DA (SUTURE) ×8 IMPLANT
SUT PROLENE 6 0 BV (SUTURE) ×12 IMPLANT
SUT PROLENE 7 0 BV 1 (SUTURE) ×4 IMPLANT
SUT SILK 2 0 SH (SUTURE) IMPLANT
SUT SILK 2-0 18XBRD TIE 12 (SUTURE) ×2 IMPLANT
SUT SILK 3-0 18XBRD TIE 12 (SUTURE) ×2 IMPLANT
SUT VIC AB 2-0 CT1 TAPERPNT 27 (SUTURE) ×4 IMPLANT
SUT VICRYL+ 3-0 36IN CT-1 (SUTURE) ×4 IMPLANT
SUTURE EHLN 3-0 FS-10 30 BLK (SUTURE) ×4 IMPLANT
SUTURE GTX CV-5 36 TTC13 3/8CR (SUTURE) IMPLANT
SUTURE GTX CV-6 30 TTC13 3/8CR (SUTURE) IMPLANT
SUTURE MNCRL 4-0 27XMF (SUTURE) IMPLANT
SYR 20ML LL LF (SYRINGE) ×2 IMPLANT
SYR 3ML LL SCALE MARK (SYRINGE) ×2 IMPLANT
SYR BULB IRRIG 60ML STRL (SYRINGE) ×2 IMPLANT
TAG SUTURE CLAMP YLW 5PR (MISCELLANEOUS) ×4 IMPLANT
TOWEL OR 17X26 4PK STRL BLUE (TOWEL DISPOSABLE) IMPLANT
TRAP FLUID SMOKE EVACUATOR (MISCELLANEOUS) ×2 IMPLANT
TRAY FOLEY MTR SLVR 16FR STAT (SET/KITS/TRAYS/PACK) ×2 IMPLANT
VALVULOTOME HEAD CUTTING URSL (MISCELLANEOUS) IMPLANT
VALVULOTOME URESIL (MISCELLANEOUS) ×2 IMPLANT
VASC PATCH XENOSURE .8CM X 8CM (Vascular Products) ×2 IMPLANT
WATER STERILE IRR 500ML POUR (IV SOLUTION) ×2 IMPLANT

## 2023-07-11 NOTE — Op Note (Signed)
 [facility]   OPERATIVE NOTE     PRE-OPERATIVE DIAGNOSIS: Atherosclerosis with gangrene left lower extremity  POST-OPERATIVE DIAGNOSIS: Same as above  PROCEDURE: Left femoral artery to posterior tibial artery bypass with in-situ saphenous vein graft Separate and distinct left profunda femoris artery endarterectomy and patch angioplasty with bovine pericardial patch  SURGEONS: [name], MD and Levora Dredge, MD  ANESTHESIA: general  ESTIMATED BLOOD LOSS: 300 cc  FINDING(S): Adequate saphenous vein for bypass although it was a dual system and we had to ligate the more superficial saphenous vein system. Significant left profunda femoris artery plaque Adequate posterior tibial artery for target  SPECIMEN(S): Left profunda femoris artery plaque  INDICATIONS:   [name] Ribas [name] is a 68 y.o. male who presents with gangrene of the left foot with severe profunda femoris artery stenosis as well as recurrent long segment left SFA and popliteal occlusion.  The patient required surgical bypass for revascularization.  Risks and benefits were discussed including but not limited to bleeding, infection, thrombosis, limb loss, injury to nearby structures, cardiopulmonary complications, and death.  DESCRIPTION: After full informed written consent was obtained, the patient was brought back to the operating room and placed supine upon the operating table.  Prior to induction, the patient was given intravenous antibiotics.  After obtaining adequate anesthesia, the patient was prepped and draped in the standard fashion for a femoral to popliteal bypass operation.  Attention was turned to the left groin.  A longitudinal incision was made over the left common femoral artery.  Using blunt dissection and electrocautery, the artery was dissected out from the inguinal ligament down to the femoral bifurcation.  The superficial femoral artery, profunda femoral artery, and external  iliac artery were dissected out and vessel loops applied.  Circumflex branches were also dissected and controlled with vessel loops.  This common femoral artery was patent found on exam to be mild to moderately calcified.  We dissected out beyond the primary profunda femoris artery branches were disease was most severe to get out to a good distal portion of the profunda femoris artery for the endarterectomy    At this point, attention was turned to the calf.  An longitudinal incision was made one finger-width posterior to the tibia.  Using blunt dissection and electrocautery, a plane was developed through the subcutaneous tissue and fascia down to the popliteal space and tibial space.  The popliteal vein and tibial veins were dissected out and retracted medially and posteriorly.  The posterior tibial artery was dissected away from the tibial veins.  This posterior tibial artery was found to be a suitable target for the distal bypass anastomosis.    This incision also gave access to evaluation of the great saphenous vein.  This was marginal in size for bypass but was still felt to likely be a better option for prostatic bypass particularly in the setting of frank gangrenous changes to the foot.   At this point, the patient's left greater saphenous vein was dissected out for harvest for conduit.  Skip incisions was made over the greater saphenous vein from the saphenofemoral junction down to distal incision for the posterior tibial artery.  There appeared to be a duplicate saphenous system and we had to ligate the smaller and more superficial of the 2 saphenous vein systems.  This started around the level of the knee and terminated near the saphenofemoral junction.  The vein conduit was found to be adequate for bypass conduit.  Side branches of  greater saphenous vein were tied off with 3-0 silk ties. We dissected down beyond the site of planned distal anastomosis to allow it to swing over and perform an  anastomosis without tension. We ligated and divided the vein distally in the calf.  The distal vein was ligated with a 2-0 silk tie. We then passed [facility] dilators and were able to pass dilators starting at 2 mm up to 3.5 mm difficulty distally.  At the end of this process, we felt the conduit to be adequate in quality and size for use.  At this point, the patient was given 6000 units of Heparin intravenously.  After waiting three minutes, the external iliac artery, superficial femoral artery and profunda femoral artery were clamped.  An incision was made in the mid common femoral artery and extended proximally and distally with a Potts scissor.    The vein was taken off of the saphenofemoral junction after clamping the junction.  The junction was closed with two layers of 5-0 Prolene suture.  The proximal conduit was cut and bevelled to match the arteriotomy.  The conduit was sewn to the common femoral artery with a running stitch of 6-0 Prolene.  Prior to completing this anastomosis, all vessels were backbled.  No thrombus was noted from any vessels and backbleeding was good.  The anastomosis was completed in the usual fashion.  We then passed the valvulotome to remove all valves and allow for flow of blood through the vein graft.  Excellent pulsatile flow was seen after passing the larger of the 2 valvulotomes.   Attention was then turned to the tibial exposure. We reset the exposure of the tibial space.  We verified the posterior tibial artery was appropriately marked.  We determine the target segment for the anastomosis.  We applied tension to control the artery proximally and distally with vessel loops.  We made an incision with a 11-blade in the artery and extended it proximally and distally with a Potts scissor.  I pulled the conduit to appropriate tension and length, taking into account straightening out the leg.  We adjusted the length of the conduit sharply.  We spatulated this conduit to meet  the dimensions of the arteriotomy.  The conduit was sewn to the posterior tibial artery with a running stitch of 6-0 Prolene.  Prior to completing this anastomosis, all vessels were backbled. No thrombus was noted from any vessels and backbleeding was good.  The bypass conduit was allowed to bleed in an antegrade fashion with excellent pulsatile flow.  The anastomosis was completed in the usual fashion.  Attention was then turned back to the groin incision for performance of the profunda femoris endarterectomy.  This was a separate distinct lesion and provided blood flow to the thigh as well as collateral blood flow distally if there was failure of the bypass.  Given the heavily diseased profunda femoris artery with greater than 90% stenosis, and the need to improve this flow a separate and distinct profunda femoris artery endarterectomy was to be performed.  We used Vesseloops to control the profunda femoris artery branches and a small profunda clamp distally on the distal profunda femoris artery.  The common femoral artery was clamped proximally.  A separate anterior wall arteriotomy was created with an 11 blade in the profunda femoris artery and extended up to its origin.  Thick, calcific plaque was identified in the profunda femoris artery and this went down below the primary branches of the profunda femoris artery.  We extended  the endarterectomy distal to get a good distal endpoint.  Proximally, we were able to take this up to the common femoral artery below the bypass anastomosis proximally and get a good clean proximal endpoint.  We then selected a bovine pericardial patch.  This was cut and beveled to match the arteriotomy.  A 6-0 Prolene was started at the proximal endpoint and a second 6-0 Prolene was started at the distal endpoint.  Suture lines were run medially and laterally bring these together to complete the anastomosis.  Prior to completing the anastomosis, the vessel was flushed and no thrombus  was seen.  The suture line was completed and excellent pulsatile flow was seen in the distal profunda femoris artery beyond the endarterectomy site.  All bleeding points were controlled with 6-0 Prolene sutures as needed.  At this point, the ultrasound was brought on the field to evaluate the saphenous vein for any residual branches that were not ligated during our dissection.  2 small incisions were made in the proximal and mid thigh and we dissected out and ligated 3 venous branches off of the saphenous vein in these locations.  There was also saphenous vein branch that was identified distally at the level of the knee that was ligated and divided.  At this point, there was excellent pulsatile flow through the graft.  At this point, all incisions were washed out with Vashe irrigation. Fibrillar and Vistacel were placed into both incisions.  Vancomycin and gentamicin impregnated beads were placed in the incisions as well.  At this point, bleeding in both incisions were controlled with electrocautery and suture ligature.  The calf incision was closed with interrupted deep sutures to reapproximate the deep muscles and a running stitch of 3-0 Vicryl in the subcutaneous tissue.  The skin was reapproximated with staples. .  Attention was turned to the groin.  The groin was repaired with a double layer of 2-0 Vicryl immediately superficial to the bypass conduit.  The superficial subcutaneous tissue was reapproximated with two layers of 3-0 Vicryl.  The skin was reapproximated with staples.  Sterile dressing was placed.  The vein harvest incisions were closed with a layer of 3-0 Vicryl in the subcutaneous tissue.  The skin was then reapproximated with staples.  The skin was cleaned, dried, and sterile bandages applied.   The patient was then awakened from anesthesia and taken to the recovery room in stable condition having tolerated the procedure well.     COMPLICATIONS: none  CONDITION: stable   [name] 07/11/2023 4:23 PM   This note was created with Dragon Medical transcription system. Any errors in dictation are purely unintentional.

## 2023-07-11 NOTE — Transfer of Care (Signed)
 Immediate Anesthesia Transfer of Care Note  Patient: NIKOLAJ GERAGHTY [name]  Procedure(s) Performed: CREATION, BYPASS, ARTERIAL, FEMORAL TO TIBIAL, USING GRAFT, INSITU (Left: Groin) APPLICATION OF CELL SAVER (Left: Groin) DILATION, URETHRA, WITH GENERAL ANESTHESIA, DIFFICULT CATHETER INSERTION (Penis)  Patient Location: PACU  Anesthesia Type:General  Level of Consciousness: drowsy  Airway & Oxygen Therapy: Patient Spontanous Breathing and Patient connected to face mask oxygen  Post-op Assessment: Report given to RN  Post vital signs: stable  Last Vitals:  Vitals Value Taken Time  BP 105/69 07/11/23 1647  Temp    Pulse 63 07/11/23 1650  Resp 25 07/11/23 1650  SpO2 100 % 07/11/23 1650  Vitals shown include unfiled device data.  Last Pain:  Vitals:   07/11/23 1000  TempSrc: Temporal  PainSc:       Patients Stated Pain Goal: 0 (07/07/23 0923)  Complications: No notable events documented.

## 2023-07-11 NOTE — Interval H&P Note (Signed)
 History and Physical Interval Note:  07/11/2023 10:16 AM  [name] [name] [name]  has presented today for surgery, with the diagnosis of Gangrene.  The various methods of treatment have been discussed with the patient and family. After consideration of risks, benefits and other options for treatment, the patient has consented to  Procedure(s): CREATION, BYPASS, ARTERIAL, FEMORAL TO TIBIAL, USING GRAFT (Left) APPLICATION OF CELL SAVER (Left) as a surgical intervention.  The patient's history has been reviewed, patient examined, no change in status, stable for surgery.  I have reviewed the patient's chart and labs.  Questions were answered to the patient's satisfaction.     [name]

## 2023-07-11 NOTE — Anesthesia Procedure Notes (Signed)
 Arterial Line Insertion Start/End3/24/2025 11:50 AM, 07/11/2023 11:55 AM Performed by: Reed Breech, MD, anesthesiologist  Patient location: Pre-op. Preanesthetic checklist: patient identified, IV checked, site marked, risks and benefits discussed, surgical consent, monitors and equipment checked, pre-op evaluation, timeout performed and anesthesia consent Patient sedated Right, radial was placed Catheter size: 20 G Hand hygiene performed  and maximum sterile barriers used   Attempts: 1 Procedure performed using ultrasound guided technique. Following insertion, dressing applied and Biopatch. Post procedure assessment: normal and unchanged  Patient tolerated the procedure well with no immediate complications.

## 2023-07-11 NOTE — Anesthesia Procedure Notes (Signed)
 Procedure Name: Intubation Date/Time: 07/11/2023 12:20 PM  Performed by: Jaye Beagle, CRNAPre-anesthesia Checklist: Patient identified, Emergency Drugs available, Suction available and Patient being monitored Patient Re-evaluated:Patient Re-evaluated prior to induction Oxygen Delivery Method: Circle system utilized Preoxygenation: Pre-oxygenation with 100% oxygen Induction Type: IV induction Ventilation: Mask ventilation without difficulty Laryngoscope Size: McGrath and 4 Grade View: Grade I Tube type: Oral Tube size: 7.5 mm Number of attempts: 1 Airway Equipment and Method: Stylet and Oral airway Placement Confirmation: ETT inserted through vocal cords under direct vision, positive ETCO2 and breath sounds checked- equal and bilateral Secured at: 21 cm Tube secured with: Tape Dental Injury: Teeth and Oropharynx as per pre-operative assessment

## 2023-07-11 NOTE — Op Note (Signed)
 [facility]   OPERATIVE NOTE     PRE-OPERATIVE DIAGNOSIS: Atherosclerotic occlusive disease bilateral lower extremities with gangrene of the left forefoot  POST-OPERATIVE DIAGNOSIS: Same  PROCEDURE: Left femoral artery to posterior tibial artery bypass with in-situ saphenous vein graft Left profunda femoris endarterectomy with bovine patch angioplasty separate and distinct  CO-SURGEONS: Merita Norton and Annice Needy, MD  ASSISTANT(S): none  ANESTHESIA: general  ESTIMATED BLOOD LOSS: 300 cc  FINDING(S): The left common femoral is free of hemodynamically significant stenosis.  The left SFA is occluded from its origin distally.  There is a separate and distinct greater than 80% stenosis of the left profunda femoris from just distal to the origin of the profunda femoris extending 2 to 3 cm distally.  SPECIMEN(S): Calcific plaque from the profunda femoris to pathology for permanent section  INDICATIONS:   [name] AKER [name] is a 68 y.o. male who presents with gangrenous changes to the left forefoot.  The patient requires surgical bypass for revascularization.  Risks and benefits were discussed including but not limited to bleeding, infection, thrombosis, limb loss, injury to nearby structures, cardiopulmonary complications, and death.  DESCRIPTION: After full informed written consent was obtained, the patient was brought back to the operating room and placed supine upon the operating table.  Prior to induction, the patient was given intravenous antibiotics.  After obtaining adequate anesthesia, the patient was prepped and draped in the standard fashion for a femoral to popliteal bypass operation.    Co-surgeons are required because this is a complicated procedure with work being performed simultaneously by both surgeons.  This expedites the procedure making a shorter operative time reducing complications and improving patient safety.  Attention was  turned to the left groin.  A longitudinal incision was made over the left common femoral artery.  Using blunt dissection and electrocautery, the artery was dissected out from the inguinal ligament down to the femoral bifurcation.  The superficial femoral artery, profunda femoral artery, and external iliac artery were dissected out and vessel loops applied.  Circumflex branches were also dissected and controlled with vessel loops.  This common femoral artery was pulsatile and soft anteriorly some posterior plaque is palpable.    At this point, attention was turned to the calf.  An longitudinal incision was made one finger-width posterior to the tibia.  Using blunt dissection and electrocautery, a plane was developed through the subcutaneous tissue and the fascia was opened.  Gastroc and soleus were retracted posteriorly.  The posterior tibial artery and veins were then identified and the posterior tibial artery dissected circumferentially.  It was noted to be soft and suitable for distal anastomosis..      At this point, the patient's left greater saphenous vein was dissected circumferentially and the medial calf incision.  Side branches were ligated with 3-0 and 4-0 silk ties and then divided.  At this point having identified the distal target the saphenous vein was ligated and divided with a 2-0 silk tie.  Using [facility] coronary dilators we were able to pass a 2 mm, 2-1/2 mm and 3 mm coronary dilator.  The 3-1/2 mm did not advance into the vein very easily and we elected not to dilate the vein any larger.   At the end of this process, I felt the conduit to be adequate in quality and size for use.  Working in the groin the proximal 10 cm of the saphenous vein was dissected circumferentially.  A Satinsky clamp was then  placed below the saphenofemoral junction and the saphenous vein disconnected from the common femoral vein with tenotomy scissors.  The venotomy in the common femoral vein was then closed with a  5-0 Prolene using a horizontal mattress suture followed by a baseball stitch.  Clamp was removed and the suture line was intact.  The saphenofemoral junction was then everted and the most proximal valve was lysed with Potts scissors under direct visualization.  The vein was then spatulated.  At this point, the patient was given 6000 units of Heparin intravenously.  After waiting three minutes, the external iliac artery, superficial femoral artery and profunda femoral artery were clamped.  An incision was made in the common femoral artery and extended proximally and distally with a Potts scissor.  The saphenous vein was then anastomosed to the common femoral artery using running 6-0 Prolene in an and vein to side artery fashion.  Suture line was completed and flushing maneuvers were performed and flow was then established into the vein graft.  Bleeding spots along the suture line were treated with 6-0 Prolene interrupted sutures and small pieces of the bovine patch were used as pledgets.  At this point the valvulotome was passed from the distal venotomy up to the junction and the anastomosis with the common femoral.  Using the 2 mm blade and then the 3 mm blade a total of 5 passes were made 3 with a 2 mm blade and 2 with the 3 mm blade.  This point we had return of pulsatile flow out the distal end of the vein graft.  Attention was then turned to the posterior tibial exposure.   We reset the exposure of the posterior tibial space.  We verified the popliteal artery was appropriately marked.  We determine the target segment for the anastomosis.  We applied tension to control the artery proximally and distally with vessel loops.  Arteriotomy was made with a 11-blade in the artery and extended it proximally and distally with a Potts scissor.  We spatulated the saphenous vein to meet the dimensions of the arteriotomy.  The conduit was sewn to the posterior tibial artery with a running stitch of 6-0 Prolene.  Prior  to completing this anastomosis, all vessels were backbled. No thrombus was noted from any vessels and backbleeding was good.  The bypass conduit was allowed to bleed in an antegrade fashion with excellent pulsatile flow.  The anastomosis was completed in the usual fashion.  At this point we returned our attention to the groin.  The profunda femoris was then clamped proximally and distally.  This is a completely separate arteriotomy and repair.  The in situ bypass originates from the common femoral and the arteriotomy and endarterectomy was in the profunda femoris exclusively.  Arteriotomy is made with an 11 blade and then extended with Potts scissors.  Endarterectomy was then performed with a freer elevator under direct visualization within the profunda femoris.  Plaque was passed off the field the specimen.  The bovine pericardial patch was then used to repair of the arteriotomy using 6-0 Prolene in a running fashion.  Flushing maneuvers were performed and flow was reestablished through the profunda femoris.  Any bleeding spots were controlled with interrupted 6-0 Prolene suture.  Ultrasound was now placed in a sterile sleeve and the vein bypass was interrogated from the calf up to the groin.  Surgical marker was used to identify the location of branches of the vein.  Once the vein had been scanned twice and branches  verified then small incisions were created over the branches dissection carried down to identify the saphenous vein and then locate the branch.  Branch was then ligated with 2 oh or 3-0 silk ties.  At this point, all incisions were washed out with Vashe.  All incisions were inspected for hemostasis which was achieved with Bovie cautery and 3-0 Vicryl.  Vistaseal was then placed in the groin wound along with fibrillar are.  Antibiotic beads that have been reconstituted with vancomycin and gentamicin were also placed in the groin wound.  The calf and thigh incisions were closed with interrupted  stitch of 3-0 Vicryl in the subcutaneous tissue.  The skin was reapproximated with staples. .  Attention was turned to the groin.  The groin was repaired with a double layer of 2-0 Vicryl immediately superficial to the bypass conduit.  The superficial subcutaneous tissue was reapproximated with two layers of 3-0 Vicryl.  The skin was reapproximated with a running subcuticular of 4-0 Monocryl.  The skin was cleaned, dried, and reinforced with Dermabond.  The skin was cleaned, dried, and sterile honeycomb bandages applied.   The patient was then awakened from anesthesia and taken to the recovery room in stable condition having tolerated the procedure well.     COMPLICATIONS: none  CONDITION: stable   Levora Dredge 07/11/2023 4:35 PM   This note was created with Dragon Medical transcription system. Any errors in dictation are purely unintentional.

## 2023-07-11 NOTE — Anesthesia Preprocedure Evaluation (Addendum)
 Anesthesia Evaluation  Patient identified by MRN, date of birth, ID band Patient awake    Reviewed: Allergy & Precautions, NPO status , Patient's Chart, lab work & pertinent test results  History of Anesthesia Complications Negative for: history of anesthetic complications  Airway Mallampati: III   Neck ROM: Full    Dental  (+) Missing, Edentulous Upper   Pulmonary former smoker (quit 1986)   Pulmonary exam normal breath sounds clear to auscultation       Cardiovascular hypertension, + CAD (s/p MI and stents), + Peripheral Vascular Disease and +CHF (EF 20%)  Normal cardiovascular exam Rhythm:Regular Rate:Normal  Echo 07/05/23:   1. Left ventricular ejection fraction, by estimation, is 20%. The left ventricle has severely decreased function. The left ventricle demonstrates regional wall motion abnormalities (see scoring diagram/findings for description). The left ventricular internal cavity size was mildly to moderately dilated. Left ventricular diastolic parameters are consistent with Grade I diastolic dysfunction (impaired relaxation).  2. Right ventricular systolic function is normal. The right ventricular size is normal.  3. Left atrial size was mildly dilated.  4. The mitral valve is normal in structure. Mild mitral valve regurgitation.  5. The aortic valve is normal in structure. Aortic valve regurgitation is not visualized.   Neuro/Psych  PSYCHIATRIC DISORDERS Anxiety     negative neurological ROS     GI/Hepatic Rectal CA   Endo/Other  diabetes, Type 2    Renal/GU Renal disease (stage III CKD; nephrolithiasis)     Musculoskeletal Gout with active flare L knee   Abdominal   Peds  Hematology  (+) Blood dyscrasia, anemia   Anesthesia Other Findings Cardiology note 11/02/22:  1: Ischemic heart failure with reduced ejection fraction- - NYHA class I - euvolemic today  - weighing daily; reminded to call for an  overnight weight gain of > 2 pounds or a weekly weight gain of > 5 pounds - weight up 3 pounds from last visit here 3 months ago - echo 05/08/22: EF of <20% along with moderate pleural effusion in the left lateral region and moderate MR/TR.  - continue 20mg  lasix with additional 20mg  as needed for weight gain, worsening swelling or SOB - continue spironolactone 25mg  daily with parameters to hold if SBP<100; rarely has to hold it now - continue carvedilol 3.125mg  BID - reviewed, again, using SGLT2 but they are concerned about the cost; will reach out to PharmD and see if they're eligible for any assistance; asked if they wanted me to call once we found out but they said to wait until their next appt - reports having a cough with both entresto and lisinopril - losartan remains on hold & BP outside of the office still has SBP in the low 100's so will defer this for now - BNP 05/05/22 was 3983.4   2: HTN with CKD- - BP 136/81 but this is after he walked up to the office today - sees PCP @ [facility]  - BMP 05/13/22 showed sodium 136, potassium 4.2, creatinine 1.22 & GFR >60 - update BMP at next visit if not done elsewhere   3: DM- - A1c 05/06/22 was 5.5%   4: CAD- - STEMI 2005 - cath done (PCI/ stent) 2005 & cath done 2016 - saw cardiology Juliann Pares) 06/24   5: History of rectal cancer- - saw GI Allegra Lai) 11/23 - saw oncology Cathie Hoops) 10/23   Return in 6 months, sooner if needed.   Reproductive/Obstetrics  Anesthesia Physical Anesthesia Plan  ASA: 4  Anesthesia Plan: General   Post-op Pain Management:    Induction: Intravenous  PONV Risk Score and Plan: 2 and Ondansetron, Dexamethasone and Treatment may vary due to age or medical condition  Airway Management Planned: Oral ETT  Additional Equipment: Arterial line  Intra-op Plan:   Post-operative Plan: Extubation in OR  Informed Consent: I have reviewed the patients History  and Physical, chart, labs and discussed the procedure including the risks, benefits and alternatives for the proposed anesthesia with the patient or authorized representative who has indicated his/her understanding and acceptance.     Dental advisory given  Plan Discussed with: CRNA  Anesthesia Plan Comments: (Patient consented for risks of anesthesia including but not limited to:  - adverse reactions to medications - damage to eyes, teeth, lips or other oral mucosa - nerve damage due to positioning  - sore throat or hoarseness - damage to heart, brain, nerves, lungs, other parts of body or loss of life  Informed patient about role of CRNA in peri- and intra-operative care.  Patient voiced understanding.)        Anesthesia Quick Evaluation

## 2023-07-11 NOTE — Plan of Care (Signed)

## 2023-07-11 NOTE — Progress Notes (Incomplete)
 Heart Failure Stewardship Pharmacy Note  PCP: Lorn Junes, FNP (Inactive) PCP-Cardiologist: None  HPI: [name] BRISCO [name] is a 68 y.o. male with HTN, HLD, CAD, rectal cancer s/p ileostomy, systolic CHF, gout, and diabetes who presented with black toes on the left foot. Vascular consulted and patient underwent stent placement to the left external iliac artery and to the left common iliac artery.  Pertinent cardiac history: Longstanding history of PAD with interventions. CAD with STEMI in 2005 s/p PCI with stenting. Echo in 09/2015 and 11/2016 with LVEF of 55-60%. Stress test in 11/2016 consistent with prior MI, noted to be low risk. Echo in 04/2022 with LVEF severely reduced to <20%, moderate-severely dilated LV, normal RV function, moderate MR, moderate TR. Echo in 06/2023 with LVEF 20% along with grade I diastolic dysfunction.  Pertinent Lab Values: Creatinine  Date Value Ref Range Status  08/12/2014 1.52 (H) mg/dL Final    Comment:    2.95-6.21 NOTE: New Reference Range  06/25/14    Creatinine, Ser  Date Value Ref Range Status  07/11/2023 1.32 (H) 0.61 - 1.24 mg/dL Final   BUN  Date Value Ref Range Status  07/11/2023 20 8 - 23 mg/dL Final  30/86/5784 30 (H) mg/dL Final    Comment:    6-96 NOTE: New Reference Range  06/25/14    Potassium  Date Value Ref Range Status  07/11/2023 3.2 (L) 3.5 - 5.1 mmol/L Final  08/12/2014 4.0 mmol/L Final    Comment:    3.5-5.1 NOTE: New Reference Range  06/25/14    Sodium  Date Value Ref Range Status  07/11/2023 139 135 - 145 mmol/L Final  08/12/2014 132 (L) mmol/L Final    Comment:    135-145 NOTE: New Reference Range  06/25/14    B Natriuretic Peptide  Date Value Ref Range Status  05/05/2022 3,983.4 (H) 0.0 - 100.0 pg/mL Final    Comment:    Performed at [facility], [address] [place], [place] [address]   Magnesium  Date Value Ref Range Status  07/11/2023 2.0 1.7 - 2.4 mg/dL Final    Comment:     Performed at [facility], [address] [place], [place] [address]   Hgb A1c MFr Bld  Date Value Ref Range Status  07/05/2023 6.3 (H) 4.8 - 5.6 % Final    Comment:    (NOTE) Pre diabetes:          5.7%-6.4%  Diabetes:              >6.4%  Glycemic control for   <7.0% adults with diabetes    TSH  Date Value Ref Range Status  05/05/2022 3.965 0.350 - 4.500 uIU/mL Final    Comment:    Performed by a 3rd Generation assay with a functional sensitivity of <=0.01 uIU/mL. Performed at [facility], [address] [place], [place] [address]     Vital Signs: Admission weight: Temp:  [98.4 F (36.9 C)-99.1 F (37.3 C)] 98.6 F (37 C) (03/24 0735) Pulse Rate:  [71-79] 71 (03/24 0735) Resp:  [18-20] 18 (03/24 0735) BP: (114-133)/(70-75) 117/74 (03/24 0735) SpO2:  [94 %-99 %] 99 % (03/24 0735) No intake or output data in the 24 hours ending 07/11/23 0745  Current Heart Failure Medications:  Loop diuretic: Beta-Blocker: ACEI/ARB/ARNI: MRA: SGLT2i: Other:  Prior to admission Heart Failure Medications:  Loop diuretic: furosemide 40 mg daily Beta-Blocker: carvedilol 3.125 mg BID ACEI/ARB/ARNI: none MRA: spironolactone 12.5 mg BID SGLT2i: none  Other:  Assessment: 1. {CHL AMB Acute or Acute on chronic:210917277} {Systolic/diastolic/combined systolic/diastolic:210917278} heart failure (LVEF ***%) ***, due to ***. NYHA class *** symptoms.  -Symptoms: -Volume: -Hemodynamics: -BB: -ACEI/ARB/ARNI: -MRA: -SGLT2i:  Plan: 1) Medication changes recommended at this time:  2) Patient assistance:   3) Education: -To be completed prior to discharge.  *** Medication Assistance / Insurance Benefits Check: Does the patient have prescription insurance?    Type of insurance plan:  Does the patient qualify for medication assistance through manufacturers or grants? {CHL AMB Yes/No/Pending:210917269}  Eligible grants and/or patient assistance programs:  ***  Medication assistance applications in progress: ***  Medication assistance applications approved: *** Approved medication assistance renewals will be completed by: ***  Outpatient Pharmacy: Prior to admission outpatient pharmacy: ***      ***

## 2023-07-11 NOTE — Progress Notes (Signed)
 Progress Note   Patient: [name] [name]:[id number] DOB: Nov 10, 1955 DOA: 07/04/2023     7 DOS: the patient was seen and examined on 07/11/2023   Brief hospital course:  [name], [name] is a 68 year old male with history of non-insulin-dependent diabetes mellitus, has since stopped taking metformin, hypertension, hyperlipidemia, who presents emergency department for chief concerns of black toes on the left foot. He reports that he has noticed his toes bothering him and hurting with ambulation for about 1.5 months.  Denies any pain at rest, it hurts only when he is ambulating or putting weight on his left foot.   Vitals in the ED showed temperature of 99, respiration rate of 15, heart rate 70, blood pressure 129/80, SpO2 97% on room air.   Serum sodium is 141, potassium 3.9, chloride 107, bicarb 23, BUN of 24, serum creatinine of 1.26, EGFR greater than 60, nonfasting blood glucose 128, WBC 10.6, hemoglobin 10.8, platelets of 334.   Lactic acid is 0.9.   ED treatment: Zosyn, vancomycin.     Assessment and Plan:  # Foot osteomyelitis, left  # Severe PAD LLE Blood cultures are sterile Continue Unasyn and Zyvox Appreciate podiatry input, they recommend a left TMA Appreciate vascular surgery input, patient is status post left lower extremity angiogram 03/19 with plans for left lower extremity bypass surgery scheduled today 03/24   Cardiomyopathy Chronic systolic CHF, compensated. Patient had complete echo on 05/08/2022: With estimated EF  < 20% Does not appear to be in acute exacerbation at this time Continue carvedilol with holding parameters as well as spironolactone   Diabetes mellitus (HCC)  HbA1c 6.3, well-controlled Patient has stopped taking metformin per outpatient provider Continue NovoLog sliding scale Continue consistent carbohydrate diet   History of rectal cancer Continue outpatient follow-up with hematology/oncology   HTN (hypertension) Continue  carvedilol and spironolactone Use hydralazine as needed Monitor BP and titrate medications accordingly    AKI Improved renal function Serum creatinine has improved from 1.40 >> 1.13 Cr 1.32 today  Hypokalemia, potassium repleted.  Diarrhea Probably related to antibiotic therapy May use Imodium as needed Advised to eat yogurt for natural probiotics  Hematuria developed last night on 3/22 Hb 9.8 today H&H fairly stable US renal: Possible blood clot in the bladder. Difficult to exclude malignancy.  5 mm nonobstructing right renal stone. Bilateral renal cysts. Hematuria possible due to bilateral renal cyst Patient may need Foley catheter if urinary retention Needs to follow-up with urology    History of gout, c/o left knee gout flareup on 3/23 Left knee noticed swollen on 3/23 Resumed colchicine 0.6 mg p.o. daily home dose   Subjective: No significant events overnight, patient was lying comfortably in the bed.  Pain is under control.  Left lower extremity bypass surgery scheduled today.   Physical Exam: Vitals:   07/11/23 0343 07/11/23 0735 07/11/23 0838 07/11/23 1000  BP: 133/70 117/74 123/65 121/71  Pulse: 79 71 70   Resp: 20 18 16 18   Temp: 98.4 F (36.9 C) 98.6 F (37 C) 98.7 F (37.1 C) 97.8 F (36.6 C)  TempSrc:  Oral  Temporal  SpO2: 94% 99% 98% 96%  Weight:      Height:       General: NAD, lying comfortably Appear in no distress, affect appropriate Eyes: PERRLA ENT: Oral Mucosa Clear, moist  Neck: no JVD,  Cardiovascular: S1 and S2 Present, no Murmur,  Respiratory: good respiratory effort, Bilateral Air entry equal and Decreased, no Crackles, no wheezes  Abdomen: Bowel Sound present, Soft and no tenderness,  Skin: no rashes Extremities: Left knee swollen and tender, no cellulitis, most likely gout.  left foot toe erythematous, tender and foul-smelling, blackish discoloration  Neurologic: without any new focal findings Gait not checked due to patient  safety concerns   Data Reviewed:  Labs reviewed  Family Communication: Plan of care discussed with patient at the bedside.  All questions and concerns have been addressed.  He verbalizes understanding and agrees with the plan.  Disposition: Status is: Inpatient Remains inpatient appropriate because: Scheduled for left lower extremity bypass surgery on 03/24  Planned Discharge Destination:  TBD    Time spent: 35  minutes  Author: Gillis Santa, MD 07/11/2023 3:51 PM  For on call review [email or web address].

## 2023-07-11 NOTE — Plan of Care (Signed)
  Problem: Education: Goal: Ability to describe self-care measures that may prevent or decrease complications (Diabetes Survival Skills Education) will improve Outcome: Progressing   Problem: Metabolic: Goal: Ability to maintain appropriate glucose levels will improve Outcome: Progressing   Problem: Nutritional: Goal: Maintenance of adequate nutrition will improve Outcome: Progressing   Problem: Pain Managment: Goal: General experience of comfort will improve and/or be controlled Outcome: Progressing   Problem: Safety: Goal: Ability to remain free from injury will improve Outcome: Progressing

## 2023-07-11 NOTE — Op Note (Signed)
 Date of procedure: 07/11/23  Preoperative diagnosis:  Bulbar urethral stricture  Postoperative diagnosis:  Same as above  Procedure: Urethral dilation Difficult Foley catheter placement  Surgeon: Vanna Scotland, MD  Anesthesia: General  Complications: None  Intraoperative findings: Presumed dense bulbar urethral stricture requiring urethral dilation over a wire.  EBL: Minimal  Specimens: None  Drains: 16 French council tip Foley catheter  Indication: CARLESTER KASPAREK [name] is a 68 y.o. patient with personal history of dense bulbar urethral stricture requiring dilations in the past.  He presents today for vascular bypass and Foley catheter was unable to be placed by the OR nursing staff.  Urology was asked to assist..  After reviewing the management options for treatment, he elected to proceed with the above surgical procedure(s). We have discussed the potential benefits and risks of the procedure, side effects of the proposed treatment, the likelihood of the patient achieving the goals of the procedure, and any potential problems that might occur during the procedure or recuperation. Informed consent has been obtained.  Description of procedure:  Upon my arrival, not knowing about his urethral stricture history, I did attempt an 49 coud which became readily apparent that he had a fairly dense stricture presumably at the bulbar urethra based on the location where the catheter coiled.  At this point in time, after prepping and draping the patient in the standard surgical way, a sensor wire was able to be advanced actually quite easily into the bladder presumably.  I then advanced a 6 [place] open-ended ureteral catheter over the wire into the bladder, removed the wire and confirmed adequate position with a drip from the open-ended ureteral catheter.  I then replaced the wire.  Next, Cook urethral dilators were used starting at 10 [place] all the way up to 37 [place].  I then attempted to  advance a 16 [place] council tip over the wire but met too much resistance.  I redilated then again up to the 18 [place] which was tight and allowed to dwell for a minute or 2.  Ultimately, family was able to advance the 69 [place] with some difficulty over the wire until was hubbed.  The wire was removed and there was flow of clear yellow urine.  The balloon was insufflated with 10 cc of sterile water.  The read of the procedure was completed by vascular surgery.  Patient had already received Zosyn prior to the procedure so no additional antibiotics were given.  Plan: Patient will need Foley catheter for at least a week.  Will plan for outpatient Foley removal.  Vanna Scotland, M.D.

## 2023-07-11 NOTE — Progress Notes (Signed)
 Pt arrived to unit at this time. Pt is alert and oriented X4. Left leg incision and dressings assessed. New drainage circled. Ice pack in place. Aline zeroed and VSS on room air.

## 2023-07-12 ENCOUNTER — Encounter: Payer: Self-pay | Admitting: Vascular Surgery

## 2023-07-12 DIAGNOSIS — M869 Osteomyelitis, unspecified: Secondary | ICD-10-CM | POA: Diagnosis not present

## 2023-07-12 DIAGNOSIS — Z95828 Presence of other vascular implants and grafts: Secondary | ICD-10-CM

## 2023-07-12 LAB — CBC
HCT: 26.6 % — ABNORMAL LOW (ref 39.0–52.0)
Hemoglobin: 8.5 g/dL — ABNORMAL LOW (ref 13.0–17.0)
MCH: 25.9 pg — ABNORMAL LOW (ref 26.0–34.0)
MCHC: 32 g/dL (ref 30.0–36.0)
MCV: 81.1 fL (ref 80.0–100.0)
Platelets: 328 10*3/uL (ref 150–400)
RBC: 3.28 MIL/uL — ABNORMAL LOW (ref 4.22–5.81)
RDW: 15 % (ref 11.5–15.5)
WBC: 17.1 10*3/uL — ABNORMAL HIGH (ref 4.0–10.5)
nRBC: 0 % (ref 0.0–0.2)

## 2023-07-12 LAB — BASIC METABOLIC PANEL
Anion gap: 11 (ref 5–15)
BUN: 28 mg/dL — ABNORMAL HIGH (ref 8–23)
CO2: 20 mmol/L — ABNORMAL LOW (ref 22–32)
Calcium: 8.3 mg/dL — ABNORMAL LOW (ref 8.9–10.3)
Chloride: 107 mmol/L (ref 98–111)
Creatinine, Ser: 1.26 mg/dL — ABNORMAL HIGH (ref 0.61–1.24)
GFR, Estimated: >60 mL/min (ref >60)
Glucose, Bld: 138 mg/dL — ABNORMAL HIGH (ref 70–99)
Potassium: 3.7 mmol/L (ref 3.5–5.1)
Sodium: 138 mmol/L (ref 135–145)

## 2023-07-12 LAB — PHOSPHORUS: Phosphorus: 3.7 mg/dL (ref 2.5–4.6)

## 2023-07-12 LAB — GLUCOSE, CAPILLARY
Glucose-Capillary: 125 mg/dL — ABNORMAL HIGH (ref 70–99)
Glucose-Capillary: 183 mg/dL — ABNORMAL HIGH (ref 70–99)
Glucose-Capillary: 151 mg/dL — ABNORMAL HIGH (ref 70–99)
Glucose-Capillary: 148 mg/dL — ABNORMAL HIGH (ref 70–99)

## 2023-07-12 LAB — MAGNESIUM: Magnesium: 2.1 mg/dL (ref 1.7–2.4)

## 2023-07-12 MED ORDER — CLOPIDOGREL BISULFATE 75 MG PO TABS
75.0000 mg | ORAL_TABLET | Freq: Every day | ORAL | Status: DC
  Administered 2023-07-12 – 2023-07-22 (×11): 75 mg via ORAL
  Filled 2023-07-12 (×11): qty 1

## 2023-07-12 NOTE — Progress Notes (Incomplete)
 Heart Failure Stewardship Pharmacy Note  PCP: Lorn Junes, FNP (Inactive) PCP-Cardiologist: None  HPI: [name] KIER [name] is a 68 y.o. male with HTN, HLD, CAD, rectal cancer s/p ileostomy, systolic CHF, gout, and diabetes who presented with black toes on the left foot. Vascular consulted and patient underwent stent placement to the left external iliac artery and to the left common iliac artery.  Pertinent cardiac history: Longstanding history of PAD with interventions. CAD with STEMI in 2005 s/p PCI with stenting. Echo in 09/2015 and 11/2016 with LVEF of 55-60%. Stress test in 11/2016 consistent with prior MI, noted to be low risk. Echo in 04/2022 with LVEF severely reduced to <20%, moderate-severely dilated LV, normal RV function, moderate MR, moderate TR. Echo in 06/2023 with LVEF 20% along with grade I diastolic dysfunction.  Pertinent Lab Values: Creatinine  Date Value Ref Range Status  08/12/2014 1.52 (H) mg/dL Final    Comment:    1.30-8.65 NOTE: New Reference Range  06/25/14    Creatinine, Ser  Date Value Ref Range Status  07/12/2023 1.26 (H) 0.61 - 1.24 mg/dL Final   BUN  Date Value Ref Range Status  07/12/2023 28 (H) 8 - 23 mg/dL Final  78/46/9629 30 (H) mg/dL Final    Comment:    5-28 NOTE: New Reference Range  06/25/14    Potassium  Date Value Ref Range Status  07/12/2023 3.7 3.5 - 5.1 mmol/L Final  08/12/2014 4.0 mmol/L Final    Comment:    3.5-5.1 NOTE: New Reference Range  06/25/14    Sodium  Date Value Ref Range Status  07/12/2023 138 135 - 145 mmol/L Final  08/12/2014 132 (L) mmol/L Final    Comment:    135-145 NOTE: New Reference Range  06/25/14    B Natriuretic Peptide  Date Value Ref Range Status  05/05/2022 3,983.4 (H) 0.0 - 100.0 pg/mL Final    Comment:    Performed at [facility], [address] [place], [place] [address]   Magnesium  Date Value Ref Range Status  07/12/2023 2.1 1.7 - 2.4 mg/dL Final    Comment:     Performed at [facility], [address] [place], [place] [address]   Hgb A1c MFr Bld  Date Value Ref Range Status  07/05/2023 6.3 (H) 4.8 - 5.6 % Final    Comment:    (NOTE) Pre diabetes:          5.7%-6.4%  Diabetes:              >6.4%  Glycemic control for   <7.0% adults with diabetes    TSH  Date Value Ref Range Status  05/05/2022 3.965 0.350 - 4.500 uIU/mL Final    Comment:    Performed by a 3rd Generation assay with a functional sensitivity of <=0.01 uIU/mL. Performed at [facility], [address] [place], [place] [address]     Vital Signs: Admission weight: Temp:  [97 F (36.1 C)-98.8 F (37.1 C)] 98.8 F (37.1 C) (03/25 0800) Pulse Rate:  [54-87] 87 (03/25 0800) Cardiac Rhythm: Normal sinus rhythm (03/25 0800) Resp:  [0-27] 26 (03/25 0800) BP: (97-128)/(60-88) 128/83 (03/25 0800) SpO2:  [99 %-100 %] 100 % (03/25 0800) Arterial Line BP: (93-146)/(47-98) 146/69 (03/25 0800)  Intake/Output Summary (Last 24 hours) at 07/12/2023 1009 Last data filed at 07/12/2023 0904 Gross per 24 hour  Intake 1150 ml  Output 1340 ml  Net -190 ml    Current Heart Failure Medications:  Loop diuretic: Beta-Blocker: ACEI/ARB/ARNI: MRA: SGLT2i: Other:  Prior to admission Heart Failure Medications:  Loop diuretic: furosemide 40 mg daily Beta-Blocker: carvedilol 3.125 mg BID ACEI/ARB/ARNI: none MRA: spironolactone 12.5 mg BID SGLT2i: none Other:  Assessment: 1. {CHL AMB Acute or Acute on chronic:210917277} {Systolic/diastolic/combined systolic/diastolic:210917278} heart failure (LVEF ***%) ***, due to ***. NYHA class *** symptoms.  -Symptoms: -Volume: -Hemodynamics: -BB: -ACEI/ARB/ARNI: -MRA: -SGLT2i:  Plan: 1) Medication changes recommended at this time:  2) Patient assistance:   3) Education: -To be completed prior to discharge.  *** Medication Assistance / Insurance Benefits Check: Does the patient have prescription  insurance?    Type of insurance plan:  Does the patient qualify for medication assistance through manufacturers or grants? {CHL AMB Yes/No/Pending:210917269}  Eligible grants and/or patient assistance programs: ***  Medication assistance applications in progress: ***  Medication assistance applications approved: *** Approved medication assistance renewals will be completed by: ***  Outpatient Pharmacy: Prior to admission outpatient pharmacy: ***      ***

## 2023-07-12 NOTE — Progress Notes (Signed)
 Daily Progress Note   Subjective  - 1 Day Post-Op  Follow-up left foot gangrene forefoot.  Status post left femoral artery and posterior tibial artery bypass with saphenous vein and left profunda femoris endarterectomy.  1 day status post surgery.  He complains of some soreness to his left foot.  Sitting in bed comfortably today.  Objective Vitals:   07/12/23 0700 07/12/23 0800 07/12/23 0900 07/12/23 1000  BP:  128/83 106/80 104/68  Pulse: 83 87 86 (!) 56  Resp: (!) 23 (!) 26 (!) 22 (!) 21  Temp:  98.8 F (37.1 C)    TempSrc:  Oral    SpO2: 99% 100% 100% 99%  Weight:      Height:        Physical Exam: Gangrenous changes to the forefoot most notably the second third and fourth toes to the level of the metatarsophalangeal joint just proximal to this region.  Mild drainage.  Some foul odor.       Laboratory CBC    Component Value Date/Time   WBC 17.1 (H) 07/12/2023 0243   HGB 8.5 (L) 07/12/2023 0243   HGB 11.7 (L) 08/12/2014 0858   HCT 26.6 (L) 07/12/2023 0243   HCT 37.1 (L) 08/12/2014 0858   PLT 328 07/12/2023 0243   PLT 282 08/12/2014 0858    BMET    Component Value Date/Time   NA 138 07/12/2023 0243   NA 132 (L) 08/12/2014 0858   K 3.7 07/12/2023 0243   K 4.0 08/12/2014 0858   CL 107 07/12/2023 0243   CL 100 (L) 08/12/2014 0858   CO2 20 (L) 07/12/2023 0243   CO2 24 08/12/2014 0858   GLUCOSE 138 (H) 07/12/2023 0243   GLUCOSE 118 (H) 08/12/2014 0858   BUN 28 (H) 07/12/2023 0243   BUN 30 (H) 08/12/2014 0858   CREATININE 1.26 (H) 07/12/2023 0243   CREATININE 1.52 (H) 08/12/2014 0858   CALCIUM 8.3 (L) 07/12/2023 0243   CALCIUM 8.9 08/12/2014 0858   GFRNONAA >60 07/12/2023 0243   GFRNONAA 49 (L) 08/12/2014 0858   GFRAA >60 01/10/2020 0940   GFRAA 57 (L) 08/12/2014 0858    Assessment/Planning: Gangrene left forefoot Severe peripheral vascular disease status post bypass  Patient will need transmetatarsal amputation of the left foot.  Once medically  stable will likely plan at the end of the week. Discussed the surgery with the patient and wife today. Discussed need for minimal weightbearing only heel weightbearing for transfers otherwise nonweightbearing to the left foot. I will follow-up later this week for final decision on surgery.  Will tentatively plan for Friday.  Gwyneth Revels A  07/12/2023, 12:34 PM

## 2023-07-12 NOTE — Anesthesia Postprocedure Evaluation (Signed)
 Anesthesia Post Note  Patient: [name] BETTS [name]  Procedure(s) Performed: CREATION, BYPASS, ARTERIAL, FEMORAL TO TIBIAL, USING GRAFT, INSITU (Left: Groin) APPLICATION OF CELL SAVER (Left: Groin) DILATION, URETHRA, WITH GENERAL ANESTHESIA, DIFFICULT CATHETER INSERTION (Penis)  Patient location during evaluation: ICU Anesthesia Type: General Level of consciousness: awake Respiratory status: spontaneous breathing Cardiovascular status: stable Anesthetic complications: no   No notable events documented.   Last Vitals:  Vitals:   07/12/23 0500 07/12/23 0600  BP: 108/62 116/72  Pulse: 70 81  Resp: 16 (!) 27  Temp:    SpO2: 100% 100%    Last Pain:  Vitals:   07/12/23 0452  TempSrc:   PainSc: Asleep                 Jaye Beagle

## 2023-07-12 NOTE — Progress Notes (Signed)
 Progress Note    07/12/2023 7:16 AM 1 Day Post-Op  Subjective:  [name] [name] is a 68 yo male now POD #1 from Left femoral artery to posterior tibial artery bypass with in-situ saphenous vein graft and Left profunda femoris endarterectomy with bovine patch angioplasty.   On exam this morning patient is resting comfortably in bed. Recovering as expected. Endorses soreness to his left lower extremity. No complaints overnight and vitals all remain stable.    Vitals:   07/12/23 0500 07/12/23 0600  BP: 108/62 116/72  Pulse: 70 81  Resp: 16 (!) 27  Temp:    SpO2: 100% 100%   Physical Exam: Cardiac:  RRR, normal S1 and S2.  No murmurs appreciated. Lungs: Lungs clear on auscultation throughout.  No rales rhonchi or wheezing. Incisions: Right groin and left leg with dressing clean dry and intact.  No hematoma seroma or infection to note. Extremities: Right lower extremity is warm to touch with Doppler PT pulse only.  Left lower extremity with doppler DP and PT pulses and left lower extremity feels warm today. Abdomen: Positive bowel sounds throughout, soft, nontender nondistended. Neurologic: AAOX3, answers all questions and follows commands appropriately.  CBC    Component Value Date/Time   WBC 17.1 (H) 07/12/2023 0243   RBC 3.28 (L) 07/12/2023 0243   HGB 8.5 (L) 07/12/2023 0243   HGB 11.7 (L) 08/12/2014 0858   HCT 26.6 (L) 07/12/2023 0243   HCT 37.1 (L) 08/12/2014 0858   PLT 328 07/12/2023 0243   PLT 282 08/12/2014 0858   MCV 81.1 07/12/2023 0243   MCV 72 (L) 08/12/2014 0858   MCH 25.9 (L) 07/12/2023 0243   MCHC 32.0 07/12/2023 0243   RDW 15.0 07/12/2023 0243   RDW 20.6 (H) 08/12/2014 0858   LYMPHSABS 0.8 07/04/2023 1318   LYMPHSABS 0.3 (L) 08/12/2014 0858   MONOABS 0.8 07/04/2023 1318   MONOABS 0.7 08/12/2014 0858   EOSABS 0.2 07/04/2023 1318   EOSABS 0.2 08/12/2014 0858   BASOSABS 0.0 07/04/2023 1318   BASOSABS 0.0 08/12/2014 0858    BMET    Component Value  Date/Time   NA 138 07/12/2023 0243   NA 132 (L) 08/12/2014 0858   K 3.7 07/12/2023 0243   K 4.0 08/12/2014 0858   CL 107 07/12/2023 0243   CL 100 (L) 08/12/2014 0858   CO2 20 (L) 07/12/2023 0243   CO2 24 08/12/2014 0858   GLUCOSE 138 (H) 07/12/2023 0243   GLUCOSE 118 (H) 08/12/2014 0858   BUN 28 (H) 07/12/2023 0243   BUN 30 (H) 08/12/2014 0858   CREATININE 1.26 (H) 07/12/2023 0243   CREATININE 1.52 (H) 08/12/2014 0858   CALCIUM 8.3 (L) 07/12/2023 0243   CALCIUM 8.9 08/12/2014 0858   GFRNONAA >60 07/12/2023 0243   GFRNONAA 49 (L) 08/12/2014 0858   GFRAA >60 01/10/2020 0940   GFRAA 57 (L) 08/12/2014 0858    INR    Component Value Date/Time   INR 1.3 (H) 07/11/2023 0530     Intake/Output Summary (Last 24 hours) at 07/12/2023 0716 Last data filed at 07/12/2023 0359 Gross per 24 hour  Intake 1150 ml  Output 1265 ml  Net -115 ml     Assessment/Plan:  68 y.o. male is s/p Left femoral artery to posterior tibial artery bypass with in-situ saphenous vein graft and Left profunda femoris endarterectomy with bovine patch angioplasty.  1 Day Post-Op   Patient had a traumatic and difficult foley catheter insertion done by urology with dilation. Urology  recommendation to leave foley catheter for 7 days before trial to urinate.   PLAN OOB to chair most of the day PT/OT Consults ASA 81 mg and Plavix 75 mg daily.  Leave foley catheter in place for 7 days.  Transfer to floor tomorrow.   DVT prophylaxis:  Heparin 5000 units SQ Q 8hrs.    Marcie [facility] 07/12/2023 7:16 AM

## 2023-07-12 NOTE — Progress Notes (Signed)
 Progress Note   [name] [name]: [name] [name] MWU:[id number] DOB: 1955-10-06 DOA: 07/04/2023     8 DOS: the [name] [name] was seen and examined on 07/12/2023   Brief hospital course:  [name] [name], [name] is a 68 year old male with history of non-insulin-dependent diabetes mellitus, has since stopped taking metformin, hypertension, hyperlipidemia, who presents emergency department for chief concerns of black toes on the left foot. He reports that he has noticed his toes bothering him and hurting with ambulation for about 1.5 months.  Denies any pain at rest, it hurts only when he is ambulating or putting weight on his left foot.   Vitals in the ED showed temperature of 99, respiration rate of 15, heart rate 70, blood pressure 129/80, SpO2 97% on room air.   Serum sodium is 141, potassium 3.9, chloride 107, bicarb 23, BUN of 24, serum creatinine of 1.26, EGFR greater than 60, nonfasting blood glucose 128, WBC 10.6, hemoglobin 10.8, platelets of 334.   Lactic acid is 0.9.   ED treatment: Zosyn, vancomycin.     Assessment and Plan:  # Left Foot osteomyelitis Blood culture NGTD Continue Unasyn and Zyvox Appreciate podiatry input, they recommend a left TMA which will be done Friday, 3/28   # Severe PAD s/p LLE Bypass done on 07/11/2023 Appreciate vascular surgery input, [name] [name] is status post left lower extremity angiogram 03/19 with plans for left lower extremity bypass surgery which was done on 03/24   Cardiomyopathy Chronic systolic CHF, compensated. [name] [name] had complete echo on 05/08/2022: With estimated EF  < 20% Does not appear to be in acute exacerbation at this time Continue carvedilol with holding parameters as well as spironolactone   Diabetes mellitus (HCC)  HbA1c 6.3, well-controlled [name] [name] has stopped taking metformin per outpatient provider Continue NovoLog sliding scale Continue consistent carbohydrate diet   History of rectal cancer Continue outpatient follow-up  with hematology/oncology   HTN (hypertension) Continue carvedilol and spironolactone Use hydralazine as needed Monitor BP and titrate medications accordingly    AKI Improved renal function Serum creatinine has improved from 1.40 >> 1.13 Cr 1.32 today  Hypokalemia, potassium repleted.  Diarrhea Probably related to antibiotic therapy May use Imodium as needed Advised to eat yogurt for natural probiotics  Hematuria developed last night on 3/22 Hb 9.8 today H&H fairly stable US renal: Possible blood clot in the bladder. Difficult to exclude malignancy.  5 mm nonobstructing right renal stone. Bilateral renal cysts. Hematuria possible due to bilateral renal cyst [name] [name] may need Foley catheter if urinary retention Needs to follow-up with urology    History of gout, c/o left knee gout flareup on 3/23 Left knee noticed swollen on 3/23 Resumed colchicine 0.6 mg p.o. daily home dose  Procedures: 07/11/2023: LLE bypass surgery: 1. Left femoral artery to posterior tibial artery bypass with in-situ saphenous vein graft 2. Left profunda femoris endarterectomy with bovine patch angioplasty separate and distinct   Subjective: No significant events overnight, [name] [name] was lying comfortably in the recliner, left knee pain is improving, very little pain in the foot.  Denied any other active issues.    Physical Exam: Vitals:   07/12/23 1300 07/12/23 1400 07/12/23 1500 07/12/23 1515  BP: (!) 89/63 98/63 116/73 99/72  Pulse: 71 71 70 64  Resp: 16 18 (!) 22 (!) 24  Temp:      TempSrc:      SpO2: 100% 100% 100% 100%  Weight:      Height:       General: NAD, lying  comfortably Appear in no distress, affect appropriate Eyes: PERRLA ENT: Oral Mucosa Clear, moist  Neck: no JVD,  Cardiovascular: S1 and S2 Present, no Murmur,  Respiratory: good respiratory effort, Bilateral Air entry equal and Decreased, no Crackles, no wheezes Abdomen: Bowel Sound present, Soft and no tenderness,   Skin: no rashes Extremities: s/p LLE Bypass, Honeycomb dressing attached.  Left second third and fourth toe gangrenous Neurologic: without any new focal findings Gait not checked due to [name] [name] safety concerns   Data Reviewed:  Labs reviewed  Family Communication: Plan of care discussed with [name] [name] at the bedside.  All questions and concerns have been addressed.  He verbalizes understanding and agrees with the plan.  Disposition: Status is: Inpatient Remains inpatient appropriate because: Scheduled for left lower extremity bypass surgery on 03/24  Planned Discharge Destination:  TBD    Time spent: 55  minutes  Author: Gillis Santa, MD 07/12/2023 3:29 PM  For on call review [email or web address].

## 2023-07-12 NOTE — Evaluation (Signed)
 Physical Therapy Evaluation Patient Details Name: [name] MCWHERTER [name] MRN: [id number] DOB: Mar 20, 1956 Today's Date: 07/12/2023  History of Present Illness  68 y/o male presented to ED on 07/04/23 for gangrenous toes on L foot. S/p L LE angiography and stent placement on 3/19. S/p left femoral distal bypass with profunda femoral endarterectomy on 3/24. PMH: T2DM, HTN  Clinical Impression  Patient admitted with the above. PTA, patient lives with wife and reports he was using rollator for mobility in the home and [facility] for community. Complaining of L foot pain and discomfort throughout session. Treated as heel WB on L during session. Required modA for bed mobility and sit to stand. Transferred to recliner with minA for RW management. Patient will benefit from skilled PT services during acute stay to address listed deficits. Patient will benefit from ongoing therapy at discharge to maximize functional independence and safety.         If plan is discharge home, recommend the following: A little help with walking and/or transfers;A little help with bathing/dressing/bathroom;Assistance with cooking/housework;Assist for transportation;Help with stairs or ramp for entrance   Can travel by private vehicle        Equipment Recommendations Rolling Avaleigh Decuir (2 wheels);BSC/3in1  Recommendations for Other Services       Functional Status Assessment Patient has had a recent decline in their functional status and demonstrates the ability to make significant improvements in function in a reasonable and predictable amount of time.     Precautions / Restrictions Precautions Precautions: Fall Recall of Precautions/Restrictions: Intact Restrictions Weight Bearing Restrictions Per Provider Order: No Other Position/Activity Restrictions: treated as heel WB on L due to necrotic toes      Mobility  Bed Mobility Overal bed mobility: Needs Assistance Bed Mobility: Supine to Sit     Supine to sit: Mod  assist          Transfers Overall transfer level: Needs assistance Equipment used: Rolling Jevonte Clanton (2 wheels) Transfers: Sit to/from Stand, Bed to chair/wheelchair/BSC Sit to Stand: Mod assist   Step pivot transfers: Min assist       General transfer comment: assist to boost into standing and RW advancement to transfer to recliner. Heavy use of RW due to L foot discomfort    Ambulation/Gait                  Stairs            Wheelchair Mobility     Tilt Bed    Modified Rankin (Stroke Patients Only)       Balance Overall balance assessment: Needs assistance Sitting-balance support: No upper extremity supported, Feet supported Sitting balance-Leahy Scale: Fair     Standing balance support: Bilateral upper extremity supported, Reliant on assistive device for balance Standing balance-Leahy Scale: Poor Standing balance comment: reliant on RW                             Pertinent Vitals/Pain Pain Assessment Pain Assessment: Faces Faces Pain Scale: Hurts even more Pain Location: L foot Pain Descriptors / Indicators: Grimacing, Guarding, Sore Pain Intervention(s): Monitored during session, Limited activity within patient's tolerance, Repositioned    Home Living Family/patient expects to be discharged to:: Private residence Living Arrangements: Spouse/significant other Available Help at Discharge: Family;Available 24 hours/day Type of Home: House Home Access: Stairs to enter Entrance Stairs-Rails: Left Entrance Stairs-Number of Steps: 2-3   Home Layout: One level Home Equipment: Rollator (4 wheels);Cane -  single point      Prior Function Prior Level of Function : Independent/Modified Independent             Mobility Comments: reports using rollator in the home and [facility] in community       Extremity/Trunk Assessment   Upper Extremity Assessment Upper Extremity Assessment: Generalized weakness    Lower Extremity  Assessment Lower Extremity Assessment: Generalized weakness       Communication   Communication Communication: No apparent difficulties    Cognition Arousal: Alert Behavior During Therapy: WFL for tasks assessed/performed   PT - Cognitive impairments: No apparent impairments                         Following commands: Intact       Cueing       General Comments      Exercises     Assessment/Plan    PT Assessment Patient needs continued PT services  PT Problem List Decreased strength;Decreased activity tolerance;Decreased balance;Decreased mobility;Decreased safety awareness;Decreased knowledge of use of DME;Decreased knowledge of precautions;Cardiopulmonary status limiting activity;Pain       PT Treatment Interventions DME instruction;Gait training;Functional mobility training;Therapeutic activities;Neuromuscular re-education;Balance training;Therapeutic exercise;Stair training;Patient/family education    PT Goals (Current goals can be found in the Care Plan section)  Acute Rehab PT Goals Patient Stated Goal: to reduce pain PT Goal Formulation: With patient Time For Goal Achievement: 07/26/23 Potential to Achieve Goals: Good    Frequency Min 3X/week     Co-evaluation               AM-PAC PT "6 Clicks" Mobility  Outcome Measure Help needed turning from your back to your side while in a flat bed without using bedrails?: A Lot Help needed moving from lying on your back to sitting on the side of a flat bed without using bedrails?: A Lot Help needed moving to and from a bed to a chair (including a wheelchair)?: A Lot Help needed standing up from a chair using your arms (e.g., wheelchair or bedside chair)?: A Lot Help needed to walk in hospital room?: A Lot Help needed climbing 3-5 steps with a railing? : A Lot 6 Click Score: 12    End of Session   Activity Tolerance: Patient tolerated treatment well Patient left: in chair;with call bell/phone  within reach;with family/visitor present Nurse Communication: Mobility status PT Visit Diagnosis: Other abnormalities of gait and mobility (R26.89);Unsteadiness on feet (R26.81);Muscle weakness (generalized) (M62.81)    Time: 0981-1914 PT Time Calculation (min) (ACUTE ONLY): 27 min   Charges:   PT Evaluation $PT Eval Moderate Complexity: 1 Mod PT Treatments $Therapeutic Activity: 8-22 mins PT General Charges $$ ACUTE PT VISIT: 1 Visit         Maylon Peppers, PT, DPT Physical Therapist - [facility]  [facility]   Giorgia Wahler A Kayin Osment 07/12/2023, 12:38 PM

## 2023-07-12 NOTE — Plan of Care (Signed)
  Problem: Education: Goal: Ability to describe self-care measures that may prevent or decrease complications (Diabetes Survival Skills Education) will improve Outcome: Progressing   Problem: Coping: Goal: Ability to adjust to condition or change in health will improve Outcome: Progressing   Problem: Fluid Volume: Goal: Ability to maintain a balanced intake and output will improve Outcome: Progressing   Problem: Health Behavior/Discharge Planning: Goal: Ability to manage health-related needs will improve Outcome: Progressing   Problem: Metabolic: Goal: Ability to maintain appropriate glucose levels will improve Outcome: Progressing   

## 2023-07-12 NOTE — Plan of Care (Signed)
  Problem: Coping: Goal: Ability to adjust to condition or change in health will improve Outcome: Progressing   Problem: Fluid Volume: Goal: Ability to maintain a balanced intake and output will improve Outcome: Progressing   Problem: Health Behavior/Discharge Planning: Goal: Ability to identify and utilize available resources and services will improve Outcome: Progressing   Problem: Nutritional: Goal: Maintenance of adequate nutrition will improve Outcome: Progressing   Problem: Tissue Perfusion: Goal: Adequacy of tissue perfusion will improve Outcome: Progressing   Problem: Education: Goal: Knowledge of General Education information will improve Description: Including pain rating scale, medication(s)/side effects and non-pharmacologic comfort measures Outcome: Progressing   Problem: Clinical Measurements: Goal: Diagnostic test results will improve Outcome: Progressing Goal: Respiratory complications will improve Outcome: Progressing Goal: Cardiovascular complication will be avoided Outcome: Progressing   Problem: Activity: Goal: Risk for activity intolerance will decrease Outcome: Progressing

## 2023-07-13 DIAGNOSIS — M869 Osteomyelitis, unspecified: Secondary | ICD-10-CM | POA: Diagnosis not present

## 2023-07-13 LAB — BASIC METABOLIC PANEL
Anion gap: 8 (ref 5–15)
BUN: 45 mg/dL — ABNORMAL HIGH (ref 8–23)
CO2: 22 mmol/L (ref 22–32)
Calcium: 8.7 mg/dL — ABNORMAL LOW (ref 8.9–10.3)
Chloride: 108 mmol/L (ref 98–111)
Creatinine, Ser: 1.52 mg/dL — ABNORMAL HIGH (ref 0.61–1.24)
GFR, Estimated: 49 mL/min — ABNORMAL LOW (ref >60)
Glucose, Bld: 144 mg/dL — ABNORMAL HIGH (ref 70–99)
Potassium: 3.9 mmol/L (ref 3.5–5.1)
Sodium: 138 mmol/L (ref 135–145)

## 2023-07-13 LAB — CBC
HCT: 26.2 % — ABNORMAL LOW (ref 39.0–52.0)
Hemoglobin: 8.3 g/dL — ABNORMAL LOW (ref 13.0–17.0)
MCH: 26.3 pg (ref 26.0–34.0)
MCHC: 31.7 g/dL (ref 30.0–36.0)
MCV: 82.9 fL (ref 80.0–100.0)
Platelets: 413 10*3/uL — ABNORMAL HIGH (ref 150–400)
RBC: 3.16 MIL/uL — ABNORMAL LOW (ref 4.22–5.81)
RDW: 15.2 % (ref 11.5–15.5)
WBC: 12.4 10*3/uL — ABNORMAL HIGH (ref 4.0–10.5)
nRBC: 0 % (ref 0.0–0.2)

## 2023-07-13 LAB — SURGICAL PATHOLOGY

## 2023-07-13 LAB — GLUCOSE, CAPILLARY
Glucose-Capillary: 106 mg/dL — ABNORMAL HIGH (ref 70–99)
Glucose-Capillary: 113 mg/dL — ABNORMAL HIGH (ref 70–99)
Glucose-Capillary: 117 mg/dL — ABNORMAL HIGH (ref 70–99)

## 2023-07-13 MED ORDER — DOCUSATE SODIUM 100 MG PO CAPS
100.0000 mg | ORAL_CAPSULE | Freq: Two times a day (BID) | ORAL | Status: AC | PRN
Start: 2023-07-13 — End: ?

## 2023-07-13 MED ORDER — DOCUSATE SODIUM 100 MG PO CAPS
100.0000 mg | ORAL_CAPSULE | Freq: Two times a day (BID) | ORAL | Status: DC
  Administered 2023-07-13 – 2023-07-19 (×8): 100 mg via ORAL
  Filled 2023-07-13 (×13): qty 1

## 2023-07-13 MED ORDER — POLYETHYLENE GLYCOL 3350 17 G PO PACK: 17.0000 g | PACK | Freq: Every day | ORAL | Status: DC | PRN

## 2023-07-13 MED ORDER — POLYETHYLENE GLYCOL 3350 17 G PO PACK
17.0000 g | PACK | Freq: Every day | ORAL | Status: DC
  Administered 2023-07-13 – 2023-07-18 (×2): 17 g via ORAL
  Filled 2023-07-13 (×4): qty 1

## 2023-07-13 MED ORDER — SPIRONOLACTONE 12.5 MG HALF TABLET
12.5000 mg | ORAL_TABLET | Freq: Every day | ORAL | Status: DC
  Administered 2023-07-14 – 2023-07-16 (×3): 12.5 mg via ORAL
  Filled 2023-07-13 (×5): qty 1

## 2023-07-13 NOTE — Progress Notes (Addendum)
 Progress Note   Patient: [name] [name] ZOX:[id number] DOB: 09-30-1955 DOA: 07/04/2023     9 DOS: the patient was seen and examined on 07/13/2023   Brief hospital course:  [name], [name] is a 68 year old male with history of non-insulin-dependent diabetes mellitus, has since stopped taking metformin, hypertension, hyperlipidemia, who presents emergency department for chief concerns of black toes on the left foot. He reports that he has noticed his toes bothering him and hurting with ambulation for about 1.5 months.  Denies any pain at rest, it hurts only when he is ambulating or putting weight on his left foot.   Vitals in the ED showed temperature of 99, respiration rate of 15, heart rate 70, blood pressure 129/80, SpO2 97% on room air.   Serum sodium is 141, potassium 3.9, chloride 107, bicarb 23, BUN of 24, serum creatinine of 1.26, EGFR greater than 60, nonfasting blood glucose 128, WBC 10.6, hemoglobin 10.8, platelets of 334.   Lactic acid is 0.9.   ED treatment: Zosyn, vancomycin.     Assessment and Plan:  # Left Foot osteomyelitis and gangrene of second, third and fourth toes Blood culture NGTD Continue Unasyn and Zyvox Appreciate podiatry input, they recommend a left TMA which will be done Friday, 3/28   # Severe PAD s/p LLE Bypass done on 07/11/2023 Appreciate vascular surgery input, patient is status post left lower extremity angiogram 03/19 with plans for left lower extremity bypass surgery which was done on 03/24   # Cardiomyopathy # Chronic systolic CHF, compensated. Patient had complete echo on 05/08/2022: With estimated EF  < 20% Does not appear to be in acute exacerbation at this time Continue carvedilol with holding parameters as well as spironolactone   # Diabetes mellitus (HCC)  HbA1c 6.3, well-controlled Patient has stopped taking metformin per outpatient provider Continue NovoLog sliding scale Continue consistent carbohydrate diet   #  History of rectal cancer Continue outpatient follow-up with hematology/oncology   # HTN (hypertension) Continue carvedilol and spironolactone Use hydralazine as needed Monitor BP and titrate medications accordingly    # AKI Improved renal function Serum creatinine has improved from 1.40 >> 1.13 3/26 Cr 1.52 elevated, continue to monitor, held Aldactone  # Hypokalemia, potassium repleted.  Resolved  # Diarrhea Probably related to antibiotic therapy May use Imodium as needed Advised to eat yogurt for natural probiotics  # Hematuria developed last night on 3/22 3/26 Hb 8.3 fairly stable H&H fairly stable US renal: Possible blood clot in the bladder. Difficult to exclude malignancy.  5 mm nonobstructing right renal stone. Bilateral renal cysts. Hematuria possible due to bilateral renal cyst Patient may need Foley catheter if urinary retention Needs to follow-up with urology   # History of gout, c/o left knee gout flareup on 3/23 Left knee noticed swollen on 3/23 Resumed colchicine 0.6 mg p.o. daily home dose  Procedures: 07/11/2023: LLE bypass surgery: 1. Left femoral artery to posterior tibial artery bypass with in-situ saphenous vein graft 2. Left profunda femoris endarterectomy with bovine patch angioplasty separate and distinct   Subjective: No significant events overnight, patient was laying in the bed comfortably, denied any complaints, pain is under control. Patient is aware that podiatry has scheduled surgery on Friday.    Physical Exam: Vitals:   07/13/23 1100 07/13/23 1200 07/13/23 1300 07/13/23 1400  BP: 105/72 114/72 107/70 101/63  Pulse: 65 66 67 72  Resp: 19 20 18 19   Temp:  97.9 F (36.6 C)    TempSrc:  Oral    SpO2: 100% 100% 99% 100%  Weight:      Height:       General: NAD, lying comfortably Appear in no distress, affect appropriate Eyes: PERRLA ENT: Oral Mucosa Clear, moist  Neck: no JVD,  Cardiovascular: S1 and S2 Present, no Murmur,   Respiratory: good respiratory effort, Bilateral Air entry equal and Decreased, no Crackles, no wheezes Abdomen: Bowel Sound present, Soft and no tenderness,  Skin: no rashes Extremities: s/p LLE Bypass, Honeycomb dressing attached.  Left second third and fourth toe gangrenous Neurologic: without any new focal findings Gait not checked due to patient safety concerns   Data Reviewed:  Labs reviewed  Family Communication: Plan of care discussed with patient at the bedside.  All questions and concerns have been addressed.  He verbalizes understanding and agrees with the plan.  Disposition: Status is: Inpatient Remains inpatient appropriate because: Scheduled for left lower extremity bypass surgery on 03/24  Planned Discharge Destination:  TBD    Time spent: 40  minutes  Author: Gillis Santa, MD 07/13/2023 3:19 PM  For on call review [email or web address].

## 2023-07-13 NOTE — Progress Notes (Signed)
 Progress Note    07/13/2023 7:17 AM 2 Days Post-Op  Subjective:  [name] [name] is a 68 yo male now POD #2 from Left femoral artery to posterior tibial artery bypass with in-situ saphenous vein graft and Left profunda femoris endarterectomy with bovine patch angioplasty.    On exam this morning patient is resting comfortably in bed. Recovering as expected. Endorses soreness to his left lower extremity. No complaints overnight and vitals all remain stable.    Vitals:   07/13/23 0500 07/13/23 0600  BP: (!) 96/59 94/62  Pulse: 88 77  Resp: 20 17  Temp:    SpO2: 97% 96%   Physical Exam: Cardiac:  RRR, normal S1 and S2.  No murmurs appreciated. Lungs: Lungs clear on auscultation throughout.  No rales rhonchi or wheezing. Incisions: Right groin and left leg with dressing clean dry and intact.  No hematoma seroma or infection to note. Extremities: Right lower extremity is warm to touch with Doppler PT pulse only.  Left lower extremity with doppler DP and PT pulses and left lower extremity feels warm today. Abdomen: Positive bowel sounds throughout, soft, nontender nondistended. Neurologic: AAOX3, answers all questions and follows commands appropriately. CBC    Component Value Date/Time   WBC 12.4 (H) 07/13/2023 0404   RBC 3.16 (L) 07/13/2023 0404   HGB 8.3 (L) 07/13/2023 0404   HGB 11.7 (L) 08/12/2014 0858   HCT 26.2 (L) 07/13/2023 0404   HCT 37.1 (L) 08/12/2014 0858   PLT 413 (H) 07/13/2023 0404   PLT 282 08/12/2014 0858   MCV 82.9 07/13/2023 0404   MCV 72 (L) 08/12/2014 0858   MCH 26.3 07/13/2023 0404   MCHC 31.7 07/13/2023 0404   RDW 15.2 07/13/2023 0404   RDW 20.6 (H) 08/12/2014 0858   LYMPHSABS 0.8 07/04/2023 1318   LYMPHSABS 0.3 (L) 08/12/2014 0858   MONOABS 0.8 07/04/2023 1318   MONOABS 0.7 08/12/2014 0858   EOSABS 0.2 07/04/2023 1318   EOSABS 0.2 08/12/2014 0858   BASOSABS 0.0 07/04/2023 1318   BASOSABS 0.0 08/12/2014 0858    BMET    Component Value  Date/Time   NA 138 07/13/2023 0404   NA 132 (L) 08/12/2014 0858   K 3.9 07/13/2023 0404   K 4.0 08/12/2014 0858   CL 108 07/13/2023 0404   CL 100 (L) 08/12/2014 0858   CO2 22 07/13/2023 0404   CO2 24 08/12/2014 0858   GLUCOSE 144 (H) 07/13/2023 0404   GLUCOSE 118 (H) 08/12/2014 0858   BUN 45 (H) 07/13/2023 0404   BUN 30 (H) 08/12/2014 0858   CREATININE 1.52 (H) 07/13/2023 0404   CREATININE 1.52 (H) 08/12/2014 0858   CALCIUM 8.7 (L) 07/13/2023 0404   CALCIUM 8.9 08/12/2014 0858   GFRNONAA 49 (L) 07/13/2023 0404   GFRNONAA 49 (L) 08/12/2014 0858   GFRAA >60 01/10/2020 0940   GFRAA 57 (L) 08/12/2014 0858    INR    Component Value Date/Time   INR 1.3 (H) 07/11/2023 0530     Intake/Output Summary (Last 24 hours) at 07/13/2023 0717 Last data filed at 07/13/2023 0600 Gross per 24 hour  Intake 880.12 ml  Output 1375 ml  Net -494.88 ml     Assessment/Plan:  68 y.o. male is s/p Left femoral artery to posterior tibial artery bypass with in-situ saphenous vein graft and Left profunda femoris endarterectomy with bovine patch angioplasty.  2 Days Post-Op   Patient had a traumatic and difficult foley catheter insertion done by urology with dilation. Urology  recommendation to leave foley catheter for 7 days before trial to urinate.   PLAN Transfer to floor today. OOB to chair most of the day Continue to work with PT/OT  ASA 81 mg and Plavix 75 mg daily.  Leave foley catheter in place for 7 days.    DVT prophylaxis:  Heparin 5000 units SQ Q 8hrs.    Marcie Bal [facility] 07/13/2023 7:17 AM

## 2023-07-14 DIAGNOSIS — M869 Osteomyelitis, unspecified: Secondary | ICD-10-CM | POA: Diagnosis not present

## 2023-07-14 LAB — BASIC METABOLIC PANEL WITH GFR
Anion gap: 12 (ref 5–15)
BUN: 41 mg/dL — ABNORMAL HIGH (ref 8–23)
CO2: 22 mmol/L (ref 22–32)
Calcium: 8.6 mg/dL — ABNORMAL LOW (ref 8.9–10.3)
Chloride: 107 mmol/L (ref 98–111)
Creatinine, Ser: 1.36 mg/dL — ABNORMAL HIGH (ref 0.61–1.24)
GFR, Estimated: 56 mL/min — ABNORMAL LOW (ref >60)
Glucose, Bld: 108 mg/dL — ABNORMAL HIGH (ref 70–99)
Potassium: 3.5 mmol/L (ref 3.5–5.1)
Sodium: 141 mmol/L (ref 135–145)

## 2023-07-14 LAB — CBC
HCT: 24 % — ABNORMAL LOW (ref 39.0–52.0)
Hemoglobin: 7.5 g/dL — ABNORMAL LOW (ref 13.0–17.0)
MCH: 26 pg (ref 26.0–34.0)
MCHC: 31.3 g/dL (ref 30.0–36.0)
MCV: 83 fL (ref 80.0–100.0)
Platelets: 334 10*3/uL (ref 150–400)
RBC: 2.89 MIL/uL — ABNORMAL LOW (ref 4.22–5.81)
RDW: 15.4 % (ref 11.5–15.5)
WBC: 10.4 10*3/uL (ref 4.0–10.5)
nRBC: 0 % (ref 0.0–0.2)

## 2023-07-14 LAB — GLUCOSE, CAPILLARY
Glucose-Capillary: 116 mg/dL — ABNORMAL HIGH (ref 70–99)
Glucose-Capillary: 95 mg/dL (ref 70–99)
Glucose-Capillary: 90 mg/dL (ref 70–99)
Glucose-Capillary: 100 mg/dL — ABNORMAL HIGH (ref 70–99)
Glucose-Capillary: 97 mg/dL (ref 70–99)

## 2023-07-14 MED ORDER — CEFAZOLIN SODIUM-DEXTROSE 2-4 GM/100ML-% IV SOLN
2.0000 g | INTRAVENOUS | Status: AC
  Administered 2023-07-15: 2 g via INTRAVENOUS
  Filled 2023-07-14: qty 100

## 2023-07-14 MED ORDER — CHLORHEXIDINE GLUCONATE 4 % EX SOLN
60.0000 mL | Freq: Once | CUTANEOUS | Status: AC
  Administered 2023-07-15: 4 via TOPICAL

## 2023-07-14 MED ORDER — POVIDONE-IODINE 10 % EX SWAB: 2.0000 | Freq: Once | CUTANEOUS | Status: DC

## 2023-07-14 MED ORDER — POTASSIUM CHLORIDE CRYS ER 20 MEQ PO TBCR
20.0000 meq | EXTENDED_RELEASE_TABLET | Freq: Once | ORAL | Status: AC
  Administered 2023-07-14: 20 meq via ORAL
  Filled 2023-07-14: qty 1

## 2023-07-14 NOTE — Plan of Care (Signed)

## 2023-07-14 NOTE — Progress Notes (Signed)
 07/14/23 1530  PT Visit Information  Assistance Needed +1  History of Present Illness 68 y/o male presented to ED on 07/04/23 for gangrenous toes on L foot. S/p L LE angiography and stent placement on 3/19. S/p left femoral distal bypass with profunda femoral endarterectomy on 3/24. PMH: T2DM, HTN  Subjective Data  Patient Stated Goal to reduce pain  Precautions  Precautions Fall  Recall of Precautions/Restrictions Intact  Restrictions  Weight Bearing Restrictions Per Provider Order No  Other Position/Activity Restrictions treated as heel WB on L due to necrotic toes  Pain Assessment  Pain Assessment Faces  Faces Pain Scale 4  Pain Location L foot  Pain Descriptors / Indicators Grimacing;Guarding;Sore  Pain Intervention(s) Limited activity within patient's tolerance  Cognition  Arousal Alert  Behavior During Therapy WFL for tasks assessed/performed  PT - Cognitive impairments No apparent impairments  PT - Cognition Comments wife present to provide previous level  Following Commands  Following commands Intact  Bed Mobility  Overal bed mobility Needs Assistance  Bed Mobility Supine to Sit  Supine to sit Mod assist  Transfers  Overall transfer level Needs assistance  Equipment used Rolling walker (2 wheels)  Transfers Sit to/from Stand;Bed to chair/wheelchair/BSC  Sit to Stand Max assist  General transfer comment ModA to turn and transfer to bedside chair due to flexed posture and poor tolerance for L heel wt bearing  Ambulation/Gait  General Gait Details  (non-ambulatory)  Balance  Overall balance assessment Needs assistance  Sitting-balance support No upper extremity supported;Feet supported  Sitting balance-Leahy Scale Fair  Standing balance support Bilateral upper extremity supported;Reliant on assistive device for balance  Standing balance-Leahy Scale Poor  Exercises  Exercises General Lower Extremity  General Exercises - Lower Extremity  Ankle Circles/Pumps  AROM;Both;10 reps  Long Arc Quad AROM;Both;10 reps  Hip Flexion/Marching AROM;Both;10 reps  PT - End of Session  Equipment Utilized During Treatment Gait belt  Activity Tolerance Patient tolerated treatment well  Patient left in chair;with call bell/phone within reach;with family/visitor present  Nurse Communication Mobility status   PT - Assessment/Plan  PT Visit Diagnosis Other abnormalities of gait and mobility (R26.89);Unsteadiness on feet (R26.81);Muscle weakness (generalized) (M62.81)  PT Frequency (ACUTE ONLY) Min 3X/week  Follow Up Recommendations Home health PT  Patient can return home with the following A little help with walking and/or transfers;A little help with bathing/dressing/bathroom;Assistance with cooking/housework;Assist for transportation;Help with stairs or ramp for entrance  PT equipment Rolling walker (2 wheels);BSC/3in1  AM-PAC PT "6 Clicks" Mobility Outcome Measure (Version 2)  Help needed turning from your back to your side while in a flat bed without using bedrails? 2  Help needed moving from lying on your back to sitting on the side of a flat bed without using bedrails? 2  Help needed moving to and from a bed to a chair (including a wheelchair)? 2  Help needed standing up from a chair using your arms (e.g., wheelchair or bedside chair)? 2  Help needed to walk in hospital room? 2  Help needed climbing 3-5 steps with a railing?  2  6 Click Score 12  Consider Recommendation of Discharge To: CIR/SNF/LTACH  Progressive Mobility  What is the highest level of mobility based on the progressive mobility assessment? Level 2 (Chairfast) - Balance while sitting on edge of bed and cannot stand  Activity Transferred from bed to chair  PT Goal Progression  Progress towards PT goals Progressing toward goals  PT Time Calculation  PT Start Time (ACUTE  ONLY) 1445  PT Stop Time (ACUTE ONLY) 1509  PT Time Calculation (min) (ACUTE ONLY) 24 min  PT General Charges  $$ ACUTE PT  VISIT 1 Visit  PT Treatments  $Therapeutic Exercise 8-22 mins  $Therapeutic Activity 8-22 mins  Zadie Cleverly, PTA

## 2023-07-14 NOTE — Progress Notes (Signed)
 Progress Note   Patient: [name] [name] ZOX:[id number] DOB: [birth year] DOA: 07/04/2023     10 DOS: the patient was seen and examined on 07/14/2023   Brief hospital course:  [name] [name], [name] is a 68 year old male with history of non-insulin-dependent diabetes mellitus, has since stopped taking metformin, hypertension, hyperlipidemia, who presents emergency department for chief concerns of black toes on the left foot. He reports that he has noticed his toes bothering him and hurting with ambulation for about 1.5 months.  Denies any pain at rest, it hurts only when he is ambulating or putting weight on his left foot.   Vitals in the ED showed temperature of 99, respiration rate of 15, heart rate 70, blood pressure 129/80, SpO2 97% on room air.   Serum sodium is 141, potassium 3.9, chloride 107, bicarb 23, BUN of 24, serum creatinine of 1.26, EGFR greater than 60, nonfasting blood glucose 128, WBC 10.6, hemoglobin 10.8, platelets of 334.   Lactic acid is 0.9.   ED treatment: Zosyn, vancomycin.    Assessment and Plan:  # Left Foot osteomyelitis and gangrene of second, third and fourth toes Blood culture NGTD Continue Unasyn and Zyvox Appreciate podiatry input, they recommend a left TMA which will be done Friday, 3/28   # Severe PAD s/p LLE Bypass done on 07/11/2023 Appreciate vascular surgery input, patient is status post left lower extremity angiogram 03/19 with plans for left lower extremity bypass surgery which was done on 03/24   # Cardiomyopathy # Chronic systolic CHF, compensated. Patient had complete echo on 05/08/2022: With estimated EF  < 20% Does not appear to be in acute exacerbation at this time Continue carvedilol with holding parameters as well as spironolactone   # Diabetes mellitus (HCC)  HbA1c 6.3, well-controlled Patient has stopped taking metformin per outpatient provider Continue NovoLog sliding scale Continue consistent carbohydrate diet   #  History of rectal cancer Continue outpatient follow-up with hematology/oncology   # HTN (hypertension) Continue carvedilol and spironolactone Use hydralazine as needed Monitor BP and titrate medications accordingly    # AKI Improved renal function Serum creatinine has improved from 1.40 >> 1.13 3/27 Cr 1.36 elevated, continue to monitor, held Aldactone  # Hypokalemia, potassium repleted.  Resolved  # Diarrhea Probably related to antibiotic therapy May use Imodium as needed Advised to eat yogurt for natural probiotics  # Hematuria developed last night on 3/22 3/26 Hb 8.3 fairly stable H&H fairly stable US renal: Possible blood clot in the bladder. Difficult to exclude malignancy.  5 mm nonobstructing right renal stone. Bilateral renal cysts. Hematuria possible due to bilateral renal cyst Patient may need Foley catheter if urinary retention Needs to follow-up with urology   # History of gout, c/o left knee gout flareup on 3/23 Left knee noticed swollen on 3/23 Resumed colchicine 0.6 mg p.o. daily home dose  Procedures: 07/11/2023: LLE bypass surgery: 1. Left femoral artery to posterior tibial artery bypass with in-situ saphenous vein graft 2. Left profunda femoris endarterectomy with bovine patch angioplasty separate and distinct   Subjective: No significant events overnight, patient was lying comfortably in the bed, stated that his pain is under control.  Left knee pain is almost resolved. Patient is aware of surgery tomorrow a.m.    Physical Exam: Vitals:   07/14/23 0504 07/14/23 0819 07/14/23 1142 07/14/23 1528  BP:  109/66 101/62 (!) 100/54  Pulse:  67 69   Resp:  16 16 16   Temp:  98 F (36.7 C) (!)  97.4 F (36.3 C) 97.9 F (36.6 C)  TempSrc:    Oral  SpO2:  100% 99% 99%  Weight: 74 kg     Height:       General: NAD, lying comfortably Appear in no distress, affect appropriate Eyes: PERRLA ENT: Oral Mucosa Clear, moist  Neck: no JVD,  Cardiovascular:  S1 and S2 Present, no Murmur,  Respiratory: good respiratory effort, Bilateral Air entry equal and Decreased, no Crackles, no wheezes Abdomen: Bowel Sound present, Soft and no tenderness,  Skin: no rashes Extremities: s/p LLE Bypass, Honeycomb dressing attached.  Left second third and fourth toe gangrenous Neurologic: without any new focal findings Gait not checked due to patient safety concerns   Data Reviewed:  Labs reviewed  Family Communication: Plan of care discussed with patient at the bedside.  All questions and concerns have been addressed.  He verbalizes understanding and agrees with the plan.  Disposition: Status is: Inpatient Remains inpatient appropriate because: Scheduled for left lower extremity bypass surgery on 03/24  Planned Discharge Destination:  TBD    Time spent: 40  minutes  Author: Gillis Santa, MD 07/14/2023 3:53 PM  For on call review [email or web address].

## 2023-07-14 NOTE — Progress Notes (Signed)
 Daily Progress Note   Subjective  - 3 Days Post-Op  Follow-up left foot gangrenous changes.  Has been transition from ICU to telemetry.  No complaints today.  Objective Vitals:   07/14/23 0321 07/14/23 0504 07/14/23 0819 07/14/23 1142  BP: 109/71  109/66 101/62  Pulse: 69  67 69  Resp: 18  16 16   Temp: 98.2 F (36.8 C)  98 F (36.7 C) (!) 97.4 F (36.3 C)  TempSrc: Oral     SpO2: 99%  100% 99%  Weight:  74 kg    Height:        Physical Exam: Necrotic left forefoot as previously described.  No acute changes.  Laboratory CBC    Component Value Date/Time   WBC 10.4 07/14/2023 0415   HGB 7.5 (L) 07/14/2023 0415   HGB 11.7 (L) 08/12/2014 0858   HCT 24.0 (L) 07/14/2023 0415   HCT 37.1 (L) 08/12/2014 0858   PLT 334 07/14/2023 0415   PLT 282 08/12/2014 0858    BMET    Component Value Date/Time   NA 141 07/14/2023 0415   NA 132 (L) 08/12/2014 0858   K 3.5 07/14/2023 0415   K 4.0 08/12/2014 0858   CL 107 07/14/2023 0415   CL 100 (L) 08/12/2014 0858   CO2 22 07/14/2023 0415   CO2 24 08/12/2014 0858   GLUCOSE 108 (H) 07/14/2023 0415   GLUCOSE 118 (H) 08/12/2014 0858   BUN 41 (H) 07/14/2023 0415   BUN 30 (H) 08/12/2014 0858   CREATININE 1.36 (H) 07/14/2023 0415   CREATININE 1.52 (H) 08/12/2014 0858   CALCIUM 8.6 (L) 07/14/2023 0415   CALCIUM 8.9 08/12/2014 0858   GFRNONAA 56 (L) 07/14/2023 0415   GFRNONAA 49 (L) 08/12/2014 0858   GFRAA >60 01/10/2020 0940   GFRAA 57 (L) 08/12/2014 4098    Assessment/Planning: Peripheral vascular disease with left forefoot gangrene  We discussed the transmetatarsal amputation that is planned for tomorrow.  I discussed the risk benefits alternatives and complications associated with surgery.  All questions were answered.  We discussed postoperatively nonweightbearing.  We discussed slow healing and need for possible surgical invention down the road.  He expressed understanding.  Orders will be placed.   [name] [name]  A  07/14/2023, 12:23 PM

## 2023-07-14 NOTE — Progress Notes (Signed)
 Progress Note    07/14/2023 3:12 PM 3 Days Post-Op  Subjective:   [name] [name] is a 68 yo male now POD #3 from Left femoral artery to posterior tibial artery bypass with in-situ saphenous vein graft and Left profunda femoris endarterectomy with bovine patch angioplasty.    On exam this afternoon patient is resting comfortably in bed. Recovering as expected. Endorses soreness to his left lower extremity.  Patient worked with physical therapy 2 days ago and was able to stand but not able to walk.  Patient's wife is at the bedside this morning endorses patient will be going for surgery tomorrow for transmetatarsal amputation of the left foot due to the gangrene.  No complaints overnight and vitals all remain stable.    Vitals:   07/14/23 0819 07/14/23 1142  BP: 109/66 101/62  Pulse: 67 69  Resp: 16 16  Temp: 98 F (36.7 C) (!) 97.4 F (36.3 C)  SpO2: 100% 99%   Physical Exam: Cardiac:  RRR, normal S1 and S2.  No murmurs appreciated. Lungs: Lungs clear on auscultation throughout.  No rales rhonchi or wheezing. Incisions: Right groin and left leg with dressing clean dry and intact.  No hematoma seroma or infection to note. Extremities: Right lower extremity is warm to touch with Doppler PT pulse only.  Left lower extremity with doppler DP and PT pulses and left lower extremity feels warm today. Abdomen: Positive bowel sounds throughout, soft, nontender nondistended. Neurologic: AAOX3, answers all questions and follows commands appropriately.  CBC    Component Value Date/Time   WBC 10.4 07/14/2023 0415   RBC 2.89 (L) 07/14/2023 0415   HGB 7.5 (L) 07/14/2023 0415   HGB 11.7 (L) 08/12/2014 0858   HCT 24.0 (L) 07/14/2023 0415   HCT 37.1 (L) 08/12/2014 0858   PLT 334 07/14/2023 0415   PLT 282 08/12/2014 0858   MCV 83.0 07/14/2023 0415   MCV 72 (L) 08/12/2014 0858   MCH 26.0 07/14/2023 0415   MCHC 31.3 07/14/2023 0415   RDW 15.4 07/14/2023 0415   RDW 20.6 (H) 08/12/2014 0858    LYMPHSABS 0.8 07/04/2023 1318   LYMPHSABS 0.3 (L) 08/12/2014 0858   MONOABS 0.8 07/04/2023 1318   MONOABS 0.7 08/12/2014 0858   EOSABS 0.2 07/04/2023 1318   EOSABS 0.2 08/12/2014 0858   BASOSABS 0.0 07/04/2023 1318   BASOSABS 0.0 08/12/2014 0858    BMET    Component Value Date/Time   NA 141 07/14/2023 0415   NA 132 (L) 08/12/2014 0858   K 3.5 07/14/2023 0415   K 4.0 08/12/2014 0858   CL 107 07/14/2023 0415   CL 100 (L) 08/12/2014 0858   CO2 22 07/14/2023 0415   CO2 24 08/12/2014 0858   GLUCOSE 108 (H) 07/14/2023 0415   GLUCOSE 118 (H) 08/12/2014 0858   BUN 41 (H) 07/14/2023 0415   BUN 30 (H) 08/12/2014 0858   CREATININE 1.36 (H) 07/14/2023 0415   CREATININE 1.52 (H) 08/12/2014 0858   CALCIUM 8.6 (L) 07/14/2023 0415   CALCIUM 8.9 08/12/2014 0858   GFRNONAA 56 (L) 07/14/2023 0415   GFRNONAA 49 (L) 08/12/2014 0858   GFRAA >60 01/10/2020 0940   GFRAA 57 (L) 08/12/2014 0858    INR    Component Value Date/Time   INR 1.3 (H) 07/11/2023 0530     Intake/Output Summary (Last 24 hours) at 07/14/2023 1512 Last data filed at 07/14/2023 0816 Gross per 24 hour  Intake --  Output 600 ml  Net -600 ml  Assessment/Plan:  68 y.o. male is s/p Left femoral artery to posterior tibial artery bypass with in-situ saphenous vein graft and Left profunda femoris endarterectomy with bovine patch angioplasty.  3 Days Post-Op   Patient had a traumatic and difficult foley catheter insertion done by urology with dilation. Urology recommendation to leave foley catheter for 7 days before trial to urinate. Monday 07/18/23  PLAN Patient to return to the operating room with podiatry for a left transmetatarsal amputation tomorrow on 07/15/2023. OOB to chair most of the day Continue to work with PT/OT  ASA 81 mg and Plavix 75 mg daily.  Leave foley catheter in place for 7 days.   DVT prophylaxis: Heparin 5000 units subcu every 8 hours   Marcie Bal [facility] 07/14/2023 3:12 PM

## 2023-07-14 NOTE — TOC Progression Note (Signed)
 Transition of Care (TOC) - Progression Note    Patient Details  Name: [name] LUCCHESI [name] MRN: [id number] Date of Birth: 1955/05/14  [facility] of [facility] CM/SW Contact  Truddie Hidden, RN Phone Number: 07/14/2023, 2:57 PM  Clinical Narrative:    TOC continuing to follow patient's progress throughout discharge planning.        Expected Discharge Plan and Services                                               Social Determinants of Health (SDOH) Interventions SDOH Screenings   Food Insecurity: No Food Insecurity (07/04/2023)  Housing: Low Risk  (07/04/2023)  Transportation Needs: No Transportation Needs (07/04/2023)  Utilities: Not At Risk (07/04/2023)  Social Connections: Moderately Integrated (07/04/2023)  Tobacco Use: Medium Risk (07/04/2023)    Readmission Risk Interventions     No data to display

## 2023-07-15 ENCOUNTER — Inpatient Hospital Stay

## 2023-07-15 ENCOUNTER — Encounter
Admission: EM | Disposition: A | Discharge status: Skilled Nursing Facility | Payer: Self-pay | Source: Home / Self Care | Attending: Student

## 2023-07-15 ENCOUNTER — Inpatient Hospital Stay: Admitting: Anesthesiology

## 2023-07-15 ENCOUNTER — Encounter: Payer: Self-pay | Admitting: Internal Medicine

## 2023-07-15 ENCOUNTER — Other Ambulatory Visit: Payer: Self-pay

## 2023-07-15 DIAGNOSIS — Z89432 Acquired absence of left foot: Secondary | ICD-10-CM | POA: Diagnosis not present

## 2023-07-15 DIAGNOSIS — M869 Osteomyelitis, unspecified: Secondary | ICD-10-CM | POA: Diagnosis not present

## 2023-07-15 HISTORY — PX: TRANSMETATARSAL AMPUTATION: SHX6197

## 2023-07-15 LAB — CBC
HCT: 24 % — ABNORMAL LOW (ref 39.0–52.0)
Hemoglobin: 7.5 g/dL — ABNORMAL LOW (ref 13.0–17.0)
MCH: 26.2 pg (ref 26.0–34.0)
MCHC: 31.3 g/dL (ref 30.0–36.0)
MCV: 83.9 fL (ref 80.0–100.0)
Platelets: 355 10*3/uL (ref 150–400)
RBC: 2.86 MIL/uL — ABNORMAL LOW (ref 4.22–5.81)
RDW: 15.5 % (ref 11.5–15.5)
WBC: 11.1 10*3/uL — ABNORMAL HIGH (ref 4.0–10.5)
nRBC: 0 % (ref 0.0–0.2)

## 2023-07-15 LAB — CULTURE, BLOOD (ROUTINE X 2)
Culture: NO GROWTH
Special Requests: ADEQUATE
Special Requests: ADEQUATE

## 2023-07-15 LAB — BASIC METABOLIC PANEL WITH GFR
Anion gap: 12 (ref 5–15)
BUN: 34 mg/dL — ABNORMAL HIGH (ref 8–23)
CO2: 23 mmol/L (ref 22–32)
Calcium: 8.6 mg/dL — ABNORMAL LOW (ref 8.9–10.3)
Chloride: 108 mmol/L (ref 98–111)
Creatinine, Ser: 1.22 mg/dL (ref 0.61–1.24)
GFR, Estimated: >60 mL/min (ref >60)
Glucose, Bld: 76 mg/dL (ref 70–99)
Potassium: 3.3 mmol/L — ABNORMAL LOW (ref 3.5–5.1)
Sodium: 143 mmol/L (ref 135–145)

## 2023-07-15 LAB — GLUCOSE, CAPILLARY
Glucose-Capillary: 86 mg/dL (ref 70–99)
Glucose-Capillary: 66 mg/dL — ABNORMAL LOW (ref 70–99)
Glucose-Capillary: 131 mg/dL — ABNORMAL HIGH (ref 70–99)
Glucose-Capillary: 112 mg/dL — ABNORMAL HIGH (ref 70–99)
Glucose-Capillary: 113 mg/dL — ABNORMAL HIGH (ref 70–99)

## 2023-07-15 LAB — PREPARE RBC (CROSSMATCH)

## 2023-07-15 LAB — HEMOGLOBIN AND HEMATOCRIT, BLOOD
HCT: 22 % — ABNORMAL LOW (ref 39.0–52.0)
Hemoglobin: 6.8 g/dL — ABNORMAL LOW (ref 13.0–17.0)

## 2023-07-15 LAB — PHOSPHORUS: Phosphorus: 2.1 mg/dL — ABNORMAL LOW (ref 2.5–4.6)

## 2023-07-15 LAB — MAGNESIUM: Magnesium: 2.2 mg/dL (ref 1.7–2.4)

## 2023-07-15 SURGERY — AMPUTATION, FOOT, TRANSMETATARSAL
Anesthesia: Monitor Anesthesia Care | Site: Toe | Laterality: Left

## 2023-07-15 MED ORDER — MIDAZOLAM HCL 2 MG/2ML IJ SOLN
INTRAMUSCULAR | Status: AC
  Filled 2023-07-15: qty 2

## 2023-07-15 MED ORDER — LIDOCAINE HCL (PF) 2 % IJ SOLN
INTRAMUSCULAR | Status: AC
  Filled 2023-07-15: qty 5

## 2023-07-15 MED ORDER — PROPOFOL 1000 MG/100ML IV EMUL
INTRAVENOUS | Status: AC
  Filled 2023-07-15: qty 100

## 2023-07-15 MED ORDER — SODIUM CHLORIDE 0.9 % IV SOLN
INTRAVENOUS | Status: DC | PRN
Start: 2023-07-15 — End: 2023-07-15

## 2023-07-15 MED ORDER — OXYCODONE HCL 5 MG/5ML PO SOLN: 5.0000 mg | Freq: Once | ORAL | Status: DC | PRN

## 2023-07-15 MED ORDER — PHENYLEPHRINE 80 MCG/ML (10ML) SYRINGE FOR IV PUSH (FOR BLOOD PRESSURE SUPPORT)
PREFILLED_SYRINGE | INTRAVENOUS | Status: DC | PRN
  Administered 2023-07-15 (×2): 40 ug via INTRAVENOUS

## 2023-07-15 MED ORDER — POTASSIUM PHOSPHATES 15 MMOLE/5ML IV SOLN
30.0000 mmol | Freq: Once | INTRAVENOUS | Status: AC
  Administered 2023-07-15: 30 mmol via INTRAVENOUS
  Filled 2023-07-15: qty 10

## 2023-07-15 MED ORDER — FENTANYL CITRATE (PF) 100 MCG/2ML IJ SOLN
INTRAMUSCULAR | Status: AC
  Filled 2023-07-15: qty 2

## 2023-07-15 MED ORDER — PROPOFOL 10 MG/ML IV BOLUS
INTRAVENOUS | Status: AC
  Filled 2023-07-15: qty 20

## 2023-07-15 MED ORDER — ETOMIDATE 2 MG/ML IV SOLN
INTRAVENOUS | Status: DC | PRN
  Administered 2023-07-15 (×2): 10 mg via INTRAVENOUS
  Administered 2023-07-15: 20 mg via INTRAVENOUS
  Administered 2023-07-15 (×3): 10 mg via INTRAVENOUS

## 2023-07-15 MED ORDER — OXYCODONE HCL 5 MG PO TABS: 5.0000 mg | ORAL_TABLET | Freq: Once | ORAL | Status: DC | PRN

## 2023-07-15 MED ORDER — DROPERIDOL 2.5 MG/ML IJ SOLN: 0.6250 mg | Freq: Once | INTRAMUSCULAR | Status: DC | PRN

## 2023-07-15 MED ORDER — BUPIVACAINE HCL (PF) 0.5 % IJ SOLN
INTRAMUSCULAR | Status: AC
  Filled 2023-07-15: qty 20

## 2023-07-15 MED ORDER — CEFAZOLIN SODIUM-DEXTROSE 2-4 GM/100ML-% IV SOLN
INTRAVENOUS | Status: AC
  Filled 2023-07-15: qty 100

## 2023-07-15 MED ORDER — ACETAMINOPHEN 10 MG/ML IV SOLN: 1000.0000 mg | Freq: Once | INTRAVENOUS | Status: DC | PRN

## 2023-07-15 MED ORDER — FENTANYL CITRATE (PF) 100 MCG/2ML IJ SOLN: 25.0000 ug | INTRAMUSCULAR | Status: DC | PRN

## 2023-07-15 MED ORDER — MIDAZOLAM HCL 2 MG/2ML IJ SOLN
INTRAMUSCULAR | Status: AC
Start: 2023-07-15 — End: ?
  Filled 2023-07-15: qty 2

## 2023-07-15 MED ORDER — DEXTROSE 50 % IV SOLN
INTRAVENOUS | Status: AC
  Filled 2023-07-15: qty 50

## 2023-07-15 MED ORDER — SODIUM CHLORIDE 0.9% IV SOLUTION: Freq: Once | INTRAVENOUS | Status: AC

## 2023-07-15 MED ORDER — BUPIVACAINE LIPOSOME 1.3 % IJ SUSP
INTRAMUSCULAR | Status: DC | PRN
  Administered 2023-07-15: 20 mL via PERINEURAL

## 2023-07-15 MED ORDER — ETOMIDATE 2 MG/ML IV SOLN
INTRAVENOUS | Status: AC
  Filled 2023-07-15: qty 10

## 2023-07-15 MED ORDER — PHENYLEPHRINE 80 MCG/ML (10ML) SYRINGE FOR IV PUSH (FOR BLOOD PRESSURE SUPPORT)
PREFILLED_SYRINGE | INTRAVENOUS | Status: AC
  Filled 2023-07-15: qty 10

## 2023-07-15 MED ORDER — BUPIVACAINE HCL (PF) 0.5 % IJ SOLN
INTRAMUSCULAR | Status: DC | PRN
  Administered 2023-07-15: 20 mL via PERINEURAL

## 2023-07-15 MED ORDER — BUPIVACAINE LIPOSOME 1.3 % IJ SUSP
INTRAMUSCULAR | Status: AC
  Filled 2023-07-15: qty 20

## 2023-07-15 MED ORDER — THROMBIN 5000 UNITS EX KIT
PACK | CUTANEOUS | Status: AC
  Filled 2023-07-15: qty 1

## 2023-07-15 MED ORDER — BUPIVACAINE HCL (PF) 0.5 % IJ SOLN
INTRAMUSCULAR | Status: DC | PRN
  Administered 2023-07-15: 10 mL

## 2023-07-15 MED ORDER — DEXTROSE 50 % IV SOLN
50.0000 mL | Freq: Once | INTRAVENOUS | Status: AC
  Administered 2023-07-15: 50 mL via INTRAVENOUS

## 2023-07-15 MED ORDER — PROPOFOL 10 MG/ML IV BOLUS
INTRAVENOUS | Status: DC | PRN
  Administered 2023-07-15 (×5): 10 mg via INTRAVENOUS
  Administered 2023-07-15: 20 mg via INTRAVENOUS

## 2023-07-15 MED ORDER — MIDAZOLAM HCL 2 MG/2ML IJ SOLN
1.0000 mg | INTRAMUSCULAR | Status: AC | PRN
  Administered 2023-07-15 (×2): 1 mg via INTRAVENOUS

## 2023-07-15 SURGICAL SUPPLY — 45 items
BAG COUNTER SPONGE SURGICOUNT (BAG) IMPLANT
BLADE MED AGGRESSIVE (BLADE) IMPLANT
BLADE OSC/SAGITTAL 5.5X25 (BLADE) ×1 IMPLANT
BNDG COHESIVE 4X5 TAN STRL LF (GAUZE/BANDAGES/DRESSINGS) ×1 IMPLANT
BNDG ELASTIC 4X5.8 VLCR NS LF (GAUZE/BANDAGES/DRESSINGS) ×1 IMPLANT
BNDG ESMARCH 4X12 STRL LF (GAUZE/BANDAGES/DRESSINGS) ×1 IMPLANT
BNDG GAUZE DERMACEA FLUFF 4 (GAUZE/BANDAGES/DRESSINGS) ×1 IMPLANT
BNDG STRETCH 4X75 STRL LF (GAUZE/BANDAGES/DRESSINGS) ×1 IMPLANT
CNTNR URN SCR LID CUP LEK RST (MISCELLANEOUS) ×1 IMPLANT
CUFF TOURN SGL QUICK 12 (TOURNIQUET CUFF) IMPLANT
CUFF TOURN SGL QUICK 18X4 (TOURNIQUET CUFF) IMPLANT
DRAIN PENROSE 12X.25 LTX STRL (MISCELLANEOUS) IMPLANT
DRSG MEPILEX HEEL 8.7X9.1 (GAUZE/BANDAGES/DRESSINGS) IMPLANT
DURAPREP 26ML APPLICATOR (WOUND CARE) ×1 IMPLANT
ELECT REM PT RETURN 9FT ADLT (ELECTROSURGICAL) ×1 IMPLANT
ELECTRODE REM PT RTRN 9FT ADLT (ELECTROSURGICAL) ×1 IMPLANT
GAUZE SPONGE 4X4 12PLY STRL (GAUZE/BANDAGES/DRESSINGS) ×2 IMPLANT
GAUZE XEROFORM 1X8 LF (GAUZE/BANDAGES/DRESSINGS) ×1 IMPLANT
GLOVE BIO SURGEON STRL SZ7.5 (GLOVE) ×1 IMPLANT
GLOVE INDICATOR 8.0 STRL GRN (GLOVE) ×1 IMPLANT
GOWN STRL REUS W/ TWL XL LVL3 (GOWN DISPOSABLE) ×1 IMPLANT
GOWN STRL REUS W/TWL XL LVL4 (GOWN DISPOSABLE) ×1 IMPLANT
HANDLE YANKAUER SUCT BULB TIP (MISCELLANEOUS) IMPLANT
KIT THROMBINATOR ANGEL CPRP (KITS) IMPLANT
KIT TURNOVER KIT A (KITS) ×1 IMPLANT
LABEL OR SOLS (LABEL) IMPLANT
MANIFOLD NEPTUNE II (INSTRUMENTS) ×1 IMPLANT
NDL HYPO 25X1 1.5 SAFETY (NEEDLE) ×1 IMPLANT
NDL SAFETY ECLIPSE 18X1.5 (NEEDLE) ×1 IMPLANT
NEEDLE HYPO 25X1 1.5 SAFETY (NEEDLE) ×1 IMPLANT
NS IRRIG 500ML POUR BTL (IV SOLUTION) ×1 IMPLANT
PACK EXTREMITY ARMC (MISCELLANEOUS) ×1 IMPLANT
PAD ABD DERMACEA PRESS 5X9 (GAUZE/BANDAGES/DRESSINGS) ×2 IMPLANT
SOL PREP PVP 2OZ (MISCELLANEOUS) ×1 IMPLANT
SOLUTION PREP PVP 2OZ (MISCELLANEOUS) ×1 IMPLANT
SPONGE T-LAP 18X18 ~~LOC~~+RFID (SPONGE) ×1 IMPLANT
STOCKINETTE M/LG 89821 (MISCELLANEOUS) ×1 IMPLANT
SURGIFLO W/THROMBIN 8M KIT (HEMOSTASIS) IMPLANT
SUT ETHILON 3-0 FS-10 30 BLK (SUTURE) ×3 IMPLANT
SUT VIC AB 2-0 SH 27XBRD (SUTURE) IMPLANT
SUTURE EHLN 3-0 FS-10 30 BLK (SUTURE) ×1 IMPLANT
SWAB CULTURE AMIES ANAERIB BLU (MISCELLANEOUS) IMPLANT
SYR 10ML LL (SYRINGE) ×2 IMPLANT
TRAP FLUID SMOKE EVACUATOR (MISCELLANEOUS) ×1 IMPLANT
WATER STERILE IRR 500ML POUR (IV SOLUTION) ×1 IMPLANT

## 2023-07-15 NOTE — Progress Notes (Signed)
  Progress Note    07/15/2023 9:27 AM 4 Days Post-Op  Subjective:    [name] [name] is a 68 yo male now POD #4 from   [name] [name] is a 68 yo male now POD #3 from Left femoral artery to posterior tibial artery bypass with in-situ saphenous vein graft and Left profunda femoris endarterectomy with bovine patch angioplasty.   I was unable to assess patient today as he went to the operating room for a left transmetatarsal amputation of his left foot.  Vitals:   07/15/23 0343 07/15/23 0828  BP: 111/68 118/67  Pulse: 62 74  Resp: 19   Temp: 98.5 F (36.9 C) 98.4 F (36.9 C)  SpO2: 99% 98%   Physical Exam: Unable to examine as the patient was in the operating room  CBC    Component Value Date/Time   WBC 11.1 (H) 07/15/2023 0521   RBC 2.86 (L) 07/15/2023 0521   HGB 7.5 (L) 07/15/2023 0521   HGB 11.7 (L) 08/12/2014 0858   HCT 24.0 (L) 07/15/2023 0521   HCT 37.1 (L) 08/12/2014 0858   PLT 355 07/15/2023 0521   PLT 282 08/12/2014 0858   MCV 83.9 07/15/2023 0521   MCV 72 (L) 08/12/2014 0858   MCH 26.2 07/15/2023 0521   MCHC 31.3 07/15/2023 0521   RDW 15.5 07/15/2023 0521   RDW 20.6 (H) 08/12/2014 0858   LYMPHSABS 0.8 07/04/2023 1318   LYMPHSABS 0.3 (L) 08/12/2014 0858   MONOABS 0.8 07/04/2023 1318   MONOABS 0.7 08/12/2014 0858   EOSABS 0.2 07/04/2023 1318   EOSABS 0.2 08/12/2014 0858   BASOSABS 0.0 07/04/2023 1318   BASOSABS 0.0 08/12/2014 0858    BMET    Component Value Date/Time   NA 143 07/15/2023 0521   NA 132 (L) 08/12/2014 0858   K 3.3 (L) 07/15/2023 0521   K 4.0 08/12/2014 0858   CL 108 07/15/2023 0521   CL 100 (L) 08/12/2014 0858   CO2 23 07/15/2023 0521   CO2 24 08/12/2014 0858   GLUCOSE 76 07/15/2023 0521   GLUCOSE 118 (H) 08/12/2014 0858   BUN 34 (H) 07/15/2023 0521   BUN 30 (H) 08/12/2014 0858   CREATININE 1.22 07/15/2023 0521   CREATININE 1.52 (H) 08/12/2014 0858   CALCIUM 8.6 (L) 07/15/2023 0521   CALCIUM 8.9 08/12/2014 0858   GFRNONAA >60  07/15/2023 0521   GFRNONAA 49 (L) 08/12/2014 0858   GFRAA >60 01/10/2020 0940   GFRAA 57 (L) 08/12/2014 0858    INR    Component Value Date/Time   INR 1.3 (H) 07/11/2023 0530     Intake/Output Summary (Last 24 hours) at 07/15/2023 0927 Last data filed at 07/15/2023 0700 Gross per 24 hour  Intake 1260.17 ml  Output 1125 ml  Net 135.17 ml     Assessment/Plan:  68 y.o. male is s/p   [name] [name] is a 68 yo male now POD #3 from Left femoral artery to posterior tibial artery bypass with in-situ saphenous vein graft and Left profunda femoris endarterectomy with bovine patch angioplasty.  4 Days Post-Op   PLAN Patient to go to the operating room today with podiatry for left transmetatarsal amputation.  DVT prophylaxis:  Heparin 5000 units subcu every 8 hours    [name] [name] [facility] 07/15/2023 9:27 AM

## 2023-07-15 NOTE — Anesthesia Preprocedure Evaluation (Addendum)
 Anesthesia Evaluation  Patient identified by MRN, date of birth, ID band Patient awake    Reviewed: Allergy & Precautions, H&P , NPO status , Patient's Chart, lab work & pertinent test results  Airway Mallampati: II  TM Distance: >3 FB Neck ROM: full    Dental  (+) Missing   Pulmonary former smoker   Pulmonary exam normal        Cardiovascular hypertension, + CAD (cath done (PCI/ stent) 2005 & cath done 2016), + Past MI (2005), + Cardiac Stents (2005), + Peripheral Vascular Disease and +CHF (HFrEF EF  < 20%)  Normal cardiovascular exam  ECHO 07/05/2023:  1. Left ventricular ejection fraction, by estimation, is 20%. The left  ventricle has severely decreased function. The left ventricle demonstrates  regional wall motion abnormalities (see scoring diagram/findings for  description). The left ventricular  internal cavity size was mildly to moderately dilated. Left ventricular  diastolic parameters are consistent with Grade I diastolic dysfunction  (impaired relaxation).   2. Right ventricular systolic function is normal. The right ventricular  size is normal.   3. Left atrial size was mildly dilated.   4. The mitral valve is normal in structure. Mild mitral valve  regurgitation.   5. The aortic valve is normal in structure. Aortic valve regurgitation is  not visualized.     Neuro/Psych negative neurological ROS  negative psych ROS   GI/Hepatic Neg liver ROS,GERD  ,,  Endo/Other  diabetes, Type 2    Renal/GU Renal disease  negative genitourinary   Musculoskeletal   Abdominal   Peds  Hematology  (+) Blood dyscrasia, anemia   Anesthesia Other Findings Left Foot osteomyelitis and gangrene of second, third and fourth toes -Plan TMA  -Severe PAD s/p LLE Bypass done on 07/11/2023. Pt had a difficult foley placed for which urology was consulted.  Plan: Patient will need Foley catheter for at least a week.  Will plan for  outpatient Foley removal.    Past Medical History: History of rectal cancer No date: Atherosclerosis No date: Cancer (HCC) No date: CHF (congestive heart failure) (HCC) No date: Chronic kidney disease     Comment:  kidney stones, UTI No date: Colon cancer (HCC) No date: Coronary artery disease No date: Diabetes mellitus without complication (HCC) No date: GERD (gastroesophageal reflux disease) No date: Gout No date: Hematuria No date: Hyperlipidemia No date: Hypertension No date: Iron deficiency anemia 2005: Myocardial infarction (HCC) No date: Peripheral vascular disease (HCC) No date: Pulmonary nodules No date: Rectal cancer (HCC) No date: Ureter, stricture No date: Wears dentures     Comment:  full upper  Past Surgical History: 10/23/2014: BOWEL RESECTION; N/A     Comment:  Procedure: LOW ANTERIOR BOWEL RESECTION;  Surgeon: Natale Lay, MD;  Location: [facility];  Service: General;                Laterality: N/A; No date: COLON SURGERY No date: COLONOSCOPY 06/06/14 No date: COLONOSCOPY WITH ESOPHAGOGASTRODUODENOSCOPY (EGD) 03/2004: CORONARY ANGIOPLASTY WITH STENT PLACEMENT 10/23/2014: CYSTOSCOPY WITH STENT PLACEMENT; Bilateral     Comment:  Procedure: CYSTOSCOPY WITH STENT PLACEMENT,URETHRAL               DILATION, LEFT RETROGRADE PYELOGRAM, URETEROSCOPY;                Surgeon: Vanna Scotland, MD;  Location: [facility];  Service: Urology;  Laterality: Bilateral; 10/23/2014: DIVERTING ILEOSTOMY; N/A     Comment:  Procedure: DIVERTING ILEOSTOMY;  Surgeon: Natale Lay, MD;              Location: [facility];  Service: General;  Laterality: N/A; 07/11/2023: FEMORAL-TIBIAL BYPASS GRAFT; Left     Comment:  Procedure: CREATION, BYPASS, ARTERIAL, FEMORAL TO               TIBIAL, USING GRAFT, INSITU;  Surgeon: Annice Needy, MD;               Location: [facility];  Service: Vascular;  Laterality:               Left; 09/08/2015: ILEOSTOMY CLOSURE; N/A     Comment:   Procedure: ILEOSTOMY TAKEDOWN;  Surgeon: Ricarda Frame, MD;  Location: [facility];  Service: General;                Laterality: N/A; 10/23/2014: LAPAROTOMY; N/A     Comment:  Procedure: EXPLORATORY LAPAROTOMY;  Surgeon: Natale Lay,               MD;  Location: [facility];  Service: General;  Laterality:               N/A; 07/06/2023: LOWER EXTREMITY ANGIOGRAPHY; Left     Comment:  Procedure: Lower Extremity Angiography;  Surgeon: Annice Needy, MD;  Location: [facility];  Service:               Cardiovascular;  Laterality: Left; 05/12/2015: PERIPHERAL VASCULAR CATHETERIZATION; N/A     Comment:  Procedure: Abdominal Aortogram w/Lower Extremity;                Surgeon: Annice Needy, MD;  Location: [facility]               [facility];  Service: Cardiovascular;  Laterality: N/A; 05/12/2015: PERIPHERAL VASCULAR CATHETERIZATION     Comment:  Procedure: Lower Extremity Intervention;  Surgeon: Annice Needy, MD;  Location: [facility];  Service:               Cardiovascular;; 06/30/2015: PERIPHERAL VASCULAR CATHETERIZATION; N/A     Comment:  Procedure: Abdominal Aortogram w/Lower Extremity;                Surgeon: Annice Needy, MD;  Location: [facility]               [facility];  Service: Cardiovascular;  Laterality: N/A; 06/30/2015: PERIPHERAL VASCULAR CATHETERIZATION     Comment:  Procedure: Lower Extremity Intervention;  Surgeon: Annice Needy, MD;  Location: [facility];  Service:               Cardiovascular;; 09/08/2015: PORT-A-CATH REMOVAL; Right     Comment:  Procedure: REMOVAL PORT-A-CATH;  Surgeon: Ricarda Frame, MD;  Location: [facility];  Service: General;                Laterality: Right; 07/01/2014: PORTACATH PLACEMENT     Comment:  Dr. Egbert Garibaldi  BMI    Body Mass Index: 24.81 kg/m      Reproductive/Obstetrics negative OB ROS                             Anesthesia  Physical Anesthesia Plan  ASA: 4  Anesthesia Plan: MAC and Regional   Post-op Pain Management: Regional block*   Induction: Intravenous  PONV Risk Score and Plan: Propofol infusion and TIVA  Airway Management Planned: Natural Airway  Additional Equipment:   Intra-op Plan:   Post-operative Plan:   Informed Consent: I have reviewed the patients History and Physical, chart, labs and discussed the procedure including the risks, benefits and alternatives for the proposed anesthesia with the patient or authorized representative who has indicated his/her understanding and acceptance.     Dental Advisory Given  Plan Discussed with: CRNA and Surgeon  Anesthesia Plan Comments: (GA back-up discussed)        Anesthesia Quick Evaluation

## 2023-07-15 NOTE — Progress Notes (Signed)
Patient off the unit at this time for procedure.

## 2023-07-15 NOTE — Transfer of Care (Signed)
 Immediate Anesthesia Transfer of Care Note  Patient: DEPAUL ARIZPE [name]  Procedure(s) Performed: AMPUTATION, FOOT, TRANSMETATARSAL (Left: Toe)  Patient Location: PACU  Anesthesia Type:MAC and Regional  Level of Consciousness: awake, drowsy, and patient cooperative  Airway & Oxygen Therapy: Patient Spontanous Breathing  Post-op Assessment: Report given to RN and Post -op Vital signs reviewed and stable  Post vital signs: Reviewed and stable  Last Vitals:  Vitals Value Taken Time  BP 109/68 07/15/23 1322  Temp 97   Pulse 68   Resp 16 07/15/23 1327  SpO2 100   Vitals shown include unfiled device data.  Last Pain:  Vitals:   07/15/23 1131  TempSrc: Temporal  PainSc: 5       Patients Stated Pain Goal: 0 (07/14/23 2100)  Complications: No notable events documented.

## 2023-07-15 NOTE — Anesthesia Procedure Notes (Signed)
 Anesthesia Regional Block: Adductor canal block   Pre-Anesthetic Checklist: , timeout performed,  Correct Patient, Correct Site, Correct Laterality,  Correct Procedure, Correct Position, site marked,  Risks and benefits discussed,  Surgical consent,  Pre-op evaluation,  At surgeon's request and post-op pain management  Laterality: Left  Prep: chloraprep       Needles:  Injection technique: Single-shot  Needle Type: Stimiplex     Needle Length: 9cm  Needle Gauge: 22     Additional Needles:   Procedures:,,,, ultrasound used (permanent image in chart),,    Narrative:  Start time: 07/15/2023 12:07 PM End time: 07/15/2023 12:10 PM Injection made incrementally with aspirations every 5 mL.  Performed by: Personally  Anesthesiologist: Foye Deer, MD  Additional Notes: Patient consented for risk and benefits of nerve block including but not limited to nerve damage, failed block, bleeding and infection.  Patient voiced understanding.  Functioning IV was confirmed and monitors were applied.  Timeout done prior to procedure and prior to any sedation being given to the patient.  Patient confirmed procedure site prior to any sedation given to the patient. Sterile prep,hand hygiene and sterile gloves were used.  Minimal sedation used for procedure.  No paresthesia endorsed by patient during the procedure.  Negative aspiration and negative test dose prior to incremental administration of local anesthetic. The patient tolerated the procedure well with no immediate complications.

## 2023-07-15 NOTE — Progress Notes (Signed)
 Progress Note   Patient: [name] [name] VHQ:[id number] DOB: 1955-08-17 DOA: 07/04/2023     11 DOS: the patient was seen and examined on 07/15/2023   Brief hospital course:  [name] [name], [name] is a 68 year old male with history of non-insulin-dependent diabetes mellitus, has since stopped taking metformin, hypertension, hyperlipidemia, who presents emergency department for chief concerns of black toes on the left foot. He reports that he has noticed his toes bothering him and hurting with ambulation for about 1.5 months.  Denies any pain at rest, it hurts only when he is ambulating or putting weight on his left foot.   Vitals in the ED showed temperature of 99, respiration rate of 15, heart rate 70, blood pressure 129/80, SpO2 97% on room air.   Serum sodium is 141, potassium 3.9, chloride 107, bicarb 23, BUN of 24, serum creatinine of 1.26, EGFR greater than 60, nonfasting blood glucose 128, WBC 10.6, hemoglobin 10.8, platelets of 334.   Lactic acid is 0.9.   ED treatment: Zosyn, vancomycin.    Assessment and Plan:  # Left Foot osteomyelitis and gangrene of second, third and fourth toes Blood culture NGTD Continue Unasyn and Zyvox Appreciate podiatry input, s/p left foot TMA done today 3/28, dressing CDI   # Severe PAD s/p LLE Bypass done on 07/11/2023 Appreciate vascular surgery input, patient is status post left lower extremity angiogram 03/19 with plans for left lower extremity bypass surgery which was done on 03/24   # Anemia Iron level 33 slightly low, Tsat 19% wnl, folate WNL,  B12 1974 elevated ( need to stop B12 supplement if he is taking any) Acute blood loss anemia, postop Hb dropped 6.8 3/28 Hb 6.8, postop, 1 unit PRBC transfused Monitor H&H and transfuse if hemoglobin less than 7   # Cardiomyopathy # Chronic systolic CHF, compensated. Patient had complete echo on 05/08/2022: With estimated EF  < 20% Does not appear to be in acute exacerbation at this  time Continue carvedilol with holding parameters as well as spironolactone   # Diabetes mellitus (HCC)  HbA1c 6.3, well-controlled Patient has stopped taking metformin per outpatient provider Continue NovoLog sliding scale Continue consistent carbohydrate diet   # History of rectal cancer Continue outpatient follow-up with hematology/oncology   # HTN (hypertension) Continue carvedilol and spironolactone Use hydralazine as needed Monitor BP and titrate medications accordingly    # AKI Improved renal function Serum creatinine has improved from 1.40 >> 1.13 3/27 Cr 1.36 elevated, continue to monitor, held Aldactone  # Hypokalemia, potassium repleted.  Resolved # Hypophosphatemia, Phos repleted. Monitor electrolytes and replete as needed  # Diarrhea Probably related to antibiotic therapy May use Imodium as needed Advised to eat yogurt for natural probiotics  # Hematuria developed last night on 3/22 3/26 Hb 8.3 fairly stable H&H fairly stable US renal: Possible blood clot in the bladder. Difficult to exclude malignancy.  5 mm nonobstructing right renal stone. Bilateral renal cysts. Hematuria possible due to bilateral renal cyst Patient may need Foley catheter if urinary retention Needs to follow-up with urology   # History of gout, c/o left knee gout flareup on 3/23 Left knee noticed swollen on 3/23 Resumed colchicine 0.6 mg p.o. daily home dose  Procedures: 07/11/2023: LLE bypass surgery: 1. Left femoral artery to posterior tibial artery bypass with in-situ saphenous vein graft 2. Left profunda femoris endarterectomy with bovine patch angioplasty separate and distinct 07/15/2023: s/p left foot TMA  Subjective: No significant events overnight, patient was seen postop,  tolerated procedure well.  Pain is under control.  Denied any complaints. Patient agreed with PRBC transfusion.  Physical Exam: Vitals:   07/15/23 1350 07/15/23 1355 07/15/23 1400 07/15/23 1405  BP:    110/63   Pulse: (!) 59  (!) 57 (!) 58  Resp: 16 15 18 20   Temp:    (!) 97.1 F (36.2 C)  TempSrc:      SpO2: 100% 100% 99% 100%  Weight:      Height:       General: NAD, lying comfortably Appear in no distress, affect appropriate Eyes: PERRLA ENT: Oral Mucosa Clear, moist  Neck: no JVD,  Cardiovascular: S1 and S2 Present, no Murmur,  Respiratory: good respiratory effort, Bilateral Air entry equal and Decreased, no Crackles, no wheezes Abdomen: Bowel Sound present, Soft and no tenderness,  Skin: no rashes Extremities: s/p LLE Bypass, Honeycomb dressing attached.  Left second third and fourth toe gangrenous Neurologic: without any new focal findings Gait not checked due to patient safety concerns   Data Reviewed:  Labs reviewed  Family Communication: Plan of care discussed with patient at the bedside.  All questions and concerns have been addressed.  He verbalizes understanding and agrees with the plan.  Disposition: Status is: Inpatient Remains inpatient appropriate because: s/p LLE bypass surgery on 03/24 and Left foot TMA on 3/28  Planned Discharge Destination:  TBD most likely rehab next week    Time spent: 55  minutes  Author: Gillis Santa, MD 07/15/2023 2:14 PM  For on call review [email or web address].

## 2023-07-15 NOTE — Op Note (Signed)
 Operative note   Surgeon:Janei Scheff [employer] forces logistics/support/administrative officer: None    Preop diagnosis: Gangrene left forefoot    Postop diagnosis: Same    Procedure: Transmetatarsal amputation left foot    EBL: Minimal    Anesthesia:regional and IV sedation    Hemostasis: None    Specimen: Gangrene left forefoot for pathology and deep wound culture    Complications: None    Operative indications:[name] T Withem [name] is an 68 y.o. that presents today for surgical intervention.  The risks/benefits/alternatives/complications have been discussed and consent has been given.    Procedure:  Patient was brought into the OR and placed on the operating table in thesupine position. After anesthesia was obtained theleft lower extremity was prepped and draped in usual sterile fashion.  Attention was directed to the distal left foot where the necrotic tissue was noted of the lesser toes to the level of the metatarsophalangeal joint region.  Full-thickness flaps were created both dorsal and plantar proximal to the necrotic tissue along the midshaft region of the metatarsal.  The midshaft area was exposed.  With a power saw osteotomies were created and the transmetatarsal amputation was performed.  The distal tissue was removed from the surgical field in toto.  Bleeders were Bovie cauterized as appropriate.  At this time the wound was flushed with copious amounts of irrigation.  Closure was performed with subcutaneous tissue reapproximated loosely with a 3-0 Vicryl and the skin reapproximated with a 3-0 nylon.  The deeper tissues were infiltrated with Surgiflo with topical thrombin.  A large bulky sterile dressing was applied.  The procedure good bleeding was noted throughout.  Good perfusion of the skin flaps were noted after final closure.    Patient tolerated the procedure and anesthesia well.  Was transported from the OR to the PACU with all vital signs stable and vascular status intact. To be discharged per routine  protocol.  Will follow up in approximately 1 week in the outpatient clinic.

## 2023-07-15 NOTE — Plan of Care (Signed)
  Problem: Coping: Goal: Ability to adjust to condition or change in health will improve Outcome: Progressing   Problem: Metabolic: Goal: Ability to maintain appropriate glucose levels will improve Outcome: Progressing   Problem: Tissue Perfusion: Goal: Adequacy of tissue perfusion will improve Outcome: Progressing   Problem: Education: Goal: Knowledge of General Education information will improve Description: Including pain rating scale, medication(s)/side effects and non-pharmacologic comfort measures Outcome: Progressing

## 2023-07-15 NOTE — Progress Notes (Signed)
 PT Cancellation Note  Patient Details Name: [name] SAPP [name] MRN: [id number] DOB: July 12, 1955   Cancelled Treatment:     Pt is off floor for tran-met amputation. PT will sign off. Please re-order after surgery if PT services are continued to be needed.    Rushie Chestnut 07/15/2023, 11:27 AM

## 2023-07-15 NOTE — Plan of Care (Signed)

## 2023-07-15 NOTE — Anesthesia Procedure Notes (Signed)
 Anesthesia Regional Block: Popliteal block   Pre-Anesthetic Checklist: , timeout performed,  Correct Patient, Correct Site, Correct Laterality,  Correct Procedure, Correct Position, site marked,  Risks and benefits discussed,  Surgical consent,  Pre-op evaluation,  At surgeon's request and post-op pain management  Laterality: Left  Prep: chloraprep       Needles:  Injection technique: Single-shot  Needle Type: Stimiplex     Needle Length: 9cm  Needle Gauge: 22     Additional Needles:   Procedures:,,,, ultrasound used (permanent image in chart),,    Narrative:  Start time: 07/15/2023 12:13 PM End time: 07/15/2023 12:15 PM Injection made incrementally with aspirations every 5 mL.  Performed by: Personally  Anesthesiologist: Foye Deer, MD  Additional Notes: Patient consented for risk and benefits of nerve block including but not limited to nerve damage, failed block, bleeding and infection.  Patient voiced understanding.  Functioning IV was confirmed and monitors were applied.  Timeout done prior to procedure and prior to any sedation being given to the patient.  Patient confirmed procedure site prior to any sedation given to the patient. Sterile prep,hand hygiene and sterile gloves were used.  Minimal sedation used for procedure.  No paresthesia endorsed by patient during the procedure.  Negative aspiration and negative test dose prior to incremental administration of local anesthetic. The patient tolerated the procedure well with no immediate complications.

## 2023-07-16 DIAGNOSIS — E119 Type 2 diabetes mellitus without complications: Secondary | ICD-10-CM

## 2023-07-16 DIAGNOSIS — Z85048 Personal history of other malignant neoplasm of rectum, rectosigmoid junction, and anus: Secondary | ICD-10-CM

## 2023-07-16 DIAGNOSIS — I5020 Unspecified systolic (congestive) heart failure: Secondary | ICD-10-CM

## 2023-07-16 DIAGNOSIS — D508 Other iron deficiency anemias: Secondary | ICD-10-CM

## 2023-07-16 DIAGNOSIS — M869 Osteomyelitis, unspecified: Secondary | ICD-10-CM | POA: Diagnosis not present

## 2023-07-16 DIAGNOSIS — E782 Mixed hyperlipidemia: Secondary | ICD-10-CM

## 2023-07-16 DIAGNOSIS — I10 Essential (primary) hypertension: Secondary | ICD-10-CM

## 2023-07-16 DIAGNOSIS — Z9889 Other specified postprocedural states: Secondary | ICD-10-CM

## 2023-07-16 DIAGNOSIS — I70219 Atherosclerosis of native arteries of extremities with intermittent claudication, unspecified extremity: Secondary | ICD-10-CM

## 2023-07-16 LAB — CBC
HCT: 25.5 % — ABNORMAL LOW (ref 39.0–52.0)
Hemoglobin: 8.4 g/dL — ABNORMAL LOW (ref 13.0–17.0)
MCH: 27.3 pg (ref 26.0–34.0)
MCHC: 32.9 g/dL (ref 30.0–36.0)
MCV: 82.8 fL (ref 80.0–100.0)
Platelets: 314 10*3/uL (ref 150–400)
RBC: 3.08 MIL/uL — ABNORMAL LOW (ref 4.22–5.81)
RDW: 15.6 % — ABNORMAL HIGH (ref 11.5–15.5)
WBC: 13.5 10*3/uL — ABNORMAL HIGH (ref 4.0–10.5)
nRBC: 0 % (ref 0.0–0.2)

## 2023-07-16 LAB — HEMOGLOBIN: Hemoglobin: 7.8 g/dL — ABNORMAL LOW (ref 13.0–17.0)

## 2023-07-16 LAB — TYPE AND SCREEN
ABO/RH(D): A POS
Antibody Screen: NEGATIVE
Unit division: 0

## 2023-07-16 LAB — BASIC METABOLIC PANEL WITH GFR
Anion gap: 11 (ref 5–15)
BUN: 24 mg/dL — ABNORMAL HIGH (ref 8–23)
CO2: 21 mmol/L — ABNORMAL LOW (ref 22–32)
Calcium: 8.2 mg/dL — ABNORMAL LOW (ref 8.9–10.3)
Chloride: 109 mmol/L (ref 98–111)
Creatinine, Ser: 1.06 mg/dL (ref 0.61–1.24)
GFR, Estimated: >60 mL/min (ref >60)
Glucose, Bld: 127 mg/dL — ABNORMAL HIGH (ref 70–99)
Potassium: 3 mmol/L — ABNORMAL LOW (ref 3.5–5.1)
Sodium: 141 mmol/L (ref 135–145)

## 2023-07-16 LAB — BPAM RBC
Blood Product Expiration Date: 202504282359
ISSUE DATE / TIME: 202503281810
Unit Type and Rh: 6200

## 2023-07-16 LAB — GLUCOSE, CAPILLARY
Glucose-Capillary: 128 mg/dL — ABNORMAL HIGH (ref 70–99)
Glucose-Capillary: 114 mg/dL — ABNORMAL HIGH (ref 70–99)
Glucose-Capillary: 120 mg/dL — ABNORMAL HIGH (ref 70–99)
Glucose-Capillary: 131 mg/dL — ABNORMAL HIGH (ref 70–99)

## 2023-07-16 LAB — PHOSPHORUS: Phosphorus: 2.5 mg/dL (ref 2.5–4.6)

## 2023-07-16 LAB — MAGNESIUM: Magnesium: 2.2 mg/dL (ref 1.7–2.4)

## 2023-07-16 MED ORDER — LACTATED RINGERS IV BOLUS
500.0000 mL | Freq: Once | INTRAVENOUS | Status: AC
  Administered 2023-07-17: 500 mL via INTRAVENOUS

## 2023-07-16 NOTE — Plan of Care (Signed)
  Problem: Education: Goal: Ability to describe self-care measures that may prevent or decrease complications (Diabetes Survival Skills Education) will improve Outcome: Progressing   Problem: Metabolic: Goal: Ability to maintain appropriate glucose levels will improve Outcome: Progressing   Problem: Nutritional: Goal: Maintenance of adequate nutrition will improve Outcome: Progressing   Problem: Tissue Perfusion: Goal: Adequacy of tissue perfusion will improve Outcome: Progressing

## 2023-07-16 NOTE — Progress Notes (Signed)
 Daily Progress Note   Subjective  - 1 Day Post-Op  Status post transmetatarsal amputation left foot.  Patient without complaints today.  States pain is minimal at this time.  Objective Vitals:   07/15/23 2220 07/15/23 2300 07/16/23 0349 07/16/23 0900  BP: (!) 98/51 (!) 104/53 118/72 115/67  Pulse: 66 66 70 70  Resp: 17 18 18 18   Temp: 99.3 F (37.4 C) 98.4 F (36.9 C) 98.4 F (36.9 C)   TempSrc: Oral Oral    SpO2: 99% 100% 100% 99%  Weight:      Height:        Physical Exam: Dressing removed.  Incision site is well coapted.  Minimal drainage noted.  Good perfusion of the skin flaps.       Laboratory CBC    Component Value Date/Time   WBC 13.5 (H) 07/16/2023 0520   HGB 8.4 (L) 07/16/2023 0520   HGB 11.7 (L) 08/12/2014 0858   HCT 25.5 (L) 07/16/2023 0520   HCT 37.1 (L) 08/12/2014 0858   PLT 314 07/16/2023 0520   PLT 282 08/12/2014 0858    BMET    Component Value Date/Time   NA 141 07/16/2023 0520   NA 132 (L) 08/12/2014 0858   K 3.0 (L) 07/16/2023 0520   K 4.0 08/12/2014 0858   CL 109 07/16/2023 0520   CL 100 (L) 08/12/2014 0858   CO2 21 (L) 07/16/2023 0520   CO2 24 08/12/2014 0858   GLUCOSE 127 (H) 07/16/2023 0520   GLUCOSE 118 (H) 08/12/2014 0858   BUN 24 (H) 07/16/2023 0520   BUN 30 (H) 08/12/2014 0858   CREATININE 1.06 07/16/2023 0520   CREATININE 1.52 (H) 08/12/2014 0858   CALCIUM 8.2 (L) 07/16/2023 0520   CALCIUM 8.9 08/12/2014 0858   GFRNONAA >60 07/16/2023 0520   GFRNONAA 49 (L) 08/12/2014 0858   GFRAA >60 01/10/2020 0940   GFRAA 57 (L) 08/12/2014 0858    Assessment/Planning: Status post transmetatarsal amputation left foot for gangrene  Intraoperatively the tissues were very healthy.  No signs of infection at the proximal amputation site.  Suspect this was a curative amputation.  While in house would recommend just continued antibiotics but upon discharge no further need for antibiotics. Dressing changed today.  Upon discharge would  recommend twice weekly dressings at a skilled nursing facility.  Can cleanse the wound gently with Betadine and apply a bulky padded bandage. Recommend nonweightbearing to the left foot.  Can use heel for transfer only. Can follow-up with podiatry in the outpatient clinic in 2 weeks. Podiatry to sign off for now but please reconsult if there are any concerns.  Gwyneth Revels A  07/16/2023, 10:10 AM

## 2023-07-16 NOTE — Plan of Care (Signed)

## 2023-07-16 NOTE — Progress Notes (Signed)
 Progress Note   Patient: [name] [name] UXL:[id number] DOB: [birth year] DOA: 07/04/2023     12 DOS: the patient was seen and examined on 07/16/2023   Brief hospital course: Ms. Evrett Hakim, [name] is a 68 year old male with history of non-insulin-dependent diabetes mellitus, has since stopped taking metformin, hypertension, hyperlipidemia, who presents emergency department for chief concerns of black toes on the left foot.  Vitals in the ED showed temperature of 99, respiration rate of 15, heart rate 70, blood pressure 129/80, SpO2 97% on room air.  Serum sodium is 141, potassium 3.9, chloride 107, bicarb 23, BUN of 24, serum creatinine of 1.26, EGFR greater than 60, nonfasting blood glucose 128, WBC 10.6, hemoglobin 10.8, platelets of 334.  Lactic acid is 0.9.  ED treatment: Zosyn, vancomycin.  Assessment and Plan: Left Foot osteomyelitis and gangrene of second, third and fourth toes Blood culture NGTD Continue Unasyn and Zyvox Appreciate podiatry input, s/p left foot TMA done today 3/28, continue dressing changed. NWB Left foot. Heel transfer only.   Severe PAD s/p LLE Bypass done on 07/11/2023 Appreciate vascular surgery input, patient is status post left lower extremity angiogram 03/19 with plans for left lower extremity bypass surgery which was done on 03/24. PT evaluation, out of bed.   Chronic Anemia Iron level 33 slightly low, Tsat 19% wnl, folate WNL,  B12 1974 elevated ( need to stop B12 supplement if he is taking any)  Acute blood loss anemia, postop Hb dropped 6.8 3/28 Hb 6.8, postop, 1 unit PRBC transfused 07/15/23. Repeat Hb 8.4 today. Monitor H&H and transfuse if hemoglobin less than 7   Cardiomyopathy Chronic systolic CHF, compensated. Patient had complete echo on 05/08/2022: With estimated EF  < 20% Does not appear to be in acute exacerbation at this time Continue carvedilol with holding parameters as well as spironolactone   Diabetes mellitus (HCC)   HbA1c 6.3, well-controlled Patient has stopped taking metformin per outpatient provider Continue NovoLog sliding scale Continue consistent carbohydrate diet   History of rectal cancer Continue outpatient follow-up with hematology/oncology   Hypertension Continue carvedilol and spironolactone Use hydralazine as needed Monitor BP and titrate medications accordingly   AKI Improved renal function Serum creatinine has improved from 1.40 >> 1.06 Plan to resume aldactone.   Hypokalemia, potassium repleted.  Resolved.  Hypophosphatemia, Phos repleted. Monitor electrolytes and replete as needed   Diarrhea Probably related to antibiotic therapy May use Imodium as needed Advised to eat yogurt for natural probiotics   Hematuria developed last night on 3/22 3/26 Hb 8.3 fairly stable H&H fairly stable US renal: Possible blood clot in the bladder. Difficult to exclude malignancy.  5 mm nonobstructing right renal stone. Bilateral renal cysts. Hematuria possible due to bilateral renal cyst Patient may need Foley catheter if urinary retention Needs to follow-up with urology   History of gout, c/o left knee gout flareup on 3/23 Left knee noticed swollen on 3/23 Resumed colchicine 0.6 mg p.o. daily home dose      Out of bed to chair. Incentive spirometry. Nursing supportive care. Fall, aspiration precautions. Diet:  Diet Orders (From admission, onward)     Start     Ordered   07/15/23 1532  Diet Carb Modified Fluid consistency: Thin; Room service appropriate? Yes  Diet effective now       Question Answer Comment  Diet-HS Snack? Nothing   Calorie Level Medium 1600-2000   Fluid consistency: Thin   Room service appropriate? Yes      07/15/23 1531  DVT prophylaxis: heparin injection 5,000 Units Start: 07/05/23 2200 Place TED hose Start: 07/04/23 1937  Level of care: Telemetry Cardiac   Code Status: Full Code  Subjective: Patient is seen and examined today  morning. He is lying comfortably. Did not get out of bed. Left foot dressing intact.  Physical Exam: Vitals:   07/15/23 2300 07/16/23 0349 07/16/23 0900 07/16/23 1100  BP: (!) 104/53 118/72 115/67 107/63  Pulse: 66 70 70 80  Resp: 18 18 18 17   Temp: 98.4 F (36.9 C) 98.4 F (36.9 C)  98.1 F (36.7 C)  TempSrc: Oral   Oral  SpO2: 100% 100% 99% 98%  Weight:      Height:        General - Elderly African American male, no apparent distress HEENT - PERRLA, EOMI, atraumatic head, non tender sinuses. Lung - Clear, basal rales, no rhonchi, wheezes. Heart - S1, S2 heard, no murmurs, rubs, trace pedal edema. Abdomen - Soft, non tender, bowel sounds good Neuro - Alert, awake and oriented x 3, non focal exam. Skin - Warm and dry.  Data Reviewed:      Latest Ref Rng & Units 07/16/2023    5:20 AM 07/16/2023   12:33 AM 07/15/2023    3:57 PM  CBC  WBC 4.0 - 10.5 K/uL 13.5     Hemoglobin 13.0 - 17.0 g/dL 8.4  7.8  6.8   Hematocrit 39.0 - 52.0 % 25.5   22.0   Platelets 150 - 400 K/uL 314         Latest Ref Rng & Units 07/16/2023    5:20 AM 07/15/2023    5:21 AM 07/14/2023    4:15 AM  BMP  Glucose 70 - 99 mg/dL 161  76  096   BUN 8 - 23 mg/dL 24  34  41   Creatinine 0.61 - 1.24 mg/dL 0.45  4.09  8.11   Sodium 135 - 145 mmol/L 141  143  141   Potassium 3.5 - 5.1 mmol/L 3.0  3.3  3.5   Chloride 98 - 111 mmol/L 109  108  107   CO2 22 - 32 mmol/L 21  23  22    Calcium 8.9 - 10.3 mg/dL 8.2  8.6  8.6    [place] OR NERVE BLOCK-IMAGE ONLY [facility]) Result Date: 07/15/2023 There is no interpretation for this exam.  This order is for images obtained during a surgical procedure.  Please See "Surgeries" Tab for more information regarding the procedure.    Family Communication: Discussed with patient, he understand and agree. All questions answered.  Disposition: Status is: Inpatient Remains inpatient appropriate because: PT/OT Eval, SNF placement  Planned Discharge Destination: Skilled nursing  facility     Time spent: 39 minutes  Author: Marcelino Duster, MD 07/16/2023 3:42 PM Secure chat 7am to 7pm For on call review [email or web address].

## 2023-07-16 NOTE — Progress Notes (Signed)
 Subjective  - POD # 4, status post left femoral to posterior tibial in situ bypass  No complaints of pain this morning   Physical Exam:  Incision healing nicely.  Foot is warm and well-perfused.  TMA site is healing nicely       Assessment/Plan:  POD #4  Needs more mobilization.  Will consult PT.  Family is interested in acute rehab. Continue aspirin statin and Plavix Continue antibiotics while inpatient  [name] [name] 07/16/2023 2:16 PM --  Vitals:   07/16/23 0900 07/16/23 1100  BP: 115/67 107/63  Pulse: 70 80  Resp: 18 17  Temp:  98.1 F (36.7 C)  SpO2: 99% 98%    Intake/Output Summary (Last 24 hours) at 07/16/2023 1416 Last data filed at 07/16/2023 0313 Gross per 24 hour  Intake 574 ml  Output 300 ml  Net 274 ml     Laboratory CBC    Component Value Date/Time   WBC 13.5 (H) 07/16/2023 0520   HGB 8.4 (L) 07/16/2023 0520   HGB 11.7 (L) 08/12/2014 0858   HCT 25.5 (L) 07/16/2023 0520   HCT 37.1 (L) 08/12/2014 0858   PLT 314 07/16/2023 0520   PLT 282 08/12/2014 0858    BMET    Component Value Date/Time   NA 141 07/16/2023 0520   NA 132 (L) 08/12/2014 0858   K 3.0 (L) 07/16/2023 0520   K 4.0 08/12/2014 0858   CL 109 07/16/2023 0520   CL 100 (L) 08/12/2014 0858   CO2 21 (L) 07/16/2023 0520   CO2 24 08/12/2014 0858   GLUCOSE 127 (H) 07/16/2023 0520   GLUCOSE 118 (H) 08/12/2014 0858   BUN 24 (H) 07/16/2023 0520   BUN 30 (H) 08/12/2014 0858   CREATININE 1.06 07/16/2023 0520   CREATININE 1.52 (H) 08/12/2014 0858   CALCIUM 8.2 (L) 07/16/2023 0520   CALCIUM 8.9 08/12/2014 0858   GFRNONAA >60 07/16/2023 0520   GFRNONAA 49 (L) 08/12/2014 0858   GFRAA >60 01/10/2020 0940   GFRAA 57 (L) 08/12/2014 0858    COAG Lab Results  Component Value Date   INR 1.3 (H) 07/11/2023   INR 1.00 08/28/2015   No results found for: "PTT"  Antibiotics Anti-infectives (From admission, onward)    Start     Dose/Rate Route Frequency Ordered Stop   07/15/23  0600  ceFAZolin (ANCEF) IVPB 2g/100 mL premix        2 g 200 mL/hr over 30 Minutes Intravenous On call to O.R. 07/14/23 1258 07/15/23 1241   07/11/23 1458  vancomycin (VANCOCIN) powder  Status:  Discontinued          As needed 07/11/23 1458 07/11/23 1646   07/11/23 1458  gentamicin (GARAMYCIN) injection  Status:  Discontinued          As needed 07/11/23 1459 07/11/23 1646   07/10/23 1045  linezolid (ZYVOX) tablet 600 mg        600 mg Oral Every 12 hours 07/10/23 1000     07/05/23 2200  Ampicillin-Sulbactam (UNASYN) 3 g in sodium chloride 0.9 % 100 mL IVPB        3 g 200 mL/hr over 30 Minutes Intravenous Every 6 hours 07/05/23 1355     07/05/23 2000  vancomycin (VANCOREADY) IVPB 1250 mg/250 mL  Status:  Discontinued        1,250 mg 166.7 mL/hr over 90 Minutes Intravenous Every 24 hours 07/04/23 2008 07/05/23 1355   07/05/23 2000  linezolid (ZYVOX) IVPB 600 mg  Status:  Discontinued        600 mg 300 mL/hr over 60 Minutes Intravenous Every 12 hours 07/05/23 1355 07/08/23 1627   07/05/23 1605  ceFAZolin (ANCEF) IVPB 2g/100 mL premix  Status:  Discontinued        2 g 200 mL/hr over 30 Minutes Intravenous 30 min pre-op 07/05/23 1605 07/06/23 0720   07/04/23 2015  vancomycin (VANCOREADY) IVPB 500 mg/100 mL        500 mg 100 mL/hr over 60 Minutes Intravenous  Once 07/04/23 2001 07/05/23 0011   07/04/23 2015  ceFEPIme (MAXIPIME) 2 g in sodium chloride 0.9 % 100 mL IVPB  Status:  Discontinued        2 g 200 mL/hr over 30 Minutes Intravenous Every 12 hours 07/04/23 2001 07/05/23 1355   07/04/23 1830  vancomycin (VANCOCIN) IVPB 1000 mg/200 mL premix        1,000 mg 200 mL/hr over 60 Minutes Intravenous  Once 07/04/23 1820 07/04/23 2045   07/04/23 1830  piperacillin-tazobactam (ZOSYN) IVPB 3.375 g        3.375 g 100 mL/hr over 30 Minutes Intravenous  Once 07/04/23 1820 07/04/23 1912        V. Charlena Cross, M.D., [facility] [place] Office:  [phone] Pager:  [phone]

## 2023-07-16 NOTE — Plan of Care (Signed)
 Problem: Education: Goal: Ability to describe self-care measures that may prevent or decrease complications (Diabetes Survival Skills Education) will improve 07/16/2023 0229 by Tracie Harrier, RN Outcome: Progressing 07/16/2023 0219 by Tracie Harrier, RN Outcome: Progressing   Problem: Coping: Goal: Ability to adjust to condition or change in health will improve 07/16/2023 0229 by Tracie Harrier, RN Outcome: Progressing 07/16/2023 0219 by Tracie Harrier, RN Outcome: Progressing   Problem: Fluid Volume: Goal: Ability to maintain a balanced intake and output will improve 07/16/2023 0229 by Tracie Harrier, RN Outcome: Progressing 07/16/2023 0219 by Tracie Harrier, RN Outcome: Progressing   Problem: Health Behavior/Discharge Planning: Goal: Ability to identify and utilize available resources and services will improve 07/16/2023 0229 by Tracie Harrier, RN Outcome: Progressing 07/16/2023 0219 by Tracie Harrier, RN Outcome: Progressing Goal: Ability to manage health-related needs will improve 07/16/2023 0229 by Tracie Harrier, RN Outcome: Progressing 07/16/2023 0219 by Tracie Harrier, RN Outcome: Progressing   Problem: Metabolic: Goal: Ability to maintain appropriate glucose levels will improve 07/16/2023 0229 by Tracie Harrier, RN Outcome: Progressing 07/16/2023 0219 by Tracie Harrier, RN Outcome: Progressing   Problem: Nutritional: Goal: Maintenance of adequate nutrition will improve 07/16/2023 0229 by Tracie Harrier, RN Outcome: Progressing 07/16/2023 0219 by Tracie Harrier, RN Outcome: Progressing Goal: Progress toward achieving an optimal weight will improve 07/16/2023 0229 by Tracie Harrier, RN Outcome: Progressing 07/16/2023 0219 by Tracie Harrier, RN Outcome: Progressing   Problem: Skin Integrity: Goal: Risk for impaired skin integrity will decrease 07/16/2023 0229 by Tracie Harrier, RN Outcome: Progressing 07/16/2023 0219 by Tracie Harrier, RN Outcome: Progressing    Problem: Tissue Perfusion: Goal: Adequacy of tissue perfusion will improve 07/16/2023 0229 by Tracie Harrier, RN Outcome: Progressing 07/16/2023 0219 by Tracie Harrier, RN Outcome: Progressing   Problem: Education: Goal: Knowledge of General Education information will improve Description: Including pain rating scale, medication(s)/side effects and non-pharmacologic comfort measures 07/16/2023 0229 by Tracie Harrier, RN Outcome: Progressing 07/16/2023 0219 by Tracie Harrier, RN Outcome: Progressing   Problem: Health Behavior/Discharge Planning: Goal: Ability to manage health-related needs will improve 07/16/2023 0229 by Tracie Harrier, RN Outcome: Progressing 07/16/2023 0219 by Tracie Harrier, RN Outcome: Progressing   Problem: Clinical Measurements: Goal: Ability to maintain clinical measurements within normal limits will improve 07/16/2023 0229 by Tracie Harrier, RN Outcome: Progressing 07/16/2023 0219 by Tracie Harrier, RN Outcome: Progressing Goal: Will remain free from infection 07/16/2023 0229 by Tracie Harrier, RN Outcome: Progressing 07/16/2023 0219 by Tracie Harrier, RN Outcome: Progressing Goal: Diagnostic test results will improve 07/16/2023 0229 by Tracie Harrier, RN Outcome: Progressing 07/16/2023 0219 by Tracie Harrier, RN Outcome: Progressing Goal: Respiratory complications will improve 07/16/2023 0229 by Tracie Harrier, RN Outcome: Progressing 07/16/2023 0219 by Tracie Harrier, RN Outcome: Progressing Goal: Cardiovascular complication will be avoided 07/16/2023 0229 by Tracie Harrier, RN Outcome: Progressing 07/16/2023 0219 by Tracie Harrier, RN Outcome: Progressing   Problem: Activity: Goal: Risk for activity intolerance will decrease 07/16/2023 0229 by Tracie Harrier, RN Outcome: Progressing 07/16/2023 0219 by Tracie Harrier, RN Outcome: Progressing   Problem: Nutrition: Goal: Adequate nutrition will be maintained 07/16/2023 0229 by Tracie Harrier,  RN Outcome: Progressing 07/16/2023 0219 by Tracie Harrier, RN Outcome: Progressing   Problem: Coping: Goal: Level of anxiety will decrease 07/16/2023 0229 by Tracie Harrier, RN Outcome: Progressing 07/16/2023 0219 by Tracie Harrier, RN Outcome: Progressing   Problem: Elimination: Goal: Will not experience complications related to bowel motility 07/16/2023 0229 by Tracie Harrier, RN Outcome: Progressing 07/16/2023 0219 by Tracie Harrier, RN Outcome: Progressing Goal: Will not experience complications related  to urinary retention 07/16/2023 0229 by Tracie Harrier, RN Outcome: Progressing 07/16/2023 0219 by Tracie Harrier, RN Outcome: Progressing   Problem: Pain Managment: Goal: General experience of comfort will improve and/or be controlled 07/16/2023 0229 by Tracie Harrier, RN Outcome: Progressing 07/16/2023 0219 by Tracie Harrier, RN Outcome: Progressing   Problem: Safety: Goal: Ability to remain free from injury will improve 07/16/2023 0229 by Tracie Harrier, RN Outcome: Progressing 07/16/2023 0219 by Tracie Harrier, RN Outcome: Progressing   Problem: Skin Integrity: Goal: Risk for impaired skin integrity will decrease 07/16/2023 0229 by Tracie Harrier, RN Outcome: Progressing 07/16/2023 0219 by Tracie Harrier, RN Outcome: Progressing

## 2023-07-17 DIAGNOSIS — Z85048 Personal history of other malignant neoplasm of rectum, rectosigmoid junction, and anus: Secondary | ICD-10-CM | POA: Diagnosis not present

## 2023-07-17 DIAGNOSIS — N179 Acute kidney failure, unspecified: Secondary | ICD-10-CM | POA: Insufficient documentation

## 2023-07-17 DIAGNOSIS — E119 Type 2 diabetes mellitus without complications: Secondary | ICD-10-CM | POA: Diagnosis not present

## 2023-07-17 DIAGNOSIS — E876 Hypokalemia: Secondary | ICD-10-CM

## 2023-07-17 DIAGNOSIS — M869 Osteomyelitis, unspecified: Secondary | ICD-10-CM | POA: Diagnosis not present

## 2023-07-17 DIAGNOSIS — E782 Mixed hyperlipidemia: Secondary | ICD-10-CM | POA: Diagnosis not present

## 2023-07-17 LAB — CBC
HCT: 28 % — ABNORMAL LOW (ref 39.0–52.0)
Hemoglobin: 9 g/dL — ABNORMAL LOW (ref 13.0–17.0)
MCH: 26.9 pg (ref 26.0–34.0)
MCHC: 32.1 g/dL (ref 30.0–36.0)
MCV: 83.8 fL (ref 80.0–100.0)
Platelets: 395 10*3/uL (ref 150–400)
RBC: 3.34 MIL/uL — ABNORMAL LOW (ref 4.22–5.81)
RDW: 16.5 % — ABNORMAL HIGH (ref 11.5–15.5)
WBC: 21.5 10*3/uL — ABNORMAL HIGH (ref 4.0–10.5)
nRBC: 0 % (ref 0.0–0.2)

## 2023-07-17 LAB — BASIC METABOLIC PANEL WITH GFR
Anion gap: 16 — ABNORMAL HIGH (ref 5–15)
BUN: 34 mg/dL — ABNORMAL HIGH (ref 8–23)
CO2: 17 mmol/L — ABNORMAL LOW (ref 22–32)
Calcium: 8.4 mg/dL — ABNORMAL LOW (ref 8.9–10.3)
Chloride: 108 mmol/L (ref 98–111)
Creatinine, Ser: 2.43 mg/dL — ABNORMAL HIGH (ref 0.61–1.24)
GFR, Estimated: 28 mL/min — ABNORMAL LOW (ref >60)
Glucose, Bld: 134 mg/dL — ABNORMAL HIGH (ref 70–99)
Potassium: 3.8 mmol/L (ref 3.5–5.1)
Sodium: 141 mmol/L (ref 135–145)

## 2023-07-17 LAB — GLUCOSE, CAPILLARY
Glucose-Capillary: 126 mg/dL — ABNORMAL HIGH (ref 70–99)
Glucose-Capillary: 127 mg/dL — ABNORMAL HIGH (ref 70–99)
Glucose-Capillary: 129 mg/dL — ABNORMAL HIGH (ref 70–99)
Glucose-Capillary: 120 mg/dL — ABNORMAL HIGH (ref 70–99)

## 2023-07-17 LAB — MAGNESIUM: Magnesium: 2.2 mg/dL (ref 1.7–2.4)

## 2023-07-17 LAB — PHOSPHORUS: Phosphorus: 4.5 mg/dL (ref 2.5–4.6)

## 2023-07-17 MED ORDER — COLCHICINE 0.3 MG HALF TABLET
0.3000 mg | ORAL_TABLET | Freq: Every day | ORAL | Status: DC
  Administered 2023-07-18 – 2023-07-20 (×3): 0.3 mg via ORAL
  Filled 2023-07-17 (×3): qty 1

## 2023-07-17 MED ORDER — SODIUM CHLORIDE 0.9 % IV SOLN
3.0000 g | Freq: Two times a day (BID) | INTRAVENOUS | Status: DC
  Administered 2023-07-17 – 2023-07-18 (×2): 3 g via INTRAVENOUS
  Filled 2023-07-17 (×2): qty 8

## 2023-07-17 MED ORDER — SODIUM CHLORIDE 0.9 % IV SOLN
INTRAVENOUS | Status: AC
Start: 2023-07-17 — End: 2023-07-18

## 2023-07-17 NOTE — Progress Notes (Signed)
 Subjective  - POD #5, status post left femoral to posterior tibial in situ bypass  Did not get out of bed yesterday No pain this morning   Physical Exam:  Groin dressing removed today.  All incisions are clean and dry.  There was some moisture buildup in the groin.  I placed a Kerlix over top of this.    Assessment/Plan:  POD #5  Needs more mobilization.  PT consulted yesterday.  Family is interested in acute rehab. Continue aspirin statin and Plavix Continue antibiotics while inpatient We will remove Foley today  [name] [name] 07/17/2023 10:53 AM --  Vitals:   07/17/23 0354 07/17/23 0808  BP: (!) 130/90 119/72  Pulse: 78 79  Resp: 16 18  Temp: 98 F (36.7 C) 98.4 F (36.9 C)  SpO2: 99% 97%    Intake/Output Summary (Last 24 hours) at 07/17/2023 1053 Last data filed at 07/17/2023 0500 Gross per 24 hour  Intake 1260 ml  Output 150 ml  Net 1110 ml     Laboratory CBC    Component Value Date/Time   WBC 21.5 (H) 07/17/2023 0440   HGB 9.0 (L) 07/17/2023 0440   HGB 11.7 (L) 08/12/2014 0858   HCT 28.0 (L) 07/17/2023 0440   HCT 37.1 (L) 08/12/2014 0858   PLT 395 07/17/2023 0440   PLT 282 08/12/2014 0858    BMET    Component Value Date/Time   NA 141 07/17/2023 0440   NA 132 (L) 08/12/2014 0858   K 3.8 07/17/2023 0440   K 4.0 08/12/2014 0858   CL 108 07/17/2023 0440   CL 100 (L) 08/12/2014 0858   CO2 17 (L) 07/17/2023 0440   CO2 24 08/12/2014 0858   GLUCOSE 134 (H) 07/17/2023 0440   GLUCOSE 118 (H) 08/12/2014 0858   BUN 34 (H) 07/17/2023 0440   BUN 30 (H) 08/12/2014 0858   CREATININE 2.43 (H) 07/17/2023 0440   CREATININE 1.52 (H) 08/12/2014 0858   CALCIUM 8.4 (L) 07/17/2023 0440   CALCIUM 8.9 08/12/2014 0858   GFRNONAA 28 (L) 07/17/2023 0440   GFRNONAA 49 (L) 08/12/2014 0858   GFRAA >60 01/10/2020 0940   GFRAA 57 (L) 08/12/2014 0858    COAG Lab Results  Component Value Date   INR 1.3 (H) 07/11/2023   INR 1.00 08/28/2015   No results found  for: "PTT"  Antibiotics Anti-infectives (From admission, onward)    Start     Dose/Rate Route Frequency Ordered Stop   07/17/23 1600  Ampicillin-Sulbactam (UNASYN) 3 g in sodium chloride 0.9 % 100 mL IVPB        3 g 200 mL/hr over 30 Minutes Intravenous Every 12 hours 07/17/23 0954     07/15/23 0600  ceFAZolin (ANCEF) IVPB 2g/100 mL premix        2 g 200 mL/hr over 30 Minutes Intravenous On call to O.R. 07/14/23 1258 07/15/23 1241   07/11/23 1458  vancomycin (VANCOCIN) powder  Status:  Discontinued          As needed 07/11/23 1458 07/11/23 1646   07/11/23 1458  gentamicin (GARAMYCIN) injection  Status:  Discontinued          As needed 07/11/23 1459 07/11/23 1646   07/10/23 1045  linezolid (ZYVOX) tablet 600 mg        600 mg Oral Every 12 hours 07/10/23 1000     07/05/23 2200  Ampicillin-Sulbactam (UNASYN) 3 g in sodium chloride 0.9 % 100 mL IVPB  Status:  Discontinued  3 g 200 mL/hr over 30 Minutes Intravenous Every 6 hours 07/05/23 1355 07/17/23 0954   07/05/23 2000  vancomycin (VANCOREADY) IVPB 1250 mg/250 mL  Status:  Discontinued        1,250 mg 166.7 mL/hr over 90 Minutes Intravenous Every 24 hours 07/04/23 2008 07/05/23 1355   07/05/23 2000  linezolid (ZYVOX) IVPB 600 mg  Status:  Discontinued        600 mg 300 mL/hr over 60 Minutes Intravenous Every 12 hours 07/05/23 1355 07/08/23 1627   07/05/23 1605  ceFAZolin (ANCEF) IVPB 2g/100 mL premix  Status:  Discontinued        2 g 200 mL/hr over 30 Minutes Intravenous 30 min pre-op 07/05/23 1605 07/06/23 0720   07/04/23 2015  vancomycin (VANCOREADY) IVPB 500 mg/100 mL        500 mg 100 mL/hr over 60 Minutes Intravenous  Once 07/04/23 2001 07/05/23 0011   07/04/23 2015  ceFEPIme (MAXIPIME) 2 g in sodium chloride 0.9 % 100 mL IVPB  Status:  Discontinued        2 g 200 mL/hr over 30 Minutes Intravenous Every 12 hours 07/04/23 2001 07/05/23 1355   07/04/23 1830  vancomycin (VANCOCIN) IVPB 1000 mg/200 mL premix        1,000  mg 200 mL/hr over 60 Minutes Intravenous  Once 07/04/23 1820 07/04/23 2045   07/04/23 1830  piperacillin-tazobactam (ZOSYN) IVPB 3.375 g        3.375 g 100 mL/hr over 30 Minutes Intravenous  Once 07/04/23 1820 07/04/23 1912        V. Charlena Cross, M.D., [facility] [place] Office: [phone] Pager:  [phone]

## 2023-07-17 NOTE — Progress Notes (Signed)
 Progress Note   Patient: [name] [name] UJW:[id number] DOB: [birth year] DOA: 07/04/2023     13 DOS: the patient was seen and examined on 07/17/2023   Brief hospital course: Ms. Yonathan Perrow, [name] is a 68 year old male with history of non-insulin-dependent diabetes mellitus, has since stopped taking metformin, hypertension, hyperlipidemia, who presents emergency department for chief concerns of black toes on the left foot.  Vitals in the ED showed temperature of 99, respiration rate of 15, heart rate 70, blood pressure 129/80, SpO2 97% on room air.  Serum sodium is 141, potassium 3.9, chloride 107, bicarb 23, BUN of 24, serum creatinine of 1.26, EGFR greater than 60, nonfasting blood glucose 128, WBC 10.6, hemoglobin 10.8, platelets of 334.  Lactic acid is 0.9.  ED treatment: Zosyn, vancomycin.  Assessment and Plan: Left Foot osteomyelitis and gangrene of second, third and fourth toes Blood culture NGTD Continue Unasyn and Zyvox. WBC jumped today to 21.5. Appreciate podiatry input, s/p left foot TMA done today 3/28, continue dressing changed. NWB Left foot. Heel transfer only.   Severe PAD s/p LLE Bypass done on 07/11/2023 Appreciate vascular surgery input, patient is status post left lower extremity angiogram 03/19 with plans for left lower extremity bypass surgery which was done on 03/24. PT evaluation, out of bed.   Acute blood loss anemia, postop Hb dropped 6.8 3/28 Hb 6.8, postop, 1 unit PRBC transfused 07/15/23. Repeat Hb 9.0 today. Monitor H&H and transfuse if hemoglobin less than 7. Iron level 33 low, Tsat 19% wnl, folate WNL,  Continue iron, vit C supplements.   Cardiomyopathy Chronic systolic CHF, compensated. Patient had complete echo on 05/08/2022: With estimated EF  < 20% Does not appear to be in acute exacerbation at this time. Watch for fluid overload as he is started on IV fluids. Continue carvedilol with holding parameters, hold spironolactone   Diabetes  mellitus (HCC)  HbA1c 6.3, well-controlled Continue NovoLog sliding scale Continue consistent carbohydrate diet   History of rectal cancer Continue outpatient follow-up with hematology/oncology   Hypertension Continue carvedilol and spironolactone Use hydralazine as needed Monitor BP and titrate medications accordingly   Acute Kidney injury Renal function worsened overnight with low urine output. Creatinine 2.43 from 1.06 yesterday. Foley able to flush properly. Continue foley care. Started gentle IV hydration. He is eating poor. RN noted increase in urine output this afternoon. Aldactone held. Monitor daily renal function.   Hypokalemia, potassium repleted.    Hypophosphatemia, Phos repleted. Monitor electrolytes and replete as needed   Diarrhea Probably related to antibiotic therapy May use Imodium as needed. Eating poor, states he is drinking well.   Hematuria developed night on 3/22 US renal: Possible blood clot in the bladder. Difficult to exclude malignancy.  5 mm nonobstructing right renal stone. Bilateral renal cysts. Hematuria possible due to bilateral renal cyst Difficult placement of Foley catheter per urology. Needs to follow-up with urology for foley removal as outpatient.   History of gout, c/o left knee gout flareup on 3/23 Left knee noticed swollen on 3/23 Renally adjusted colchicine to 0.3mg .    Out of bed to chair. Incentive spirometry. Nursing supportive care. Fall, aspiration precautions. Diet:  Diet Orders (From admission, onward)     Start     Ordered   07/15/23 1532  Diet Carb Modified Fluid consistency: Thin; Room service appropriate? Yes  Diet effective now       Question Answer Comment  Diet-HS Snack? Nothing   Calorie Level Medium 1600-2000   Fluid consistency: Thin  Room service appropriate? Yes      07/15/23 1531           DVT prophylaxis: heparin injection 5,000 Units Start: 07/05/23 2200 Place TED hose Start: 07/04/23  1937  Level of care: Telemetry Cardiac   Code Status: Full Code  Subjective: Patient is seen and examined today morning. He is weak, eating poor. Has loose stools.  Did not get out of bed. Left foot dressing intact. Wife at bedside.  Physical Exam: Vitals:   07/16/23 2040 07/17/23 0354 07/17/23 0808 07/17/23 1139  BP: 131/84 (!) 130/90 119/72 100/77  Pulse: 86 78 79 76  Resp: 16 16 18 16   Temp: 98.9 F (37.2 C) 98 F (36.7 C) 98.4 F (36.9 C) 98.3 F (36.8 C)  TempSrc: Oral Oral Oral Oral  SpO2: 98% 99% 97% 99%  Weight:      Height:        General - Elderly weak African American male, no apparent distress HEENT - PERRLA, EOMI, atraumatic head, non tender sinuses. Lung - Clear, basal rales, no rhonchi, wheezes. Heart - S1, S2 heard, no murmurs, rubs, trace pedal edema. Abdomen - Soft, non tender, bowel sounds good Neuro - Alert, awake and oriented x 3, non focal exam. Skin - Warm and dry. Left foot dressing intact.  Data Reviewed:      Latest Ref Rng & Units 07/17/2023    4:40 AM 07/16/2023    5:20 AM 07/16/2023   12:33 AM  CBC  WBC 4.0 - 10.5 K/uL 21.5  13.5    Hemoglobin 13.0 - 17.0 g/dL 9.0  8.4  7.8   Hematocrit 39.0 - 52.0 % 28.0  25.5    Platelets 150 - 400 K/uL 395  314        Latest Ref Rng & Units 07/17/2023    4:40 AM 07/16/2023    5:20 AM 07/15/2023    5:21 AM  BMP  Glucose 70 - 99 mg/dL 295  284  76   BUN 8 - 23 mg/dL 34  24  34   Creatinine 0.61 - 1.24 mg/dL 1.32  4.40  1.02   Sodium 135 - 145 mmol/L 141  141  143   Potassium 3.5 - 5.1 mmol/L 3.8  3.0  3.3   Chloride 98 - 111 mmol/L 108  109  108   CO2 22 - 32 mmol/L 17  21  23    Calcium 8.9 - 10.3 mg/dL 8.4  8.2  8.6    No results found.   Family Communication: Discussed with patient, wife. They understand and agree. All questions answered.  Disposition: Status is: Inpatient Remains inpatient appropriate because:renal dysfunction, high WBC, SNF placement  Planned Discharge Destination:  Skilled nursing facility     Time spent: 41 minutes  Author: Marcelino Duster, MD 07/17/2023 3:58 PM Secure chat 7am to 7pm For on call review [email or web address].

## 2023-07-17 NOTE — Plan of Care (Signed)

## 2023-07-17 NOTE — Evaluation (Signed)
 Physical Therapy Evaluation Patient Details Name: [name] POLAN [name] MRN: [id number] DOB: 08-25-1955 Today's Date: 07/17/2023  History of Present Illness  [name], [name] is a 68 year old male with history of non-insulin-dependent diabetes mellitus, has since stopped taking metformin, hypertension, hyperlipidemia, who presents emergency department for chief concerns of black toes on the left foot. Pt i now is s/pTransmetatarsal amputation left foot and S/P status post left femoral to posterior tibial in situ bypass. NWB to LLW but allowed to WB sligthly on L heel for balance.  Clinical Impression  Pt received in bed with wife by her side agreeable to PT interventions. Pt is pleasant and motivated,. PLOF Ind with household level and community level activity participation using RW ion Home and SPC outdoor. Ind with ADLs. Wife cooked. Pt assessment revealed pt has decreased sensation in L foot, weakness in L>R requiring Mod to max assist for bed mobility and transfers. Pt compliant to WB status established by MD of NWB  and slight WB thru heel for balance and transfers through out the session.  Pt participated n AROM to BLE which are provided to him in writting on [place] board so that he can perform every 2-3 hrs 7 to 10 reps each. Pt and wife demonstrated good understanding. PT will continue in acute and pt will benefit from rehab beyond Acute care confirmed by [facility] score. Pt and wife agreed with POC.        If plan is discharge home, recommend the following: Two people to help with walking and/or transfers;A lot of help with bathing/dressing/bathroom;Assistance with cooking/housework;Direct supervision/assist for medications management;Assist for transportation;Help with stairs or ramp for entrance   Can travel by private vehicle        Equipment Recommendations None recommended by PT (After SNF stay)  Recommendations for Other Services       Functional Status Assessment Patient has  had a recent decline in their functional status and demonstrates the ability to make significant improvements in function in a reasonable and predictable amount of time.     Precautions / Restrictions Precautions Precautions: Fall Restrictions Weight Bearing Restrictions Per Provider Order: Yes LLE Weight Bearing Per Provider Order: Non weight bearing Other Position/Activity Restrictions: WB thru heel for balance      Mobility  Bed Mobility Overal bed mobility: Needs Assistance Bed Mobility: Supine to Sit     Supine to sit: Mod assist     General bed mobility comments: to BLE    Transfers Overall transfer level: Needs assistance Equipment used: Rolling walker (2 wheels) Transfers: Sit to/from Stand, Bed to chair/wheelchair/BSC Sit to Stand: Max assist, +2 physical assistance   Step pivot transfers: Mod assist, Max assist, +2 physical assistance, From elevated surface       General transfer comment: Heel Touch WB with heavy reilance on FWW.    Ambulation/Gait               General Gait Details: unable. Deferred today.  Stairs            Wheelchair Mobility     Tilt Bed    Modified Rankin (Stroke Patients Only)       Balance Overall balance assessment: Needs assistance Sitting-balance support: No upper extremity supported, Feet supported Sitting balance-Leahy Scale: Good   Postural control: Posterior lean Standing balance support: Bilateral upper extremity supported Standing balance-Leahy Scale: Poor Standing balance comment: reliant on RW  Pertinent Vitals/Pain Pain Assessment Pain Assessment: No/denies pain    Home Living Family/patient expects to be discharged to:: Private residence Living Arrangements: Spouse/significant other Available Help at Discharge: Available 24 hours/day Type of Home: House Home Access: Stairs to enter Entrance Stairs-Rails: None Entrance Stairs-Number of Steps: 2-3    Home Layout: One level Home Equipment: Rollator (4 wheels);Cane - single point      Prior Function Prior Level of Function : Independent/Modified Independent             Mobility Comments: reports using rollator in the home and [facility] in community ADLs Comments: Independent     Extremity/Trunk Assessment   Upper Extremity Assessment Upper Extremity Assessment: Overall WFL for tasks assessed    Lower Extremity Assessment Lower Extremity Assessment: Generalized weakness;LLE deficits/detail LLE Deficits / Details: weak, dec sensation due surgery LLE: Unable to fully assess due to immobilization LLE Sensation: decreased light touch LLE Coordination: decreased gross motor       Communication   Communication Communication: No apparent difficulties    Cognition Arousal: Alert Behavior During Therapy: WFL for tasks assessed/performed   PT - Cognitive impairments: No apparent impairments                       PT - Cognition Comments: wife present to provide previous level Following commands: Intact       Cueing Cueing Techniques: Verbal cues, Tactile cues     General Comments General comments (skin integrity, edema, etc.): L foot wrapped and Stapples intact in LLE.    Exercises General Exercises - Lower Extremity Ankle Circles/Pumps: AROM, 10 reps, Seated Long Arc Quad: AROM, Both, 10 reps, Seated Heel Slides: AROM, Both, 10 reps, Seated Straight Leg Raises: AROM, Both, 10 reps, Seated Hip Flexion/Marching: AROM, 10 reps, Both, Seated Other Exercises Other Exercises: above exs provided in written on White board for HEP every 2-3 hours 10reps each.   Assessment/Plan    PT Assessment Patient needs continued PT services  PT Problem List Decreased strength;Decreased range of motion;Decreased activity tolerance;Decreased balance;Decreased mobility;Impaired sensation       PT Treatment Interventions Gait training;Stair training;Functional mobility  training;Therapeutic activities;Therapeutic exercise;Balance training;Patient/family education;Neuromuscular re-education    PT Goals (Current goals can be found in the Care Plan section)  Acute Rehab PT Goals Patient Stated Goal: " TO get better like before and return home." PT Goal Formulation: With patient Time For Goal Achievement: 07/31/23 Potential to Achieve Goals: Good    Frequency 7X/week     Co-evaluation               AM-PAC PT "6 Clicks" Mobility  Outcome Measure Help needed turning from your back to your side while in a flat bed without using bedrails?: A Lot Help needed moving from lying on your back to sitting on the side of a flat bed without using bedrails?: A Lot Help needed moving to and from a bed to a chair (including a wheelchair)?: A Lot Help needed standing up from a chair using your arms (e.g., wheelchair or bedside chair)?: A Lot Help needed to walk in hospital room?: Total Help needed climbing 3-5 steps with a railing? : Total 6 Click Score: 10    End of Session Equipment Utilized During Treatment: Gait belt Activity Tolerance: Patient tolerated treatment well;Patient limited by fatigue Patient left: in chair;with call bell/phone within reach;with family/visitor present Nurse Communication: Mobility status;Weight bearing status PT Visit Diagnosis: Muscle weakness (generalized) (M62.81);Difficulty in walking, not  elsewhere classified (R26.2)    Time: 1610-9604 PT Time Calculation (min) (ACUTE ONLY): 38 min   Charges:   PT Evaluation $PT Eval Moderate Complexity: 1 Mod PT Treatments $Therapeutic Exercise: 8-22 mins $Therapeutic Activity: 8-22 mins PT General Charges $$ ACUTE PT VISIT: 1 Visit         Janet Berlin PT DPT 5:02 PM,07/17/23

## 2023-07-17 NOTE — Plan of Care (Signed)
  Problem: Coping: Goal: Ability to adjust to condition or change in health will improve Outcome: Progressing   Problem: Nutrition: Goal: Adequate nutrition will be maintained Outcome: Progressing   Problem: Pain Managment: Goal: General experience of comfort will improve and/or be controlled Outcome: Progressing

## 2023-07-18 ENCOUNTER — Encounter: Payer: Self-pay | Admitting: Podiatry

## 2023-07-18 DIAGNOSIS — M869 Osteomyelitis, unspecified: Secondary | ICD-10-CM | POA: Diagnosis not present

## 2023-07-18 LAB — GLUCOSE, CAPILLARY
Glucose-Capillary: 94 mg/dL (ref 70–99)
Glucose-Capillary: 142 mg/dL — ABNORMAL HIGH (ref 70–99)
Glucose-Capillary: 89 mg/dL (ref 70–99)
Glucose-Capillary: 94 mg/dL (ref 70–99)

## 2023-07-18 LAB — URINALYSIS, ROUTINE W REFLEX MICROSCOPIC
Bacteria, UA: NONE SEEN
Bilirubin Urine: NEGATIVE
Glucose, UA: NEGATIVE mg/dL
Ketones, ur: NEGATIVE mg/dL
Nitrite: NEGATIVE
Protein, ur: 100 mg/dL — AB
RBC / HPF: >50 RBC/hpf (ref 0–5)
Specific Gravity, Urine: 1.015 (ref 1.005–1.030)
WBC, UA: >50 WBC/hpf (ref 0–5)
pH: 5 (ref 5.0–8.0)

## 2023-07-18 LAB — BASIC METABOLIC PANEL WITH GFR
Anion gap: 14 (ref 5–15)
BUN: 42 mg/dL — ABNORMAL HIGH (ref 8–23)
CO2: 20 mmol/L — ABNORMAL LOW (ref 22–32)
Calcium: 8.2 mg/dL — ABNORMAL LOW (ref 8.9–10.3)
Chloride: 109 mmol/L (ref 98–111)
Creatinine, Ser: 2.08 mg/dL — ABNORMAL HIGH (ref 0.61–1.24)
GFR, Estimated: 34 mL/min — ABNORMAL LOW (ref >60)
Glucose, Bld: 81 mg/dL (ref 70–99)
Potassium: 3.4 mmol/L — ABNORMAL LOW (ref 3.5–5.1)
Sodium: 143 mmol/L (ref 135–145)

## 2023-07-18 LAB — CBC
HCT: 24.7 % — ABNORMAL LOW (ref 39.0–52.0)
Hemoglobin: 8.2 g/dL — ABNORMAL LOW (ref 13.0–17.0)
MCH: 27.9 pg (ref 26.0–34.0)
MCHC: 33.2 g/dL (ref 30.0–36.0)
MCV: 84 fL (ref 80.0–100.0)
Platelets: 316 10*3/uL (ref 150–400)
RBC: 2.94 MIL/uL — ABNORMAL LOW (ref 4.22–5.81)
RDW: 17 % — ABNORMAL HIGH (ref 11.5–15.5)
WBC: 16.6 10*3/uL — ABNORMAL HIGH (ref 4.0–10.5)
nRBC: 0.1 % (ref 0.0–0.2)

## 2023-07-18 LAB — PHOSPHORUS: Phosphorus: 2.6 mg/dL (ref 2.5–4.6)

## 2023-07-18 LAB — MAGNESIUM: Magnesium: 2.2 mg/dL (ref 1.7–2.4)

## 2023-07-18 MED ORDER — SODIUM CHLORIDE 0.9 % IV SOLN
3.0000 g | Freq: Four times a day (QID) | INTRAVENOUS | Status: AC
  Administered 2023-07-18 – 2023-07-19 (×6): 3 g via INTRAVENOUS
  Filled 2023-07-18 (×6): qty 8

## 2023-07-18 NOTE — Progress Notes (Signed)
 Patient complained of pain on the lower abdomen and penis, also no output from 5 am this morning, foley was leaking too, bladder scan was done at 6:45 am, MD and the urology made aware of.  08:10:- Foley flushed as per the order, of urine drained, patient said he felt much better.  11:00 am:- Got order to send urine sample for urine analysis, found some thing like pieces of skin or muscle in it, saved it for urology to see it, urology will be here tomorrow.

## 2023-07-18 NOTE — Progress Notes (Signed)
 The patient has had no difficulties or complaints during the shift  pertaining to urination. The foley bag that was placed by Urology 3/30 is intact. At 5 am there was urine in the bag. The patient is now complaining of some pain at his bladder. Urine is going around his foley and onto the bed pads.  Message sent to on-call MD.

## 2023-07-18 NOTE — Anesthesia Postprocedure Evaluation (Signed)
 Anesthesia Post Note  Patient: [name] HARBUCK [name]  Procedure(s) Performed: AMPUTATION, FOOT, TRANSMETATARSAL (Left: Toe)  Patient location during evaluation: PACU Anesthesia Type: General Level of consciousness: awake and alert Pain management: pain level controlled Vital Signs Assessment: post-procedure vital signs reviewed and stable Respiratory status: spontaneous breathing, nonlabored ventilation and respiratory function stable Cardiovascular status: blood pressure returned to baseline and stable Postop Assessment: no apparent nausea or vomiting Anesthetic complications: no   No notable events documented.   Last Vitals:  Vitals:   07/18/23 0904 07/18/23 0905  BP: (!) 99/59 90/63  Pulse: 71 75  Resp: 18 18  Temp: 36.7 C 36.7 C  SpO2: 99% 99%    Last Pain:  Vitals:   07/18/23 0905  TempSrc: Axillary  PainSc:                  Foye Deer

## 2023-07-18 NOTE — Progress Notes (Signed)
 Physical Therapy Treatment Patient Details Name: [name] HAWKER [name] MRN: [id number] DOB: 02-Aug-1955 Today's Date: 07/18/2023   History of Present Illness [name], [name] is a 68 year old male with history of non-insulin-dependent diabetes mellitus, has since stopped taking metformin, hypertension, hyperlipidemia, who presents emergency department for chief concerns of black toes on the left foot. Pt i now is s/pTransmetatarsal amputation left foot and S/P status post left femoral to posterior tibial in situ bypass. NWB to LLW but allowed to WB sligthly on L heel for balance.    PT Comments  Pt received upright in bed agreeable to PT services. RN present giving pt meds. Assisted in sitting while RN preps medications. Supervision needed with VC's for weight shifting to ease transfer to sitting EOB. Re-educated pt on WB status for LLE prior to mobility. X2 STS efforts performed. First one  modA with bed at lowered surface. Able to stand but notable posterior weight shift with inability to get to neutral for transfer. Pt returned to sitting and educated on improved sitting posture to stand with neutral posture. Bed elevated and with modA stands with neutral posture and BUE's on RW. Excessive WB needed through UE's on RW and PRN heel weight bearing on LLE during step pivot transfer at CGA and PRN VC's for RW sequencing. Pt able to sit safely with VC's for hand placement. Reviewed seated therex in recliner as listed below. Pt  making progress but still reliant on heavy single person support and elevated surfaces to safely transfer. D/c recs remain appropriate. Pt with all needs in reach.    If plan is discharge home, recommend the following: Two people to help with walking and/or transfers;A lot of help with bathing/dressing/bathroom;Assistance with cooking/housework;Direct supervision/assist for medications management;Assist for transportation;Help with stairs or ramp for entrance   Can travel by  private vehicle     No  Equipment Recommendations  Other (comment) (TBD by next venue of care.)    Recommendations for Other Services       Precautions / Restrictions Precautions Precautions: Fall Recall of Precautions/Restrictions: Intact Restrictions Weight Bearing Restrictions Per Provider Order: Yes LLE Weight Bearing Per Provider Order: Non weight bearing Other Position/Activity Restrictions: WB thru heel for balance     Mobility  Bed Mobility Overal bed mobility: Needs Assistance Bed Mobility: Supine to Sit     Supine to sit: Supervision, HOB elevated, Used rails     General bed mobility comments: VC's for UE placement to assist in weightshift to EOB Patient Response: Cooperative  Transfers Overall transfer level: Needs assistance Equipment used: Rolling walker (2 wheels) Transfers: Sit to/from Stand, Bed to chair/wheelchair/BSC Sit to Stand: Mod assist   Step pivot transfers: Mod assist, From elevated surface       General transfer comment: posterior bias with first STS effort. Required seated rest and education on sitting posture in prep for neutral standing before second attempt. Heel Touch WB with heavy reilance on FWW.    Ambulation/Gait               General Gait Details: deferred for pt safety.   Stairs             Wheelchair Mobility     Tilt Bed Tilt Bed Patient Response: Cooperative  Modified Rankin (Stroke Patients Only)       Balance Overall balance assessment: Needs assistance Sitting-balance support: No upper extremity supported, Feet supported Sitting balance-Leahy Scale: Good   Postural control: Posterior lean Standing balance support: Bilateral upper  extremity supported Standing balance-Leahy Scale: Poor Standing balance comment: reliant on RW. Initial posterior bias.                            Communication Communication Communication: No apparent difficulties  Cognition Arousal: Alert Behavior  During Therapy: WFL for tasks assessed/performed   PT - Cognitive impairments: No apparent impairments                         Following commands: Intact      Cueing Cueing Techniques: Verbal cues, Tactile cues  Exercises General Exercises - Lower Extremity Short Arc Quad: AROM, Strengthening, Right, 10 reps, Seated Long Arc Quad: AROM, Both, 10 reps, Seated Straight Leg Raises: AROM, Strengthening, Right, 10 reps, Supine Hip Flexion/Marching: AROM, 10 reps, Both, Seated    General Comments General comments (skin integrity, edema, etc.): L foot remains wrapped. Staples in place in LLE.      Pertinent Vitals/Pain Pain Assessment Pain Assessment: No/denies pain    Home Living                          Prior Function            PT Goals (current goals can now be found in the care plan section) Acute Rehab PT Goals Patient Stated Goal: " To get better like before and return home." PT Goal Formulation: With patient Time For Goal Achievement: 07/31/23 Potential to Achieve Goals: Good Progress towards PT goals: Progressing toward goals    Frequency    7X/week      PT Plan      Co-evaluation              AM-PAC PT "6 Clicks" Mobility   Outcome Measure  Help needed turning from your back to your side while in a flat bed without using bedrails?: A Little Help needed moving from lying on your back to sitting on the side of a flat bed without using bedrails?: A Lot Help needed moving to and from a bed to a chair (including a wheelchair)?: A Lot Help needed standing up from a chair using your arms (e.g., wheelchair or bedside chair)?: A Lot Help needed to walk in hospital room?: Total Help needed climbing 3-5 steps with a railing? : Total 6 Click Score: 11    End of Session Equipment Utilized During Treatment: Gait belt Activity Tolerance: Patient tolerated treatment well Patient left: in chair;with call bell/phone within reach Nurse  Communication: Mobility status;Weight bearing status PT Visit Diagnosis: Muscle weakness (generalized) (M62.81);Difficulty in walking, not elsewhere classified (R26.2)     Time: 4098-1191 PT Time Calculation (min) (ACUTE ONLY): 29 min  Charges:    $Gait Training: 8-22 mins $Therapeutic Exercise: 8-22 mins PT General Charges $$ ACUTE PT VISIT: 1 Visit                     Delphia Grates. Fairly IV, PT, DPT Physical Therapist- [place]  [facility]  07/18/2023, 10:53 AM

## 2023-07-18 NOTE — Progress Notes (Signed)
 Progress Note   Patient: [name] [name] ZOX:[id number] DOB: 27-Nov-1955 DOA: 07/04/2023     14 DOS: the patient was seen and examined on 07/18/2023   Brief hospital course: [name], [name] is a 68 year old male with history of non-insulin-dependent diabetes mellitus, has since stopped taking metformin, hypertension, hyperlipidemia, who presents emergency department for chief concerns of black toes on the left foot.  Vitals in the ED showed temperature of 99, respiration rate of 15, heart rate 70, blood pressure 129/80, SpO2 97% on room air.  Serum sodium is 141, potassium 3.9, chloride 107, bicarb 23, BUN of 24, serum creatinine of 1.26, EGFR greater than 60, nonfasting blood glucose 128, WBC 10.6, hemoglobin 10.8, platelets of 334.  Lactic acid is 0.9.  ED treatment: Zosyn, vancomycin.  Assessment and Plan: Left Foot osteomyelitis and gangrene of second, third and fourth toes Blood culture NGTD Continue Unasyn and Zyvox. WBC jumped today to 21.5. Appreciate podiatry input, s/p left foot TMA done on  3/28, continue dressing changed. NWB Left foot. Heel transfer only.   Severe PAD s/p LLE Bypass done on 07/11/2023 Appreciate vascular surgery input, patient is status post left lower extremity angiogram 03/19 with plans for left lower extremity bypass surgery which was done on 03/24. PT evaluation, out of bed.   Acute blood loss anemia, postop Hb dropped 6.8 3/28 Hb 6.8, postop, 1 unit PRBC transfused 07/15/23. Repeat Hb 9.0 today. Monitor H&H and transfuse if hemoglobin less than 7. Iron level 33 low, Tsat 19% wnl, folate WNL,  Continue iron, vit C supplements.   Cardiomyopathy Chronic systolic CHF, compensated. Patient had complete echo on 05/08/2022: With estimated EF  < 20% Does not appear to be in acute exacerbation at this time. Watch for fluid overload as he is started on IV fluids. Continue carvedilol with holding parameters, hold spironolactone   Diabetes  mellitus (HCC)  HbA1c 6.3, well-controlled Continue NovoLog sliding scale Continue consistent carbohydrate diet   History of rectal cancer Continue outpatient follow-up with hematology/oncology   Hypertension Continue carvedilol and spironolactone Use hydralazine as needed Monitor BP and titrate medications accordingly   Acute Kidney injury Renal function worsened overnight with low urine output. Creatinine 2.43 from 1.06. S/p IVF hydration. He was eating poor. Aldactone held. Monitor daily renal function. 3/31 Foley catheter got clogged overnight with [place] flushed and 800 mL urine was collected. Seen by urology, Foley catheter was inserted by urology in the OR  # Urethral stricture S/p urethral dilation in the OR for a dense bulbar urethral stricture requiring urethral dilation over a wire on 07/11/2023 As per urology: -catheter needs to be in place for at least a week -plans for an outpatient Foley removal and hematuria follow up  -continue to flush prn for decrease in output, bladder pain and/or spasms     Hypokalemia, potassium repleted.    Hypophosphatemia, Phos repleted. Monitor electrolytes and replete as needed   Diarrhea.  Resolved Probably related to antibiotic therapy May use Imodium as needed. Eating poor, states he is drinking well.   Hematuria developed night on 3/22 US renal: Possible blood clot in the bladder. Difficult to exclude malignancy.  5 mm nonobstructing right renal stone. Bilateral renal cysts. Hematuria possible due to bilateral renal cyst Difficult placement of Foley catheter per urology. Needs to follow-up with urology for foley removal as outpatient. 3/31 UA shows hematuria   History of gout, c/o left knee gout flareup on 3/23 Left knee noticed swollen on 3/23 Renally adjusted  colchicine to 0.3mg .    Out of bed to chair. Incentive spirometry. Nursing supportive care. Fall, aspiration precautions. Diet:  Diet Orders (From  admission, onward)     Start     Ordered   07/15/23 1532  Diet Carb Modified Fluid consistency: Thin; Room service appropriate? Yes  Diet effective now       Question Answer Comment  Diet-HS Snack? Nothing   Calorie Level Medium 1600-2000   Fluid consistency: Thin   Room service appropriate? Yes      07/15/23 1531           DVT prophylaxis: heparin injection 5,000 Units Start: 07/05/23 2200 Place TED hose Start: 07/04/23 1937  Level of care: Telemetry Cardiac   Code Status: Full Code  Subjective: Overnight patient had pain in the lower abdomen due to urinary retention which resolved after flushing Foley catheter.  In the morning time patient was feeling better, pain is well-controlled on the left leg.    Physical Exam: Vitals:   07/18/23 0439 07/18/23 0904 07/18/23 0905 07/18/23 1159  BP: 105/72 (!) 99/59 90/63 (!) 91/55  Pulse: 77 71 75 75  Resp: 16 18 18 18   Temp: 99 F (37.2 C) 98.1 F (36.7 C) 98.1 F (36.7 C) 98 F (36.7 C)  TempSrc: Oral Axillary Axillary Oral  SpO2: 97% 99% 99% 100%  Weight:      Height:        General - Elderly weak African American male, no apparent distress HEENT - PERRLA, EOMI, atraumatic head, non tender sinuses. Lung - Clear, basal rales, no rhonchi, wheezes. Heart - S1, S2 heard, no murmurs, rubs, trace pedal edema. Abdomen - Soft, non tender, bowel sounds good Neuro - Alert, awake and oriented x 3, non focal exam. Skin - Warm and dry. Left foot dressing intact, s/p left TMA   Data Reviewed:      Latest Ref Rng & Units 07/18/2023    4:16 AM 07/17/2023    4:40 AM 07/16/2023    5:20 AM  CBC  WBC 4.0 - 10.5 K/uL 16.6  21.5  13.5   Hemoglobin 13.0 - 17.0 g/dL 8.2  9.0  8.4   Hematocrit 39.0 - 52.0 % 24.7  28.0  25.5   Platelets 150 - 400 K/uL 316  395  314       Latest Ref Rng & Units 07/18/2023    4:16 AM 07/17/2023    4:40 AM 07/16/2023    5:20 AM  BMP  Glucose 70 - 99 mg/dL 81  528  413   BUN 8 - 23 mg/dL 42  34  24    Creatinine 0.61 - 1.24 mg/dL 2.44  0.10  2.72   Sodium 135 - 145 mmol/L 143  141  141   Potassium 3.5 - 5.1 mmol/L 3.4  3.8  3.0   Chloride 98 - 111 mmol/L 109  108  109   CO2 22 - 32 mmol/L 20  17  21    Calcium 8.9 - 10.3 mg/dL 8.2  8.4  8.2    No results found.   Family Communication: Discussed with patient, wife. They understand and agree. All questions answered.  Disposition: Status is: Inpatient Remains inpatient appropriate because:renal dysfunction, high WBC, SNF placement  Planned Discharge Destination: Skilled nursing facility     Time spent: 55 minutes  Author: Gillis Santa, MD 07/18/2023 3:41 PM Secure chat 7am to 7pm For on call review [email or web address].

## 2023-07-18 NOTE — Progress Notes (Signed)
 Urology Consult Follow Up  Subjective: Patient's Foley stopped draining around 5 am and patient was having bladder spasms and bladder pain.  Nursing staff flushed the Foley and was able to retrieve 800 mL of urine.    Patient's pain is now relieved.    VSS afebrile  WBC count 16.6 down from 21.5.  Serum creatinine 2.08 down from 2.43   Anti-infectives: Anti-infectives (From admission, onward)    Start     Dose/Rate Route Frequency Ordered Stop   07/18/23 1200  Ampicillin-Sulbactam (UNASYN) 3 g in sodium chloride 0.9 % 100 mL IVPB        3 g 200 mL/hr over 30 Minutes Intravenous Every 6 hours 07/18/23 0703     07/17/23 1600  Ampicillin-Sulbactam (UNASYN) 3 g in sodium chloride 0.9 % 100 mL IVPB  Status:  Discontinued        3 g 200 mL/hr over 30 Minutes Intravenous Every 12 hours 07/17/23 0954 07/18/23 0703   07/15/23 0600  ceFAZolin (ANCEF) IVPB 2g/100 mL premix        2 g 200 mL/hr over 30 Minutes Intravenous On call to O.R. 07/14/23 1258 07/15/23 1241   07/11/23 1458  vancomycin (VANCOCIN) powder  Status:  Discontinued          As needed 07/11/23 1458 07/11/23 1646   07/11/23 1458  gentamicin (GARAMYCIN) injection  Status:  Discontinued          As needed 07/11/23 1459 07/11/23 1646   07/10/23 1045  linezolid (ZYVOX) tablet 600 mg        600 mg Oral Every 12 hours 07/10/23 1000     07/05/23 2200  Ampicillin-Sulbactam (UNASYN) 3 g in sodium chloride 0.9 % 100 mL IVPB  Status:  Discontinued        3 g 200 mL/hr over 30 Minutes Intravenous Every 6 hours 07/05/23 1355 07/17/23 0954   07/05/23 2000  vancomycin (VANCOREADY) IVPB 1250 mg/250 mL  Status:  Discontinued        1,250 mg 166.7 mL/hr over 90 Minutes Intravenous Every 24 hours 07/04/23 2008 07/05/23 1355   07/05/23 2000  linezolid (ZYVOX) IVPB 600 mg  Status:  Discontinued        600 mg 300 mL/hr over 60 Minutes Intravenous Every 12 hours 07/05/23 1355 07/08/23 1627   07/05/23 1605  ceFAZolin (ANCEF) IVPB 2g/100 mL premix   Status:  Discontinued        2 g 200 mL/hr over 30 Minutes Intravenous 30 min pre-op 07/05/23 1605 07/06/23 0720   07/04/23 2015  vancomycin (VANCOREADY) IVPB 500 mg/100 mL        500 mg 100 mL/hr over 60 Minutes Intravenous  Once 07/04/23 2001 07/05/23 0011   07/04/23 2015  ceFEPIme (MAXIPIME) 2 g in sodium chloride 0.9 % 100 mL IVPB  Status:  Discontinued        2 g 200 mL/hr over 30 Minutes Intravenous Every 12 hours 07/04/23 2001 07/05/23 1355   07/04/23 1830  vancomycin (VANCOCIN) IVPB 1000 mg/200 mL premix        1,000 mg 200 mL/hr over 60 Minutes Intravenous  Once 07/04/23 1820 07/04/23 2045   07/04/23 1830  piperacillin-tazobactam (ZOSYN) IVPB 3.375 g        3.375 g 100 mL/hr over 30 Minutes Intravenous  Once 07/04/23 1820 07/04/23 1912       Current Facility-Administered Medications  Medication Dose Route Frequency Provider Last Rate Last Admin   0.9 %  sodium chloride  infusion   Intravenous Continuous Marcelino Duster, MD 100 mL/hr at 07/17/23 1221 New Bag at 07/17/23 1221   Ampicillin-Sulbactam (UNASYN) 3 g in sodium chloride 0.9 % 100 mL IVPB  3 g Intravenous Q6H Gillis Santa, MD       ascorbic acid (VITAMIN C) tablet 1,000 mg  1,000 mg Oral Daily Annice Needy, MD   1,000 mg at 07/17/23 0454   aspirin EC tablet 81 mg  81 mg Oral Daily Annice Needy, MD   81 mg at 07/17/23 0950   carvedilol (COREG) tablet 3.125 mg  3.125 mg Oral BID WC Annice Needy, MD   3.125 mg at 07/17/23 1717   Chlorhexidine Gluconate Cloth 2 % PADS 6 each  6 each Topical Daily Gillis Santa, MD   6 each at 07/17/23 1539   clopidogrel (PLAVIX) tablet 75 mg  75 mg Oral Daily Pace, Brien R, NP   75 mg at 07/17/23 0981   colchicine tablet 0.3 mg  0.3 mg Oral Daily Marcelino Duster, MD       docusate sodium (COLACE) capsule 100 mg  100 mg Oral BID Gillis Santa, MD   100 mg at 07/17/23 2251   docusate sodium (COLACE) capsule 100 mg  100 mg Oral BID PRN Gillis Santa, MD       feeding supplement  (ENSURE ENLIVE / ENSURE PLUS) liquid 237 mL  237 mL Oral BID BM Annice Needy, MD   237 mL at 07/17/23 1316   heparin injection 5,000 Units  5,000 Units Subcutaneous Q8H Annice Needy, MD   5,000 Units at 07/18/23 0559   insulin aspart (novoLOG) injection 0-5 Units  0-5 Units Subcutaneous QHS Annice Needy, MD       insulin aspart (novoLOG) injection 0-9 Units  0-9 Units Subcutaneous TID WC Annice Needy, MD   1 Units at 07/17/23 1718   linezolid (ZYVOX) tablet 600 mg  600 mg Oral Q12H Annice Needy, MD   600 mg at 07/17/23 2251   loperamide (IMODIUM) capsule 2 mg  2 mg Oral TID PRN Annice Needy, MD   2 mg at 07/14/23 0813   morphine (PF) 2 MG/ML injection 2 mg  2 mg Intravenous Q3H PRN Annice Needy, MD   2 mg at 07/16/23 2110   Oral care mouth rinse  15 mL Mouth Rinse PRN Annice Needy, MD       oxyCODONE (Oxy IR/ROXICODONE) immediate release tablet 5 mg  5 mg Oral Q6H PRN Annice Needy, MD   5 mg at 07/11/23 0344   oxyCODONE-acetaminophen (PERCOCET/ROXICET) 5-325 MG per tablet 1-2 tablet  1-2 tablet Oral Q6H PRN Annice Needy, MD   2 tablet at 07/18/23 1914   polyethylene glycol (MIRALAX / GLYCOLAX) packet 17 g  17 g Oral Daily Gillis Santa, MD   17 g at 07/13/23 1309   polyethylene glycol (MIRALAX / GLYCOLAX) packet 17 g  17 g Oral Daily PRN Gillis Santa, MD       pravastatin (PRAVACHOL) tablet 20 mg  20 mg Oral Daily Annice Needy, MD   20 mg at 07/17/23 7829   Facility-Administered Medications Ordered in Other Encounters  Medication Dose Route Frequency Provider Last Rate Last Admin   heparin lock flush 100 unit/mL  500 Units Intravenous Once Corcoran, Melissa C, MD       sodium chloride 0.9 % injection 10 mL  10 mL Intravenous PRN Marin Roberts, MD  10 mL at 08/26/14 1300   sodium chloride flush (NS) 0.9 % injection 10 mL  10 mL Intravenous PRN Rosey Bath, MD         Objective: Vital signs in last 24 hours: Temp:  [97.9 F (36.6 C)-99 F (37.2 C)] 99 F (37.2 C) (03/31  0439) Pulse Rate:  [75-78] 77 (03/31 0439) Resp:  [16-18] 16 (03/31 0439) BP: (99-121)/(57-77) 105/72 (03/31 0439) SpO2:  [97 %-100 %] 97 % (03/31 0439)  Intake/Output from previous day: 03/30 0701 - 03/31 0700 In: 1865 [I.V.:1665; IV Piggyback:200] Out: 1950 [Urine:1950] Intake/Output this shift: No intake/output data recorded.   Physical Exam Vitals and nursing note reviewed.  Constitutional:      Appearance: Normal appearance.  HENT:     Head: Normocephalic.     Nose: Nose normal.     Mouth/Throat:     Mouth: Mucous membranes are moist.  Eyes:     Extraocular Movements: Extraocular movements intact.     Conjunctiva/sclera: Conjunctivae normal.     Pupils: Pupils are equal, round, and reactive to light.  Pulmonary:     Effort: Pulmonary effort is normal.  Abdominal:     General: Abdomen is flat.     Palpations: Abdomen is soft.  Genitourinary:    Comments: Foley in place.  Draining amber cloudy urine.  A small clot was noted in the night bag.  Penile foreskin is slightly edematous.   Musculoskeletal:     Cervical back: Normal range of motion.  Skin:    General: Skin is warm.  Neurological:     General: No focal deficit present.     Mental Status: He is alert.  Psychiatric:        Mood and Affect: Mood normal.     Lab Results:  Recent Labs    07/17/23 0440 07/18/23 0416  WBC 21.5* 16.6*  HGB 9.0* 8.2*  HCT 28.0* 24.7*  PLT 395 316   BMET Recent Labs    07/17/23 0440 07/18/23 0416  NA 141 143  K 3.8 3.4*  CL 108 109  CO2 17* 20*  GLUCOSE 134* 81  BUN 34* 42*  CREATININE 2.43* 2.08*  CALCIUM 8.4* 8.2*   PT/INR No results for input(s): "LABPROT", "INR" in the last 72 hours. ABG No results for input(s): "PHART", "HCO3" in the last 72 hours.  Invalid input(s): "PCO2", "PO2"  Studies/Results: No results found.   Assessment: 68 year old male who underwent urethral dilation in the OR for a dense bulbar urethral stricture requiring urethral  dilation over a wire on 07/11/2023  Catheter became clogged this am with a small clot and likely some sediment  Easily unclogged after irrigation  Plan: -catheter needs to be in place for at least a week -plans for an outpatient Foley removal and hematuria follow up  -continue to flush prn for decrease in output, bladder pain and/or spasms       LOS: 14 days    [facility] 07/18/2023

## 2023-07-18 NOTE — Progress Notes (Signed)
 Progress Note    07/18/2023 12:26 PM 3 Days Post-Op  Subjective:  [name] [name] is a 68 yo male now POD #6 Left femoral artery to posterior tibial artery bypass with in-situ saphenous vein graft and Left profunda femoris endarterectomy with bovine patch angioplasty. . Patient is resting comfortably sitting in the bedside chair eating breakfast. Patient endorses only being able to stand and not able to walk. No other complaints overnight. Recovering as expected. Vitals all remain stable.    Vitals:   07/18/23 0905 07/18/23 1159  BP: 90/63 (!) 91/55  Pulse: 75 75  Resp: 18 18  Temp: 98.1 F (36.7 C) 98 F (36.7 C)  SpO2: 99% 100%   Physical Exam: Cardiac:  RRR, normal S1 and S2.  No murmurs appreciated. Lungs: Lungs clear on auscultation throughout.  No rales rhonchi or wheezing. Incisions: Right groin and left leg with dressing clean dry and intact.  No hematoma seroma or infection to note. Extremities: Right lower extremity is warm to touch with Doppler PT pulse only.  Left lower extremity with doppler DP and PT pulses and left lower extremity feels warm today. Abdomen: Positive bowel sounds throughout, soft, nontender nondistended. Neurologic: AAOX3, answers all questions and follows commands appropriately.    CBC    Component Value Date/Time   WBC 16.6 (H) 07/18/2023 0416   RBC 2.94 (L) 07/18/2023 0416   HGB 8.2 (L) 07/18/2023 0416   HGB 11.7 (L) 08/12/2014 0858   HCT 24.7 (L) 07/18/2023 0416   HCT 37.1 (L) 08/12/2014 0858   PLT 316 07/18/2023 0416   PLT 282 08/12/2014 0858   MCV 84.0 07/18/2023 0416   MCV 72 (L) 08/12/2014 0858   MCH 27.9 07/18/2023 0416   MCHC 33.2 07/18/2023 0416   RDW 17.0 (H) 07/18/2023 0416   RDW 20.6 (H) 08/12/2014 0858   LYMPHSABS 0.8 07/04/2023 1318   LYMPHSABS 0.3 (L) 08/12/2014 0858   MONOABS 0.8 07/04/2023 1318   MONOABS 0.7 08/12/2014 0858   EOSABS 0.2 07/04/2023 1318   EOSABS 0.2 08/12/2014 0858   BASOSABS 0.0 07/04/2023 1318    BASOSABS 0.0 08/12/2014 0858    BMET    Component Value Date/Time   NA 143 07/18/2023 0416   NA 132 (L) 08/12/2014 0858   K 3.4 (L) 07/18/2023 0416   K 4.0 08/12/2014 0858   CL 109 07/18/2023 0416   CL 100 (L) 08/12/2014 0858   CO2 20 (L) 07/18/2023 0416   CO2 24 08/12/2014 0858   GLUCOSE 81 07/18/2023 0416   GLUCOSE 118 (H) 08/12/2014 0858   BUN 42 (H) 07/18/2023 0416   BUN 30 (H) 08/12/2014 0858   CREATININE 2.08 (H) 07/18/2023 0416   CREATININE 1.52 (H) 08/12/2014 0858   CALCIUM 8.2 (L) 07/18/2023 0416   CALCIUM 8.9 08/12/2014 0858   GFRNONAA 34 (L) 07/18/2023 0416   GFRNONAA 49 (L) 08/12/2014 0858   GFRAA >60 01/10/2020 0940   GFRAA 57 (L) 08/12/2014 0858    INR    Component Value Date/Time   INR 1.3 (H) 07/11/2023 0530     Intake/Output Summary (Last 24 hours) at 07/18/2023 1226 Last data filed at 07/18/2023 0500 Gross per 24 hour  Intake 1865 ml  Output 1950 ml  Net -85 ml     Assessment/Plan:  68 y.o. male is s/p Left femoral artery to posterior tibial artery bypass with in-situ saphenous vein graft and Left profunda femoris endarterectomy with bovine patch angioplasty.  3 Days Post-Op  PLAN Needs more  mobilization. Continue to work with physical therapy   Family at the bedside wants acute rehab. Continue aspirin statin and Plavix Continue antibiotics while inpatient  DVT prophylaxis:  Heparin 5000 units SQ Every 8 Hours   Grisell Bissette R Rogina Schiano [facility] 07/18/2023 12:26 PM

## 2023-07-18 NOTE — Plan of Care (Signed)

## 2023-07-19 ENCOUNTER — Ambulatory Visit: Admitting: Physician Assistant

## 2023-07-19 DIAGNOSIS — M869 Osteomyelitis, unspecified: Secondary | ICD-10-CM | POA: Diagnosis not present

## 2023-07-19 LAB — CBC
HCT: 23.4 % — ABNORMAL LOW (ref 39.0–52.0)
Hemoglobin: 7.6 g/dL — ABNORMAL LOW (ref 13.0–17.0)
MCH: 27 pg (ref 26.0–34.0)
MCHC: 32.5 g/dL (ref 30.0–36.0)
MCV: 83.3 fL (ref 80.0–100.0)
Platelets: 307 10*3/uL (ref 150–400)
RBC: 2.81 MIL/uL — ABNORMAL LOW (ref 4.22–5.81)
RDW: 17.2 % — ABNORMAL HIGH (ref 11.5–15.5)
WBC: 11.3 10*3/uL — ABNORMAL HIGH (ref 4.0–10.5)
nRBC: 0.2 % (ref 0.0–0.2)

## 2023-07-19 LAB — GLUCOSE, CAPILLARY
Glucose-Capillary: 77 mg/dL (ref 70–99)
Glucose-Capillary: 102 mg/dL — ABNORMAL HIGH (ref 70–99)
Glucose-Capillary: 97 mg/dL (ref 70–99)
Glucose-Capillary: 90 mg/dL (ref 70–99)

## 2023-07-19 LAB — HEMOGLOBIN AND HEMATOCRIT, BLOOD
HCT: 24.5 % — ABNORMAL LOW (ref 39.0–52.0)
Hemoglobin: 7.8 g/dL — ABNORMAL LOW (ref 13.0–17.0)

## 2023-07-19 LAB — PROCALCITONIN: Procalcitonin: 0.39 ng/mL

## 2023-07-19 LAB — BASIC METABOLIC PANEL WITH GFR
Anion gap: 11 (ref 5–15)
BUN: 39 mg/dL — ABNORMAL HIGH (ref 8–23)
CO2: 20 mmol/L — ABNORMAL LOW (ref 22–32)
Calcium: 8.1 mg/dL — ABNORMAL LOW (ref 8.9–10.3)
Chloride: 113 mmol/L — ABNORMAL HIGH (ref 98–111)
Creatinine, Ser: 1.43 mg/dL — ABNORMAL HIGH (ref 0.61–1.24)
GFR, Estimated: 53 mL/min — ABNORMAL LOW (ref >60)
Glucose, Bld: 89 mg/dL (ref 70–99)
Potassium: 3.4 mmol/L — ABNORMAL LOW (ref 3.5–5.1)
Sodium: 144 mmol/L (ref 135–145)

## 2023-07-19 LAB — SURGICAL PATHOLOGY

## 2023-07-19 MED ORDER — POTASSIUM CHLORIDE CRYS ER 20 MEQ PO TBCR
40.0000 meq | EXTENDED_RELEASE_TABLET | Freq: Once | ORAL | Status: AC
  Administered 2023-07-19: 40 meq via ORAL
  Filled 2023-07-19: qty 2

## 2023-07-19 MED ORDER — SODIUM BICARBONATE 650 MG PO TABS
650.0000 mg | ORAL_TABLET | Freq: Three times a day (TID) | ORAL | Status: AC
  Administered 2023-07-19 (×3): 650 mg via ORAL
  Filled 2023-07-19 (×3): qty 1

## 2023-07-19 NOTE — Progress Notes (Signed)
 Progress Note    07/19/2023 11:11 AM 4 Days Post-Op  Subjective:  [name] [name] is a 68 yo male now POD #7 Left femoral artery to posterior tibial artery bypass with in-situ saphenous vein graft and Left profunda femoris endarterectomy with bovine patch angioplasty. . Patient is resting comfortably sitting in the bedside chair eating breakfast. Patient endorses only being able to stand and not able to walk. No other complaints overnight. Recovering as expected. Vitals all remain stable.    Vitals:   07/19/23 0331 07/19/23 0802  BP: (!) 84/53 111/70  Pulse: 74 70  Resp: 18 16  Temp: (!) 97.5 F (36.4 C) 98.2 F (36.8 C)  SpO2: 100% 100%   Physical Exam: Cardiac:  RRR, normal S1 and S2.  No murmurs appreciated. Lungs: Lungs clear on auscultation throughout.  No rales rhonchi or wheezing. Incisions: Right groin and left leg with dressing clean dry and intact.  No hematoma seroma or infection to note. Extremities: Right lower extremity is warm to touch with Doppler PT pulse only.  Left lower extremity with doppler DP and PT pulses and left lower extremity feels warm today. Abdomen: Positive bowel sounds throughout, soft, nontender nondistended. Neurologic: AAOX3, answers all questions and follows commands appropriately. CBC    Component Value Date/Time   WBC 11.3 (H) 07/19/2023 0516   RBC 2.81 (L) 07/19/2023 0516   HGB 7.6 (L) 07/19/2023 0516   HGB 11.7 (L) 08/12/2014 0858   HCT 23.4 (L) 07/19/2023 0516   HCT 37.1 (L) 08/12/2014 0858   PLT 307 07/19/2023 0516   PLT 282 08/12/2014 0858   MCV 83.3 07/19/2023 0516   MCV 72 (L) 08/12/2014 0858   MCH 27.0 07/19/2023 0516   MCHC 32.5 07/19/2023 0516   RDW 17.2 (H) 07/19/2023 0516   RDW 20.6 (H) 08/12/2014 0858   LYMPHSABS 0.8 07/04/2023 1318   LYMPHSABS 0.3 (L) 08/12/2014 0858   MONOABS 0.8 07/04/2023 1318   MONOABS 0.7 08/12/2014 0858   EOSABS 0.2 07/04/2023 1318   EOSABS 0.2 08/12/2014 0858   BASOSABS 0.0 07/04/2023 1318    BASOSABS 0.0 08/12/2014 0858    BMET    Component Value Date/Time   NA 144 07/19/2023 0516   NA 132 (L) 08/12/2014 0858   K 3.4 (L) 07/19/2023 0516   K 4.0 08/12/2014 0858   CL 113 (H) 07/19/2023 0516   CL 100 (L) 08/12/2014 0858   CO2 20 (L) 07/19/2023 0516   CO2 24 08/12/2014 0858   GLUCOSE 89 07/19/2023 0516   GLUCOSE 118 (H) 08/12/2014 0858   BUN 39 (H) 07/19/2023 0516   BUN 30 (H) 08/12/2014 0858   CREATININE 1.43 (H) 07/19/2023 0516   CREATININE 1.52 (H) 08/12/2014 0858   CALCIUM 8.1 (L) 07/19/2023 0516   CALCIUM 8.9 08/12/2014 0858   GFRNONAA 53 (L) 07/19/2023 0516   GFRNONAA 49 (L) 08/12/2014 0858   GFRAA >60 01/10/2020 0940   GFRAA 57 (L) 08/12/2014 0858    INR    Component Value Date/Time   INR 1.3 (H) 07/11/2023 0530     Intake/Output Summary (Last 24 hours) at 07/19/2023 1111 Last data filed at 07/19/2023 1052 Gross per 24 hour  Intake 560 ml  Output 2511 ml  Net -1951 ml     Assessment/Plan:  68 y.o. male is s/p Left femoral artery to posterior tibial artery bypass with in-situ saphenous vein graft and Left profunda femoris endarterectomy with bovine patch angioplasty.  4 Days Post-Op   PLAN Needs more  mobilization. Continue to work with physical therapy.  Family at the bedside wants acute rehab. Continue aspirin statin and Plavix Continue antibiotics while inpatient  DVT prophylaxis:  Heparin 5000 units SQ Every 8 Hours    Jerek Meulemans R Tichina Koebel [facility] 07/19/2023 11:11 AM

## 2023-07-19 NOTE — Progress Notes (Signed)
 Physical Therapy Treatment Patient Details Name: [name] EPPLE [name] MRN: [id number] DOB: [birth year] Today's Date: 07/19/2023   History of Present Illness [name], [name] is a 68 year old male with history of non-insulin-dependent diabetes mellitus, has since stopped taking metformin, hypertension, hyperlipidemia, who presents emergency department for chief concerns of black toes on the left foot. Pt i now is s/pTransmetatarsal amputation left foot and S/P status post left femoral to posterior tibial in situ bypass. NWB to LLW but allowed to WB sligthly on L heel for balance.    PT Comments  Pt received semi reclined in bed agreeable to PT with spouse present. Focus of session today on gait attempts with RLE hop to pattern with RW. Pt with improved STS but still reliant on elevated surface and heavy single person support or min to mod 2 person support to stand. Pt hopping 4' today towards recliner reliant on heavy VC's for L heel touching per podiatrist request. Pt with poor UE strength limiting WB status adherence and progressing gait without support. Pt is improving with turns and safe sitting. Performing listed therex below to assist in improved standing tolerance for transfers and gait. Pt remains a falls risk and continues to have difficulty progressing functional mobility while maintaining LLE WB status. D/c recs remain appropriate.   If plan is discharge home, recommend the following: Two people to help with walking and/or transfers;A lot of help with bathing/dressing/bathroom;Assistance with cooking/housework;Direct supervision/assist for medications management;Assist for transportation;Help with stairs or ramp for entrance   Can travel by private vehicle     No  Equipment Recommendations  Other (comment) (TBD by next venue of care.)    Recommendations for Other Services       Precautions / Restrictions Precautions Precautions: Fall Recall of Precautions/Restrictions:  Intact Restrictions Weight Bearing Restrictions Per Provider Order: Yes LLE Weight Bearing Per Provider Order: Non weight bearing Other Position/Activity Restrictions: WB thru heel for balance     Mobility  Bed Mobility Overal bed mobility: Needs Assistance Bed Mobility: Supine to Sit     Supine to sit: Supervision       Patient Response: Cooperative  Transfers Overall transfer level: Needs assistance Equipment used: Rolling walker (2 wheels) Transfers: Sit to/from Stand Sit to Stand: Mod assist, +2 physical assistance, From elevated surface           General transfer comment: Improved STS effort with greater anterior weight shift.    Ambulation/Gait Ambulation/Gait assistance: Min assist Gait Distance (Feet): 4 Feet Assistive device: Rolling walker (2 wheels)         General Gait Details: hop-to pattern on RLE. Reliant on L heel touching for stedying/balance but needs regualr VC's to ensure compliance.   Stairs             Wheelchair Mobility     Tilt Bed Tilt Bed Patient Response: Cooperative  Modified Rankin (Stroke Patients Only)       Balance Overall balance assessment: Needs assistance Sitting-balance support: No upper extremity supported, Feet supported Sitting balance-Leahy Scale: Good     Standing balance support: Bilateral upper extremity supported Standing balance-Leahy Scale: Poor Standing balance comment: Heavy BUE support on RW needed.                            Communication Communication Communication: No apparent difficulties  Cognition Arousal: Alert Behavior During Therapy: WFL for tasks assessed/performed   PT - Cognitive impairments: No apparent impairments  Following commands: Intact      Cueing Cueing Techniques: Verbal cues, Tactile cues  Exercises General Exercises - Lower Extremity Short Arc Quad: AROM, Strengthening, Right, 10 reps, Seated, Left Straight Leg  Raises: AROM, Strengthening, Right, 10 reps, Supine, Left Other Exercises Other Exercises: 10 total chair dips with R knee extension and NWB compliance on LLE to work on tricep strength for standing, gait and transfers.    General Comments        Pertinent Vitals/Pain Pain Assessment Pain Assessment: No/denies pain    Home Living                          Prior Function            PT Goals (current goals can now be found in the care plan section) Acute Rehab PT Goals Patient Stated Goal: " To get better like before and return home." PT Goal Formulation: With patient Time For Goal Achievement: 07/31/23 Potential to Achieve Goals: Good Progress towards PT goals: Progressing toward goals    Frequency    Min 3X/week      PT Plan      Co-evaluation              AM-PAC PT "6 Clicks" Mobility   Outcome Measure  Help needed turning from your back to your side while in a flat bed without using bedrails?: A Little Help needed moving from lying on your back to sitting on the side of a flat bed without using bedrails?: A Lot Help needed moving to and from a bed to a chair (including a wheelchair)?: A Lot Help needed standing up from a chair using your arms (e.g., wheelchair or bedside chair)?: A Lot Help needed to walk in hospital room?: A Lot Help needed climbing 3-5 steps with a railing? : Total 6 Click Score: 12    End of Session Equipment Utilized During Treatment: Gait belt Activity Tolerance: Patient tolerated treatment well Patient left: in chair;with call bell/phone within reach;with nursing/sitter in room;with family/visitor present Nurse Communication: Mobility status PT Visit Diagnosis: Muscle weakness (generalized) (M62.81);Difficulty in walking, not elsewhere classified (R26.2)     Time: 4098-1191 PT Time Calculation (min) (ACUTE ONLY): 23 min  Charges:    $Gait Training: 8-22 mins $Therapeutic Exercise: 8-22 mins PT General  Charges $$ ACUTE PT VISIT: 1 Visit                     [name] [name]. Fairly IV, PT, DPT Physical Therapist- [place]  [facility]  07/19/2023, 12:54 PM

## 2023-07-19 NOTE — TOC Progression Note (Signed)
 Transition of Care (TOC) - Progression Note    Patient Details  Name: [name] MORADI [name] MRN: [id number] Date of Birth: Apr 08, 1956  Transition [facility]) CM/SW Contact  Truddie Hidden, RN Phone Number: 07/19/2023, 2:14 PM  Clinical Narrative:    Attempt to contact patient's spouse regarding therapy's recommendation for SNF. No answer. Left a message.    3:30pm Spoke with patient's wife regarding therapy's recommendation for  SNF. She is agreeable to SNF and would like [facility] or [facility] as her first choice.         Expected Discharge Plan and Services                                               Social Determinants of Health (SDOH) Interventions SDOH Screenings   Food Insecurity: No Food Insecurity (07/04/2023)  Housing: Low Risk  (07/04/2023)  Transportation Needs: No Transportation Needs (07/04/2023)  Utilities: Not At Risk (07/04/2023)  Social Connections: Moderately Integrated (07/04/2023)  Tobacco Use: Medium Risk (07/15/2023)    Readmission Risk Interventions     No data to display

## 2023-07-19 NOTE — Care Management Important Message (Signed)
 Important Message  Patient Details  Name: [name] ARBOLEDA [name] MRN: [id number] Date of Birth: [birth year]   Important Message Given:  Yes - Medicare IM     Marcell Anger 07/19/2023, 12:20 PM

## 2023-07-19 NOTE — Plan of Care (Signed)

## 2023-07-19 NOTE — Progress Notes (Signed)
 Progress Note   Patient: [name] [name] QMV:[id number] DOB: Sep 10, 1955 DOA: 07/04/2023     15 DOS: the patient was seen and examined on 07/19/2023   Brief hospital course: [name], [name] is a 68 year old male with history of non-insulin-dependent diabetes mellitus, has since stopped taking metformin, hypertension, hyperlipidemia, who presents emergency department for chief concerns of black toes on the left foot.  Vitals in the ED showed temperature of 99, respiration rate of 15, heart rate 70, blood pressure 129/80, SpO2 97% on room air.  Serum sodium is 141, potassium 3.9, chloride 107, bicarb 23, BUN of 24, serum creatinine of 1.26, EGFR greater than 60, nonfasting blood glucose 128, WBC 10.6, hemoglobin 10.8, platelets of 334.  Lactic acid is 0.9.  ED treatment: Zosyn, vancomycin.  Assessment and Plan:  # Left Foot osteomyelitis and gangrene of second, third and fourth toes Blood culture NGTD Continue Unasyn and Zyvox. WBC jumped today to 21.5. Appreciate podiatry input, s/p left foot TMA done on  3/28, continue dressing changed. NWB Left foot. Heel transfer only.   # Severe PAD s/p LLE Bypass done on 07/11/2023 Appreciate vascular surgery input, patient is status post left lower extremity angiogram 03/19 with plans for left lower extremity bypass surgery which was done on 03/24. PT evaluation, out of bed.   # Acute blood loss anemia, postop Hb dropped 6.8 3/28 Hb 6.8, postop, 1 unit PRBC transfused 07/15/23. Monitor H&H and transfuse if hemoglobin less than 7. Iron level 33 low, Tsat 19% wnl, folate WNL,  Continue iron, vit C supplements. 4/1 Hb 7.6, recheck H&H in the afternoon and transfuse if hemoglobin less than 7  # Cardiomyopathy # Chronic systolic CHF, compensated. Patient had complete echo on 05/08/2022: With estimated EF  < 20% Does not appear to be in acute exacerbation at this time. Watch for fluid overload as he is started on IV fluids. Continue  carvedilol with holding parameters, hold spironolactone   # Diabetes mellitus (HCC)  HbA1c 6.3, well-controlled Continue NovoLog sliding scale Continue consistent carbohydrate diet   # History of rectal cancer Continue outpatient follow-up with hematology/oncology   # Hypertension Continue carvedilol and spironolactone Use hydralazine as needed Monitor BP and titrate medications accordingly   # Acute Kidney injury Renal function worsened overnight with low urine output. Creatinine 2.43 from 1.06. S/p IVF hydration. He was eating poor. Aldactone held. Monitor daily renal function. 3/31 Foley catheter got clogged overnight which was flushed and 800 mL urine was collected. Seen by urology, Foley catheter was inserted by urology in the OR on 3/24th  4/1 creatinine 1.43, gradually improving  # Urethral stricture S/p urethral dilation in the OR for a dense bulbar urethral stricture requiring urethral dilation over a wire on 07/11/2023 As per urology: -catheter needs to be in place for at least a week -plans for an outpatient Foley removal and hematuria follow up  -continue to flush prn for decrease in output, bladder pain and/or spasms   Hypokalemia, potassium repleted.    Hypophosphatemia, Phos repleted. Monitor electrolytes and replete as needed   Diarrhea.  Resolved Probably related to antibiotic therapy May use Imodium as needed. Eating poor, states he is drinking well.   Hematuria, off/on  US renal: Possible blood clot in the bladder. Difficult to exclude malignancy.  5 mm nonobstructing right renal stone. Bilateral renal cysts. Hematuria possible due to bilateral renal cyst Difficult placement of Foley catheter per urology. Needs to follow-up with urology for foley removal as outpatient.  3/31 UA shows hematuria   History of gout, c/o left knee gout flareup on 3/23 Left knee noticed swollen on 3/23 Renally adjusted colchicine to 0.3mg .    Out of bed to chair.  Incentive spirometry. Nursing supportive care. Fall, aspiration precautions. Diet:  Diet Orders (From admission, onward)     Start     Ordered   07/15/23 1532  Diet Carb Modified Fluid consistency: Thin; Room service appropriate? Yes  Diet effective now       Question Answer Comment  Diet-HS Snack? Nothing   Calorie Level Medium 1600-2000   Fluid consistency: Thin   Room service appropriate? Yes      07/15/23 1531           DVT prophylaxis: heparin injection 5,000 Units Start: 07/05/23 2200 Place TED hose Start: 07/04/23 1937  Level of care: Telemetry Cardiac   Code Status: Full Code  Subjective: Patient was seen and examined at bedside during morning rounds.  No significant overnight events.  Pain is under control, hematuria resolved.  No active bleeding at this time. Denied any chest pain or palpitation, no shortness of breath. Patient is awaiting for SNF placement. Hb dropped, we will recheck hemoglobin today and transfuse if persistently low hemoglobin.    Physical Exam: Vitals:   07/19/23 0331 07/19/23 0529 07/19/23 0802 07/19/23 1152  BP: (!) 84/53  111/70 93/61  Pulse: 74  70 67  Resp: 18  16 16   Temp: (!) 97.5 F (36.4 C)  98.2 F (36.8 C) 98.1 F (36.7 C)  TempSrc: Oral  Oral   SpO2: 100%  100% 100%  Weight:  76.4 kg    Height:        General - Elderly weak African American male, no apparent distress HEENT - PERRLA, EOMI, atraumatic head, non tender sinuses. Lung - Clear, basal rales, no rhonchi, wheezes. Heart - S1, S2 heard, no murmurs, rubs, trace pedal edema. Abdomen - Soft, non tender, bowel sounds good Neuro - Alert, awake and oriented x 3, non focal exam. Skin - Warm and dry. Left foot dressing intact, s/p left TMA   Data Reviewed:      Latest Ref Rng & Units 07/19/2023    5:16 AM 07/18/2023    4:16 AM 07/17/2023    4:40 AM  CBC  WBC 4.0 - 10.5 K/uL 11.3  16.6  21.5   Hemoglobin 13.0 - 17.0 g/dL 7.6  8.2  9.0   Hematocrit 39.0 - 52.0 %  23.4  24.7  28.0   Platelets 150 - 400 K/uL 307  316  395       Latest Ref Rng & Units 07/19/2023    5:16 AM 07/18/2023    4:16 AM 07/17/2023    4:40 AM  BMP  Glucose 70 - 99 mg/dL 89  81  161   BUN 8 - 23 mg/dL 39  42  34   Creatinine 0.61 - 1.24 mg/dL 0.96  0.45  4.09   Sodium 135 - 145 mmol/L 144  143  141   Potassium 3.5 - 5.1 mmol/L 3.4  3.4  3.8   Chloride 98 - 111 mmol/L 113  109  108   CO2 22 - 32 mmol/L 20  20  17    Calcium 8.9 - 10.3 mg/dL 8.1  8.2  8.4    No results found.   Family Communication: Discussed with patient, wife. They understand and agree. All questions answered.  Disposition: Status is: Inpatient Remains inpatient appropriate because:renal  dysfunction, high WBC, SNF placement  Planned Discharge Destination: Skilled nursing facility     Time spent: 55 minutes  Author: Gillis Santa, MD 07/19/2023 3:17 PM Secure chat 7am to 7pm For on call review [email or web address].

## 2023-07-20 DIAGNOSIS — M869 Osteomyelitis, unspecified: Secondary | ICD-10-CM | POA: Diagnosis not present

## 2023-07-20 LAB — CBC
HCT: 24.3 % — ABNORMAL LOW (ref 39.0–52.0)
Hemoglobin: 7.9 g/dL — ABNORMAL LOW (ref 13.0–17.0)
MCH: 27.6 pg (ref 26.0–34.0)
MCHC: 32.5 g/dL (ref 30.0–36.0)
MCV: 85 fL (ref 80.0–100.0)
Platelets: 277 10*3/uL (ref 150–400)
RBC: 2.86 MIL/uL — ABNORMAL LOW (ref 4.22–5.81)
RDW: 17.4 % — ABNORMAL HIGH (ref 11.5–15.5)
WBC: 9.7 10*3/uL (ref 4.0–10.5)
nRBC: 0.3 % — ABNORMAL HIGH (ref 0.0–0.2)

## 2023-07-20 LAB — AEROBIC/ANAEROBIC CULTURE W GRAM STAIN (SURGICAL/DEEP WOUND)
Culture: NO GROWTH
Gram Stain: NONE SEEN

## 2023-07-20 LAB — GLUCOSE, CAPILLARY
Glucose-Capillary: 82 mg/dL (ref 70–99)
Glucose-Capillary: 90 mg/dL (ref 70–99)
Glucose-Capillary: 88 mg/dL (ref 70–99)
Glucose-Capillary: 111 mg/dL — ABNORMAL HIGH (ref 70–99)

## 2023-07-20 LAB — BASIC METABOLIC PANEL WITH GFR
Anion gap: 10 (ref 5–15)
BUN: 39 mg/dL — ABNORMAL HIGH (ref 8–23)
CO2: 22 mmol/L (ref 22–32)
Calcium: 8.3 mg/dL — ABNORMAL LOW (ref 8.9–10.3)
Chloride: 111 mmol/L (ref 98–111)
Creatinine, Ser: 1.48 mg/dL — ABNORMAL HIGH (ref 0.61–1.24)
GFR, Estimated: 51 mL/min — ABNORMAL LOW (ref >60)
Glucose, Bld: 110 mg/dL — ABNORMAL HIGH (ref 70–99)
Potassium: 3.6 mmol/L (ref 3.5–5.1)
Sodium: 143 mmol/L (ref 135–145)

## 2023-07-20 MED ORDER — COLCHICINE 0.6 MG PO TABS
0.6000 mg | ORAL_TABLET | Freq: Every day | ORAL | Status: DC
  Administered 2023-07-21 – 2023-07-22 (×2): 0.6 mg via ORAL
  Filled 2023-07-20 (×2): qty 1

## 2023-07-20 NOTE — TOC Progression Note (Addendum)
 Transition of Care (TOC) - Progression Note    Patient Details  Name: [name] [name]. MRN: [id number] Date of Birth: [birth year]  Transition of Care [facility]) CM/SW Contact  Truddie Hidden, RN Phone Number: 07/20/2023, 10:29 AM  Clinical Narrative:    Bed search started.   Spoke with patient's wife to give bed offer for [facility] and [facility]. Patient's wife is agreeable to [place]. She was advised Berkley Harvey is still needed for SNF admission.          Expected Discharge Plan and Services                                               Social Determinants of Health (SDOH) Interventions SDOH Screenings   Food Insecurity: No Food Insecurity (07/04/2023)  Housing: Low Risk  (07/04/2023)  Transportation Needs: No Transportation Needs (07/04/2023)  Utilities: Not At Risk (07/04/2023)  Social Connections: Moderately Integrated (07/04/2023)  Tobacco Use: Medium Risk (07/15/2023)    Readmission Risk Interventions     No data to display

## 2023-07-20 NOTE — Plan of Care (Signed)
  Problem: Coping: Goal: Ability to adjust to condition or change in health will improve Outcome: Progressing   Problem: Clinical Measurements: Goal: Respiratory complications will improve Outcome: Adequate for Discharge Goal: Cardiovascular complication will be avoided Outcome: Adequate for Discharge   Problem: Activity: Goal: Risk for activity intolerance will decrease Outcome: Not Progressing   Problem: Elimination: Goal: Will not experience complications related to urinary retention Outcome: Not Progressing

## 2023-07-20 NOTE — Progress Notes (Signed)
 Physical Therapy Treatment Patient Details Name: [name] FARNEY [name] MRN: [id number] DOB: 1955-10-30 Today's Date: 07/20/2023   History of Present Illness [name], [name] is a 68 year old male with history of non-insulin-dependent diabetes mellitus, has since stopped taking metformin, hypertension, hyperlipidemia, who presents emergency department for chief concerns of black toes on the left foot. Pt i now is s/pTransmetatarsal amputation left foot and S/P status post left femoral to posterior tibial in situ bypass. NWB to LLW but allowed to WB sligthly on L heel for balance.    PT Comments  Pt received supine in bed agreeable to PT services. Supervision for bed mobility to EOB. X3 STS efforts performed at EOB working on UE and RLE strength and heel weightbearing status on LLE. ModA+1 needed for all efforts and min to mod VC's for anterior weight shift and heel WB only on LLE. On third rep pt performs with excellent form/technique and able to pivot towards HOB with CGA. Pt returns to supine in bed with all needs in reach. D/c recs remain appropriate.    If plan is discharge home, recommend the following: Two people to help with walking and/or transfers;A lot of help with bathing/dressing/bathroom;Assistance with cooking/housework;Direct supervision/assist for medications management;Assist for transportation;Help with stairs or ramp for entrance   Can travel by private vehicle     No  Equipment Recommendations  Other (comment) (TBD by next venue of care)    Recommendations for Other Services       Precautions / Restrictions Precautions Precautions: Fall Recall of Precautions/Restrictions: Intact Restrictions Weight Bearing Restrictions Per Provider Order: Yes LLE Weight Bearing Per Provider Order: Non weight bearing Other Position/Activity Restrictions: WB thru heel for balance     Mobility  Bed Mobility Overal bed mobility: Needs Assistance Bed Mobility: Supine to Sit, Sit  to Supine     Supine to sit: Supervision Sit to supine: Supervision     Patient Response: Cooperative  Transfers Overall transfer level: Needs assistance Equipment used: Rolling walker (2 wheels) Transfers: Sit to/from Stand Sit to Stand: Mod assist, From elevated surface           General transfer comment: x3 efforts working on LLE NWB and heel WB and BUE and RLE strengthening for transfers and standing    Ambulation/Gait                   Stairs             Wheelchair Mobility     Tilt Bed Tilt Bed Patient Response: Cooperative  Modified Rankin (Stroke Patients Only)       Balance Overall balance assessment: Needs assistance Sitting-balance support: No upper extremity supported, Feet supported Sitting balance-Leahy Scale: Good     Standing balance support: Bilateral upper extremity supported, Reliant on assistive device for balance Standing balance-Leahy Scale: Poor Standing balance comment: Heavy BUE support on RW needed.                            Communication Communication Communication: No apparent difficulties  Cognition Arousal: Alert Behavior During Therapy: WFL for tasks assessed/performed   PT - Cognitive impairments: No apparent impairments                         Following commands: Intact      Cueing Cueing Techniques: Verbal cues, Tactile cues  Exercises      General Comments  Pertinent Vitals/Pain Pain Assessment Pain Assessment: No/denies pain    Home Living                          Prior Function            PT Goals (current goals can now be found in the care plan section) Acute Rehab PT Goals Patient Stated Goal: " To get better like before and return home." PT Goal Formulation: With patient Time For Goal Achievement: 07/31/23 Potential to Achieve Goals: Good Progress towards PT goals: Progressing toward goals    Frequency    Min 3X/week      PT Plan       Co-evaluation              AM-PAC PT "6 Clicks" Mobility   Outcome Measure  Help needed turning from your back to your side while in a flat bed without using bedrails?: A Little Help needed moving from lying on your back to sitting on the side of a flat bed without using bedrails?: A Little Help needed moving to and from a bed to a chair (including a wheelchair)?: A Lot Help needed standing up from a chair using your arms (e.g., wheelchair or bedside chair)?: A Lot Help needed to walk in hospital room?: A Lot Help needed climbing 3-5 steps with a railing? : Total 6 Click Score: 13    End of Session Equipment Utilized During Treatment: Gait belt Activity Tolerance: Patient tolerated treatment well Patient left: in bed;with call bell/phone within reach;with bed alarm set Nurse Communication: Mobility status PT Visit Diagnosis: Muscle weakness (generalized) (M62.81);Difficulty in walking, not elsewhere classified (R26.2)     Time: 4098-1191 PT Time Calculation (min) (ACUTE ONLY): 19 min  Charges:    $Therapeutic Activity: 8-22 mins PT General Charges $$ ACUTE PT VISIT: 1 Visit                     Delphia Grates. Fairly IV, PT, DPT Physical Therapist- [place]  [facility]  07/20/2023, 4:11 PM

## 2023-07-20 NOTE — [place] FL2 (Signed)
 [place] MEDICAID FL2 LEVEL OF CARE FORM     IDENTIFICATION  Patient Name: [name] JOSHUA [name] Birthdate: [birth year] Sex: male Admission Date (Current Location): 07/04/2023  [facility] and [place] Number:  Chiropodist and Address:  [facility], [address] [place], [place] [address]      Provider Number: [id number]  Attending Physician Name and Address:  Gillis Santa, MD  Relative Name and Phone Number:  Jaxten, Brosh (Spouse)  [phone] [facility])    Current Level of Care: Hospital Recommended Level of Care: Skilled Nursing Facility Prior Approval Number:    Date Approved/Denied:   PASRR Number: [id number]  Discharge Plan: SNF    Current Diagnoses: Patient Active Problem List   Diagnosis Date Noted   AKI (acute kidney injury) (HCC) 07/17/2023   Foot osteomyelitis, left (HCC) 07/04/2023   Heart failure with reduced ejection fraction (HCC) 07/04/2023   Acute on chronic systolic CHF (congestive heart failure) (HCC) 05/05/2022   History of rectal cancer 05/05/2022   Stage 3a chronic kidney disease (HCC) 05/05/2022   Cardiomyopathy (HCC) 05/05/2022   Chronic anemia 05/05/2022   Unintentional weight loss 02/08/2022   Elevated CEA 01/20/2020   Microcytic red blood cells 07/10/2019   B12 deficiency 05/26/2018   Abnormal liver function tests 01/03/2017   H/O ileostomy 09/08/2015   Iron deficiency anemia 01/04/2015   Hypomagnesemia 12/26/2014   Atherosclerotic peripheral vascular disease with intermittent claudication (HCC) 10/08/2014   Urinary tract infection 10/06/2014   Rectal cancer (HCC) 08/22/2014   SOB (shortness of breath) 10/01/2013   CAD (coronary artery disease) 09/28/2013   Diabetes (HCC) 09/28/2013   Gout 09/28/2013   HTN (hypertension) 09/28/2013   HLD (hyperlipidemia) 09/28/2013   Diabetes mellitus (HCC) 09/28/2013    Orientation RESPIRATION BLADDER Height & Weight     Self, Time, Situation, Place   Normal External catheter, Incontinent Weight: 76.8 kg Height:  5\' 8"  (172.7 cm)  BEHAVIORAL SYMPTOMS/MOOD NEUROLOGICAL BOWEL NUTRITION STATUS  Other (Comment) (n/a)  (n/a) Incontinent Diet (Carb modified)  AMBULATORY STATUS COMMUNICATION OF NEEDS Skin   Limited Assist Verbally Other (Comment) (Incision to left foot and leg closed with staples)                       Personal Care Assistance Level of Assistance  Bathing, Dressing Bathing Assistance: Limited assistance   Dressing Assistance: Limited assistance     Functional Limitations Info  Sight Sight Info: Impaired        SPECIAL CARE FACTORS FREQUENCY  PT (By licensed PT), OT (By licensed OT)     PT Frequency: Min 2x weekly OT Frequency: Min 2x weekly            Contractures Contractures Info: Not present    Additional Factors Info  Code Status, Allergies Code Status Info: FULL Allergies Info: Entresto (Sacubitril-valsartan), Lisinopril           Current Medications (07/20/2023):  This is the current hospital active medication list Current Facility-Administered Medications  Medication Dose Route Frequency Provider Last Rate Last Admin   ascorbic acid (VITAMIN C) tablet 1,000 mg  1,000 mg Oral Daily Dew, Marlow Baars, MD   1,000 mg at 07/20/23 0900   aspirin EC tablet 81 mg  81 mg Oral Daily Gwyneth Revels, DPM   81 mg at 07/20/23 0900   carvedilol (COREG) tablet 3.125 mg  3.125 mg Oral BID WC Annice Needy, MD   3.125 mg at 07/20/23 0900  Chlorhexidine Gluconate Cloth 2 % PADS 6 each  6 each Topical Daily Gwyneth Revels, DPM   6 each at 07/20/23 1019   clopidogrel (PLAVIX) tablet 75 mg  75 mg Oral Daily Gwyneth Revels, DPM   75 mg at 07/20/23 0901   [START ON 07/21/2023] colchicine tablet 0.6 mg  0.6 mg Oral Daily Gillis Santa, MD       docusate sodium (COLACE) capsule 100 mg  100 mg Oral BID Gwyneth Revels, DPM   100 mg at 07/19/23 2101   docusate sodium (COLACE) capsule 100 mg  100 mg Oral BID PRN Gwyneth Revels,  DPM       feeding supplement (ENSURE ENLIVE / ENSURE PLUS) liquid 237 mL  237 mL Oral BID BM Gwyneth Revels, DPM   237 mL at 07/20/23 1019   heparin injection 5,000 Units  5,000 Units Subcutaneous Q8H Gwyneth Revels, DPM   5,000 Units at 07/20/23 0507   insulin aspart (novoLOG) injection 0-5 Units  0-5 Units Subcutaneous QHS Gwyneth Revels, DPM       insulin aspart (novoLOG) injection 0-9 Units  0-9 Units Subcutaneous TID WC Gwyneth Revels, DPM   1 Units at 07/17/23 1718   loperamide (IMODIUM) capsule 2 mg  2 mg Oral TID PRN Gwyneth Revels, DPM   2 mg at 07/14/23 0813   morphine (PF) 2 MG/ML injection 2 mg  2 mg Intravenous Q3H PRN Gwyneth Revels, DPM   2 mg at 07/16/23 2110   Oral care mouth rinse  15 mL Mouth Rinse PRN Gwyneth Revels, DPM       oxyCODONE (Oxy IR/ROXICODONE) immediate release tablet 5 mg  5 mg Oral Q6H PRN Gwyneth Revels, DPM   5 mg at 07/11/23 0344   oxyCODONE-acetaminophen (PERCOCET/ROXICET) 5-325 MG per tablet 1-2 tablet  1-2 tablet Oral Q6H PRN Gwyneth Revels, DPM   2 tablet at 07/19/23 2101   polyethylene glycol (MIRALAX / GLYCOLAX) packet 17 g  17 g Oral Daily Gwyneth Revels, DPM   17 g at 07/18/23 2440   polyethylene glycol (MIRALAX / GLYCOLAX) packet 17 g  17 g Oral Daily PRN Gwyneth Revels, DPM       pravastatin (PRAVACHOL) tablet 20 mg  20 mg Oral Daily Annice Needy, MD   20 mg at 07/20/23 0900   Facility-Administered Medications Ordered in Other Encounters  Medication Dose Route Frequency Provider Last Rate Last Admin   heparin lock flush 100 unit/mL  500 Units Intravenous Once Corcoran, Melissa C, MD       sodium chloride 0.9 % injection 10 mL  10 mL Intravenous PRN Marin Roberts, MD   10 mL at 08/26/14 1300   sodium chloride flush (NS) 0.9 % injection 10 mL  10 mL Intravenous PRN Rosey Bath, MD         Discharge Medications: Please see discharge summary for a list of discharge medications.  Relevant Imaging Results:  Relevant Lab  Results:   Additional Information SSN [id number]  Truddie Hidden, RN

## 2023-07-20 NOTE — Progress Notes (Signed)
 Heart Failure Nurse Navigator Progress Note   Patient is established with Clarisa Kindred, FNP @ [facility].  Patient had an appointment that was cancelled due to this hospitalization.  Rescheduled patient appointment on 08/08/23 @ 9:30 per patient date and time request since he is waiting on discharge placement at this time.  Navigator will sign off at this time.   Roxy Horseman, RN, BSN [facility] Heart Failure Navigator Secure Chat Only

## 2023-07-20 NOTE — Progress Notes (Signed)
 Progress Note   Patient: [name] [name] BJY:[id number] DOB: 1955-12-06 DOA: 07/04/2023     16 DOS: the patient was seen and examined on 07/20/2023   Brief hospital course: [name], [name] is a 68 year old male with history of non-insulin-dependent diabetes mellitus, has since stopped taking metformin, hypertension, hyperlipidemia, who presents emergency department for chief concerns of black toes on the left foot.  Vitals in the ED showed temperature of 99, respiration rate of 15, heart rate 70, blood pressure 129/80, SpO2 97% on room air.  Serum sodium is 141, potassium 3.9, chloride 107, bicarb 23, BUN of 24, serum creatinine of 1.26, EGFR greater than 60, nonfasting blood glucose 128, WBC 10.6, hemoglobin 10.8, platelets of 334.  Lactic acid is 0.9.  ED treatment: Zosyn, vancomycin.  Assessment and Plan:  # Left Foot osteomyelitis and gangrene of second, third and fourth toes Blood culture NGTD S/pUnasyn and Zyvox, completed 2 weeks course, WBC within normal range.  Resolved source of infection s/p TMA Podiatry consulted, s/p left foot TMA done on  3/28, continue dressing changed. NWB Left foot. Heel transfer only.   # Severe PAD s/p LLE Bypass done on 07/11/2023 Appreciate vascular surgery input, patient is status post left lower extremity angiogram 03/19 with plans for left lower extremity bypass surgery which was done on 03/24. PT evaluation, out of bed.   # Acute blood loss anemia, postop Hb dropped 6.8 3/28 Hb 6.8, postop, 1 unit PRBC transfused 07/15/23. Monitor H&H and transfuse if hemoglobin less than 7. Iron level 33 low, Tsat 19% wnl, folate WNL,  Continue iron, vit C supplements. 4/2 Hb 7.9 remained stable   # Cardiomyopathy # Chronic systolic CHF, compensated. Patient had complete echo on 05/08/2022: With estimated EF  < 20% Does not appear to be in acute exacerbation at this time. Watch for fluid overload as he is started on IV fluids. Continue  carvedilol with holding parameters, hold spironolactone   # Diabetes mellitus (HCC)  HbA1c 6.3, well-controlled Continue NovoLog sliding scale Continue consistent carbohydrate diet   # History of rectal cancer Continue outpatient follow-up with hematology/oncology   # Hypertension Continue carvedilol and spironolactone Use hydralazine as needed Monitor BP and titrate medications accordingly   # Acute Kidney injury Renal function worsened overnight with low urine output. Creatinine 2.43 from 1.06. S/p IVF hydration. He was eating poor. Aldactone held. Monitor daily renal function. 3/31 Foley catheter got clogged overnight which was flushed and 800 mL urine was collected. Seen by urology, Foley catheter was inserted by urology in the OR on 3/24th  4/2 creatinine 1.48 stable Patient was encouraged to drink more fluids.  # Urethral stricture S/p urethral dilation in the OR for a dense bulbar urethral stricture requiring urethral dilation over a wire on 07/11/2023 As per urology: -catheter needs to be in place for at least a week -plans for an outpatient Foley removal and hematuria follow up  -continue to flush prn for decrease in output, bladder pain and/or spasms   Hypokalemia, potassium repleted.    Hypophosphatemia, Phos repleted. Monitor electrolytes and replete as needed   Diarrhea.  Resolved Probably related to antibiotic therapy May use Imodium as needed. Eating poor, states he is drinking well.   Hematuria, off/on  US renal: Possible blood clot in the bladder. Difficult to exclude malignancy.  5 mm nonobstructing right renal stone. Bilateral renal cysts. Hematuria possible due to bilateral renal cyst Difficult placement of Foley catheter per urology. Needs to follow-up with urology  for foley removal as outpatient. 3/31 UA shows hematuria   History of gout, c/o left knee gout flareup on 3/23 Left knee noticed swollen on 3/23 Renally adjusted colchicine to 0.6 mg  from 4/3    Out of bed to chair. Incentive spirometry. Nursing supportive care. Fall, aspiration precautions. Diet:  Diet Orders (From admission, onward)     Start     Ordered   07/15/23 1532  Diet Carb Modified Fluid consistency: Thin; Room service appropriate? Yes  Diet effective now       Question Answer Comment  Diet-HS Snack? Nothing   Calorie Level Medium 1600-2000   Fluid consistency: Thin   Room service appropriate? Yes      07/15/23 1531           DVT prophylaxis: heparin injection 5,000 Units Start: 07/05/23 2200 Place TED hose Start: 07/04/23 1937  Level of care: Telemetry Cardiac   Code Status: Full Code  Subjective: Patient was seen and examined at bedside during morning rounds.  No significant overnight events.  Left lower extremity pain 1-2/10, denied any other complaints.  Patient is awaiting for SNF placement.  Physical Exam: Vitals:   07/19/23 2356 07/20/23 0256 07/20/23 0529 07/20/23 0852  BP: 100/69 110/74  100/64  Pulse: 70 69  79  Resp: 18 18  18   Temp: 98.2 F (36.8 C) 98.4 F (36.9 C)  98 F (36.7 C)  TempSrc:      SpO2: 98% 99%  96%  Weight:   76.8 kg   Height:        General - Elderly weak African American male, no apparent distress HEENT - PERRLA, EOMI, atraumatic head, non tender sinuses. Lung - Clear, basal rales, no rhonchi, wheezes. Heart - S1, S2 heard, no murmurs, rubs, trace pedal edema. Abdomen - Soft, non tender, bowel sounds good Neuro - Alert, awake and oriented x 3, non focal exam. Skin - Warm and dry. Left foot dressing intact, s/p left TMA   Data Reviewed:      Latest Ref Rng & Units 07/20/2023    1:54 AM 07/19/2023    4:01 PM 07/19/2023    5:16 AM  CBC  WBC 4.0 - 10.5 K/uL 9.7   11.3   Hemoglobin 13.0 - 17.0 g/dL 7.9  7.8  7.6   Hematocrit 39.0 - 52.0 % 24.3  24.5  23.4   Platelets 150 - 400 K/uL 277   307       Latest Ref Rng & Units 07/20/2023    1:54 AM 07/19/2023    5:16 AM 07/18/2023    4:16 AM  BMP   Glucose 70 - 99 mg/dL 308  89  81   BUN 8 - 23 mg/dL 39  39  42   Creatinine 0.61 - 1.24 mg/dL 6.57  8.46  9.62   Sodium 135 - 145 mmol/L 143  144  143   Potassium 3.5 - 5.1 mmol/L 3.6  3.4  3.4   Chloride 98 - 111 mmol/L 111  113  109   CO2 22 - 32 mmol/L 22  20  20    Calcium 8.9 - 10.3 mg/dL 8.3  8.1  8.2    No results found.   Family Communication: Discussed with patient, wife. They understand and agree. All questions answered.  Disposition: Status is: Inpatient Remains inpatient appropriate because:renal dysfunction, high WBC, SNF placement  Planned Discharge Destination: Skilled nursing facility     Time spent: 40 minutes  Author: Gillis Santa,  MD 07/20/2023 2:22 PM Secure chat 7am to 7pm For on call review [email or web address].

## 2023-07-21 ENCOUNTER — Encounter: Payer: Self-pay | Admitting: Physician Assistant

## 2023-07-21 DIAGNOSIS — M869 Osteomyelitis, unspecified: Secondary | ICD-10-CM | POA: Diagnosis not present

## 2023-07-21 LAB — BASIC METABOLIC PANEL WITH GFR
Anion gap: 9 (ref 5–15)
BUN: 32 mg/dL — ABNORMAL HIGH (ref 8–23)
CO2: 22 mmol/L (ref 22–32)
Calcium: 8.4 mg/dL — ABNORMAL LOW (ref 8.9–10.3)
Chloride: 113 mmol/L — ABNORMAL HIGH (ref 98–111)
Creatinine, Ser: 1.3 mg/dL — ABNORMAL HIGH (ref 0.61–1.24)
GFR, Estimated: 59 mL/min — ABNORMAL LOW (ref >60)
Glucose, Bld: 90 mg/dL (ref 70–99)
Potassium: 3.4 mmol/L — ABNORMAL LOW (ref 3.5–5.1)
Sodium: 144 mmol/L (ref 135–145)

## 2023-07-21 LAB — CBC
HCT: 24 % — ABNORMAL LOW (ref 39.0–52.0)
Hemoglobin: 7.6 g/dL — ABNORMAL LOW (ref 13.0–17.0)
MCH: 27.5 pg (ref 26.0–34.0)
MCHC: 31.7 g/dL (ref 30.0–36.0)
MCV: 87 fL (ref 80.0–100.0)
Platelets: 301 10*3/uL (ref 150–400)
RBC: 2.76 MIL/uL — ABNORMAL LOW (ref 4.22–5.81)
RDW: 17.6 % — ABNORMAL HIGH (ref 11.5–15.5)
WBC: 9.7 10*3/uL (ref 4.0–10.5)
nRBC: 0.3 % — ABNORMAL HIGH (ref 0.0–0.2)

## 2023-07-21 LAB — GLUCOSE, CAPILLARY
Glucose-Capillary: 83 mg/dL (ref 70–99)
Glucose-Capillary: 85 mg/dL (ref 70–99)
Glucose-Capillary: 75 mg/dL (ref 70–99)
Glucose-Capillary: 86 mg/dL (ref 70–99)

## 2023-07-21 MED ORDER — POTASSIUM CHLORIDE CRYS ER 20 MEQ PO TBCR
20.0000 meq | EXTENDED_RELEASE_TABLET | Freq: Once | ORAL | Status: AC
  Administered 2023-07-21: 20 meq via ORAL
  Filled 2023-07-21: qty 1

## 2023-07-21 MED ORDER — ENOXAPARIN SODIUM 40 MG/0.4ML IJ SOSY
40.0000 mg | PREFILLED_SYRINGE | Freq: Every evening | INTRAMUSCULAR | Status: DC
  Administered 2023-07-21: 40 mg via SUBCUTANEOUS
  Filled 2023-07-21: qty 0.4

## 2023-07-21 NOTE — Progress Notes (Addendum)
 Progress Note   Patient: [name] [name] [id number] DOB: 1955/11/08 DOA: 07/04/2023     17 DOS: the patient was seen and examined on 07/21/2023   Brief hospital course: [name], [name] is a 68 year old male with history of non-insulin-dependent diabetes mellitus, has since stopped taking metformin, hypertension, hyperlipidemia, who presents emergency department for chief concerns of black toes on the left foot.  Vitals in the ED showed temperature of 99, respiration rate of 15, heart rate 70, blood pressure 129/80, SpO2 97% on room air.  Serum sodium is 141, potassium 3.9, chloride 107, bicarb 23, BUN of 24, serum creatinine of 1.26, EGFR greater than 60, nonfasting blood glucose 128, WBC 10.6, hemoglobin 10.8, platelets of 334.  Lactic acid is 0.9.  ED treatment: Zosyn, vancomycin.  Assessment and Plan:  # Left Foot osteomyelitis and gangrene of second, third and fourth toes Blood culture NGTD S/pUnasyn and Zyvox, completed 2 weeks course, WBC within normal range.  Resolved source of infection s/p TMA Podiatry consulted, s/p left foot TMA done on  3/28, continue dressing changed. NWB Left foot. Heel transfer only.   # Severe PAD s/p LLE Bypass done on 07/11/2023 Appreciate vascular surgery input, patient is status post left lower extremity angiogram 03/19 with plans for left lower extremity bypass surgery which was done on 03/24. PT evaluation, out of bed.   # Acute blood loss anemia, postop Hb dropped 6.8 3/28 Hb 6.8, postop, 1 unit PRBC transfused 07/15/23. Monitor H&H and transfuse if hemoglobin less than 7. Iron level 33 low, Tsat 19% wnl, folate WNL,  Continue iron, vit C supplements. 4/3 Hb 7.6 stable   # Cardiomyopathy # Chronic systolic CHF, compensated. Patient had complete echo on 05/08/2022: With estimated EF  < 20% Does not appear to be in acute exacerbation at this time. Watch for fluid overload as he is started on IV fluids. Continue carvedilol  with holding parameters, hold spironolactone   # Diabetes mellitus (HCC)  HbA1c 6.3, well-controlled Continue NovoLog sliding scale Continue consistent carbohydrate diet   # History of rectal cancer Continue outpatient follow-up with hematology/oncology   # Hypertension Continue carvedilol and spironolactone Use hydralazine as needed Monitor BP and titrate medications accordingly   # Acute Kidney injury Renal function worsened overnight with low urine output. Creatinine 2.43 from 1.06. S/p IVF hydration. He was eating poor. Aldactone held. Monitor daily renal function. 3/31 Foley catheter got clogged overnight which was flushed and 800 mL urine was collected. Seen by urology, Foley catheter was inserted by urology in the OR on 3/24th  4/3 creatinine 1.3 stable Patient was encouraged to drink more fluids.  # Urethral stricture S/p urethral dilation in the OR for a dense bulbar urethral stricture requiring urethral dilation over a wire on 07/11/2023 As per urology: -catheter needs to be in place for at least a week -plans for an outpatient Foley removal and hematuria follow up  -continue to flush prn for decrease in output, bladder pain and/or spasms As per Urology Pt scheduled for Monday 4/7 @ 8:00 and 3:00 for voiding trial.    Hypokalemia, potassium repleted.    Hypophosphatemia, Phos repleted. Monitor electrolytes and replete as needed   Diarrhea.  Resolved Probably related to antibiotic therapy May use Imodium as needed. Eating poor, states he is drinking well.   Hematuria, off/on  US renal: Possible blood clot in the bladder. Difficult to exclude malignancy.  5 mm nonobstructing right renal stone. Bilateral renal cysts. Hematuria possible due to  bilateral renal cyst Difficult placement of Foley catheter per urology. Needs to follow-up with urology for foley removal as outpatient. 3/31 UA shows hematuria   History of gout, c/o left knee gout flareup on 3/23 Left  knee noticed swollen on 3/23 Renally adjusted colchicine to 0.6 mg from 4/3    Out of bed to chair. Incentive spirometry. Nursing supportive care. Fall, aspiration precautions. Diet:  Diet Orders (From admission, onward)     Start     Ordered   07/15/23 1532  Diet Carb Modified Fluid consistency: Thin; Room service appropriate? Yes  Diet effective now       Question Answer Comment  Diet-HS Snack? Nothing   Calorie Level Medium 1600-2000   Fluid consistency: Thin   Room service appropriate? Yes      07/15/23 1531           DVT prophylaxis: enoxaparin (LOVENOX) injection 40 mg Start: 07/21/23 1800 Place TED hose Start: 07/04/23 1937  Level of care: Telemetry Cardiac   Code Status: Full Code  Subjective: Patient was seen and examined at bedside during morning rounds.  No significant overnight events.  Left lower extremity pain 3/10, denied any other complaints.  Patient is awaiting for SNF placement.  Physical Exam: Vitals:   07/21/23 0441 07/21/23 0441 07/21/23 0926 07/21/23 1225  BP: 90/65  100/73 109/81  Pulse: 85 83 77 85  Resp:      Temp: 98.7 F (37.1 C)  98.5 F (36.9 C) (!) 97 F (36.1 C)  TempSrc: Oral     SpO2: 100% 100% 99% 100%  Weight:      Height:        General - Elderly weak African American male, no apparent distress HEENT - PERRLA, EOMI, atraumatic head, non tender sinuses. Lung - Clear, basal rales, no rhonchi, wheezes. Heart - S1, S2 heard, no murmurs, rubs, trace pedal edema. Abdomen - Soft, non tender, bowel sounds good Neuro - Alert, awake and oriented x 3, non focal exam. Skin - Warm and dry. Left foot dressing intact, s/p left TMA   Data Reviewed:      Latest Ref Rng & Units 07/21/2023    4:56 AM 07/20/2023    1:54 AM 07/19/2023    4:01 PM  CBC  WBC 4.0 - 10.5 K/uL 9.7  9.7    Hemoglobin 13.0 - 17.0 g/dL 7.6  7.9  7.8   Hematocrit 39.0 - 52.0 % 24.0  24.3  24.5   Platelets 150 - 400 K/uL 301  277        Latest Ref Rng & Units  07/21/2023    4:56 AM 07/20/2023    1:54 AM 07/19/2023    5:16 AM  BMP  Glucose 70 - 99 mg/dL 90  161  89   BUN 8 - 23 mg/dL 32  39  39   Creatinine 0.61 - 1.24 mg/dL 0.96  0.45  4.09   Sodium 135 - 145 mmol/L 144  143  144   Potassium 3.5 - 5.1 mmol/L 3.4  3.6  3.4   Chloride 98 - 111 mmol/L 113  111  113   CO2 22 - 32 mmol/L 22  22  20    Calcium 8.9 - 10.3 mg/dL 8.4  8.3  8.1    No results found.   Family Communication: Discussed with patient, wife. They understand and agree. All questions answered.  Disposition: Status is: Inpatient Remains inpatient appropriate because:renal dysfunction, high WBC, SNF placement  Planned Discharge  Destination: Skilled nursing facility     Time spent: 40 minutes  Author: Gillis Santa, MD 07/21/2023 1:43 PM Secure chat 7am to 7pm For on call review [email or web address].

## 2023-07-21 NOTE — Plan of Care (Signed)
  Problem: Skin Integrity: Goal: Risk for impaired skin integrity will decrease Outcome: Progressing   Problem: Activity: Goal: Risk for activity intolerance will decrease Outcome: Progressing   Problem: Safety: Goal: Ability to remain free from injury will improve Outcome: Progressing   Problem: Fluid Volume: Goal: Ability to maintain a balanced intake and output will improve Outcome: Progressing   Problem: Metabolic: Goal: Ability to maintain appropriate glucose levels will improve Outcome: Progressing   Problem: Skin Integrity: Goal: Risk for impaired skin integrity will decrease Outcome: Progressing   Problem: Health Behavior/Discharge Planning: Goal: Ability to manage health-related needs will improve Outcome: Progressing   Problem: Activity: Goal: Risk for activity intolerance will decrease Outcome: Progressing   Problem: Safety: Goal: Ability to remain free from injury will improve Outcome: Progressing

## 2023-07-21 NOTE — TOC Progression Note (Signed)
 Transition of Care (TOC) - Progression Note    Patient Details  Name: [name] OSBORN [name] MRN: [id number] Date of Birth: 06/17/1955  Transition of Care [facility]) CM/SW Contact  Truddie Hidden, RN Phone Number: 07/21/2023, 2:43 PM  Clinical Narrative:  Per TOC assistant, Nitchia, patient is approved per [employer].    PlanAuth [id number] dates:4/3-07/25/23 next review date:07/25/2023   Patient's wife notified of discharge tot facility tomorrow.        Expected Discharge Plan and Services                                               Social Determinants of Health (SDOH) Interventions SDOH Screenings   Food Insecurity: No Food Insecurity (07/04/2023)  Housing: Low Risk  (07/04/2023)  Transportation Needs: No Transportation Needs (07/04/2023)  Utilities: Not At Risk (07/04/2023)  Social Connections: Moderately Integrated (07/04/2023)  Tobacco Use: Medium Risk (07/15/2023)    Readmission Risk Interventions     No data to display

## 2023-07-22 DIAGNOSIS — M869 Osteomyelitis, unspecified: Secondary | ICD-10-CM | POA: Diagnosis not present

## 2023-07-22 LAB — CBC
HCT: 22.5 % — ABNORMAL LOW (ref 39.0–52.0)
Hemoglobin: 7.1 g/dL — ABNORMAL LOW (ref 13.0–17.0)
MCH: 27.2 pg (ref 26.0–34.0)
MCHC: 31.6 g/dL (ref 30.0–36.0)
MCV: 86.2 fL (ref 80.0–100.0)
Platelets: 248 10*3/uL (ref 150–400)
RBC: 2.61 MIL/uL — ABNORMAL LOW (ref 4.22–5.81)
RDW: 17.8 % — ABNORMAL HIGH (ref 11.5–15.5)
WBC: 9.4 10*3/uL (ref 4.0–10.5)
nRBC: 0.2 % (ref 0.0–0.2)

## 2023-07-22 LAB — GLUCOSE, CAPILLARY
Glucose-Capillary: 74 mg/dL (ref 70–99)
Glucose-Capillary: 83 mg/dL (ref 70–99)
Glucose-Capillary: 114 mg/dL — ABNORMAL HIGH (ref 70–99)

## 2023-07-22 LAB — BASIC METABOLIC PANEL WITH GFR
Anion gap: 10 (ref 5–15)
BUN: 27 mg/dL — ABNORMAL HIGH (ref 8–23)
CO2: 22 mmol/L (ref 22–32)
Calcium: 8.2 mg/dL — ABNORMAL LOW (ref 8.9–10.3)
Chloride: 110 mmol/L (ref 98–111)
Creatinine, Ser: 1.21 mg/dL (ref 0.61–1.24)
GFR, Estimated: >60 mL/min (ref >60)
Glucose, Bld: 71 mg/dL (ref 70–99)
Potassium: 3.4 mmol/L — ABNORMAL LOW (ref 3.5–5.1)
Sodium: 142 mmol/L (ref 135–145)

## 2023-07-22 LAB — PREPARE RBC (CROSSMATCH)

## 2023-07-22 MED ORDER — OXYCODONE HCL 5 MG PO TABS: 5.0000 mg | ORAL_TABLET | Freq: Four times a day (QID) | ORAL | 0 refills | Status: DC | PRN

## 2023-07-22 MED ORDER — DULCOLAX 5 MG PO TBEC: 5.0000 mg | DELAYED_RELEASE_TABLET | Freq: Every day | ORAL | Status: AC | PRN

## 2023-07-22 MED ORDER — POLYETHYLENE GLYCOL 3350 17 G PO PACK: 17.0000 g | PACK | Freq: Every day | ORAL | Status: AC

## 2023-07-22 MED ORDER — CLOPIDOGREL BISULFATE 75 MG PO TABS: 75.0000 mg | ORAL_TABLET | Freq: Every day | ORAL | Status: AC

## 2023-07-22 MED ORDER — ASPIRIN 81 MG PO TBEC: 81.0000 mg | DELAYED_RELEASE_TABLET | Freq: Every day | ORAL | Status: AC

## 2023-07-22 MED ORDER — POTASSIUM CHLORIDE CRYS ER 20 MEQ PO TBCR
40.0000 meq | EXTENDED_RELEASE_TABLET | Freq: Once | ORAL | Status: AC
  Administered 2023-07-22: 40 meq via ORAL
  Filled 2023-07-22: qty 2

## 2023-07-22 MED ORDER — SODIUM CHLORIDE 0.9% IV SOLUTION: Freq: Once | INTRAVENOUS | Status: AC

## 2023-07-22 NOTE — Discharge Summary (Signed)
 [facility] Discharge Summary   Patient: [name] [name] UUV:[id number]  PCP: Lorn Junes, FNP (Inactive)  Date of admission: 07/04/2023   Date of discharge:  07/22/2023     Discharge Diagnoses:  Principal Problem:   Foot osteomyelitis, left (HCC) Active Problems:   Diabetes mellitus (HCC)   Cardiomyopathy (HCC)   CAD (coronary artery disease)   HTN (hypertension)   History of rectal cancer   HLD (hyperlipidemia)   Atherosclerotic peripheral vascular disease with intermittent claudication (HCC)   Iron deficiency anemia   H/O ileostomy   Heart failure with reduced ejection fraction (HCC)   AKI (acute kidney injury) (HCC)   Admitted From: Home Disposition:  SNF   Recommendations for Outpatient Follow-up:  Follow-up with PCP, patient should be seen by an MD in 1 to 2 days, continue to monitor BP and titrate medications accordingly.  Repeat CBC and BMP in 1 week. Follow-up with podiatry in 1 week Follow-up with vascular surgery in 1 week Follow-up with urology, Pt is scheduled for Monday 4/7 @ 8:00 and 3:00 for voiding trial.   Follow up LABS/TEST: CBC and BMP in 1 week   Follow-up Information     Callwood, Gerda Diss D, MD. Go in 2 week(s).   Specialties: Cardiology, Internal Medicine Contact information: [address] [place] [address] [phone]         Georgiana Spinner, NP Follow up in 3 week(s).   Specialty: Vascular Surgery Why: Needs staple removal and Bilat Lower extremity U/S with ABI's Contact information: [address] [place] [address] [phone]         [facility]. Go on 08/08/2023.   Specialty: Cardiology Why: Est CHF patient of Clarisa Kindred.  Rescheduled appt from 07/05/2022 due to recent hospitalization. Contact information: [address] [place] [address] [phone]        Gwyneth Revels, DPM Follow up in 2 week(s).    Specialty: Podiatry Contact information: [address] [place] [address] [phone]                Diet recommendation: Cardiac diet  Activity: The patient is advised to gradually reintroduce usual activities, as tolerated  Discharge Condition: stable  Code Status: Full code   History of present illness: As per the H and P dictated on admission  Hospital Course:  [name] [name], [name] is a 68 year old male with history of non-insulin-dependent diabetes mellitus, has since stopped taking metformin, hypertension, hyperlipidemia, who presents emergency department for chief concerns of black toes on the left foot.   Vitals in the ED showed temperature of 99, respiration rate of 15, heart rate 70, blood pressure 129/80, SpO2 97% on room air.   Serum sodium is 141, potassium 3.9, chloride 107, bicarb 23, BUN of 24, serum creatinine of 1.26, EGFR greater than 60, nonfasting blood glucose 128, WBC 10.6, hemoglobin 10.8, platelets of 334.   Lactic acid is 0.9.   ED treatment: Zosyn, vancomycin.   Assessment and Plan:   # Left Foot osteomyelitis and gangrene of second, third and fourth toes. Now s/p TMA. Blood culture NGTD S/p Unasyn and Zyvox, completed 2 weeks course, WBC within normal range.  Resolved source of infection s/p TMA. Podiatry consulted, s/p left foot TMA done on  3/28, continue dressing changed. NWB Left foot. Heel transfer only. Follow-up with podiatry in 1 week   # Severe PAD s/p LLE Bypass done  on 07/11/2023 Vascular surgery consulted, s/p left lower extremity angiogram 03/19 and left lower extremity bypass surgery was done on 03/24.  Continue aspirin and Plavix. Follow-up with vascular surgery in 1 week.   # Acute blood loss anemia, postop Hb dropped 6.8 3/28 Hb 6.8, postop, 1 unit PRBC transfused 07/15/23. Monitor H&H and transfuse if hemoglobin less than 7. Iron level 33 low, Tsat 19% wnl, folate WNL,  Continue iron, vit C  supplements. 4/4 Hb 7.1 at lower end, patient will benefit from 1 unit of PRBC transfusion, patient agreed.  Transfuse 1 unit of PRBC today. Repeat CBC after 1 week.     # Cardiomyopathy # Chronic systolic CHF, compensated. Patient had complete echo on 05/08/2022: With estimated EF  < 20% Does not appear to be in acute exacerbation at this time. S/p IVF, no signs of volume overload. Continue Coreg.  Follow with cardiology for GDMT as an outpatient when stable. # Hypertension: Continue carvedilol. D/c'd spironolactone due to AKI and dehydration. Use hydralazine as needed. Monitor BP and titrate medications accordingly  # Diabetes mellitus: HbA1c 6.3, well-controlled. S/p NovoLog sliding scale and consistent carbohydrate diet.  Not on any medication. # History of rectal cancer: Continue outpatient follow-up with hematology/oncology # Acute Kidney injury: Renal function worsened overnight with low urine output. Creatinine 2.43 from 1.06. S/p IVF hydration. He was eating poor. Aldactone held. 3/31 Foley catheter got clogged overnight which was flushed and 800 mL urine was collected. Seen by urology, Foley catheter was inserted by urology in the OR on 3/24th. 4/4 today creatinine 1.2 stable. Patient was encouraged to drink more fluids,  1.5 L/day   # Urethral stricture S/p urethral dilation in the OR for a dense bulbar urethral stricture requiring urethral dilation over a wire on 07/11/2023 As per urology: -catheter needs to be in place for at least a week -plans for an outpatient Foley removal and hematuria follow up  -continue to flush prn for decrease in output, bladder pain and/or spasms As per Urology Pt scheduled for Monday 4/7 @ 8:00 and 3:00 for voiding trial.     # Hypokalemia, potassium repleted.   # Hypophosphatemia, Phos repleted.  Resolved # Diarrhea.  Resolved, Probably related to antibiotic therapy. May use Imodium as needed. Eating poor, states he is drinking well. # Hematuria,  off/on  US renal: Possible blood clot in the bladder. Difficult to exclude malignancy.  5 mm nonobstructing right renal stone. Bilateral renal cysts. Hematuria possible due to bilateral renal cyst. Difficult placement of Foley catheter per urology. Needs to follow-up with urology for foley removal as outpatient. 3/31 UA shows hematuria.  No more bleeding noticed.   # History of gout, c/o left knee gout flareup on 3/23 Left knee noticed swollen on 3/23 Renally adjusted colchicine to 0.6 mg from 4/3    Body mass index is 24.77 kg/m.  Nutrition Interventions:   Pain control  - [place] Controlled Substance Reporting System database could not be reviewed as the website was not working. -Oxycodone 10 tablets prescription given as per SNF requirement. - Patient was instructed, not to drive, operate heavy machinery, perform activities at heights, swimming or participation in water activities or provide baby sitting services while on Pain, Sleep and Anxiety Medications; until his outpatient Physician has advised to do so again.  - Also recommended to not to take more than prescribed Pain, Sleep and Anxiety Medications.  Patient was seen by physical therapy, who recommended Therapy, SNF placement, which was arranged. On  the day of the discharge the patient's vitals were stable, and no other acute medical condition were reported by patient. the patient was felt safe to be discharge at SNF with Therapy.  Consultants: Vascular surgery, podiatry Procedures: LLE bypass surgery, left foot TMA  Discharge Exam: General: Appear in no distress, no Rash; Oral Mucosa Clear, moist. Cardiovascular: S1 and S2 Present, no Murmur, Respiratory: normal respiratory effort, Bilateral Air entry present and no Crackles, no wheezes Abdomen: Bowel Sound present, Soft and no tenderness, no hernia Extremities: LLE s/p bypass surgery, staples intact, mild pedal edema, s/p Left foot TMA, dressing CDI.  No calf  tenderness.  Neurology: alert and oriented to time, place, and person affect appropriate.  Filed Weights   07/19/23 0529 07/20/23 0529 07/22/23 0521  Weight: 76.4 kg 76.8 kg 73.9 kg   Vitals:   07/22/23 1259 07/22/23 1316  BP: 103/68 110/67  Pulse:  73  Resp:  18  Temp: 98.4 F (36.9 C) 99.1 F (37.3 C)  SpO2: 100% 100%    DISCHARGE MEDICATION: Allergies as of 07/22/2023       Reactions   Entresto [sacubitril-valsartan] Cough   Lisinopril Cough        Medication List     STOP taking these medications    cloNIDine 0.2 MG tablet Commonly known as: CATAPRES   furosemide 40 MG tablet Commonly known as: LASIX   losartan 25 MG tablet Commonly known as: COZAAR   pravastatin 20 MG tablet Commonly known as: PRAVACHOL   spironolactone 25 MG tablet Commonly known as: ALDACTONE       TAKE these medications    acetaminophen 500 MG tablet Commonly known as: TYLENOL Take 500 mg by mouth every 6 (six) hours as needed.   aspirin EC 81 MG tablet Take 1 tablet (81 mg total) by mouth daily. Swallow whole. Start taking on: July 23, 2023   carvedilol 3.125 MG tablet Commonly known as: COREG Take 3.125 mg by mouth 2 (two) times daily with a meal.   clopidogrel 75 MG tablet Commonly known as: PLAVIX Take 1 tablet (75 mg total) by mouth daily. Start taking on: July 23, 2023   colchicine 0.6 MG tablet Take 0.6 mg by mouth daily.   Dulcolax 5 MG EC tablet Generic drug: bisacodyl Take 1 tablet (5 mg total) by mouth daily as needed for severe constipation.   Iron 325 (65 Fe) MG Tabs Take 1 tablet by mouth every other day.   oxyCODONE 5 MG immediate release tablet Commonly known as: Oxy IR/ROXICODONE Take 1 tablet (5 mg total) by mouth every 6 (six) hours as needed for severe pain (pain score 7-10).   polyethylene glycol 17 g packet Commonly known as: MIRALAX / GLYCOLAX Take 17 g by mouth daily. Start taking on: July 23, 2023   vitamin C 1000 MG tablet Take  1,000 mg by mouth daily. Reported on 09/26/2015               Discharge Care Instructions  (From admission, onward)           Start     Ordered   07/22/23 0000  Discharge wound care:       Comments: As per podiatry   07/22/23 1319   07/16/23 0000  Change dressing        07/16/23 1013           Allergies  Allergen Reactions   Entresto [Sacubitril-Valsartan] Cough   Lisinopril Cough   Discharge Instructions  Call MD for:  difficulty breathing, headache or visual disturbances   Complete by: As directed    Call MD for:  extreme fatigue   Complete by: As directed    Call MD for:  persistant dizziness or light-headedness   Complete by: As directed    Call MD for:  redness, tenderness, or signs of infection (pain, swelling, redness, odor or green/yellow discharge around incision site)   Complete by: As directed    Call MD for:  severe uncontrolled pain   Complete by: As directed    Call MD for:  temperature >100.4   Complete by: As directed    Change dressing   Complete by: As directed    Left foot dressing changes: Twice weekly dressing changes are recommended.  Remove bandage.  Paint incision site with Betadine.  Applied bulky padded dressing.   Diet - low sodium heart healthy   Complete by: As directed    Discharge instructions   Complete by: As directed    Follow-up with PCP, patient should be seen by an MD in 1 to 2 days, continue to monitor BP and titrate medications accordingly.  Repeat CBC and BMP in 1 week. Follow-up with podiatry in 1 week Follow-up with vascular surgery in 1 week   Discharge wound care:   Complete by: As directed    As per podiatry   Increase activity slowly   Complete by: As directed        The results of significant diagnostics from this hospitalization (including imaging, microbiology, ancillary and laboratory) are listed below for reference.    Significant Diagnostic Studies: [place] OR NERVE BLOCK-IMAGE ONLY [facility]) Result  Date: 07/15/2023 There is no interpretation for this exam.  This order is for images obtained during a surgical procedure.  Please See "Surgeries" Tab for more information regarding the procedure.   US RENAL Result Date: 07/10/2023 CLINICAL DATA:  Hematuria. EXAM: RENAL / URINARY TRACT ULTRASOUND COMPLETE COMPARISON:  01/18/2023. FINDINGS: Right Kidney: Renal measurements: 9.7 x 5.3 x 5.0 cm = volume: 135 mL. 5 mm nonobstructing stone. Normal parenchymal echogenicity. 1.4 x 1.3 x 1.7 cm simple appearing cyst. No solid mass or hydronephrosis. Trace fluid adjacent to the lower pole. Left Kidney: Renal measurements: 9.6 x 5.5 x 4.2 cm = volume: 117 mL. 1.7 x 1.5 x 1.3 cm simple appearing cyst. Normal parenchymal echogenicity. No solid mass or hydronephrosis. Bladder: Echogenic structure in the right posterolateral bladder measures 4.2 cm. Other: None. IMPRESSION: 1. Possible blood clot in the bladder. Difficult to exclude malignancy. 2. 5 mm nonobstructing right renal stone. 3. Bilateral renal cysts. Electronically Signed   By: Leanna Battles M.D.   On: 07/10/2023 15:03   ECHOCARDIOGRAM COMPLETE Result Date: 07/06/2023    ECHOCARDIOGRAM REPORT   Patient Name:   [name] SZETO [name] Date of Exam: 07/05/2023 Medical Rec #:  [id number]             Height:       68.0 in Accession #:    [id number]            Weight:       155.5 lb Date of Birth:  04-16-1956             BSA:          1.836 m Patient Age:    67 years              BP:  133/78 mmHg Patient Gender: M                     HR:           66 bpm. Exam Location:  [facility] Procedure: 2D Echo, Cardiac Doppler, Color Doppler and Intracardiac            Opacification Agent (Both Spectral and Color Flow Doppler were            utilized during procedure). Indications:     I50.21 Acute Systolic CHF  History:         Patient has prior history of Echocardiogram examinations, most                  recent 05/08/2022. CHF, CAD and Previous Myocardial Infarction;                   Risk Factors:Diabetes, Dyslipidemia and Hypertension.  Sonographer:     Daphine Deutscher RDCS Referring Phys:  [id number] JXBJY Diagnosing Phys: Mellody Drown Alluri IMPRESSIONS  1. Left ventricular ejection fraction, by estimation, is 20%. The left ventricle has severely decreased function. The left ventricle demonstrates regional wall motion abnormalities (see scoring diagram/findings for description). The left ventricular internal cavity size was mildly to moderately dilated. Left ventricular diastolic parameters are consistent with Grade I diastolic dysfunction (impaired relaxation).  2. Right ventricular systolic function is normal. The right ventricular size is normal.  3. Left atrial size was mildly dilated.  4. The mitral valve is normal in structure. Mild mitral valve regurgitation.  5. The aortic valve is normal in structure. Aortic valve regurgitation is not visualized. FINDINGS  Left Ventricle: Left ventricular ejection fraction, by estimation, is 20%. The left ventricle has severely decreased function. The left ventricle demonstrates regional wall motion abnormalities. Definity contrast agent was given IV to delineate the left  ventricular endocardial borders. The left ventricular internal cavity size was mildly to moderately dilated. There is no left ventricular hypertrophy. Left ventricular diastolic parameters are consistent with Grade I diastolic dysfunction (impaired relaxation).  LV Wall Scoring: The mid and distal anterior septum, entire apex, and mid inferoseptal segment are akinetic. The inferior wall, posterior wall, basal anteroseptal segment, mid anterolateral segment, mid anterior segment, and basal inferoseptal segment are hypokinetic. The basal anterolateral segment and basal anterior segment are normal. Right Ventricle: The right ventricular size is normal. No increase in right ventricular wall thickness. Right ventricular systolic function is normal. Left Atrium: Left  atrial size was mildly dilated. Right Atrium: Right atrial size was normal in size. Pericardium: There is no evidence of pericardial effusion. Mitral Valve: The mitral valve is normal in structure. Mild mitral valve regurgitation. Tricuspid Valve: The tricuspid valve is not well visualized. Tricuspid valve regurgitation is trivial. Aortic Valve: The aortic valve is normal in structure. Aortic valve regurgitation is not visualized. Pulmonic Valve: The pulmonic valve was not well visualized. Pulmonic valve regurgitation is not visualized. Aorta: The aortic root is normal in size and structure. IAS/Shunts: The interatrial septum was not well visualized.  LEFT VENTRICLE PLAX 2D LVIDd:         5.40 cm   Diastology LVIDs:         4.70 cm   LV e' medial:    4.57 cm/s LV PW:         0.90 cm   LV E/e' medial:  8.0 LV IVS:        0.80 cm   LV e' lateral:  4.24 cm/s LVOT diam:     2.10 cm   LV E/e' lateral: 8.7 LV SV:         44 LV SV Index:   24 LVOT Area:     3.46 cm  RIGHT VENTRICLE RV Basal diam:  3.70 cm RV S prime:     11.60 cm/s TAPSE (M-mode): 2.2 cm LEFT ATRIUM             Index        RIGHT ATRIUM          Index LA diam:        4.00 cm 2.18 cm/m   RA Area:     9.39 cm LA Vol (A2C):   78.2 ml 42.58 ml/m  RA Volume:   17.20 ml 9.37 ml/m LA Vol (A4C):   65.1 ml 35.45 ml/m LA Biplane Vol: 72.8 ml 39.64 ml/m  AORTIC VALVE             PULMONIC VALVE LVOT Vmax:   66.57 cm/s  PV Vmax:       0.80 m/s LVOT Vmean:  41.933 cm/s PV Peak grad:  2.5 mmHg LVOT VTI:    0.126 m  AORTA Ao Root diam: 3.70 cm MITRAL VALVE MV Area (PHT): 2.89 cm    SHUNTS MV Decel Time: 263 msec    Systemic VTI:  0.13 m MV E velocity: 36.70 cm/s  Systemic Diam: 2.10 cm MV A velocity: 95.40 cm/s MV E/A ratio:  0.38 [name] [name] Electronically signed by [name] [name] Signature Date/Time: 07/06/2023/11:51:23 AM    Final    PERIPHERAL VASCULAR CATHETERIZATION Result Date: 07/06/2023 See surgical note for result.  DG Chest Port 1 View Result  Date: 07/05/2023 CLINICAL DATA:  Pleural effusion EXAM: PORTABLE CHEST 1 VIEW COMPARISON:  05/05/2022 FINDINGS: Heart borderline in size. Mediastinal contours within normal limits. Lungs clear. No effusions or acute bony abnormality. IMPRESSION: No active disease. Electronically Signed   By: Charlett Nose M.D.   On: 07/05/2023 20:26   US ARTERIAL ABI (SCREENING LOWER EXTREMITY) Result Date: 07/05/2023 CLINICAL DATA:  Per vascular disease, diabetes and gangrene with osteomyelitis of the left foot. EXAM: NONINVASIVE PHYSIOLOGIC VASCULAR STUDY OF BILATERAL LOWER EXTREMITIES TECHNIQUE: Evaluation of both lower extremities were performed at rest, including calculation of ankle-brachial indices with single level Doppler, pressure and pulse volume recording. COMPARISON:  None Available. FINDINGS: Right ABI:  0.53 Left ABI:  No detectable flow to calculate ABI. Right Lower Extremity: Monophasic posterior tibial waveform. No detectable flow in the dorsalis pedis artery. Left Lower Extremity: No detectable flow in either the posterior tibial or dorsalis pedis artery distally. 0.5-0.79 Moderate PAD IMPRESSION: 1. No detectable flow in the left posterior tibial or dorsalis pedis arteries distally. ABI therefore cannot be calculated. 2. Moderately depressed resting ankle-brachial index of 0.53 on the right. Monophasic right posterior tibial artery waveform. No flow detectable in the right dorsalis pedis artery. Electronically Signed   By: Irish Lack M.D.   On: 07/05/2023 12:27   DG Foot Complete Left Result Date: 07/04/2023 CLINICAL DATA:  Necrotic tissue with toes. EXAM: LEFT FOOT - COMPLETE 3+ VIEW COMPARISON:  None Available. FINDINGS: There is near complete erosion of the head of the second metatarsal. There is erosion the head of the third metatarsal along the medial border. Erosion of bone at the base of the second and third proximal phalanges. Bite likely erosions in the head of the first metatarsal. IMPRESSION:  Clear osteomyelitis at the heads of  the second and third metatarsals and adjacent proximal phalanges. Probable osteomyelitis head of the first metatarsal versus aggressive arthropathy Electronically Signed   By: Genevive Bi M.D.   On: 07/04/2023 15:40    Microbiology: Recent Results (from the past 240 hours)  Aerobic/Anaerobic Culture w Gram Stain (surgical/deep wound)     Status: None   Collection Time: 07/15/23 12:49 PM   Specimen: Path Tissue  Result Value Ref Range Status   Specimen Description WOUND  Final   Special Requests GANGRENE LEFT FOOT  Final   Gram Stain NO WBC SEEN NO ORGANISMS SEEN   Final   Culture   Final    No growth aerobically or anaerobically. Performed at [facility], [address], [place], [place] [address]    Report Status 07/20/2023 FINAL  Final     Labs: CBC: Recent Labs  Lab 07/18/23 0416 07/19/23 0516 07/19/23 1601 07/20/23 0154 07/21/23 0456 07/22/23 0526  WBC 16.6* 11.3*  --  9.7 9.7 9.4  HGB 8.2* 7.6* 7.8* 7.9* 7.6* 7.1*  HCT 24.7* 23.4* 24.5* 24.3* 24.0* 22.5*  MCV 84.0 83.3  --  85.0 87.0 86.2  PLT 316 307  --  277 301 248   Basic Metabolic Panel: Recent Labs  Lab 07/16/23 0520 07/17/23 0440 07/18/23 0416 07/19/23 0516 07/20/23 0154 07/21/23 0456 07/22/23 0526  NA 141 141 143 144 143 144 142  K 3.0* 3.8 3.4* 3.4* 3.6 3.4* 3.4*  CL 109 108 109 113* 111 113* 110  CO2 21* 17* 20* 20* 22 22 22   GLUCOSE 127* 134* 81 89 110* 90 71  BUN 24* 34* 42* 39* 39* 32* 27*  CREATININE 1.06 2.43* 2.08* 1.43* 1.48* 1.30* 1.21  CALCIUM 8.2* 8.4* 8.2* 8.1* 8.3* 8.4* 8.2*  MG 2.2 2.2 2.2  --   --   --   --   PHOS 2.5 4.5 2.6  --   --   --   --    Liver Function Tests: No results for input(s): "AST", "ALT", "ALKPHOS", "BILITOT", "PROT", "ALBUMIN" in the last 168 hours. No results for input(s): "LIPASE", "AMYLASE" in the last 168 hours. No results for input(s): "AMMONIA" in the last 168 hours. Cardiac Enzymes: No results for  input(s): "CKTOTAL", "CKMB", "CKMBINDEX", "TROPONINI" in the last 168 hours. BNP (last 3 results) No results for input(s): "BNP" in the last 8760 hours. CBG: Recent Labs  Lab 07/21/23 1227 07/21/23 1716 07/21/23 2101 07/22/23 0836 07/22/23 1140  GLUCAP 85 75 86 74 83    Time spent: 35 minutes  Signed:  Gillis Santa  [facility] 07/22/2023 1:19 PM

## 2023-07-22 NOTE — Plan of Care (Signed)
  Problem: Health Behavior/Discharge Planning: Goal: Ability to manage health-related needs will improve Outcome: Progressing   Problem: Elimination: Goal: Will not experience complications related to bowel motility Outcome: Progressing   Problem: Safety: Goal: Ability to remain free from injury will improve Outcome: Progressing   Problem: Fluid Volume: Goal: Ability to maintain a balanced intake and output will improve Outcome: Progressing

## 2023-07-22 NOTE — TOC Transition Note (Signed)
 Transition of Care [facility]) - Discharge Note   Patient Details  Name: [name] BESSINGER [name] MRN: [id number] Date of Birth: 1956-02-24  [facility]) CM/SW Contact:  Truddie Hidden, RN Phone Number: 07/22/2023, 11:52 AM   Clinical Narrative:     Per facility patient admission confirmed for today. Spoke with Darrian  in admissions Per Darrian $10 copayment must be paid before patient arrives Wife notified of payment requirements. She will call and pay fee.    Patient assigned room # 501 @ [facility] Report will be called to [phone] Face sheet and medical necessity forms printed to the floor to be added to the EMS pack EMS arranged "There are  five ahead of him." Discharge summary and SNF transfer report sent in HUB.  Nurse, and family notified spoke with his wife.    TOC signing off.          Patient Goals and CMS Choice            Discharge Placement                       Discharge Plan and Services Additional resources added to the After Visit Summary for                                       Social Drivers of Health (SDOH) Interventions SDOH Screenings   Food Insecurity: No Food Insecurity (07/04/2023)  Housing: Low Risk  (07/04/2023)  Transportation Needs: No Transportation Needs (07/04/2023)  Utilities: Not At Risk (07/04/2023)  Social Connections: Moderately Integrated (07/04/2023)  Tobacco Use: Medium Risk (07/15/2023)     Readmission Risk Interventions     No data to display

## 2023-07-22 NOTE — Progress Notes (Signed)
 Daily Progress Note   Subjective  - 7 Days Post-Op  Status post transmetatarsal amputation left foot.  Minimal pain.  No complaints today.  Objective Vitals:   07/22/23 0521 07/22/23 0832 07/22/23 1025 07/22/23 1139  BP:  98/68 (!) 99/52 (!) 102/59  Pulse:  76 72 74  Resp:  16 (!) 22 16  Temp:  98.6 F (37 C) 98.8 F (37.1 C) 98.2 F (36.8 C)  TempSrc:   Oral   SpO2:  100%  100%  Weight: 73.9 kg     Height:        Physical Exam: Dressing clean and dry at this time.  No active drainage.  Incisions healing up nicely.     Laboratory CBC    Component Value Date/Time   WBC 9.4 07/22/2023 0526   HGB 7.1 (L) 07/22/2023 0526   HGB 11.7 (L) 08/12/2014 0858   HCT 22.5 (L) 07/22/2023 0526   HCT 37.1 (L) 08/12/2014 0858   PLT 248 07/22/2023 0526   PLT 282 08/12/2014 0858    BMET    Component Value Date/Time   NA 142 07/22/2023 0526   NA 132 (L) 08/12/2014 0858   K 3.4 (L) 07/22/2023 0526   K 4.0 08/12/2014 0858   CL 110 07/22/2023 0526   CL 100 (L) 08/12/2014 0858   CO2 22 07/22/2023 0526   CO2 24 08/12/2014 0858   GLUCOSE 71 07/22/2023 0526   GLUCOSE 118 (H) 08/12/2014 0858   BUN 27 (H) 07/22/2023 0526   BUN 30 (H) 08/12/2014 0858   CREATININE 1.21 07/22/2023 0526   CREATININE 1.52 (H) 08/12/2014 0858   CALCIUM 8.2 (L) 07/22/2023 0526   CALCIUM 8.9 08/12/2014 0858   GFRNONAA >60 07/22/2023 0526   GFRNONAA 49 (L) 08/12/2014 0858   GFRAA >60 01/10/2020 0940   GFRAA 57 (L) 08/12/2014 0858    Assessment/Planning: Status post transmetatarsal amputation for gangrene  Foot is doing well at this time.  Dressing change.  Dressing orders have been placed.  Patient to follow-up in podiatry in 2 weeks upon discharge. Continue with nonweightbearing.  [name] [name] A  07/22/2023, 12:22 PM

## 2023-07-22 NOTE — Plan of Care (Signed)
  Problem: Education: Goal: Ability to describe self-care measures that may prevent or decrease complications (Diabetes Survival Skills Education) will improve Outcome: Completed/Met   Problem: Coping: Goal: Ability to adjust to condition or change in health will improve Outcome: Completed/Met   Problem: Fluid Volume: Goal: Ability to maintain a balanced intake and output will improve Outcome: Completed/Met   Problem: Health Behavior/Discharge Planning: Goal: Ability to identify and utilize available resources and services will improve Outcome: Completed/Met Goal: Ability to manage health-related needs will improve Outcome: Completed/Met   Problem: Metabolic: Goal: Ability to maintain appropriate glucose levels will improve Outcome: Completed/Met   Problem: Nutritional: Goal: Maintenance of adequate nutrition will improve Outcome: Completed/Met Goal: Progress toward achieving an optimal weight will improve Outcome: Completed/Met   Problem: Skin Integrity: Goal: Risk for impaired skin integrity will decrease Outcome: Completed/Met   Problem: Tissue Perfusion: Goal: Adequacy of tissue perfusion will improve Outcome: Completed/Met   Problem: Education: Goal: Knowledge of General Education information will improve Description: Including pain rating scale, medication(s)/side effects and non-pharmacologic comfort measures Outcome: Completed/Met   Problem: Health Behavior/Discharge Planning: Goal: Ability to manage health-related needs will improve Outcome: Completed/Met   Problem: Clinical Measurements: Goal: Ability to maintain clinical measurements within normal limits will improve Outcome: Completed/Met Goal: Will remain free from infection Outcome: Completed/Met Goal: Diagnostic test results will improve Outcome: Completed/Met Goal: Respiratory complications will improve Outcome: Completed/Met Goal: Cardiovascular complication will be avoided Outcome:  Completed/Met   Problem: Activity: Goal: Risk for activity intolerance will decrease Outcome: Completed/Met   Problem: Nutrition: Goal: Adequate nutrition will be maintained Outcome: Completed/Met   Problem: Coping: Goal: Level of anxiety will decrease Outcome: Completed/Met   Problem: Elimination: Goal: Will not experience complications related to bowel motility Outcome: Completed/Met Goal: Will not experience complications related to urinary retention Outcome: Completed/Met   Problem: Pain Managment: Goal: General experience of comfort will improve and/or be controlled Outcome: Completed/Met   Problem: Safety: Goal: Ability to remain free from injury will improve Outcome: Completed/Met   Problem: Skin Integrity: Goal: Risk for impaired skin integrity will decrease Outcome: Completed/Met

## 2023-07-23 LAB — BPAM RBC
Blood Product Expiration Date: 202505052359
ISSUE DATE / TIME: 202504041249
Unit Type and Rh: 6200

## 2023-07-23 LAB — TYPE AND SCREEN
ABO/RH(D): A POS
Antibody Screen: NEGATIVE
Unit division: 0

## 2023-07-25 ENCOUNTER — Ambulatory Visit: Admitting: Physician Assistant

## 2023-07-25 ENCOUNTER — Ambulatory Visit (INDEPENDENT_AMBULATORY_CARE_PROVIDER_SITE_OTHER): Admitting: Physician Assistant

## 2023-07-25 VITALS — BP 146/80 | HR 58 | Ht 67.0 in | Wt 155.0 lb

## 2023-07-25 DIAGNOSIS — N35912 Unspecified bulbous urethral stricture, male: Secondary | ICD-10-CM | POA: Diagnosis not present

## 2023-07-25 DIAGNOSIS — R3129 Other microscopic hematuria: Secondary | ICD-10-CM | POA: Diagnosis not present

## 2023-07-25 LAB — BLADDER SCAN AMB NON-IMAGING: Scan Result: 25 mL

## 2023-07-25 MED ORDER — SULFAMETHOXAZOLE-TRIMETHOPRIM 800-160 MG PO TABS
1.0000 | ORAL_TABLET | Freq: Once | ORAL | Status: AC
  Administered 2023-07-25: 1 via ORAL

## 2023-07-25 NOTE — Progress Notes (Signed)
 Catheter Removal  Patient is present today for a catheter removal.  6.64ml of water was drained from the balloon. A 16FR foley cath was removed from the bladder, no complications were noted. Patient tolerated well.  Performed by: Benay Pike CMA  Follow up/ Additional notes: PVR scan this afternoon

## 2023-07-25 NOTE — Progress Notes (Unsigned)
 07/25/2023 3:31 PM   [name] Ribas [name] June 28, 1955 [id number]  CC: No chief complaint on file.  HPI: [name] MARTEL [name] is a 68 y.o. male with PMH *** who presents today for ***.   Today he reports ***  In-office UA today positive for ***; urine microscopy with *** WBCs/HPF, *** RBCs/HPF, and ***. PVR ***mL.  PMH: Past Medical History:  Diagnosis Date   Atherosclerosis    Cancer (HCC)    CHF (congestive heart failure) (HCC)    Chronic kidney disease    kidney stones, UTI   Colon cancer (HCC)    Coronary artery disease    Diabetes mellitus without complication (HCC)    GERD (gastroesophageal reflux disease)    Gout    Hematuria    Hyperlipidemia    Hypertension    Iron deficiency anemia    Myocardial infarction [facility]) 2005   Peripheral vascular disease (HCC)    Pulmonary nodules    Rectal cancer (HCC)    Ureter, stricture    Wears dentures    full upper    Surgical History: Past Surgical History:  Procedure Laterality Date   BOWEL RESECTION N/A 10/23/2014   Procedure: LOW ANTERIOR BOWEL RESECTION;  Surgeon: Natale Lay, MD;  Location: [facility];  Service: General;  Laterality: N/A;   COLON SURGERY     COLONOSCOPY 06/06/14     COLONOSCOPY WITH ESOPHAGOGASTRODUODENOSCOPY (EGD)     CORONARY ANGIOPLASTY WITH STENT PLACEMENT  03/2004   CYSTOSCOPY WITH STENT PLACEMENT Bilateral 10/23/2014   Procedure: CYSTOSCOPY WITH STENT PLACEMENT,URETHRAL DILATION, LEFT RETROGRADE PYELOGRAM, URETEROSCOPY;  Surgeon: Vanna Scotland, MD;  Location: [facility];  Service: Urology;  Laterality: Bilateral;   DIVERTING ILEOSTOMY N/A 10/23/2014   Procedure: DIVERTING ILEOSTOMY;  Surgeon: Natale Lay, MD;  Location: [facility];  Service: General;  Laterality: N/A;   FEMORAL-TIBIAL BYPASS GRAFT Left 07/11/2023   Procedure: CREATION, BYPASS, ARTERIAL, FEMORAL TO TIBIAL, USING GRAFT, INSITU;  Surgeon: Annice Needy, MD;  Location: [facility];  Service: Vascular;  Laterality: Left;   ILEOSTOMY CLOSURE N/A  09/08/2015   Procedure: ILEOSTOMY TAKEDOWN;  Surgeon: Ricarda Frame, MD;  Location: [facility];  Service: General;  Laterality: N/A;   LAPAROTOMY N/A 10/23/2014   Procedure: EXPLORATORY LAPAROTOMY;  Surgeon: Natale Lay, MD;  Location: [facility];  Service: General;  Laterality: N/A;   LOWER EXTREMITY ANGIOGRAPHY Left 07/06/2023   Procedure: Lower Extremity Angiography;  Surgeon: Annice Needy, MD;  Location: [facility];  Service: Cardiovascular;  Laterality: Left;   PERIPHERAL VASCULAR CATHETERIZATION N/A 05/12/2015   Procedure: Abdominal Aortogram w/Lower Extremity;  Surgeon: Annice Needy, MD;  Location: [facility];  Service: Cardiovascular;  Laterality: N/A;   PERIPHERAL VASCULAR CATHETERIZATION  05/12/2015   Procedure: Lower Extremity Intervention;  Surgeon: Annice Needy, MD;  Location: [facility];  Service: Cardiovascular;;   PERIPHERAL VASCULAR CATHETERIZATION N/A 06/30/2015   Procedure: Abdominal Aortogram w/Lower Extremity;  Surgeon: Annice Needy, MD;  Location: [facility];  Service: Cardiovascular;  Laterality: N/A;   PERIPHERAL VASCULAR CATHETERIZATION  06/30/2015   Procedure: Lower Extremity Intervention;  Surgeon: Annice Needy, MD;  Location: [facility];  Service: Cardiovascular;;   PORT-A-CATH REMOVAL Right 09/08/2015   Procedure: REMOVAL PORT-A-CATH;  Surgeon: Ricarda Frame, MD;  Location: [facility];  Service: General;  Laterality: Right;   PORTACATH PLACEMENT  07/01/2014   Dr. Egbert Garibaldi   TRANSMETATARSAL AMPUTATION Left 07/15/2023   Procedure: AMPUTATION, FOOT,  TRANSMETATARSAL;  Surgeon: Gwyneth Revels, DPM;  Location: [facility];  Service: Orthopedics/Podiatry;  Laterality: Left;    Home Medications:  Allergies as of 07/25/2023       Reactions   Entresto [sacubitril-valsartan] Cough   Lisinopril Cough        Medication List        Accurate as of July 25, 2023  3:31 PM. If you have any questions, ask your nurse or doctor.           acetaminophen 500 MG tablet Commonly known as: TYLENOL Take 500 mg by mouth every 6 (six) hours as needed.   aspirin EC 81 MG tablet Take 1 tablet (81 mg total) by mouth daily. Swallow whole.   carvedilol 3.125 MG tablet Commonly known as: COREG Take 3.125 mg by mouth 2 (two) times daily with a meal.   clopidogrel 75 MG tablet Commonly known as: PLAVIX Take 1 tablet (75 mg total) by mouth daily.   colchicine 0.6 MG tablet Take 0.6 mg by mouth daily.   Dulcolax 5 MG EC tablet Generic drug: bisacodyl Take 1 tablet (5 mg total) by mouth daily as needed for severe constipation.   Iron 325 (65 Fe) MG Tabs Take 1 tablet by mouth every other day.   oxyCODONE 5 MG immediate release tablet Commonly known as: Oxy IR/ROXICODONE Take 1 tablet (5 mg total) by mouth every 6 (six) hours as needed for severe pain (pain score 7-10).   polyethylene glycol 17 g packet Commonly known as: MIRALAX / GLYCOLAX Take 17 g by mouth daily.   vitamin C 1000 MG tablet Take 1,000 mg by mouth daily. Reported on 09/26/2015        Allergies:  Allergies  Allergen Reactions   Entresto [Sacubitril-Valsartan] Cough   Lisinopril Cough    Family History: Family History  Problem Relation Age of Onset   Hypertension Brother    Hypertension Father    Diabetes Father    Diabetes Brother    Hypertension Brother    Heart disease Paternal Grandfather     Social History:   reports that he quit smoking about 38 years ago. His smoking use included cigarettes. He has never used smokeless tobacco. He reports that he does not drink alcohol and does not use drugs.  Physical Exam: BP (!) 146/80   Pulse (!) 58   Ht 5\' 7"  (1.702 m)   Wt 155 lb (70.3 kg)   BMI 24.28 kg/m   Constitutional:  Alert and oriented, no acute distress, nontoxic appearing HEENT: [place], AT Cardiovascular: No clubbing, cyanosis, or edema Respiratory: Normal respiratory effort, no increased work of breathing Skin: No rashes,  bruises or suspicious lesions Neurologic: Grossly intact, no focal deficits, moving all 4 extremities Psychiatric: Normal mood and affect  Laboratory Data: Results for orders placed or performed in visit on 07/25/23  BLADDER SCAN AMB NON-IMAGING   Collection Time: 07/25/23  3:33 PM  Result Value Ref Range   Scan Result 25 ml   Assessment & Plan:   1. Stricture of bulbous urethra in male, unspecified stricture type (Primary) *** - BLADDER SCAN AMB NON-IMAGING   Return in about 4 weeks (around 08/22/2023) for IPSS, PVR.  Carman Ching, PA-C  [facility] [place] [address] [place], [place] [address] [phone]

## 2023-08-02 ENCOUNTER — Ambulatory Visit (INDEPENDENT_AMBULATORY_CARE_PROVIDER_SITE_OTHER): Admitting: Vascular Surgery

## 2023-08-02 ENCOUNTER — Encounter (INDEPENDENT_AMBULATORY_CARE_PROVIDER_SITE_OTHER): Payer: Self-pay | Admitting: Vascular Surgery

## 2023-08-02 VITALS — BP 123/76 | HR 67 | Resp 18 | Ht 68.0 in | Wt 166.0 lb

## 2023-08-02 DIAGNOSIS — I10 Essential (primary) hypertension: Secondary | ICD-10-CM

## 2023-08-02 DIAGNOSIS — I70262 Atherosclerosis of native arteries of extremities with gangrene, left leg: Secondary | ICD-10-CM

## 2023-08-02 DIAGNOSIS — L97509 Non-pressure chronic ulcer of other part of unspecified foot with unspecified severity: Secondary | ICD-10-CM

## 2023-08-02 DIAGNOSIS — E11621 Type 2 diabetes mellitus with foot ulcer: Secondary | ICD-10-CM

## 2023-08-02 DIAGNOSIS — I70269 Atherosclerosis of native arteries of extremities with gangrene, unspecified extremity: Secondary | ICD-10-CM | POA: Insufficient documentation

## 2023-08-02 NOTE — Assessment & Plan Note (Signed)
 blood glucose control important in reducing the progression of atherosclerotic disease. Also, involved in wound healing. On appropriate medications.

## 2023-08-02 NOTE — Assessment & Plan Note (Signed)
 Patient underwent surgical revascularization with femoral to tibial bypass and profunda femoris endarterectomy.  His perfusion seems improved and we will get a duplex on him in the next week or 2.  Half of his staples were removed today and we will remove the rest when he returns for his duplex.  Would like to increase his activity as tolerated once he is cleared by the podiatrist.  He sees them later this week.  Elevate his leg is much as possible for the swelling.

## 2023-08-02 NOTE — Progress Notes (Signed)
 Patient ID: [name] CHIARAMONTE [name], male   DOB: [birth year], 67 y.o.   MRN: [id number]  Chief Complaint  Patient presents with   New Patient (Initial Visit)    NP (Vascular Surgery) in 3 weeks (08/12/2023); Needs staple removal and Bilat Lower extremity U/S with ABI's    HPI [name] NORDLING [name] is a 68 y.o. male.  Patient returns about 3 weeks after left femoral to tibial bypass and a left profunda femoris endarterectomy for limb threatening ischemia with gangrenous changes.  He is doing reasonably well.  His leg is warm.  He has significant swelling from reperfusion in the left lower extremity.  His incisions are all clean, dry, and intact.  He sees the podiatrist later this week in regards to his foot.  He is still nonweightbearing on the left foot.   Past Medical History:  Diagnosis Date   Atherosclerosis    Cancer (HCC)    CHF (congestive heart failure) (HCC)    Chronic kidney disease    kidney stones, UTI   Colon cancer (HCC)    Coronary artery disease    Diabetes mellitus without complication (HCC)    GERD (gastroesophageal reflux disease)    Gout    Hematuria    Hyperlipidemia    Hypertension    Iron deficiency anemia    Myocardial infarction [facility]) 2005   Peripheral vascular disease (HCC)    Pulmonary nodules    Rectal cancer (HCC)    Ureter, stricture    Wears dentures    full upper    Past Surgical History:  Procedure Laterality Date   BOWEL RESECTION N/A 10/23/2014   Procedure: LOW ANTERIOR BOWEL RESECTION;  Surgeon: Natale Lay, MD;  Location: [facility];  Service: General;  Laterality: N/A;   COLON SURGERY     COLONOSCOPY 06/06/14     COLONOSCOPY WITH ESOPHAGOGASTRODUODENOSCOPY (EGD)     CORONARY ANGIOPLASTY WITH STENT PLACEMENT  03/2004   CYSTOSCOPY WITH STENT PLACEMENT Bilateral 10/23/2014   Procedure: CYSTOSCOPY WITH STENT PLACEMENT,URETHRAL DILATION, LEFT RETROGRADE PYELOGRAM, URETEROSCOPY;  Surgeon: Vanna Scotland, MD;  Location: [facility];  Service: Urology;   Laterality: Bilateral;   DIVERTING ILEOSTOMY N/A 10/23/2014   Procedure: DIVERTING ILEOSTOMY;  Surgeon: Natale Lay, MD;  Location: [facility];  Service: General;  Laterality: N/A;   FEMORAL-TIBIAL BYPASS GRAFT Left 07/11/2023   Procedure: CREATION, BYPASS, ARTERIAL, FEMORAL TO TIBIAL, USING GRAFT, INSITU;  Surgeon: Annice Needy, MD;  Location: [facility];  Service: Vascular;  Laterality: Left;   ILEOSTOMY CLOSURE N/A 09/08/2015   Procedure: ILEOSTOMY TAKEDOWN;  Surgeon: Ricarda Frame, MD;  Location: [facility];  Service: General;  Laterality: N/A;   LAPAROTOMY N/A 10/23/2014   Procedure: EXPLORATORY LAPAROTOMY;  Surgeon: Natale Lay, MD;  Location: [facility];  Service: General;  Laterality: N/A;   LOWER EXTREMITY ANGIOGRAPHY Left 07/06/2023   Procedure: Lower Extremity Angiography;  Surgeon: Annice Needy, MD;  Location: [facility];  Service: Cardiovascular;  Laterality: Left;   PERIPHERAL VASCULAR CATHETERIZATION N/A 05/12/2015   Procedure: Abdominal Aortogram w/Lower Extremity;  Surgeon: Annice Needy, MD;  Location: [facility];  Service: Cardiovascular;  Laterality: N/A;   PERIPHERAL VASCULAR CATHETERIZATION  05/12/2015   Procedure: Lower Extremity Intervention;  Surgeon: Annice Needy, MD;  Location: [facility];  Service: Cardiovascular;;   PERIPHERAL VASCULAR CATHETERIZATION N/A 06/30/2015   Procedure: Abdominal Aortogram w/Lower Extremity;  Surgeon: Annice Needy, MD;  Location: [facility];  Service: Cardiovascular;  Laterality: N/A;   PERIPHERAL VASCULAR CATHETERIZATION  06/30/2015   Procedure: Lower Extremity Intervention;  Surgeon: Celso College, MD;  Location: [facility];  Service: Cardiovascular;;   PORT-A-CATH REMOVAL Right 09/08/2015   Procedure: REMOVAL PORT-A-CATH;  Surgeon: Gwyndolyn Lerner, MD;  Location: [facility];  Service: General;  Laterality: Right;   PORTACATH PLACEMENT  07/01/2014   Dr. Adele Admire   TRANSMETATARSAL AMPUTATION Left 07/15/2023   Procedure:  AMPUTATION, FOOT, TRANSMETATARSAL;  Surgeon: Anell Baptist, DPM;  Location: [facility];  Service: Orthopedics/Podiatry;  Laterality: Left;      Allergies  Allergen Reactions   Entresto [Sacubitril-Valsartan] Cough   Lisinopril Cough    Current Outpatient Medications  Medication Sig Dispense Refill   acetaminophen (TYLENOL) 500 MG tablet Take 500 mg by mouth every 6 (six) hours as needed.     Ascorbic Acid (VITAMIN C) 1000 MG tablet Take 1,000 mg by mouth daily. Reported on 09/26/2015     aspirin EC 81 MG tablet Take 1 tablet (81 mg total) by mouth daily. Swallow whole.     bisacodyl (DULCOLAX) 5 MG EC tablet Take 1 tablet (5 mg total) by mouth daily as needed for severe constipation.     carvedilol (COREG) 3.125 MG tablet Take 3.125 mg by mouth 2 (two) times daily with a meal.     clopidogrel (PLAVIX) 75 MG tablet Take 1 tablet (75 mg total) by mouth daily.     colchicine 0.6 MG tablet Take 0.6 mg by mouth daily.     Ferrous Sulfate (IRON) 325 (65 Fe) MG TABS Take 1 tablet by mouth every other day.     oxyCODONE (OXY IR/ROXICODONE) 5 MG immediate release tablet Take 1 tablet (5 mg total) by mouth every 6 (six) hours as needed for severe pain (pain score 7-10). 10 tablet 0   polyethylene glycol (MIRALAX / GLYCOLAX) 17 g packet Take 17 g by mouth daily.     No current facility-administered medications for this visit.   Facility-Administered Medications Ordered in Other Visits  Medication Dose Route Frequency Provider Last Rate Last Admin   heparin lock flush 100 unit/mL  500 Units Intravenous Once Corcoran, Melissa C, MD       sodium chloride 0.9 % injection 10 mL  10 mL Intravenous PRN Gittin, Robert G, MD   10 mL at 08/26/14 1300   sodium chloride flush (NS) 0.9 % injection 10 mL  10 mL Intravenous PRN Corcoran, Melissa C, MD            Physical Exam BP 123/76   Pulse 67   Resp 18   Ht 5\' 8"  (1.727 m)   Wt 166 lb (75.3 kg)   BMI 25.24 kg/m  Gen:  WD/WN, NAD Skin: incision  C/D/I.  Half of the staples were removed today.  He has 2-3+ left lower extremity edema.     Assessment/Plan:  Atherosclerosis of native arteries of the extremities with gangrene [facility]) Patient underwent surgical revascularization with femoral to tibial bypass and profunda femoris endarterectomy.  His perfusion seems improved and we will get a duplex on him in the next week or 2.  Half of his staples were removed today and we will remove the rest when he returns for his duplex.  Would like to increase his activity as tolerated once he is cleared by the podiatrist.  He sees them later this week.  Elevate his leg is much as possible for the swelling.  HTN (hypertension) blood pressure  control important in reducing the progression of atherosclerotic disease. On appropriate oral medications.   Diabetes mellitus (HCC) blood glucose control important in reducing the progression of atherosclerotic disease. Also, involved in wound healing. On appropriate medications.      Mikki Alexander 08/02/2023, 9:15 AM   This note was created with Dragon medical transcription system.  Any errors from dictation are unintentional.

## 2023-08-02 NOTE — Assessment & Plan Note (Signed)
 blood pressure control important in reducing the progression of atherosclerotic disease. On appropriate oral medications.

## 2023-08-03 ENCOUNTER — Other Ambulatory Visit (INDEPENDENT_AMBULATORY_CARE_PROVIDER_SITE_OTHER): Payer: Self-pay | Admitting: Vascular Surgery

## 2023-08-03 DIAGNOSIS — I739 Peripheral vascular disease, unspecified: Secondary | ICD-10-CM

## 2023-08-08 ENCOUNTER — Ambulatory Visit (INDEPENDENT_AMBULATORY_CARE_PROVIDER_SITE_OTHER): Admitting: Nurse Practitioner

## 2023-08-08 ENCOUNTER — Ambulatory Visit (INDEPENDENT_AMBULATORY_CARE_PROVIDER_SITE_OTHER)

## 2023-08-08 ENCOUNTER — Encounter (INDEPENDENT_AMBULATORY_CARE_PROVIDER_SITE_OTHER): Payer: Self-pay | Admitting: Nurse Practitioner

## 2023-08-08 ENCOUNTER — Encounter: Admitting: Family

## 2023-08-08 VITALS — BP 125/76 | HR 58 | Resp 16

## 2023-08-08 DIAGNOSIS — I70262 Atherosclerosis of native arteries of extremities with gangrene, left leg: Secondary | ICD-10-CM

## 2023-08-08 DIAGNOSIS — Z9889 Other specified postprocedural states: Secondary | ICD-10-CM | POA: Diagnosis not present

## 2023-08-08 DIAGNOSIS — I739 Peripheral vascular disease, unspecified: Secondary | ICD-10-CM | POA: Diagnosis not present

## 2023-08-09 NOTE — Progress Notes (Signed)
 Subjective:    Patient ID: [name] Hamel [name], male    DOB: 1956-01-21, 68 y.o.   MRN: [id number] Chief Complaint  Patient presents with   Routine Post Op    [facility] 1 week post op     [name] is a 68 y.o. male.  Patient returns about 3 weeks after left femoral to tibial bypass and a left profunda femoris endarterectomy for limb threatening ischemia with gangrenous changes.  He is doing reasonably well.  His leg is warm.  He has significant swelling from reperfusion in the left lower extremity.  His incisions are all clean, dry, and intact.  He continues to be nonweightbearing he currently has a femoropopliteal bypass graft with no evidence of significant stenosis based on ultrasound today but there is also a 6.8 x 2.9 x 2.1 hematoma noted in the proximal to mid calf.  They were unable to image the groin due to patient being in a wheelchair but there is also noted hematoma there as well.    Review of Systems  Cardiovascular:  Positive for leg swelling.  Skin:  Positive for wound.  All other systems reviewed and are negative.      Objective:   Physical Exam Vitals reviewed.  HENT:     Head: Normocephalic.  Cardiovascular:     Rate and Rhythm: Normal rate.     Pulses:          Dorsalis pedis pulses are detected w/ Doppler on the left side.       Posterior tibial pulses are detected w/ Doppler on the left side.  Pulmonary:     Effort: Pulmonary effort is normal.  Skin:    General: Skin is warm and dry.  Neurological:     Mental Status: He is alert and oriented to person, place, and time.  Psychiatric:        Mood and Affect: Mood normal.        Behavior: Behavior normal.        Thought Content: Thought content normal.        Judgment: Judgment normal.     BP 125/76   Pulse (!) 58   Resp 16   Past Medical History:  Diagnosis Date   Atherosclerosis    Cancer (HCC)    CHF (congestive heart failure) (HCC)    Chronic kidney disease    kidney stones, UTI    Colon cancer (HCC)    Coronary artery disease    Diabetes mellitus without complication (HCC)    GERD (gastroesophageal reflux disease)    Gout    Hematuria    Hyperlipidemia    Hypertension    Iron deficiency anemia    Myocardial infarction [facility]) 2005   Peripheral vascular disease (HCC)    Pulmonary nodules    Rectal cancer (HCC)    Ureter, stricture    Wears dentures    full upper    Social History   Socioeconomic History   Marital status: Married    Spouse name: Not on file   Number of children: Not on file   Years of education: Not on file   Highest education level: Not on file  Occupational History   Not on file  Tobacco Use   Smoking status: Former    Current packs/day: 0.00    Types: Cigarettes    Quit date: 11/19/1984    Years since quitting: 38.7   Smokeless tobacco: Never   Tobacco comments:  smoked for brief period in his teens  Vaping Use   Vaping status: Never Used  Substance and Sexual Activity   Alcohol use: No    Alcohol/week: 0.0 standard drinks of alcohol   Drug use: No   Sexual activity: Not Currently  Other Topics Concern   Not on file  Social History Narrative   Not on file   Social Drivers of Health   Financial Resource Strain: Not on file  Food Insecurity: No Food Insecurity (07/04/2023)   Hunger Vital Sign    Worried About Running Out of Food in the Last Year: Never true    Ran Out of Food in the Last Year: Never true  Transportation Needs: No Transportation Needs (07/04/2023)   PRAPARE - Administrator, Civil Service (Medical): No    Lack of Transportation (Non-Medical): No  Physical Activity: Not on file  Stress: Not on file  Social Connections: Moderately Integrated (07/04/2023)   Social Connection and Isolation Panel [NHANES]    Frequency of Communication with Friends and Family: Three times a week    Frequency of Social Gatherings with Friends and Family: More than three times a week    Attends Religious  Services: More than 4 times per year    Active Member of Clubs or Organizations: No    Attends Banker Meetings: Never    Marital Status: Married  Catering manager Violence: Not At Risk (07/04/2023)   Humiliation, Afraid, Rape, and Kick questionnaire    Fear of Current or Ex-Partner: No    Emotionally Abused: No    Physically Abused: No    Sexually Abused: No    Past Surgical History:  Procedure Laterality Date   BOWEL RESECTION N/A 10/23/2014   Procedure: LOW ANTERIOR BOWEL RESECTION;  Surgeon: Lauretta Ponto, MD;  Location: [facility] ORS;  Service: General;  Laterality: N/A;   COLON SURGERY     COLONOSCOPY 06/06/14     COLONOSCOPY WITH ESOPHAGOGASTRODUODENOSCOPY (EGD)     CORONARY ANGIOPLASTY WITH STENT PLACEMENT  03/2004   CYSTOSCOPY WITH STENT PLACEMENT Bilateral 10/23/2014   Procedure: CYSTOSCOPY WITH STENT PLACEMENT,URETHRAL DILATION, LEFT RETROGRADE PYELOGRAM, URETEROSCOPY;  Surgeon: Dustin Gimenez, MD;  Location: [facility];  Service: Urology;  Laterality: Bilateral;   DIVERTING ILEOSTOMY N/A 10/23/2014   Procedure: DIVERTING ILEOSTOMY;  Surgeon: Lauretta Ponto, MD;  Location: [facility];  Service: General;  Laterality: N/A;   FEMORAL-TIBIAL BYPASS GRAFT Left 07/11/2023   Procedure: CREATION, BYPASS, ARTERIAL, FEMORAL TO TIBIAL, USING GRAFT, INSITU;  Surgeon: Celso College, MD;  Location: [facility];  Service: Vascular;  Laterality: Left;   ILEOSTOMY CLOSURE N/A 09/08/2015   Procedure: ILEOSTOMY TAKEDOWN;  Surgeon: Gwyndolyn Lerner, MD;  Location: [facility];  Service: General;  Laterality: N/A;   LAPAROTOMY N/A 10/23/2014   Procedure: EXPLORATORY LAPAROTOMY;  Surgeon: Lauretta Ponto, MD;  Location: [facility];  Service: General;  Laterality: N/A;   LOWER EXTREMITY ANGIOGRAPHY Left 07/06/2023   Procedure: Lower Extremity Angiography;  Surgeon: Celso College, MD;  Location: [facility];  Service: Cardiovascular;  Laterality: Left;   PERIPHERAL VASCULAR CATHETERIZATION N/A 05/12/2015   Procedure:  Abdominal Aortogram w/Lower Extremity;  Surgeon: Celso College, MD;  Location: [facility];  Service: Cardiovascular;  Laterality: N/A;   PERIPHERAL VASCULAR CATHETERIZATION  05/12/2015   Procedure: Lower Extremity Intervention;  Surgeon: Celso College, MD;  Location: [facility];  Service: Cardiovascular;;   PERIPHERAL VASCULAR CATHETERIZATION N/A 06/30/2015   Procedure: Abdominal  Aortogram w/Lower Extremity;  Surgeon: Celso College, MD;  Location: [facility];  Service: Cardiovascular;  Laterality: N/A;   PERIPHERAL VASCULAR CATHETERIZATION  06/30/2015   Procedure: Lower Extremity Intervention;  Surgeon: Celso College, MD;  Location: [facility];  Service: Cardiovascular;;   PORT-A-CATH REMOVAL Right 09/08/2015   Procedure: REMOVAL PORT-A-CATH;  Surgeon: Gwyndolyn Lerner, MD;  Location: [facility];  Service: General;  Laterality: Right;   PORTACATH PLACEMENT  07/01/2014   Dr. Adele Admire   TRANSMETATARSAL AMPUTATION Left 07/15/2023   Procedure: AMPUTATION, FOOT, TRANSMETATARSAL;  Surgeon: Anell Baptist, DPM;  Location: [facility];  Service: Orthopedics/Podiatry;  Laterality: Left;    Family History  Problem Relation Age of Onset   Hypertension Brother    Hypertension Father    Diabetes Father    Diabetes Brother    Hypertension Brother    Heart disease Paternal Grandfather     Allergies  Allergen Reactions   Entresto  [Sacubitril -Valsartan ] Cough   Lisinopril  Cough       Latest Ref Rng & Units 07/22/2023    5:26 AM 07/21/2023    4:56 AM 07/20/2023    1:54 AM  CBC  WBC 4.0 - 10.5 K/uL 9.4  9.7  9.7   Hemoglobin 13.0 - 17.0 g/dL 7.1  7.6  7.9   Hematocrit 39.0 - 52.0 % 22.5  24.0  24.3   Platelets 150 - 400 K/uL 248  301  277       CMP     Component Value Date/Time   NA 142 07/22/2023 0526   NA 132 (L) 08/12/2014 0858   K 3.4 (L) 07/22/2023 0526   K 4.0 08/12/2014 0858   CL 110 07/22/2023 0526   CL 100 (L) 08/12/2014 0858   CO2 22 07/22/2023 0526   CO2 24  08/12/2014 0858   GLUCOSE 71 07/22/2023 0526   GLUCOSE 118 (H) 08/12/2014 0858   BUN 27 (H) 07/22/2023 0526   BUN 30 (H) 08/12/2014 0858   CREATININE 1.21 07/22/2023 0526   CREATININE 1.52 (H) 08/12/2014 0858   CALCIUM  8.2 (L) 07/22/2023 0526   CALCIUM  8.9 08/12/2014 0858   PROT 7.3 07/04/2023 1318   PROT 7.1 08/12/2014 0858   ALBUMIN  3.7 07/04/2023 1318   ALBUMIN  3.7 08/12/2014 0858   AST 14 (L) 07/04/2023 1318   AST 16 08/12/2014 0858   ALT 15 07/04/2023 1318   ALT 15 (L) 08/12/2014 0858   ALKPHOS 97 07/04/2023 1318   ALKPHOS 69 08/12/2014 0858   BILITOT 0.8 07/04/2023 1318   BILITOT 0.6 08/12/2014 0858   GFRNONAA >60 07/22/2023 0526   GFRNONAA 49 (L) 08/12/2014 0858     No results found.     Assessment & Plan:   1. Atherosclerosis of native artery of left lower extremity with gangrene (HCC) (Primary) Staples removed today.  Wounds are clean dry and intact.  Steri-Strips applied.  Does have a large hematoma in his groin but is not affecting overall flow.  He has adequate perfusion currently post bypass.  Will have patient return in approximately 6 weeks to reevaluate in the progress of healing.   Current Outpatient Medications on File Prior to Visit  Medication Sig Dispense Refill   acetaminophen  (TYLENOL ) 500 MG tablet Take 500 mg by mouth every 6 (six) hours as needed.     Ascorbic Acid  (VITAMIN C ) 1000 MG tablet Take 1,000 mg by mouth daily. Reported on 09/26/2015     aspirin  EC 81 MG tablet Take 1  tablet (81 mg total) by mouth daily. Swallow whole.     bisacodyl  (DULCOLAX) 5 MG EC tablet Take 1 tablet (5 mg total) by mouth daily as needed for severe constipation.     carvedilol  (COREG ) 3.125 MG tablet Take 3.125 mg by mouth 2 (two) times daily with a meal.     clopidogrel  (PLAVIX ) 75 MG tablet Take 1 tablet (75 mg total) by mouth daily.     colchicine  0.6 MG tablet Take 0.6 mg by mouth daily.     Ferrous Sulfate  (IRON) 325 (65 Fe) MG TABS Take 1 tablet by mouth every  other day.     oxyCODONE  (OXY IR/ROXICODONE ) 5 MG immediate release tablet Take 1 tablet (5 mg total) by mouth every 6 (six) hours as needed for severe pain (pain score 7-10). 10 tablet 0   polyethylene glycol (MIRALAX  / GLYCOLAX ) 17 g packet Take 17 g by mouth daily.     Current Facility-Administered Medications on File Prior to Visit  Medication Dose Route Frequency Provider Last Rate Last Admin   heparin  lock flush 100 unit/mL  500 Units Intravenous Once Corcoran, Melissa C, MD       sodium chloride  0.9 % injection 10 mL  10 mL Intravenous PRN Gittin, Robert G, MD   10 mL at 08/26/14 1300   sodium chloride  flush (NS) 0.9 % injection 10 mL  10 mL Intravenous PRN Corcoran, Melissa C, MD        There are no Patient Instructions on file for this visit. No follow-ups on file.   Presli Fanguy E Meghana Tullo, NP

## 2023-08-12 ENCOUNTER — Encounter: Admitting: Family

## 2023-08-23 ENCOUNTER — Telehealth: Payer: Self-pay | Admitting: Family

## 2023-08-23 NOTE — Telephone Encounter (Signed)
 Pt wife said he just got out of hospital and is in rehab will call back to schedule once he's done with physical therapy

## 2023-08-25 ENCOUNTER — Encounter: Payer: Self-pay | Admitting: Physician Assistant

## 2023-08-25 ENCOUNTER — Ambulatory Visit (INDEPENDENT_AMBULATORY_CARE_PROVIDER_SITE_OTHER): Admitting: Physician Assistant

## 2023-08-25 VITALS — BP 139/89 | HR 80 | Ht 69.0 in | Wt 166.0 lb

## 2023-08-25 DIAGNOSIS — N35912 Unspecified bulbous urethral stricture, male: Secondary | ICD-10-CM | POA: Diagnosis not present

## 2023-08-25 DIAGNOSIS — R310 Gross hematuria: Secondary | ICD-10-CM

## 2023-08-25 DIAGNOSIS — R8281 Pyuria: Secondary | ICD-10-CM | POA: Diagnosis not present

## 2023-08-25 LAB — URINALYSIS, COMPLETE
Bilirubin, UA: NEGATIVE
Glucose, UA: NEGATIVE
Ketones, UA: NEGATIVE
Nitrite, UA: NEGATIVE
Specific Gravity, UA: 1.02 (ref 1.005–1.030)
Urobilinogen, Ur: 0.2 mg/dL (ref 0.2–1.0)
pH, UA: 6 (ref 5.0–7.5)

## 2023-08-25 LAB — MICROSCOPIC EXAMINATION: RBC, Urine: >30 /HPF — AB (ref 0–2)

## 2023-08-25 LAB — BLADDER SCAN AMB NON-IMAGING

## 2023-08-25 MED ORDER — SULFAMETHOXAZOLE-TRIMETHOPRIM 800-160 MG PO TABS: 1.0000 | ORAL_TABLET | Freq: Two times a day (BID) | ORAL | 0 refills | Status: AC

## 2023-08-25 NOTE — Progress Notes (Signed)
 08/25/2023 10:26 AM   [name] [name] [name] 1955/07/13 [id number]  CC: Chief Complaint  Patient presents with   Follow-up   HPI: [name] [name] [name] is a 68 y.o. male with PMH bulbar urethral stricture requiring multiple dilations who presents today for follow-up after successful voiding trial with me last month. He is accompanied today by his wife, who contributes to HPI.  He most recently underwent urethral dilation with difficult Foley placement with Dr. Ace Holder intraoperatively at the time of a vascular procedure with Drs. Dew and Teaching laboratory technician. He is now on aspirin  and Plavix .  Today he reports intermittent gross hematuria since his recent vascular procedure.  He also has intermittent mild dysuria.  They clarify today that he was a longtime patient of Dr. Lauretha Pontiff, and underwent multiple dilations with him.  He used to CIC to keep his urethra patent, however he stopped this quite sometime ago.  He denies fever, chills, nausea, or vomiting.  IPSS 10/mostly dissatisfied as below.  In-office UA today positive for red color, cloudy urine, 1+ protein, 3+ blood, and 1+ leukocytes; urine microscopy with 11-30 WBCs/HPF, >30 RBCs/HPF, and moderate bacteria. PVR 46mL, previously 25 mL.   IPSS     Row Name 08/25/23 1000         International Prostate Symptom Score   How often have you had the sensation of not emptying your bladder? Less than 1 in 5     How often have you had to urinate less than every two hours? Less than 1 in 5 times     How often have you found you stopped and started again several times when you urinated? Less than 1 in 5 times     How often have you found it difficult to postpone urination? Not at All     How often have you had a weak urinary stream? About half the time     How often have you had to strain to start urination? Less than half the time     How many times did you typically get up at night to urinate? 2 Times     Total IPSS Score 10       Quality of  Life due to urinary symptoms   If you were to spend the rest of your life with your urinary condition just the way it is now how would you feel about that? Mostly Disatisfied               PMH: Past Medical History:  Diagnosis Date   Atherosclerosis    Cancer (HCC)    CHF (congestive heart failure) (HCC)    Chronic kidney disease    kidney stones, UTI   Colon cancer (HCC)    Coronary artery disease    Diabetes mellitus without complication (HCC)    GERD (gastroesophageal reflux disease)    Gout    Hematuria    Hyperlipidemia    Hypertension    Iron deficiency anemia    Myocardial infarction [facility]) 2005   Peripheral vascular disease (HCC)    Pulmonary nodules    Rectal cancer (HCC)    Ureter, stricture    Wears dentures    full upper    Surgical History: Past Surgical History:  Procedure Laterality Date   BOWEL RESECTION N/A 10/23/2014   Procedure: LOW ANTERIOR BOWEL RESECTION;  Surgeon: Lauretta Ponto, MD;  Location: [facility];  Service: General;  Laterality: N/A;   COLON SURGERY     COLONOSCOPY 06/06/14  COLONOSCOPY WITH ESOPHAGOGASTRODUODENOSCOPY (EGD)     CORONARY ANGIOPLASTY WITH STENT PLACEMENT  03/2004   CYSTOSCOPY WITH STENT PLACEMENT Bilateral 10/23/2014   Procedure: CYSTOSCOPY WITH STENT PLACEMENT,URETHRAL DILATION, LEFT RETROGRADE PYELOGRAM, URETEROSCOPY;  Surgeon: Dustin Gimenez, MD;  Location: [facility];  Service: Urology;  Laterality: Bilateral;   DIVERTING ILEOSTOMY N/A 10/23/2014   Procedure: DIVERTING ILEOSTOMY;  Surgeon: Lauretta Ponto, MD;  Location: [facility];  Service: General;  Laterality: N/A;   FEMORAL-TIBIAL BYPASS GRAFT Left 07/11/2023   Procedure: CREATION, BYPASS, ARTERIAL, FEMORAL TO TIBIAL, USING GRAFT, INSITU;  Surgeon: Celso College, MD;  Location: [facility];  Service: Vascular;  Laterality: Left;   ILEOSTOMY CLOSURE N/A 09/08/2015   Procedure: ILEOSTOMY TAKEDOWN;  Surgeon: Gwyndolyn Lerner, MD;  Location: [facility];  Service: General;  Laterality: N/A;    LAPAROTOMY N/A 10/23/2014   Procedure: EXPLORATORY LAPAROTOMY;  Surgeon: Lauretta Ponto, MD;  Location: [facility];  Service: General;  Laterality: N/A;   LOWER EXTREMITY ANGIOGRAPHY Left 07/06/2023   Procedure: Lower Extremity Angiography;  Surgeon: Celso College, MD;  Location: [facility];  Service: Cardiovascular;  Laterality: Left;   PERIPHERAL VASCULAR CATHETERIZATION N/A 05/12/2015   Procedure: Abdominal Aortogram w/Lower Extremity;  Surgeon: Celso College, MD;  Location: [facility];  Service: Cardiovascular;  Laterality: N/A;   PERIPHERAL VASCULAR CATHETERIZATION  05/12/2015   Procedure: Lower Extremity Intervention;  Surgeon: Celso College, MD;  Location: [facility];  Service: Cardiovascular;;   PERIPHERAL VASCULAR CATHETERIZATION N/A 06/30/2015   Procedure: Abdominal Aortogram w/Lower Extremity;  Surgeon: Celso College, MD;  Location: [facility];  Service: Cardiovascular;  Laterality: N/A;   PERIPHERAL VASCULAR CATHETERIZATION  06/30/2015   Procedure: Lower Extremity Intervention;  Surgeon: Celso College, MD;  Location: [facility];  Service: Cardiovascular;;   PORT-A-CATH REMOVAL Right 09/08/2015   Procedure: REMOVAL PORT-A-CATH;  Surgeon: Gwyndolyn Lerner, MD;  Location: [facility];  Service: General;  Laterality: Right;   PORTACATH PLACEMENT  07/01/2014   Dr. Adele Admire   TRANSMETATARSAL AMPUTATION Left 07/15/2023   Procedure: AMPUTATION, FOOT, TRANSMETATARSAL;  Surgeon: Anell Baptist, DPM;  Location: [facility];  Service: Orthopedics/Podiatry;  Laterality: Left;    Home Medications:  Allergies as of 08/25/2023       Reactions   Entresto  [sacubitril -valsartan ] Cough   Lisinopril  Cough        Medication List        Accurate as of Aug 25, 2023 10:26 AM. If you have any questions, ask your nurse or doctor.          acetaminophen  500 MG tablet Commonly known as: TYLENOL  Take 500 mg by mouth every 6 (six) hours as needed.   aspirin  EC 81 MG tablet Take 1  tablet (81 mg total) by mouth daily. Swallow whole.   carvedilol  3.125 MG tablet Commonly known as: COREG  Take 3.125 mg by mouth 2 (two) times daily with a meal.   clopidogrel  75 MG tablet Commonly known as: PLAVIX  Take 1 tablet (75 mg total) by mouth daily.   colchicine  0.6 MG tablet Take 0.6 mg by mouth daily.   Dulcolax 5 MG EC tablet Generic drug: bisacodyl  Take 1 tablet (5 mg total) by mouth daily as needed for severe constipation.   Iron 325 (65 Fe) MG Tabs Take 1 tablet by mouth every other day.   oxyCODONE  5 MG immediate release tablet Commonly known as: Oxy IR/ROXICODONE  Take 1 tablet (5 mg total) by mouth every 6 (  six) hours as needed for severe pain (pain score 7-10).   polyethylene glycol 17 g packet Commonly known as: MIRALAX  / GLYCOLAX  Take 17 g by mouth daily.   vitamin C  1000 MG tablet Take 1,000 mg by mouth daily. Reported on 09/26/2015        Allergies:  Allergies  Allergen Reactions   Entresto  [Sacubitril -Valsartan ] Cough   Lisinopril  Cough    Family History: Family History  Problem Relation Age of Onset   Hypertension Brother    Hypertension Father    Diabetes Father    Diabetes Brother    Hypertension Brother    Heart disease Paternal Grandfather     Social History:   reports that he quit smoking about 38 years ago. His smoking use included cigarettes. He has never used smokeless tobacco. He reports that he does not drink alcohol and does not use drugs.  Physical Exam: BP 139/89   Pulse 80   Ht 5\' 9"  (1.753 m)   Wt 166 lb (75.3 kg)   BMI 24.51 kg/m   Constitutional:  Alert and oriented, no acute distress, nontoxic appearing HEENT: [place], AT Cardiovascular: No clubbing, cyanosis, or edema Respiratory: Normal respiratory effort, no increased work of breathing Skin: No rashes, bruises or suspicious lesions Neurologic: Grossly intact, no focal deficits, moving all 4 extremities Psychiatric: Normal mood and affect  Laboratory  Data: Results for orders placed or performed in visit on 08/25/23  Microscopic Examination   Collection Time: 08/25/23 10:16 AM   Urine  Result Value Ref Range   WBC, UA 11-30 (A) 0 - 5 /hpf   RBC, Urine >30 (A) 0 - 2 /hpf   Epithelial Cells (non renal) 0-10 0 - 10 /hpf   Mucus, UA Present (A) Not Estab.   Bacteria, UA Moderate (A) None seen/Few  Urinalysis, Complete   Collection Time: 08/25/23 10:16 AM  Result Value Ref Range   Specific Gravity, UA 1.020 1.005 - 1.030   pH, UA 6.0 5.0 - 7.5   Color, UA Red (A) Yellow   Appearance Ur Slightly cloudy Clear   Leukocytes,UA 1+ (A) Negative   Protein,UA 1+ (A) Negative/Trace   Glucose, UA Negative Negative   Ketones, UA Negative Negative   RBC, UA 3+ (A) Negative   Bilirubin, UA Negative Negative   Urobilinogen, Ur 0.2 0.2 - 1.0 mg/dL   Nitrite, UA Negative Negative   Microscopic Examination Comment    Microscopic Examination See below:   BLADDER SCAN AMB NON-IMAGING   Collection Time: 08/25/23 10:23 AM  Result Value Ref Range   Scan Result 46ml    Assessment & Plan:   1. Stricture of bulbous urethra in male, unspecified stricture type (Primary) Emptying appropriately.  We discussed that if his residuals start to rise or if he has recurrent obstructive voiding symptoms, we may need to consider Optilume dilation, which would reduce his risk for recurrence considerably. - BLADDER SCAN AMB NON-IMAGING  2. Pyuria UA appears positive today, will start empiric Bactrim  and send for culture for further evaluation.  See below. - Urinalysis, Complete - CULTURE, URINE COMPREHENSIVE - sulfamethoxazole -trimethoprim  (BACTRIM  DS) 800-160 MG tablet; Take 1 tablet by mouth 2 (two) times daily for 7 days.  Dispense: 14 tablet; Refill: 0  3. Gross hematuria Intermittent gross hematuria on DAPT in the setting of positive UA as above.  Will treat for possible UTI and plan to repeat UA in 2 weeks.  If his hematuria is persistent at that time, I  recommend a  hematuria workup.  He is in agreement.  Return in about 2 weeks (around 09/08/2023) for Lab visit for UA.  Kathreen Pare, PA-C  [facility] [place] [address] [place], [place] [address] [phone]

## 2023-08-30 LAB — CULTURE, URINE COMPREHENSIVE

## 2023-09-02 LAB — COMPREHENSIVE METABOLIC PANEL WITH GFR: EGFR: 57

## 2023-09-06 ENCOUNTER — Encounter (INDEPENDENT_AMBULATORY_CARE_PROVIDER_SITE_OTHER): Payer: Self-pay

## 2023-09-07 NOTE — Progress Notes (Signed)
 Advanced Heart Failure Clinic Note    PCP: Evelena Hines, FNP (last seen last week) Primary Cardiologist: Burney Carter, MD (last seen 01/25)  Chief Complaint: fatigue   HPI:  [name] [name] is a 68 y/o male with a history of rectal cancer, CAD/ STEMI 68 2005, DM, HTN, CKD, GERD, gout, anemia, PVD, pulmonary nodules, osteomyelitis left foot (03/25), hyperlipidemia, anemia, urethral stricture, previous tobacco use and chronic heart failure.   LHC done 2016. Cath/ PCI done 2005.  Admitted 05/05/22 due to SOB, weight gain and pedal edema due to a/c heart failure. Diuresed. Echo 05/08/22: EF of <20% along with moderate pleural effusion in the left lateral region and moderate [name]/TR.   Admitted 07/04/23 with black toes of left foot. IV antibiotics given and found to have osteomyelitis and gangreen of 2nd, 3rd, 4th toes. Echo 07/05/23: EF 20% with G1DD, normal RV, mild LAE, mild [name]. Angiogram done 07/06/23 and then had LLE bypass 07/11/23. Had left foot TMA 07/15/23. Transfused 1 unit PRBC's postop after hemoglobin dropped.    He presents today, with his wife, for a HF follow-up visit with a chief complaint of fatigue. He has associated left lower leg edema and still wears a boot on his left foot and has just recently started to do some weight-bearing.  Denies shortness of breath, chest pain, palpitations, abdominal distention, dizziness or difficulty sleeping. GDMT meds had been reduced due to hypotension and kidney function.   ROS: All systems negative except as listed in HPI, PMH and Problem List.  SH:  Social History   Socioeconomic History   Marital status: Married    Spouse name: Not on file   Number of children: Not on file   Years of education: Not on file   Highest education level: Not on file  Occupational History   Not on file  Tobacco Use   Smoking status: Former    Current packs/day: 0.00    Types: Cigarettes    Quit date: 11/19/1984    Years since quitting: 38.8   Smokeless  tobacco: Never   Tobacco comments:    smoked for brief period in his teens  Vaping Use   Vaping status: Never Used  Substance and Sexual Activity   Alcohol use: No    Alcohol/week: 0.0 standard drinks of alcohol   Drug use: No   Sexual activity: Not Currently  Other Topics Concern   Not on file  Social History Narrative   Not on file   Social Drivers of Health   Financial Resource Strain: Not on file  Food Insecurity: No Food Insecurity (07/04/2023)   Hunger Vital Sign    Worried About Running Out of Food in the Last Year: Never true    Ran Out of Food in the Last Year: Never true  Transportation Needs: No Transportation Needs (07/04/2023)   PRAPARE - Administrator, Civil Service (Medical): No    Lack of Transportation (Non-Medical): No  Physical Activity: Not on file  Stress: Not on file  Social Connections: Moderately Integrated (07/04/2023)   Social Connection and Isolation Panel [NHANES]    Frequency of Communication with Friends and Family: Three times a week    Frequency of Social Gatherings with Friends and Family: More than three times a week    Attends Religious Services: More than 4 times per year    Active Member of [employer] or Organizations: No    Attends Banker Meetings: Never    Marital Status: Married  Intimate Partner Violence: Not At Risk (07/04/2023)   Humiliation, Afraid, Rape, and Kick questionnaire    Fear of Current or Ex-Partner: No    Emotionally Abused: No    Physically Abused: No    Sexually Abused: No    FH:  Family History  Problem Relation Age of Onset   Hypertension Brother    Hypertension Father    Diabetes Father    Diabetes Brother    Hypertension Brother    Heart disease Paternal Grandfather     Past Medical History:  Diagnosis Date   Atherosclerosis    Cancer (HCC)    CHF (congestive heart failure) (HCC)    Chronic kidney disease    kidney stones, UTI   Colon cancer (HCC)    Coronary artery disease     Diabetes mellitus without complication (HCC)    GERD (gastroesophageal reflux disease)    Gout    Hematuria    Hyperlipidemia    Hypertension    Iron deficiency anemia    Myocardial infarction [facility]) 2005   Peripheral vascular disease (HCC)    Pulmonary nodules    Rectal cancer (HCC)    Ureter, stricture    Wears dentures    full upper    Current Outpatient Medications  Medication Sig Dispense Refill   acetaminophen  (TYLENOL ) 500 MG tablet Take 500 mg by mouth every 6 (six) hours as needed.     Ascorbic Acid  (VITAMIN C ) 1000 MG tablet Take 1,000 mg by mouth daily. Reported on 09/26/2015     aspirin  EC 81 MG tablet Take 1 tablet (81 mg total) by mouth daily. Swallow whole.     bisacodyl  (DULCOLAX) 5 MG EC tablet Take 1 tablet (5 mg total) by mouth daily as needed for severe constipation.     carvedilol  (COREG ) 3.125 MG tablet Take 3.125 mg by mouth 2 (two) times daily with a meal.     clopidogrel  (PLAVIX ) 75 MG tablet Take 1 tablet (75 mg total) by mouth daily.     colchicine  0.6 MG tablet Take 0.6 mg by mouth daily.     Ferrous Sulfate  (IRON) 325 (65 Fe) MG TABS Take 1 tablet by mouth every other day.     oxyCODONE  (OXY IR/ROXICODONE ) 5 MG immediate release tablet Take 1 tablet (5 mg total) by mouth every 6 (six) hours as needed for severe pain (pain score 7-10). 10 tablet 0   polyethylene glycol (MIRALAX  / GLYCOLAX ) 17 g packet Take 17 g by mouth daily.     No current facility-administered medications for this visit.   Facility-Administered Medications Ordered in Other Visits  Medication Dose Route Frequency Provider Last Rate Last Admin   heparin  lock flush 100 unit/mL  500 Units Intravenous Once Corcoran, Melissa C, MD       sodium chloride  0.9 % injection 10 mL  10 mL Intravenous PRN Gittin, Robert G, MD   10 mL at 08/26/14 1300   sodium chloride  flush (NS) 0.9 % injection 10 mL  10 mL Intravenous PRN Corcoran, Melissa C, MD       Vitals:   09/08/23 1504  BP: 125/79   Pulse: 61  SpO2: 98%  Weight: 162 lb (73.5 kg)   Wt Readings from Last 3 Encounters:  09/08/23 162 lb (73.5 kg)  08/25/23 166 lb (75.3 kg)  08/02/23 166 lb (75.3 kg)   Lab Results  Component Value Date   CREATININE 1.21 07/22/2023   CREATININE 1.30 (H) 07/21/2023   CREATININE 1.48 (H)  07/20/2023   PHYSICAL EXAM:  General: Well appearing. No resp difficulty HEENT: normal Neck: supple, no JVD Cor: Regular rhythm, rate. No rubs, gallops or murmurs Lungs: clear Abdomen: soft, nontender, nondistended. Extremities: no cyanosis, clubbing, rash, 1+ pitting edema left lower leg Neuro: alert & oriented X 3. Moves all 4 extremities w/o difficulty. Wearing boot on left foot. Affect pleasant   ECG: not done   ASSESSMENT & PLAN:  1: Ischemic heart failure with reduced ejection fraction- - NYHA class II - euvolemic today  - weight stable pounds from last visit here 10 months ago - echo 05/08/22: EF of <20% along with moderate pleural effusion in the left lateral region and moderate [name]/TR.  - Echo 07/05/23: EF 20% with G1DD, normal RV, mild LAE, mild [name] - continue carvedilol  3.125mg  BID - continue 20mg  lasix  PRN - MRA has been stopped due to hypotension - has urethral stricture so may not tolerate SGLT2 - reports having a cough with both entresto  and lisinopril  - BNP 05/05/22 was 3983.4  2: HTN with CKD- - BP 125/79 - saw PCP @ [facility] last week and had lab work done; will call and ask for results to be sent to us  - BMP 07/22/23 reviewed: sodium 142, potassium 3.4, creatinine 1.21 & GFR >60 - saw nephrology (Kolluru) 10/24  3: DM- - A1c 05/06/22 was 5.5%  4: CAD- - STEMI 2005 - cath done (PCI/ stent) 2005 & cath done 2016 - saw cardiology Beau Bound) 01/25  5: Urethral stricture- - saw urology 05/25 - no longer has foley catheter  6: PVD- - Angiogram done 07/06/23 and then had LLE bypass 07/11/23.  - Had left foot TMA 07/15/23. - saw vascular Bevin Bucks) 04/25 - saw  podiatry Althea Atkinson) 04/25 & currently wearing boot on left foot   Due to HF stability and numerous other providers, will not make a return appointment at this time. Advised patient and wife to follow closely with cardiology but that they could return at anytime for questions or to make another appointment and they were comfortable with this plan.   Charlette Console, FNP 09/07/23

## 2023-09-08 ENCOUNTER — Ambulatory Visit: Attending: Family | Admitting: Family

## 2023-09-08 ENCOUNTER — Encounter: Payer: Self-pay | Admitting: Family

## 2023-09-08 ENCOUNTER — Other Ambulatory Visit

## 2023-09-08 VITALS — BP 125/79 | HR 61 | Wt 162.0 lb

## 2023-09-08 DIAGNOSIS — I130 Hypertensive heart and chronic kidney disease with heart failure and stage 1 through stage 4 chronic kidney disease, or unspecified chronic kidney disease: Secondary | ICD-10-CM | POA: Diagnosis present

## 2023-09-08 DIAGNOSIS — I252 Old myocardial infarction: Secondary | ICD-10-CM | POA: Insufficient documentation

## 2023-09-08 DIAGNOSIS — Z79899 Other long term (current) drug therapy: Secondary | ICD-10-CM | POA: Diagnosis not present

## 2023-09-08 DIAGNOSIS — N35912 Unspecified bulbous urethral stricture, male: Secondary | ICD-10-CM

## 2023-09-08 DIAGNOSIS — E785 Hyperlipidemia, unspecified: Secondary | ICD-10-CM | POA: Insufficient documentation

## 2023-09-08 DIAGNOSIS — E119 Type 2 diabetes mellitus without complications: Secondary | ICD-10-CM

## 2023-09-08 DIAGNOSIS — I739 Peripheral vascular disease, unspecified: Secondary | ICD-10-CM

## 2023-09-08 DIAGNOSIS — I5022 Chronic systolic (congestive) heart failure: Secondary | ICD-10-CM

## 2023-09-08 DIAGNOSIS — Z8249 Family history of ischemic heart disease and other diseases of the circulatory system: Secondary | ICD-10-CM | POA: Diagnosis not present

## 2023-09-08 DIAGNOSIS — N189 Chronic kidney disease, unspecified: Secondary | ICD-10-CM | POA: Insufficient documentation

## 2023-09-08 DIAGNOSIS — E1122 Type 2 diabetes mellitus with diabetic chronic kidney disease: Secondary | ICD-10-CM | POA: Diagnosis not present

## 2023-09-08 DIAGNOSIS — I5020 Unspecified systolic (congestive) heart failure: Secondary | ICD-10-CM | POA: Insufficient documentation

## 2023-09-08 DIAGNOSIS — N35919 Unspecified urethral stricture, male, unspecified site: Secondary | ICD-10-CM | POA: Insufficient documentation

## 2023-09-08 DIAGNOSIS — I2510 Atherosclerotic heart disease of native coronary artery without angina pectoris: Secondary | ICD-10-CM | POA: Insufficient documentation

## 2023-09-08 DIAGNOSIS — I10 Essential (primary) hypertension: Secondary | ICD-10-CM

## 2023-09-08 DIAGNOSIS — Z833 Family history of diabetes mellitus: Secondary | ICD-10-CM | POA: Diagnosis not present

## 2023-09-08 DIAGNOSIS — E1151 Type 2 diabetes mellitus with diabetic peripheral angiopathy without gangrene: Secondary | ICD-10-CM | POA: Insufficient documentation

## 2023-09-08 DIAGNOSIS — R8281 Pyuria: Secondary | ICD-10-CM

## 2023-09-08 LAB — MICROSCOPIC EXAMINATION: WBC, UA: >30 /HPF — AB (ref 0–5)

## 2023-09-08 LAB — URINALYSIS, COMPLETE
Bilirubin, UA: NEGATIVE
Glucose, UA: NEGATIVE
Ketones, UA: NEGATIVE
Nitrite, UA: NEGATIVE
Protein,UA: NEGATIVE
Specific Gravity, UA: 1.01 (ref 1.005–1.030)
Urobilinogen, Ur: 0.2 mg/dL (ref 0.2–1.0)
pH, UA: 6 (ref 5.0–7.5)

## 2023-09-08 NOTE — Patient Instructions (Signed)
 If you receive a satisfaction survey regarding the Heart Failure Clinic, please take the time to fill it out. This way we can continue to provide excellent care and make any changes that need to be made.   Call us in the future if you need [place] for anything.

## 2023-09-13 ENCOUNTER — Ambulatory Visit: Payer: Self-pay | Admitting: Physician Assistant

## 2023-09-13 DIAGNOSIS — R310 Gross hematuria: Secondary | ICD-10-CM

## 2023-09-13 NOTE — Progress Notes (Signed)
 He has persistent microscopic hematuria despite recent antibiotics. I recommend a hematuria workup per my recent note. I'm putting in orders for a CT urogram; the imaging department will call him to schedule this. Please schedule him for cysto and CT results with Dr. Ace Holder in 3-4 weeks.

## 2023-09-19 ENCOUNTER — Ambulatory Visit (INDEPENDENT_AMBULATORY_CARE_PROVIDER_SITE_OTHER): Admitting: Nurse Practitioner

## 2023-09-19 ENCOUNTER — Encounter (INDEPENDENT_AMBULATORY_CARE_PROVIDER_SITE_OTHER): Payer: Self-pay | Admitting: Nurse Practitioner

## 2023-09-19 VITALS — BP 140/80 | HR 57 | Resp 16

## 2023-09-19 DIAGNOSIS — I70262 Atherosclerosis of native arteries of extremities with gangrene, left leg: Secondary | ICD-10-CM

## 2023-09-19 NOTE — Progress Notes (Signed)
 Subjective:    Patient ID: [name] Hamel [name], male    DOB: [birth year], 68 y.o.   MRN: [id number] Chief Complaint  Patient presents with   Wound Check    6 week follow up    [name] Hamel [name] is a 68 y.o. male.  He returns today following a left femoral to tibial bypass and left profunda femoris endarterectomy on 07/11/2023.  He previously had significant swelling from reperfusion of the left lower extremity.  Today all of his incisions are clean dry and intact and healing well.  There is no open wounds or ulcerations.  He previously had a large hematoma in his thigh but this area has decreased significantly and softened as well.  He notes that his leg feels much better.  His leg is warm today but his wife notes that it is not as warm as it previously was, however I suspect this is due to reperfusion changes.  He notes that his TMA site is healing well and this correlates with recent office visit notes by Dr. Althea Atkinson.     Review of Systems  Neurological:  Positive for weakness.  All other systems reviewed and are negative.      Objective:   Physical Exam Vitals reviewed.  HENT:     Head: Normocephalic.  Cardiovascular:     Rate and Rhythm: Normal rate.     Pulses:          Dorsalis pedis pulses are detected w/ Doppler on the left side.       Posterior tibial pulses are detected w/ Doppler on the left side.  Pulmonary:     Effort: Pulmonary effort is normal.  Skin:    General: Skin is warm and dry.  Neurological:     Mental Status: He is alert and oriented to person, place, and time.  Psychiatric:        Mood and Affect: Mood normal.        Behavior: Behavior normal.        Thought Content: Thought content normal.        Judgment: Judgment normal.     BP (!) 140/80   Pulse (!) 57   Resp 16   Past Medical History:  Diagnosis Date   Atherosclerosis    Cancer (HCC)    CHF (congestive heart failure) (HCC)    Chronic kidney disease    kidney stones, UTI   Colon  cancer (HCC)    Coronary artery disease    Diabetes mellitus without complication (HCC)    GERD (gastroesophageal reflux disease)    Gout    Hematuria    Hyperlipidemia    Hypertension    Iron deficiency anemia    Myocardial infarction [facility]) 2005   Peripheral vascular disease (HCC)    Pulmonary nodules    Rectal cancer (HCC)    Ureter, stricture    Wears dentures    full upper    Social History   Socioeconomic History   Marital status: Married    Spouse name: Not on file   Number of children: Not on file   Years of education: Not on file   Highest education level: Not on file  Occupational History   Not on file  Tobacco Use   Smoking status: Former    Current packs/day: 0.00    Types: Cigarettes    Quit date: 11/19/1984    Years since quitting: 38.8   Smokeless tobacco: Never   Tobacco comments:  smoked for brief period in his teens  Vaping Use   Vaping status: Never Used  Substance and Sexual Activity   Alcohol use: No    Alcohol/week: 0.0 standard drinks of alcohol   Drug use: No   Sexual activity: Not Currently  Other Topics Concern   Not on file  Social History Narrative   Not on file   Social Drivers of Health   Financial Resource Strain: Not on file  Food Insecurity: No Food Insecurity (07/04/2023)   Hunger Vital Sign    Worried About Running Out of Food in the Last Year: Never true    Ran Out of Food in the Last Year: Never true  Transportation Needs: No Transportation Needs (07/04/2023)   PRAPARE - Administrator, Civil Service (Medical): No    Lack of Transportation (Non-Medical): No  Physical Activity: Not on file  Stress: Not on file  Social Connections: Moderately Integrated (07/04/2023)   Social Connection and Isolation Panel [NHANES]    Frequency of Communication with Friends and Family: Three times a week    Frequency of Social Gatherings with Friends and Family: More than three times a week    Attends Religious Services: More  than 4 times per year    Active Member of Clubs or Organizations: No    Attends Banker Meetings: Never    Marital Status: Married  Catering manager Violence: Not At Risk (07/04/2023)   Humiliation, Afraid, Rape, and Kick questionnaire    Fear of Current or Ex-Partner: No    Emotionally Abused: No    Physically Abused: No    Sexually Abused: No    Past Surgical History:  Procedure Laterality Date   BOWEL RESECTION N/A 10/23/2014   Procedure: LOW ANTERIOR BOWEL RESECTION;  Surgeon: Lauretta Ponto, MD;  Location: [facility];  Service: General;  Laterality: N/A;   COLON SURGERY     COLONOSCOPY 06/06/14     COLONOSCOPY WITH ESOPHAGOGASTRODUODENOSCOPY (EGD)     CORONARY ANGIOPLASTY WITH STENT PLACEMENT  03/2004   CYSTOSCOPY WITH STENT PLACEMENT Bilateral 10/23/2014   Procedure: CYSTOSCOPY WITH STENT PLACEMENT,URETHRAL DILATION, LEFT RETROGRADE PYELOGRAM, URETEROSCOPY;  Surgeon: Dustin Gimenez, MD;  Location: [facility];  Service: Urology;  Laterality: Bilateral;   DIVERTING ILEOSTOMY N/A 10/23/2014   Procedure: DIVERTING ILEOSTOMY;  Surgeon: Lauretta Ponto, MD;  Location: [facility];  Service: General;  Laterality: N/A;   FEMORAL-TIBIAL BYPASS GRAFT Left 07/11/2023   Procedure: CREATION, BYPASS, ARTERIAL, FEMORAL TO TIBIAL, USING GRAFT, INSITU;  Surgeon: Celso College, MD;  Location: [facility];  Service: Vascular;  Laterality: Left;   ILEOSTOMY CLOSURE N/A 09/08/2015   Procedure: ILEOSTOMY TAKEDOWN;  Surgeon: Gwyndolyn Lerner, MD;  Location: [facility];  Service: General;  Laterality: N/A;   LAPAROTOMY N/A 10/23/2014   Procedure: EXPLORATORY LAPAROTOMY;  Surgeon: Lauretta Ponto, MD;  Location: [facility];  Service: General;  Laterality: N/A;   LOWER EXTREMITY ANGIOGRAPHY Left 07/06/2023   Procedure: Lower Extremity Angiography;  Surgeon: Celso College, MD;  Location: [facility];  Service: Cardiovascular;  Laterality: Left;   PERIPHERAL VASCULAR CATHETERIZATION N/A 05/12/2015   Procedure: Abdominal Aortogram  w/Lower Extremity;  Surgeon: Celso College, MD;  Location: [facility];  Service: Cardiovascular;  Laterality: N/A;   PERIPHERAL VASCULAR CATHETERIZATION  05/12/2015   Procedure: Lower Extremity Intervention;  Surgeon: Celso College, MD;  Location: [facility];  Service: Cardiovascular;;   PERIPHERAL VASCULAR CATHETERIZATION N/A 06/30/2015   Procedure: Abdominal  Aortogram w/Lower Extremity;  Surgeon: Celso College, MD;  Location: [facility];  Service: Cardiovascular;  Laterality: N/A;   PERIPHERAL VASCULAR CATHETERIZATION  06/30/2015   Procedure: Lower Extremity Intervention;  Surgeon: Celso College, MD;  Location: [facility];  Service: Cardiovascular;;   PORT-A-CATH REMOVAL Right 09/08/2015   Procedure: REMOVAL PORT-A-CATH;  Surgeon: Gwyndolyn Lerner, MD;  Location: [facility];  Service: General;  Laterality: Right;   PORTACATH PLACEMENT  07/01/2014   Dr. Adele Admire   TRANSMETATARSAL AMPUTATION Left 07/15/2023   Procedure: AMPUTATION, FOOT, TRANSMETATARSAL;  Surgeon: Anell Baptist, DPM;  Location: [facility];  Service: Orthopedics/Podiatry;  Laterality: Left;    Family History  Problem Relation Age of Onset   Hypertension Brother    Hypertension Father    Diabetes Father    Diabetes Brother    Hypertension Brother    Heart disease Paternal Grandfather     Allergies  Allergen Reactions   Entresto  [Sacubitril -Valsartan ] Cough   Lisinopril  Cough       Latest Ref Rng & Units 07/22/2023    5:26 AM 07/21/2023    4:56 AM 07/20/2023    1:54 AM  CBC  WBC 4.0 - 10.5 K/uL 9.4  9.7  9.7   Hemoglobin 13.0 - 17.0 g/dL 7.1  7.6  7.9   Hematocrit 39.0 - 52.0 % 22.5  24.0  24.3   Platelets 150 - 400 K/uL 248  301  277       CMP     Component Value Date/Time   NA 142 07/22/2023 0526   NA 132 (L) 08/12/2014 0858   K 3.4 (L) 07/22/2023 0526   K 4.0 08/12/2014 0858   CL 110 07/22/2023 0526   CL 100 (L) 08/12/2014 0858   CO2 22 07/22/2023 0526   CO2 24 08/12/2014 0858    GLUCOSE 71 07/22/2023 0526   GLUCOSE 118 (H) 08/12/2014 0858   BUN 27 (H) 07/22/2023 0526   BUN 30 (H) 08/12/2014 0858   CREATININE 1.21 07/22/2023 0526   CREATININE 1.52 (H) 08/12/2014 0858   CALCIUM  8.2 (L) 07/22/2023 0526   CALCIUM  8.9 08/12/2014 0858   PROT 7.3 07/04/2023 1318   PROT 7.1 08/12/2014 0858   ALBUMIN  3.7 07/04/2023 1318   ALBUMIN  3.7 08/12/2014 0858   AST 14 (L) 07/04/2023 1318   AST 16 08/12/2014 0858   ALT 15 07/04/2023 1318   ALT 15 (L) 08/12/2014 0858   ALKPHOS 97 07/04/2023 1318   ALKPHOS 69 08/12/2014 0858   BILITOT 0.8 07/04/2023 1318   BILITOT 0.6 08/12/2014 0858   GFRNONAA >60 07/22/2023 0526   GFRNONAA 49 (L) 08/12/2014 0858     No results found.     Assessment & Plan:   1. Atherosclerosis of native artery of left lower extremity with gangrene (HCC) (Primary) Today the patient's wounds are completely healed and he is doing well from a healing perspective.  He will continue to follow with podiatry for the TMA.  Will have him return in 6 weeks for noninvasive studies to evaluate his bypass.   Current Outpatient Medications on File Prior to Visit  Medication Sig Dispense Refill   acetaminophen  (TYLENOL ) 500 MG tablet Take 500 mg by mouth every 6 (six) hours as needed.     Ascorbic Acid  (VITAMIN C ) 1000 MG tablet Take 1,000 mg by mouth daily. Reported on 09/26/2015     aspirin  EC 81 MG tablet Take 1 tablet (81 mg total) by mouth daily. Swallow whole.  bisacodyl  (DULCOLAX) 5 MG EC tablet Take 1 tablet (5 mg total) by mouth daily as needed for severe constipation.     carvedilol  (COREG ) 3.125 MG tablet Take 3.125 mg by mouth 2 (two) times daily with a meal.     clopidogrel  (PLAVIX ) 75 MG tablet Take 1 tablet (75 mg total) by mouth daily.     colchicine  0.6 MG tablet Take 0.6 mg by mouth daily.     Ferrous Sulfate  (IRON) 325 (65 Fe) MG TABS Take 1 tablet by mouth every other day.     oxyCODONE  (OXY IR/ROXICODONE ) 5 MG immediate release tablet Take 1  tablet (5 mg total) by mouth every 6 (six) hours as needed for severe pain (pain score 7-10). 10 tablet 0   polyethylene glycol (MIRALAX  / GLYCOLAX ) 17 g packet Take 17 g by mouth daily.     Current Facility-Administered Medications on File Prior to Visit  Medication Dose Route Frequency Provider Last Rate Last Admin   heparin  lock flush 100 unit/mL  500 Units Intravenous Once Corcoran, Melissa C, MD       sodium chloride  0.9 % injection 10 mL  10 mL Intravenous PRN Gittin, Robert G, MD   10 mL at 08/26/14 1300   sodium chloride  flush (NS) 0.9 % injection 10 mL  10 mL Intravenous PRN Corcoran, Melissa C, MD        There are no Patient Instructions on file for this visit. No follow-ups on file.   Kerah Hardebeck [employer], NP

## 2023-10-05 SURGERY — CREATION, BYPASS, ARTERIAL, FEMORAL TO TIBIAL, USING GRAFT
Anesthesia: General | Laterality: Left

## 2023-10-11 ENCOUNTER — Ambulatory Visit
Admission: RE | Admit: 2023-10-11 | Discharge: 2023-10-11 | Disposition: A | Discharge status: Home / Self Care | Source: Ambulatory Visit | Attending: Physician Assistant | Admitting: Physician Assistant

## 2023-10-11 DIAGNOSIS — R310 Gross hematuria: Secondary | ICD-10-CM | POA: Insufficient documentation

## 2023-10-11 LAB — POCT I-STAT CREATININE: Creatinine, Ser: 1.2 mg/dL (ref 0.61–1.24)

## 2023-10-11 MED ORDER — IOHEXOL 300 MG/ML  SOLN
100.0000 mL | Freq: Once | INTRAMUSCULAR | Status: AC | PRN
  Administered 2023-10-11: 100 mL via INTRAVENOUS

## 2023-10-11 MED ORDER — SODIUM CHLORIDE 0.9 % IV SOLN: INTRAVENOUS | Status: DC

## 2023-10-14 ENCOUNTER — Other Ambulatory Visit: Admitting: Urology

## 2023-10-28 ENCOUNTER — Other Ambulatory Visit (INDEPENDENT_AMBULATORY_CARE_PROVIDER_SITE_OTHER): Payer: Self-pay | Admitting: Nurse Practitioner

## 2023-10-28 DIAGNOSIS — Z9889 Other specified postprocedural states: Secondary | ICD-10-CM

## 2023-11-02 ENCOUNTER — Ambulatory Visit (INDEPENDENT_AMBULATORY_CARE_PROVIDER_SITE_OTHER): Admitting: Nurse Practitioner

## 2023-11-02 ENCOUNTER — Encounter (INDEPENDENT_AMBULATORY_CARE_PROVIDER_SITE_OTHER): Payer: Self-pay

## 2023-11-02 ENCOUNTER — Other Ambulatory Visit (INDEPENDENT_AMBULATORY_CARE_PROVIDER_SITE_OTHER)

## 2023-11-02 ENCOUNTER — Encounter (INDEPENDENT_AMBULATORY_CARE_PROVIDER_SITE_OTHER): Payer: Self-pay | Admitting: Nurse Practitioner

## 2023-11-02 VITALS — BP 133/80 | HR 60 | Resp 18

## 2023-11-02 DIAGNOSIS — L97509 Non-pressure chronic ulcer of other part of unspecified foot with unspecified severity: Secondary | ICD-10-CM

## 2023-11-02 DIAGNOSIS — I10 Essential (primary) hypertension: Secondary | ICD-10-CM | POA: Diagnosis not present

## 2023-11-02 DIAGNOSIS — Z9889 Other specified postprocedural states: Secondary | ICD-10-CM

## 2023-11-02 DIAGNOSIS — I739 Peripheral vascular disease, unspecified: Secondary | ICD-10-CM

## 2023-11-02 DIAGNOSIS — E11621 Type 2 diabetes mellitus with foot ulcer: Secondary | ICD-10-CM

## 2023-11-02 NOTE — Progress Notes (Signed)
 Subjective:    Patient ID: [name] ONEIDA Kirks [name], male    DOB: 1955/12/30, 68 y.o.   MRN: [id number] Chief Complaint  Patient presents with   Follow-up    6 week abi and left le arterial     [name] MENDENHALL [name] is a 68 y.o. male.  He returns today following a left femoral to tibial bypass and left profunda femoris endarterectomy on 07/11/2023.  He previously had significant swelling from reperfusion of the left lower extremity.  Today all of his incisions are clean dry and intact and healing well.  There is no open wounds or ulcerations.  The swelling has resolved itself.  He denies any claudication symptoms.  He denies any significant rest pain.  His TMA has healed at this time.  Today it was noted that he has a seroma near his proximal anastomosis.  He also has a hematoma still at his distal anastomosis but it is much smaller at 1.73 x 1.50 cm.  In addition his left bypass graft shows an occlusion in the mid graft area.       Review of Systems  Neurological:  Positive for weakness.  All other systems reviewed and are negative.      Objective:   Physical Exam Vitals reviewed.  HENT:     Head: Normocephalic.  Cardiovascular:     Rate and Rhythm: Normal rate.     Pulses:          Dorsalis pedis pulses are detected w/ Doppler on the left side.       Posterior tibial pulses are detected w/ Doppler on the left side.  Pulmonary:     Effort: Pulmonary effort is normal.  Skin:    General: Skin is warm and dry.  Neurological:     Mental Status: He is alert and oriented to person, place, and time.  Psychiatric:        Mood and Affect: Mood normal.        Behavior: Behavior normal.        Thought Content: Thought content normal.        Judgment: Judgment normal.     BP 133/80   Pulse 60   Resp 18   Past Medical History:  Diagnosis Date   Atherosclerosis    Cancer (HCC)    CHF (congestive heart failure) (HCC)    Chronic kidney disease    kidney stones, UTI   Colon  cancer (HCC)    Coronary artery disease    Diabetes mellitus without complication (HCC)    GERD (gastroesophageal reflux disease)    Gout    Hematuria    Hyperlipidemia    Hypertension    Iron deficiency anemia    Myocardial infarction [facility]) 2005   Peripheral vascular disease (HCC)    Pulmonary nodules    Rectal cancer (HCC)    Ureter, stricture    Wears dentures    full upper    Social History   Socioeconomic History   Marital status: Married    Spouse name: Not on file   Number of children: Not on file   Years of education: Not on file   Highest education level: Not on file  Occupational History   Not on file  Tobacco Use   Smoking status: Former    Current packs/day: 0.00    Types: Cigarettes    Quit date: 11/19/1984    Years since quitting: 38.9   Smokeless tobacco: Never   Tobacco comments:  smoked for brief period in his teens  Vaping Use   Vaping status: Never Used  Substance and Sexual Activity   Alcohol use: No    Alcohol/week: 0.0 standard drinks of alcohol   Drug use: No   Sexual activity: Not Currently  Other Topics Concern   Not on file  Social History Narrative   Not on file   Social Drivers of Health   Financial Resource Strain: Not on file  Food Insecurity: No Food Insecurity (07/04/2023)   Hunger Vital Sign    Worried About Running Out of Food in the Last Year: Never true    Ran Out of Food in the Last Year: Never true  Transportation Needs: No Transportation Needs (07/04/2023)   PRAPARE - Administrator, Civil Service (Medical): No    Lack of Transportation (Non-Medical): No  Physical Activity: Not on file  Stress: Not on file  Social Connections: Moderately Integrated (07/04/2023)   Social Connection and Isolation Panel    Frequency of Communication with Friends and Family: Three times a week    Frequency of Social Gatherings with Friends and Family: More than three times a week    Attends Religious Services: More than 4  times per year    Active Member of Clubs or Organizations: No    Attends Banker Meetings: Never    Marital Status: Married  Catering manager Violence: Not At Risk (07/04/2023)   Humiliation, Afraid, Rape, and Kick questionnaire    Fear of Current or Ex-Partner: No    Emotionally Abused: No    Physically Abused: No    Sexually Abused: No    Past Surgical History:  Procedure Laterality Date   BOWEL RESECTION N/A 10/23/2014   Procedure: LOW ANTERIOR BOWEL RESECTION;  Surgeon: [name] [name];  Location: [facility];  Service: General;  Laterality: N/A;   COLON SURGERY     COLONOSCOPY 06/06/14     COLONOSCOPY WITH ESOPHAGOGASTRODUODENOSCOPY (EGD)     CORONARY ANGIOPLASTY WITH STENT PLACEMENT  03/2004   CYSTOSCOPY WITH STENT PLACEMENT Bilateral 10/23/2014   Procedure: CYSTOSCOPY WITH STENT PLACEMENT,URETHRAL DILATION, LEFT RETROGRADE PYELOGRAM, URETEROSCOPY;  Surgeon: [name] [name];  Location: [facility];  Service: Urology;  Laterality: Bilateral;   DIVERTING ILEOSTOMY N/A 10/23/2014   Procedure: DIVERTING ILEOSTOMY;  Surgeon: [name] [name];  Location: [facility];  Service: General;  Laterality: N/A;   FEMORAL-TIBIAL BYPASS GRAFT Left 07/11/2023   Procedure: CREATION, BYPASS, ARTERIAL, FEMORAL TO TIBIAL, USING GRAFT, INSITU;  Surgeon: [name] [name];  Location: [facility];  Service: Vascular;  Laterality: Left;   ILEOSTOMY CLOSURE N/A 09/08/2015   Procedure: ILEOSTOMY TAKEDOWN;  Surgeon: [name] [name];  Location: [facility];  Service: General;  Laterality: N/A;   LAPAROTOMY N/A 10/23/2014   Procedure: EXPLORATORY LAPAROTOMY;  Surgeon: [name] [name];  Location: [facility];  Service: General;  Laterality: N/A;   LOWER EXTREMITY ANGIOGRAPHY Left 07/06/2023   Procedure: Lower Extremity Angiography;  Surgeon: [name] [name];  Location: [facility];  Service: Cardiovascular;  Laterality: Left;   PERIPHERAL VASCULAR CATHETERIZATION N/A 05/12/2015   Procedure: Abdominal Aortogram w/Lower  Extremity;  Surgeon: Selinda RAMAN Marea, [name];  Location: [facility];  Service: Cardiovascular;  Laterality: N/A;   PERIPHERAL VASCULAR CATHETERIZATION  05/12/2015   Procedure: Lower Extremity Intervention;  Surgeon: Selinda RAMAN Marea, [name];  Location: [facility];  Service: Cardiovascular;;   PERIPHERAL VASCULAR CATHETERIZATION N/A 06/30/2015   Procedure: Abdominal Aortogram  w/Lower Extremity;  Surgeon: Selinda GORMAN Gu, [name];  Location: [facility];  Service: Cardiovascular;  Laterality: N/A;   PERIPHERAL VASCULAR CATHETERIZATION  06/30/2015   Procedure: Lower Extremity Intervention;  Surgeon: Selinda GORMAN Gu, [name];  Location: [facility];  Service: Cardiovascular;;   PORT-A-CATH REMOVAL Right 09/08/2015   Procedure: REMOVAL PORT-A-CATH;  Surgeon: [name] [name];  Location: [facility];  Service: General;  Laterality: Right;   PORTACATH PLACEMENT  07/01/2014   Dr. Eluterio   TRANSMETATARSAL AMPUTATION Left 07/15/2023   Procedure: AMPUTATION, FOOT, TRANSMETATARSAL;  Surgeon: Ashley Soulier, DPM;  Location: [facility];  Service: Orthopedics/Podiatry;  Laterality: Left;    Family History  Problem Relation Age of Onset   Hypertension Brother    Hypertension Father    Diabetes Father    Diabetes Brother    Hypertension Brother    Heart disease Paternal Grandfather     Allergies  Allergen Reactions   Entresto  [Sacubitril -Valsartan ] Cough   Lisinopril  Cough       Latest Ref Rng & Units 07/22/2023    5:26 AM 07/21/2023    4:56 AM 07/20/2023    1:54 AM  CBC  WBC 4.0 - 10.5 K/uL 9.4  9.7  9.7   Hemoglobin 13.0 - 17.0 g/dL 7.1  7.6  7.9   Hematocrit 39.0 - 52.0 % 22.5  24.0  24.3   Platelets 150 - 400 K/uL 248  301  277       CMP     Component Value Date/Time   NA 142 07/22/2023 0526   NA 132 (L) 08/12/2014 0858   K 3.4 (L) 07/22/2023 0526   K 4.0 08/12/2014 0858   CL 110 07/22/2023 0526   CL 100 (L) 08/12/2014 0858   CO2 22 07/22/2023 0526   CO2 24 08/12/2014 0858   GLUCOSE 71  07/22/2023 0526   GLUCOSE 118 (H) 08/12/2014 0858   BUN 27 (H) 07/22/2023 0526   BUN 30 (H) 08/12/2014 0858   CREATININE 1.20 10/11/2023 1310   CREATININE 1.52 (H) 08/12/2014 0858   CALCIUM  8.2 (L) 07/22/2023 0526   CALCIUM  8.9 08/12/2014 0858   PROT 7.3 07/04/2023 1318   PROT 7.1 08/12/2014 0858   ALBUMIN  3.7 07/04/2023 1318   ALBUMIN  3.7 08/12/2014 0858   AST 14 (L) 07/04/2023 1318   AST 16 08/12/2014 0858   ALT 15 07/04/2023 1318   ALT 15 (L) 08/12/2014 0858   ALKPHOS 97 07/04/2023 1318   ALKPHOS 69 08/12/2014 0858   BILITOT 0.8 07/04/2023 1318   BILITOT 0.6 08/12/2014 0858   EGFR 57.0 09/02/2023 0959   GFRNONAA >60 07/22/2023 0526   GFRNONAA 49 (L) 08/12/2014 0858     No results found.     Assessment & Plan:   1. Peripheral arterial disease with history of revascularization (HCC) (Primary) I had a discussion with the patient and his wife regarding his studies today and following this discussion and the lack of symptoms that Mr. Brock currently has, we will proceed with careful observation.  We discussed critical warning signs and symptoms such as foot discoloration or the development of rest pain or new wounds or ulcerations should prompt immediate attention.  We discussed angiogram as an option at this time but following shared patient decision making was decided to proceed conservatively.  Will plan to have the patient return in 3 months no studies.  2. Primary hypertension Continue antihypertensive medications as already ordered, these medications have been reviewed and there are no  changes at this time.  3. Type 2 diabetes mellitus with foot ulcer, without long-term current use of insulin  (HCC) Continue hypoglycemic medications as already ordered, these medications have been reviewed and there are no changes at this time.  Hgb A1C to be monitored as already arranged by primary service    Current Outpatient Medications on File Prior to Visit  Medication Sig  Dispense Refill   acetaminophen  (TYLENOL ) 500 MG tablet Take 500 mg by mouth every 6 (six) hours as needed.     Ascorbic Acid  (VITAMIN C ) 1000 MG tablet Take 1,000 mg by mouth daily. Reported on 09/26/2015     aspirin  EC 81 MG tablet Take 1 tablet (81 mg total) by mouth daily. Swallow whole.     bisacodyl  (DULCOLAX) 5 MG EC tablet Take 1 tablet (5 mg total) by mouth daily as needed for severe constipation.     carvedilol  (COREG ) 3.125 MG tablet Take 3.125 mg by mouth 2 (two) times daily with a meal.     clopidogrel  (PLAVIX ) 75 MG tablet Take 1 tablet (75 mg total) by mouth daily.     colchicine  0.6 MG tablet Take 0.6 mg by mouth daily.     cyanocobalamin  (VITAMIN B12) 1000 MCG tablet Take 1,000 mcg by mouth daily.     Ferrous Sulfate  (IRON) 325 (65 Fe) MG TABS Take 1 tablet by mouth every other day.     polyethylene glycol (MIRALAX  / GLYCOLAX ) 17 g packet Take 17 g by mouth daily.     oxyCODONE  (OXY IR/ROXICODONE ) 5 MG immediate release tablet Take 1 tablet (5 mg total) by mouth every 6 (six) hours as needed for severe pain (pain score 7-10). (Patient not taking: Reported on 11/02/2023) 10 tablet 0   Current Facility-Administered Medications on File Prior to Visit  Medication Dose Route Frequency Provider Last Rate Last Admin   heparin  lock flush 100 unit/mL  500 Units Intravenous Once Corcoran, Melissa C, [name]       sodium chloride  0.9 % injection 10 mL  10 mL Intravenous PRN Gittin, Robert G, [name]   10 mL at 08/26/14 1300   sodium chloride  flush (NS) 0.9 % injection 10 mL  10 mL Intravenous PRN Corcoran, Melissa C, [name]        There are no Patient Instructions on file for this visit. No follow-ups on file.   Stanton Kissoon E Hideo Googe, NP

## 2023-11-09 ENCOUNTER — Other Ambulatory Visit: Admitting: Urology

## 2023-11-22 ENCOUNTER — Ambulatory Visit: Admitting: Urology

## 2023-11-22 VITALS — BP 157/83 | HR 66 | Wt 160.0 lb

## 2023-11-22 DIAGNOSIS — N390 Urinary tract infection, site not specified: Secondary | ICD-10-CM

## 2023-11-22 LAB — URINALYSIS, COMPLETE
Bilirubin, UA: NEGATIVE
Glucose, UA: NEGATIVE
Ketones, UA: NEGATIVE
Nitrite, UA: NEGATIVE
Specific Gravity, UA: 1.015 (ref 1.005–1.030)
Urobilinogen, Ur: 0.2 mg/dL (ref 0.2–1.0)
pH, UA: 6 (ref 5.0–7.5)

## 2023-11-22 LAB — MICROSCOPIC EXAMINATION
RBC, Urine: >30 /HPF — AB (ref 0–2)
WBC, UA: >30 /HPF — AB (ref 0–5)

## 2023-11-22 MED ORDER — SULFAMETHOXAZOLE-TRIMETHOPRIM 800-160 MG PO TABS: 1.0000 | ORAL_TABLET | Freq: Two times a day (BID) | ORAL | 0 refills | Status: DC

## 2023-11-22 NOTE — Progress Notes (Signed)
 Scheduled for cystoscopy today however having urinary frequency, urgency, dysuria and UA with >30 WBC/>30 RBC.  Urine culture ordered and will send Rx Septra  DS to pharmacy pending culture report  Cystoscopy postponed today and will be rescheduled

## 2023-11-25 LAB — CULTURE, URINE COMPREHENSIVE

## 2023-11-27 ENCOUNTER — Ambulatory Visit: Payer: Self-pay | Admitting: Urology

## 2023-11-28 ENCOUNTER — Other Ambulatory Visit: Payer: Self-pay

## 2023-11-28 DIAGNOSIS — N390 Urinary tract infection, site not specified: Secondary | ICD-10-CM

## 2023-11-28 MED ORDER — SULFAMETHOXAZOLE-TRIMETHOPRIM 800-160 MG PO TABS: 1.0000 | ORAL_TABLET | Freq: Two times a day (BID) | ORAL | 0 refills | Status: AC

## 2023-11-28 NOTE — Telephone Encounter (Signed)
 Called pts wife and explained extending antibiotics and when we schedule a cysto.

## 2023-11-28 NOTE — Telephone Encounter (Signed)
 Per Dr. Twylla I called patient and let his Wife know that we are extending his antiobiotics that he is currently taking for UTI to 30 days and that we scheduled him a cysto at 1245 on 8/27 to be done while he is taking antibiotics. Patients wife understood.

## 2023-12-14 ENCOUNTER — Other Ambulatory Visit: Payer: Self-pay

## 2023-12-14 ENCOUNTER — Ambulatory Visit: Admitting: Urology

## 2023-12-14 VITALS — BP 154/82 | HR 79 | Ht 67.0 in | Wt 160.0 lb

## 2023-12-14 DIAGNOSIS — N35912 Unspecified bulbous urethral stricture, male: Secondary | ICD-10-CM | POA: Diagnosis not present

## 2023-12-14 DIAGNOSIS — R3129 Other microscopic hematuria: Secondary | ICD-10-CM | POA: Diagnosis not present

## 2023-12-14 NOTE — Progress Notes (Signed)
 12/14/2023 12:55 PM   [name] [birth year] [id number]  Referring provider: No referring provider defined for this encounter.  Chief Complaint  Patient presents with   Cysto    HPI: [name] [name]. is a 68 y.o. male seen by Dr. Penne as an intraoperative consult 07/07/2023 for inability to pass a Foley catheter prior to a vascular surgery procedure.  He was found to have a dense bulbar stricture which was dilated with disposable dilators and a 16 French Councill catheter was placed.  Was scheduled for cystoscopy for persistent microhematuria.  Cystoscopy performed today remarkable for a ~10 French bulbar stricture.  Cystoscopy under anesthesia and balloon dilation of urethral stricture has been recommended   PMH: Past Medical History:  Diagnosis Date   Atherosclerosis    Cancer (HCC)    CHF (congestive heart failure) (HCC)    Chronic kidney disease    kidney stones, UTI   Colon cancer (HCC)    Coronary artery disease    Diabetes mellitus without complication (HCC)    GERD (gastroesophageal reflux disease)    Gout    Hematuria    Hyperlipidemia    Hypertension    Iron deficiency anemia    Myocardial infarction [facility]) 2005   Peripheral vascular disease (HCC)    Pulmonary nodules    Rectal cancer (HCC)    Ureter, stricture    Wears dentures    full upper    Surgical History: Past Surgical History:  Procedure Laterality Date   BOWEL RESECTION N/A 10/23/2014   Procedure: LOW ANTERIOR BOWEL RESECTION;  Surgeon: Oneil Chang, MD;  Location: [facility];  Service: General;  Laterality: N/A;   COLON SURGERY     COLONOSCOPY 06/06/14     COLONOSCOPY WITH ESOPHAGOGASTRODUODENOSCOPY (EGD)     CORONARY ANGIOPLASTY WITH STENT PLACEMENT  03/2004   CYSTOSCOPY WITH STENT PLACEMENT Bilateral 10/23/2014   Procedure: CYSTOSCOPY WITH STENT PLACEMENT,URETHRAL DILATION, LEFT RETROGRADE PYELOGRAM, URETEROSCOPY;  Surgeon: Rosina Penne, MD;  Location: [facility];  Service: Urology;   Laterality: Bilateral;   DIVERTING ILEOSTOMY N/A 10/23/2014   Procedure: DIVERTING ILEOSTOMY;  Surgeon: Oneil Chang, MD;  Location: [facility];  Service: General;  Laterality: N/A;   FEMORAL-TIBIAL BYPASS GRAFT Left 07/11/2023   Procedure: CREATION, BYPASS, ARTERIAL, FEMORAL TO TIBIAL, USING GRAFT, INSITU;  Surgeon: Marea Selinda RAMAN, MD;  Location: [facility];  Service: Vascular;  Laterality: Left;   ILEOSTOMY CLOSURE N/A 09/08/2015   Procedure: ILEOSTOMY TAKEDOWN;  Surgeon: Carlin Pastel, MD;  Location: [facility];  Service: General;  Laterality: N/A;   LAPAROTOMY N/A 10/23/2014   Procedure: EXPLORATORY LAPAROTOMY;  Surgeon: Oneil Chang, MD;  Location: [facility];  Service: General;  Laterality: N/A;   LOWER EXTREMITY ANGIOGRAPHY Left 07/06/2023   Procedure: Lower Extremity Angiography;  Surgeon: Marea Selinda RAMAN, MD;  Location: [facility];  Service: Cardiovascular;  Laterality: Left;   PERIPHERAL VASCULAR CATHETERIZATION N/A 05/12/2015   Procedure: Abdominal Aortogram w/Lower Extremity;  Surgeon: Selinda RAMAN Marea, MD;  Location: [facility];  Service: Cardiovascular;  Laterality: N/A;   PERIPHERAL VASCULAR CATHETERIZATION  05/12/2015   Procedure: Lower Extremity Intervention;  Surgeon: Selinda RAMAN Marea, MD;  Location: [facility];  Service: Cardiovascular;;   PERIPHERAL VASCULAR CATHETERIZATION N/A 06/30/2015   Procedure: Abdominal Aortogram w/Lower Extremity;  Surgeon: Selinda RAMAN Marea, MD;  Location: [facility];  Service: Cardiovascular;  Laterality: N/A;   PERIPHERAL VASCULAR CATHETERIZATION  06/30/2015   Procedure: Lower Extremity Intervention;  Surgeon: Selinda GORMAN Gu, MD;  Location[facility];  Service: Cardiovascular;;   PORT-A-CATH REMOVAL Right 09/08/2015   Procedure: REMOVAL PORT-A-CATH;  Surgeon: Carlin Pastel, MD;  Location: [facility];  Service: General;  Laterality: Right;   PORTACATH PLACEMENT  07/01/2014   Dr. Eluterio   TRANSMETATARSAL AMPUTATION Left 07/15/2023   Procedure:  AMPUTATION, FOOT, TRANSMETATARSAL;  Surgeon: Ashley Soulier, DPM;  Location: [facility];  Service: Orthopedics/Podiatry;  Laterality: Left;    Home Medications:  Allergies as of 12/14/2023       Reactions   Entresto  [sacubitril -valsartan ] Cough   Lisinopril  Cough        Medication List        Accurate as of December 14, 2023 12:55 PM. If you have any questions, ask your nurse or doctor.          acetaminophen  500 MG tablet Commonly known as: TYLENOL  Take 500 mg by mouth every 6 (six) hours as needed.   aspirin  EC 81 MG tablet Take 1 tablet (81 mg total) by mouth daily. Swallow whole.   carvedilol  3.125 MG tablet Commonly known as: COREG  Take 3.125 mg by mouth 2 (two) times daily with a meal.   clopidogrel  75 MG tablet Commonly known as: PLAVIX  Take 1 tablet (75 mg total) by mouth daily.   colchicine  0.6 MG tablet Take 0.6 mg by mouth daily.   cyanocobalamin  1000 MCG tablet Commonly known as: VITAMIN B12 Take 1,000 mcg by mouth daily.   Dulcolax 5 MG EC tablet Generic drug: bisacodyl  Take 1 tablet (5 mg total) by mouth daily as needed for severe constipation.   Iron 325 (65 Fe) MG Tabs Take 1 tablet by mouth every other day.   oxyCODONE  5 MG immediate release tablet Commonly known as: Oxy IR/ROXICODONE  Take 1 tablet (5 mg total) by mouth every 6 (six) hours as needed for severe pain (pain score 7-10).   polyethylene glycol 17 g packet Commonly known as: MIRALAX  / GLYCOLAX  Take 17 g by mouth daily.   sulfamethoxazole -trimethoprim  800-160 MG tablet Commonly known as: BACTRIM  DS Take 1 tablet by mouth 2 (two) times daily for 20 days.   vitamin C  1000 MG tablet Take 1,000 mg by mouth daily. Reported on 09/26/2015        Allergies:  Allergies  Allergen Reactions   Entresto  [Sacubitril -Valsartan ] Cough   Lisinopril  Cough    Family History: Family History  Problem Relation Age of Onset   Hypertension Brother    Hypertension Father    Diabetes Father     Diabetes Brother    Hypertension Brother    Heart disease Paternal Grandfather     Social History:  reports that he quit smoking about 39 years ago. His smoking use included cigarettes. He has never used smokeless tobacco. He reports that he does not drink alcohol and does not use drugs.   Physical Exam: BP (!) 154/82   Pulse 79   Ht 5' 7 (1.702 m)   Wt 160 lb (72.6 kg)   BMI 25.06 kg/m   Constitutional:  Alert, No acute distress. HEENT: [place] AT Respiratory: Normal respiratory effort, no increased work of breathing. Psychiatric: Normal mood and affect.   Pertinent Imaging:  CT HEMATURIA WORKUP  Narrative CLINICAL DATA:  Gross hematuria, history of colon cancer with partial colectomy * Tracking Code: BO *  EXAM: CT ABDOMEN AND PELVIS WITHOUT AND WITH CONTRAST  TECHNIQUE: Multidetector CT imaging of the abdomen and pelvis was performed following the standard protocol  before and following the bolus administration of intravenous contrast.  RADIATION DOSE REDUCTION: This exam was performed according to the departmental dose-optimization program which includes automated exposure control, adjustment of the mA and/or kV according to patient size and/or use of iterative reconstruction technique.  CONTRAST:  OMNIPAQUE  IOHEXOL  300 MG/ML  SOLN  COMPARISON:  02/19/2022  FINDINGS: Lower chest: No acute abnormality. Cardiomegaly. Extensive three-vessel coronary artery calcifications.  Hepatobiliary: No solid liver abnormality is seen. No gallstones, gallbladder wall thickening, or biliary dilatation.  Pancreas: Unremarkable. No pancreatic ductal dilatation or surrounding inflammatory changes.  Spleen: Normal in size without significant abnormality.  Adrenals/Urinary Tract: Adrenal glands are unremarkable. Lobulated bilateral renal cortical scarring. No calculi or hydronephrosis. Simple, benign bilateral renal cortical cysts, for which no further follow-up or  characterization is required. Diffuse urinary bladder wall thickening and mucosal hyperenhancement, somewhat right eccentric, measuring up to 1.2 cm in thickness (series 10, image 74).  Stomach/Bowel: Stomach is within normal limits. Appendix appears normal. No evidence of bowel wall thickening, distention, or inflammatory changes. Status post rectosigmoid colon resection and reanastomosis.  Vascular/Lymphatic: Severe aortic atherosclerosis. Bilateral external iliac artery stents. No enlarged abdominal or pelvic lymph nodes.  Reproductive: No mass or other abnormality.  Other: Right lower quadrant parastomal hernia containing multiple loops of nonobstructed distal small bowel. Mid small bowel resection and reanastomosis. No ascites.  Musculoskeletal: No acute or significant osseous findings.  IMPRESSION: 1. Diffuse urinary bladder wall thickening and mucosal hyperenhancement, somewhat right eccentric, measuring up to 1.2 cm in thickness. Findings are most consistent with nonspecific infectious or inflammatory cystitis however sessile bladder malignancy is not strictly excluded. Correlate with urinalysis. 2. No urinary tract calculi or hydronephrosis. No renal mass or urinary tract filling defect on delayed phase imaging. 3. Status post rectosigmoid colon resection and reanastomosis. No evidence of recurrent or metastatic disease in the abdomen or pelvis. 4. Right lower quadrant parastomal hernia containing multiple loops of nonobstructed distal small bowel. 5. Cardiomegaly and coronary artery disease.  Aortic Atherosclerosis (ICD10-I70.0).   Electronically Signed By: Marolyn JONETTA Jaksch M.D. On: 10/13/2023 07:24   Assessment & Plan:    1.  Bulbar urethral stricture-recurrent Balloon dilation urethral stricture The procedure was discussed in detail including potential risks of bleeding, infection and recurrent stricture.  All questions were answered and he desires to  schedule  2.  Microhematuria Most likely secondary to above Cystoscopy will be performed after stricture dilation and he is agreeable to management of any abnormal findings including bladder biopsy, TURBT   Glendia JAYSON Barba, MD  [facility] [address], [address] [place], [place] [address] [phone]

## 2023-12-14 NOTE — Progress Notes (Signed)
   12/14/23  CC:  Chief Complaint  Patient presents with   Cysto    HPI: History of bulbar urethral stricture.  Persistent microhematuria.  Blood pressure (!) 154/82, pulse 79, height 5' 7 (1.702 m), weight 160 lb (72.6 kg). NED. A&Ox3.   No respiratory distress   Abd soft, NT, ND Normal phallus with bilateral descended testicles  Cystoscopy Procedure Note  Patient identification was confirmed, informed consent was obtained, and patient was prepped using Betadine  solution.  Lidocaine  jelly was administered per urethral meatus.     Pre-Procedure: - Inspection reveals a normal caliber urethral meatus.  Procedure: The flexible cystoscope was introduced without difficulty - Wide caliber penile urethral strictures are present. - 56F bulbar urethral stricture   Post-Procedure: - Patient tolerated the procedure well  Assessment/ Plan: Recurrent urethral stricture Schedule balloon dilation/cystoscopy Procedure discussed accompanying office visit note    Glendia JAYSON Barba, MD

## 2023-12-14 NOTE — Progress Notes (Unsigned)
 Surgical Physician Order Form [facility] [place]  Dr. Glendia Barba, MD   * Scheduling expectation : Next Available  *Length of Case: 30 minutes  *Clearance needed: yes  *Anticoagulation Instructions: Hold all anticoagulants  *Aspirin  Instructions: Hold Plavix  ok to continue ASA  *Post-op visit Date/Instructions:  1-3 day cath removal  *Diagnosis: Urethral Stricture; microhematuria  *Procedure:  Ureteroscopy w/ballon dilation (47655); possible bladder biopsy, possible TURBT   Additional orders: N/A  -Admit type: OUTpatient  -Anesthesia: Choice  -VTE Prophylaxis Standing Order SCD's       Other:   -Standing Lab Orders Per Anesthesia    Lab other: UA&Urine Culture  -Standing Test orders EKG/Chest x-ray per Anesthesia       Test other:   - Medications: Based on preop urine culture  -Other orders:  N/A

## 2023-12-14 NOTE — H&P (View-Only) (Signed)
 12/14/2023 12:55 PM   [name] [birth year] [id number]  Referring provider: No referring provider defined for this encounter.  Chief Complaint  Patient presents with   Cysto    HPI: [name] [name]. is a 68 y.o. male seen by Dr. Penne as an intraoperative consult 07/07/2023 for inability to pass a Foley catheter prior to a vascular surgery procedure.  He was found to have a dense bulbar stricture which was dilated with disposable dilators and a 16 French Councill catheter was placed.  Was scheduled for cystoscopy for persistent microhematuria.  Cystoscopy performed today remarkable for a ~10 French bulbar stricture.  Cystoscopy under anesthesia and balloon dilation of urethral stricture has been recommended   PMH: Past Medical History:  Diagnosis Date   Atherosclerosis    Cancer (HCC)    CHF (congestive heart failure) (HCC)    Chronic kidney disease    kidney stones, UTI   Colon cancer (HCC)    Coronary artery disease    Diabetes mellitus without complication (HCC)    GERD (gastroesophageal reflux disease)    Gout    Hematuria    Hyperlipidemia    Hypertension    Iron deficiency anemia    Myocardial infarction [facility]) 2005   Peripheral vascular disease (HCC)    Pulmonary nodules    Rectal cancer (HCC)    Ureter, stricture    Wears dentures    full upper    Surgical History: Past Surgical History:  Procedure Laterality Date   BOWEL RESECTION N/A 10/23/2014   Procedure: LOW ANTERIOR BOWEL RESECTION;  Surgeon: Oneil Chang, MD;  Location: [facility] OR[facility];  Service: General;  Laterality: N/A;   COLON SURGERY     COLONOSCOPY 06/06/14     COLONOSCOPY WITH ESOPHAGOGASTRODUODENOSCOPY (EGD)     CORONARY ANGIOPLASTY WITH STENT PLACEMENT  03/2004   CYSTOSCOPY WITH STENT PLACEMENT Bilateral 10/23/2014   Procedure: CYSTOSCOPY WITH STENT PLACEMENT,URETHRAL DILATION, LEFT RETROGRADE PYELOGRAM, URETEROSCOPY;  Surgeon: Rosina Penne, MD;  Location: [facility];  Service: Urology;   Laterality: Bilateral;   DIVERTING ILEOSTOMY N/A 10/23/2014   Procedure: DIVERTING ILEOSTOMY;  Surgeon: Oneil Chang, MD;  Location: [facility];  Service: General;  Laterality: N/A;   FEMORAL-TIBIAL BYPASS GRAFT Left 07/11/2023   Procedure: CREATION, BYPASS, ARTERIAL, FEMORAL TO TIBIAL, USING GRAFT, INSITU;  Surgeon: Marea Selinda RAMAN, MD;  Location: [facility];  Service: Vascular;  Laterality: Left;   ILEOSTOMY CLOSURE N/A 09/08/2015   Procedure: ILEOSTOMY TAKEDOWN;  Surgeon: Carlin Pastel, MD;  Location: [facility];  Service: General;  Laterality: N/A;   LAPAROTOMY N/A 10/23/2014   Procedure: EXPLORATORY LAPAROTOMY;  Surgeon: Oneil Chang, MD;  Location: [facility];  Service: General;  Laterality: N/A;   LOWER EXTREMITY ANGIOGRAPHY Left 07/06/2023   Procedure: Lower Extremity Angiography;  Surgeon: Marea Selinda RAMAN, MD;  Location: [facility] INVAS[facility];  Service: Cardiovascular;  Laterality: Left;   PERIPHERAL VASCULAR CATHETERIZATION N/A 05/12/2015   Procedure: Abdominal Aortogram w/Lower Extremity;  Surgeon: Selinda RAMAN Marea, MD;  Location: [facility] LAB;  Service: Cardiovascular;  Laterality: N/A;   PERIPHERAL VASCULAR CATHETERIZATION  05/12/2015   Procedure: Lower Extremity Intervention;  Surgeon: Selinda RAMAN Marea, MD;  Location: [facility];  Service: Cardiovascular;;   PERIPHERAL VASCULAR CATHETERIZATION N/A 06/30/2015   Procedure: Abdominal Aortogram w/Lower Extremity;  Surgeon: Selinda RAMAN Marea, MD;  Location: [facility];  Service: Cardiovascular;  Laterality: N/A;   PERIPHERAL VASCULAR CATHETERIZATION  06/30/2015   Procedure: Lower Extremity Intervention;  Surgeon: Selinda GORMAN Gu, MD;  Location[facility] INVASIVE CV LAB;  Service: Cardiovascular;;   PORT-A-CATH REMOVAL Right 09/08/2015   Procedure: REMOVAL PORT-A-CATH;  Surgeon: Carlin Pastel, MD;  Location: [facility];  Service: General;  Laterality: Right;   PORTACATH PLACEMENT  07/01/2014   Dr. Eluterio   TRANSMETATARSAL AMPUTATION Left 07/15/2023   Procedure:  AMPUTATION, FOOT, TRANSMETATARSAL;  Surgeon: Ashley Soulier, DPM;  Location: [facility];  Service: Orthopedics/Podiatry;  Laterality: Left;    Home Medications:  Allergies as of 12/14/2023       Reactions   Entresto  [sacubitril -valsartan ] Cough   Lisinopril  Cough        Medication List        Accurate as of December 14, 2023 12:55 PM. If you have any questions, ask your nurse or doctor.          acetaminophen  500 MG tablet Commonly known as: TYLENOL  Take 500 mg by mouth every 6 (six) hours as needed.   aspirin  EC 81 MG tablet Take 1 tablet (81 mg total) by mouth daily. Swallow whole.   carvedilol  3.125 MG tablet Commonly known as: COREG  Take 3.125 mg by mouth 2 (two) times daily with a meal.   clopidogrel  75 MG tablet Commonly known as: PLAVIX  Take 1 tablet (75 mg total) by mouth daily.   colchicine  0.6 MG tablet Take 0.6 mg by mouth daily.   cyanocobalamin  1000 MCG tablet Commonly known as: VITAMIN B12 Take 1,000 mcg by mouth daily.   Dulcolax 5 MG EC tablet Generic drug: bisacodyl  Take 1 tablet (5 mg total) by mouth daily as needed for severe constipation.   Iron 325 (65 Fe) MG Tabs Take 1 tablet by mouth every other day.   oxyCODONE  5 MG immediate release tablet Commonly known as: Oxy IR/ROXICODONE  Take 1 tablet (5 mg total) by mouth every 6 (six) hours as needed for severe pain (pain score 7-10).   polyethylene glycol 17 g packet Commonly known as: MIRALAX  / GLYCOLAX  Take 17 g by mouth daily.   sulfamethoxazole -trimethoprim  800-160 MG tablet Commonly known as: BACTRIM  DS Take 1 tablet by mouth 2 (two) times daily for 20 days.   vitamin C  1000 MG tablet Take 1,000 mg by mouth daily. Reported on 09/26/2015        Allergies:  Allergies  Allergen Reactions   Entresto  [Sacubitril -Valsartan ] Cough   Lisinopril  Cough    Family History: Family History  Problem Relation Age of Onset   Hypertension Brother    Hypertension Father    Diabetes Father     Diabetes Brother    Hypertension Brother    Heart disease Paternal Grandfather     Social History:  reports that he quit smoking about 39 years ago. His smoking use included cigarettes. He has never used smokeless tobacco. He reports that he does not drink alcohol and does not use drugs.   Physical Exam: BP (!) 154/82   Pulse 79   Ht 5' 7 (1.702 m)   Wt 160 lb (72.6 kg)   BMI 25.06 kg/m   Constitutional:  Alert, No acute distress. HEENT: [place] AT Respiratory: Normal respiratory effort, no increased work of breathing. Psychiatric: Normal mood and affect.   Pertinent Imaging:  CT HEMATURIA WORKUP  Narrative CLINICAL DATA:  Gross hematuria, history of colon cancer with partial colectomy * Tracking Code: BO *  EXAM: CT ABDOMEN AND PELVIS WITHOUT AND WITH CONTRAST  TECHNIQUE: Multidetector CT imaging of the abdomen and pelvis was performed following the standard protocol  before and following the bolus administration of intravenous contrast.  RADIATION DOSE REDUCTION: This exam was performed according to the departmental dose-optimization program which includes automated exposure control, adjustment of the mA and/or kV according to patient size and/or use of iterative reconstruction technique.  CONTRAST:  OMNIPAQUE  IOHEXOL  300 MG/ML  SOLN  COMPARISON:  02/19/2022  FINDINGS: Lower chest: No acute abnormality. Cardiomegaly. Extensive three-vessel coronary artery calcifications.  Hepatobiliary: No solid liver abnormality is seen. No gallstones, gallbladder wall thickening, or biliary dilatation.  Pancreas: Unremarkable. No pancreatic ductal dilatation or surrounding inflammatory changes.  Spleen: Normal in size without significant abnormality.  Adrenals/Urinary Tract: Adrenal glands are unremarkable. Lobulated bilateral renal cortical scarring. No calculi or hydronephrosis. Simple, benign bilateral renal cortical cysts, for which no further follow-up or  characterization is required. Diffuse urinary bladder wall thickening and mucosal hyperenhancement, somewhat right eccentric, measuring up to 1.2 cm in thickness (series 10, image 74).  Stomach/Bowel: Stomach is within normal limits. Appendix appears normal. No evidence of bowel wall thickening, distention, or inflammatory changes. Status post rectosigmoid colon resection and reanastomosis.  Vascular/Lymphatic: Severe aortic atherosclerosis. Bilateral external iliac artery stents. No enlarged abdominal or pelvic lymph nodes.  Reproductive: No mass or other abnormality.  Other: Right lower quadrant parastomal hernia containing multiple loops of nonobstructed distal small bowel. Mid small bowel resection and reanastomosis. No ascites.  Musculoskeletal: No acute or significant osseous findings.  IMPRESSION: 1. Diffuse urinary bladder wall thickening and mucosal hyperenhancement, somewhat right eccentric, measuring up to 1.2 cm in thickness. Findings are most consistent with nonspecific infectious or inflammatory cystitis however sessile bladder malignancy is not strictly excluded. Correlate with urinalysis. 2. No urinary tract calculi or hydronephrosis. No renal mass or urinary tract filling defect on delayed phase imaging. 3. Status post rectosigmoid colon resection and reanastomosis. No evidence of recurrent or metastatic disease in the abdomen or pelvis. 4. Right lower quadrant parastomal hernia containing multiple loops of nonobstructed distal small bowel. 5. Cardiomegaly and coronary artery disease.  Aortic Atherosclerosis (ICD10-I70.0).   Electronically Signed By: Marolyn JONETTA Jaksch M.D. On: 10/13/2023 07:24   Assessment & Plan:    1.  Bulbar urethral stricture-recurrent Balloon dilation urethral stricture The procedure was discussed in detail including potential risks of bleeding, infection and recurrent stricture.  All questions were answered and he desires to  schedule  2.  Microhematuria Most likely secondary to above Cystoscopy will be performed after stricture dilation and he is agreeable to management of any abnormal findings including bladder biopsy, TURBT   Glendia JAYSON Barba, MD  [facility] [address], [address] [place], [place] [address] [phone]

## 2023-12-15 ENCOUNTER — Telehealth: Payer: Self-pay

## 2023-12-15 NOTE — Telephone Encounter (Signed)
 Per Dr. 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   Patient was instructed that Dr. Twylla will require them to provide a pre-op UA & CX prior to surgery. This was ordered and scheduled drop off appointment was made for 12/26/2023.    Patient was directed to call [phone] between 1-3pm the day before surgery to find out surgical arrival time.  Instructions were given not to eat or drink from midnight on the night before surgery and have a driver for the day of surgery. On the surgery day patient was instructed to enter through the Medical Mall entrance of [facility] report the Same Day Surgery desk.   Pre-Admit Testing will be in contact via phone to set up an interview with the anesthesia team to review your history and medications prior to surgery.   Reminder of this information was sent via MyChart to the patient.

## 2023-12-15 NOTE — Progress Notes (Signed)
  Phone Number: [phone] for Surgical Coordinator Fax Number: [phone]  REQUEST FOR SURGICAL CLEARANCE      Date: Date: 12/15/23  Faxed to: Dr. Florencio, MD  Surgeon: Dr. 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 YES []      NO  []   Appointment scheduled for: _______________________   Further recommendations: ____________________________________     Physician Signature:__________________________________   Printed Name: ________________________________________   Date: _________________

## 2023-12-15 NOTE — Progress Notes (Signed)
   [facility] Surgical Posting Form  Surgery Date: Date: 01/05/2024  Surgeon: Dr. 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

## 2023-12-26 ENCOUNTER — Other Ambulatory Visit

## 2023-12-26 DIAGNOSIS — R3129 Other microscopic hematuria: Secondary | ICD-10-CM

## 2023-12-26 DIAGNOSIS — N35912 Unspecified bulbous urethral stricture, male: Secondary | ICD-10-CM

## 2023-12-26 LAB — URINALYSIS, COMPLETE
Bilirubin, UA: NEGATIVE
Glucose, UA: NEGATIVE
Ketones, UA: NEGATIVE
Nitrite, UA: NEGATIVE
Protein,UA: NEGATIVE
Specific Gravity, UA: 1.015 (ref 1.005–1.030)
Urobilinogen, Ur: 0.2 mg/dL (ref 0.2–1.0)
pH, UA: 6 (ref 5.0–7.5)

## 2023-12-26 LAB — MICROSCOPIC EXAMINATION
Epithelial Cells (non renal): >10 /HPF — AB (ref 0–10)
RBC, Urine: >30 /HPF — AB (ref 0–2)

## 2023-12-29 ENCOUNTER — Other Ambulatory Visit: Payer: Self-pay

## 2023-12-29 ENCOUNTER — Encounter
Admission: RE | Admit: 2023-12-29 | Discharge: 2023-12-29 | Disposition: A | Discharge status: Home / Self Care | Source: Ambulatory Visit | Attending: Urology | Admitting: Urology

## 2023-12-29 DIAGNOSIS — E119 Type 2 diabetes mellitus without complications: Secondary | ICD-10-CM

## 2023-12-29 DIAGNOSIS — I10 Essential (primary) hypertension: Secondary | ICD-10-CM

## 2023-12-29 DIAGNOSIS — Z01810 Encounter for preprocedural cardiovascular examination: Secondary | ICD-10-CM

## 2023-12-29 DIAGNOSIS — N35819 Other urethral stricture, male, unspecified site: Secondary | ICD-10-CM

## 2023-12-29 DIAGNOSIS — I5023 Acute on chronic systolic (congestive) heart failure: Secondary | ICD-10-CM

## 2023-12-29 HISTORY — DX: Other microscopic hematuria: R31.29

## 2023-12-29 HISTORY — DX: Unspecified osteoarthritis, unspecified site: M19.90

## 2023-12-29 NOTE — Patient Instructions (Addendum)
 Your procedure is scheduled on: 01/05/24 - Thursday Report to the Registration Desk on the 1st floor of the Medical Mall. To find out your arrival time, please call [phone] between 1PM - 3PM on: 01/04/24 - Wednesday If your arrival time is 6:00 am, do not arrive before that time as the Medical Mall entrance doors do not open until 6:00 am.  REMEMBER: Instructions that are not followed completely may result in serious medical risk, up to and including death; or upon the discretion of your surgeon and anesthesiologist your surgery may need to be rescheduled.  Do not eat food or drink any liquids after midnight the night before surgery.  No gum chewing or hard candies.  One week prior to surgery: Stop Anti-inflammatories (NSAIDS) such as Advil, Aleve, Ibuprofen, Motrin, Naproxen, Naprosyn and Aspirin  based products such as Excedrin, Goody's Powder, BC Powder. You may continue to take Tylenol  if needed for pain up until the day of surgery.  Stop ANY OVER THE COUNTER supplements until after surgery : VITAMIN C   HOLD clopidogrel  (PLAVIX ) beginning 09/13, may resume with doctors instructions.  ON THE DAY OF SURGERY ONLY TAKE THESE MEDICATIONS WITH SIPS OF WATER:  carvedilol  (COREG )  colchicine     No Alcohol for 24 hours before or after surgery.  No Smoking including e-cigarettes for 24 hours before surgery.  No chewable tobacco products for at least 6 hours before surgery.  No nicotine patches on the day of surgery.  Do not use any recreational drugs for at least a week (preferably 2 weeks) before your surgery.  Please be advised that the combination of cocaine and anesthesia may have negative outcomes, up to and including death. If you test positive for cocaine, your surgery will be cancelled.  On the morning of surgery brush your teeth with toothpaste and water, you may rinse your mouth with mouthwash if you wish. Do not swallow any toothpaste or mouthwash.  Do not wear  jewelry, make-up, hairpins, clips or nail polish.  For welded (permanent) jewelry: bracelets, anklets, waist bands, etc.  Please have this removed prior to surgery.  If it is not removed, there is a chance that hospital personnel will need to cut it off on the day of surgery.  Do not wear lotions, powders, or perfumes.   Do not shave body hair from the neck down 48 hours before surgery.  Contact lenses, hearing aids and dentures may not be worn into surgery.  Do not bring valuables to the hospital. [facility] is not responsible for any missing/lost belongings or valuables.   Notify your doctor if there is any change in your medical condition (cold, fever, infection).  Wear comfortable clothing (specific to your surgery type) to the hospital.  After surgery, you can help prevent lung complications by doing breathing exercises.  Take deep breaths and cough every 1-2 hours. Your doctor may order a device called an Incentive Spirometer to help you take deep breaths.  When coughing or sneezing, hold a pillow firmly against your incision with both hands. This is called "splinting." Doing this helps protect your incision. It also decreases belly discomfort.  If you are being admitted to the hospital overnight, leave your suitcase in the car. After surgery it may be brought to your room.  In case of increased patient census, it may be necessary for you, the patient, to continue your postoperative care in the Same Day Surgery department.  If you are being discharged the day of surgery, you will not  be allowed to drive home. You will need a responsible individual to drive you home and stay with you for 24 hours after surgery.   If you are taking public transportation, you will need to have a responsible individual with you.  Please call the Pre-admissions Testing Dept. at [phone] if you have any questions about these instructions.  Surgery Visitation Policy:  Patients having surgery  or a procedure may have two visitors.  Children under the age of 60 must have an adult with them who is not the patient.  Inpatient Visitation:    Visiting hours are 7 a.m. to 8 p.m. Up to four visitors are allowed at one time in a patient room. The visitors may rotate out with other people during the day.  One visitor age 49 or older may stay with the patient overnight and must be in the room by 8 p.m.   Merchandiser, retail to address health-related social needs:  [email or web address]

## 2023-12-30 LAB — CULTURE, URINE COMPREHENSIVE

## 2024-01-02 ENCOUNTER — Encounter
Admission: RE | Admit: 2024-01-02 | Discharge: 2024-01-02 | Disposition: A | Discharge status: Home / Self Care | Source: Ambulatory Visit | Attending: Urology | Admitting: Urology

## 2024-01-02 DIAGNOSIS — R9431 Abnormal electrocardiogram [ECG] [EKG]: Secondary | ICD-10-CM | POA: Diagnosis not present

## 2024-01-02 DIAGNOSIS — Z01810 Encounter for preprocedural cardiovascular examination: Secondary | ICD-10-CM | POA: Diagnosis not present

## 2024-01-02 DIAGNOSIS — Z01818 Encounter for other preprocedural examination: Secondary | ICD-10-CM | POA: Diagnosis present

## 2024-01-03 ENCOUNTER — Encounter: Payer: Self-pay | Admitting: Urology

## 2024-01-03 ENCOUNTER — Telehealth: Payer: Self-pay

## 2024-01-03 NOTE — Progress Notes (Signed)
 Perioperative / Anesthesia Services  Pre-Admission Testing Clinical Review / Pre-Operative Anesthesia Consult  Date: 01/03/24  PATIENT DEMOGRAPHICS: Name: [name] DUNG [name] DOB: 06-30-1955 MRN:   [id number]  Note: Available PAT nursing documentation and vital signs have been reviewed. Clinical nursing staff has updated patient's PMH/PSHx, current medication list, and drug allergies/intolerances to ensure complete and comprehensive history available to assist care teams in MDM as it pertains to the aforementioned surgical procedure and anticipated anesthetic course. Extensive review of available clinical information personally performed. [facility] and [facility] updated with any diagnoses/procedures that  may have been inadvertently omitted during his intake with the pre-admission testing department's nursing staff.  PLANNED SURGICAL PROCEDURE(S):   Case: [id number] Date/Time: 01/05/24 1245   Procedures:      CYSTOURETHROSCOPY, WITH URETHRAL STRICTURE DILATION USING DRUG-COATED BALLOON     CYSTOSCOPY, WITH BIOPSY     TURBT (TRANSURETHRAL RESECTION OF BLADDER TUMOR)   Anesthesia type: Choice   Diagnosis:      Stricture of male urethra, unspecified stricture type [N35.919]     Microhematuria [R31.29]   Pre-op diagnosis: Microhematuria, Urethral Stricture   Location: [facility] OR ROOM 10 / [facility]   Surgeons: Twylla Glendia BROCKS, MD        CLINICAL DISCUSSION: [name] DEGREGORY [name] is a 68 y.o. male who is submitted for pre-surgical anesthesia review and clearance prior to him undergoing the above procedure. Patient is a Former Smoker (quit 11/1984). Pertinent PMH includes: CAD, NSTEMI, ischemic cardiomyopathy with resulting HFrEF, PVD, cardiomegaly, aortic atherosclerosis, HTN, HLD, T2DM, CKD-III, dyspnea, pulmonary nodules, GERD (no daily Tx), IDA, remote rectal CA, nephrolithiasis, urethral stricture, microhematuria, OA.  Patient is followed by cardiology Philippe,  MD). He was last seen in the cardiology clinic on 11/16/2023; notes reviewed. At the time of his clinic visit, patient doing well overall from a cardiovascular perspective. Patient denied any chest pain, shortness of breath, PND, orthopnea, palpitations, significant peripheral edema, weakness, fatigue, vertiginous symptoms, or presyncope/syncope. Patient with a past medical history significant for cardiovascular diagnoses. Documented physical exam was grossly benign, providing no evidence of acute exacerbation and/or decompensation of the patient's known cardiovascular conditions.  Patient suffered an NSTEMI on 04/14/2004.  Subsequent diagnostic LEFT heart catheterization was performed revealing significant single-vessel CAD with an 80% proximal lesion and 95% distal lesion.  PCI was performed placing a 3.0 x 13 mm Cypher stent to the proximal LAD and a 2.5 x 18 mm Cypher stent to the distal LAD.  Procedure yielded excellent angiographic result and TIMI-3 flow.  Most recent myocardial perfusion imaging study was performed on 12/13/2016 revealing a moderately reduced left ventricular systolic function with an EF of 30-44%.  There were no regional wall motion abnormalities.  No artifact or left ventricular cavity size enlargement appreciated on review of imaging. SPECT images demonstrated a medium defect of severe severity present in the apex location consistent with prior myocardial infarction.  TID ratio = 1.54 (normal range </= 1.2). Overall, study determined to be low risk.  Most recent TTE performed on 07/05/2023 revealed a severely reduced left ventricular systolic function with an EF of 20%.  Left ventricle is moderately dilated.  The mid and distal anterior septum, entire apex, and mid inferoseptal segment were akinetic. The inferior wall, posterior wall, basal anteroseptal segment, mid anterolateral segment, mid anterior segment, and basal inferoseptal segment were hypokinetic. Left ventricular  diastolic Doppler parameters consistent with abnormal relaxation (G1DD).  Left atrium mildly dilated. Right ventricular size and  function normal with a TAPSE measuring 2.2 cm  (normal range >/= 1.6 cm). There was mild mitral valve regurgitation.  All transvalvular gradients were noted to be normal providing no evidence of hemodynamically significant valvular stenosis. Aorta normal in size with no evidence of ectasia or aneurysmal dilatation.  Patient with a history of significant PVD.  He has required multiple PTA and stenting procedures with vascular surgery.  Most recently, patient underwent a LEFT femoral/tibial bypass on 07/10/2013.  Given patient's significant PVD, he remains on daily DAPT therapy using ASA + clopidogrel .  Patient reported be compliant with therapy with no evidence or reports of GI/GU related bleeding.  Ischemic cardiomyopathy and resulting HFrEF being managed with beta-blocker (carvedilol ), diuretic (spironolactone ), and ARB/ARNI (Entresto ) therapies.  Blood pressure elevated at 160/92 mmHg on the aforementioned regimen.  Questionable medication compliance was considered.  Patient currently not taking any type of lipid-lowering therapies for his HLD and further ASCVD prevention.  T2DM well-controlled on currently prescribed regimen.  Most recent hemoglobin A1c was 6.3% when checked on 07/05/2023.  Functional capacity limited by patient's age, multiple medical comorbidities, and previous foot amputations.  With that said, patient able complete all of his ADLs/IADLs without significant cardiovascular limitation.  Per the DASI, patient felt to be able to achieve at least 4 METS of physical activity without experiencing any significant degrees of angina/anginal equivalent symptoms. No changes were made to his medication regimen during his visit with cardiology.  Patient scheduled to follow-up with outpatient cardiology in 6 months or sooner if needed.  [name] ONEIDA Kirks [name] is scheduled for  an elective CYSTOURETHROSCOPY, WITH URETHRAL STRICTURE DILATION USING DRUG-COATED BALLOON; CYSTOSCOPY, WITH BIOPSY; TURBT (TRANSURETHRAL RESECTION OF BLADDER TUMOR) on 01/05/2024 with Dr. Glendia JAYSON Barba, MD. Given patient's past medical history significant for cardiovascular diagnoses, presurgical cardiac clearance was sought by the PAT team. Per cardiology, this patient is optimized for surgery and may proceed with the planned procedural course with a LOW risk of significant perioperative cardiovascular complications.  Again, this patient is on daily antithrombotic therapy. He has been instructed on recommendations for holding his clopidogrel  for 5 days prior to his procedure with plans to restart as soon as postoperative bleeding risk felt to be minimized by his primary attending surgeon. The patient has been instructed that his last dose should be on 12/30/2023. Given that patient's past medical history is significant for cardiovascular diagnoses, including but not limited to CAD, urology has cleared patient to continue his daily low dose ASA throughout his perioperative course. He will be asked to hold his normal dose on the day of his procedure only. Patient has been updated on these directives from his specialty care providers by the PAT team.  Patient denies previous perioperative complications with anesthesia in the past. In review his EMR, it is noted that patient underwent a MAC + regional anesthetic course here at [facility] ([facility] in 07/15/2023 without documented complications.   MOST RECENT VITAL SIGNS:    12/14/2023   12:41 PM 11/22/2023    8:55 AM 11/22/2023    8:54 AM  Vitals with BMI  Height 5' 7    Weight 160 lbs  160 lbs  BMI 25.05    Systolic 154 157 828  Diastolic 82 83 75  Pulse 79 66 64   PROVIDERS/SPECIALISTS: NOTE: Primary physician provider listed below. Patient may have been seen by APP or partner within same practice.   PROVIDER  ROLE / SPECIALTY LAST OV  Twylla Glendia BROCKS, MD Urology (Surgeon) 12/14/2023  [facility] Primary Care Provider ???  Florencio Shine, MD Cardiology 11/16/2023  Marea Mayo, MD Vascular Surgery 11/02/2023  [place], FNP-C Advanced Heart Failure 09/08/2023  Douglas Rule, MD Nephrology 10/18/2023   ALLERGIES: Allergies  Allergen Reactions   Entresto  [Sacubitril -Valsartan ] Cough   Lisinopril  Cough    CURRENT HOME MEDICATIONS: No current facility-administered medications for this encounter.    acetaminophen  (TYLENOL ) 500 MG tablet   Ascorbic Acid  (VITAMIN C ) 1000 MG tablet   aspirin  EC 81 MG tablet   bisacodyl  (DULCOLAX) 5 MG EC tablet   carvedilol  (COREG ) 3.125 MG tablet   clopidogrel  (PLAVIX ) 75 MG tablet   colchicine  0.6 MG tablet   cyanocobalamin  (VITAMIN B12) 1000 MCG tablet   Ferrous Sulfate  (IRON) 325 (65 Fe) MG TABS   polyethylene glycol (MIRALAX  / GLYCOLAX ) 17 g packet   sulfamethoxazole -trimethoprim  (BACTRIM  DS) 800-160 MG tablet    heparin  lock flush 100 unit/mL   sodium chloride  0.9 % injection 10 mL   sodium chloride  flush (NS) 0.9 % injection 10 mL   HISTORY: Past Medical History:  Diagnosis Date   Aortic atherosclerosis (HCC)    Arthritis    CKD (chronic kidney disease), stage III (HCC)    Coronary artery disease    Dyspnea    GERD (gastroesophageal reflux disease)    Gout    Hematuria    HFrEF (heart failure with reduced ejection fraction) (HCC)    Hyperlipidemia    Hypertension    Iron deficiency anemia    Ischemic cardiomyopathy    Long term current use of clopidogrel     Long-term use of aspirin  therapy    Microhematuria    Nephrolithiasis    NSTEMI (non-ST elevated myocardial infarction) (HCC) 04/14/2004   a.) LHC/PCI 04/16/2004: 80% pLAD (3.0 x 13 mm Cypher), 95% dLAD (2.5 x 18 mm Cypher)   Peripheral vascular disease (HCC)    a.) s/p LEFT fem-tib bypass 07/11/2023   Pulmonary nodules    Rectal cancer (HCC)    a.) stage IIA rectal  cancer s/p neoadjuvant chemotherapy + XRT + surgical resection (LAR)   T2DM (type 2 diabetes mellitus) (HCC)    Urethral stricture (recurrent)    Wears dentures    full upper   Past Surgical History:  Procedure Laterality Date   BOWEL RESECTION N/A 10/23/2014   Procedure: LOW ANTERIOR BOWEL RESECTION;  Surgeon: Oneil Chang, MD;  Location: [facility] ORS;  Service: General;  Laterality: N/A;   COLONOSCOPY N/A 06/17/2014   COLONOSCOPY WITH ESOPHAGOGASTRODUODENOSCOPY (EGD)     CORONARY ANGIOPLASTY WITH STENT PLACEMENT Left 04/16/2004   Procedure: CORONARY ANGIOPLASTY WITH STENT PLACEMENT; Location: [facility]; Surgeon: Shine Florencio, MD   CYSTOSCOPY WITH STENT PLACEMENT Bilateral 10/23/2014   Procedure: CYSTOSCOPY WITH STENT PLACEMENT,URETHRAL DILATION, LEFT RETROGRADE PYELOGRAM, URETEROSCOPY;  Surgeon: Rosina Riis, MD;  Location: [facility] ORS;  Service: Urology;  Laterality: Bilateral;   DIVERTING ILEOSTOMY N/A 10/23/2014   Procedure: DIVERTING ILEOSTOMY;  Surgeon: Oneil Chang, MD;  Location: [facility] ORS;  Service: General;  Laterality: N/A;   FEMORAL-TIBIAL BYPASS GRAFT Left 07/11/2023   Procedure: CREATION, BYPASS, ARTERIAL, FEMORAL TO TIBIAL, USING GRAFT, INSITU;  Surgeon: Marea Mayo RAMAN, MD;  Location: [facility] ORS;  Service: Vascular;  Laterality: Left;   ILEOSTOMY CLOSURE N/A 09/08/2015   Procedure: ILEOSTOMY TAKEDOWN;  Surgeon: Carlin Pastel, MD;  Location: [facility] ORS;  Service: General;  Laterality: N/A;   LAPAROTOMY N/A 10/23/2014   Procedure: EXPLORATORY LAPAROTOMY;  Surgeon: Oneil Chang, MD;  Location: [facility] ORS;  Service: General;  Laterality: N/A;   LOWER EXTREMITY ANGIOGRAPHY Left 07/06/2023   Procedure: Lower Extremity Angiography;  Surgeon: Marea Selinda RAMAN, MD;  Location: [facility];  Service: Cardiovascular;  Laterality: Left;   PERIPHERAL VASCULAR CATHETERIZATION N/A 05/12/2015   Procedure: Abdominal Aortogram w/Lower Extremity;  Surgeon: Selinda RAMAN Marea, MD;  Location: [facility];   Service: Cardiovascular;  Laterality: N/A;   PERIPHERAL VASCULAR CATHETERIZATION  05/12/2015   Procedure: Lower Extremity Intervention;  Surgeon: Selinda RAMAN Marea, MD;  Location: [facility];  Service: Cardiovascular;;   PERIPHERAL VASCULAR CATHETERIZATION N/A 06/30/2015   Procedure: Abdominal Aortogram w/Lower Extremity;  Surgeon: Selinda RAMAN Marea, MD;  Location: [facility];  Service: Cardiovascular;  Laterality: N/A;   PERIPHERAL VASCULAR CATHETERIZATION  06/30/2015   Procedure: Lower Extremity Intervention;  Surgeon: Selinda RAMAN Marea, MD;  Location: [facility];  Service: Cardiovascular;;   PORT-A-CATH REMOVAL Right 09/08/2015   Procedure: REMOVAL PORT-A-CATH;  Surgeon: Carlin Pastel, MD;  Location: [facility] OR[facility];  Service: General;  Laterality: Right;   PORTACATH PLACEMENT  07/01/2014   Dr. Eluterio   TRANSMETATARSAL AMPUTATION Left 07/15/2023   Procedure: AMPUTATION, FOOT, TRANSMETATARSAL;  Surgeon: Ashley Soulier, DPM;  Location: [facility] ORS;  Service: Orthopedics/Podiatry;  Laterality: Left;   Family History  Problem Relation Age of Onset   Hypertension Brother    Hypertension Father    Diabetes Father    Diabetes Brother    Hypertension Brother    Heart disease Paternal Grandfather    Social History   Tobacco Use   Smoking status: Former    Current packs/day: 0.00    Types: Cigarettes    Quit date: 11/19/1984    Years since quitting: 39.1   Smokeless tobacco: Never   Tobacco comments:    smoked for brief period in his teens  Substance Use Topics   Alcohol use: No    Alcohol/week: 0.0 standard drinks of alcohol   LABS:  Component Ref Range & Units 10/18/2023  WBC 3.8 - 10.8 Thousand/uL 5.5  RBC 4.20 - 5.80 Million/uL 4.55  Hemoglobin 13.2 - 17.1 g/dL 88.5 Low   Hematocrit 61.4 - 50.0 % 38.3 Low   MCV 80.0 - 100.0 fL 84.2  MCH 27.0 - 33.0 pg 25.1 Low   MCHC 32.0 - 36.0 g/dL 70.1 Low   RDW 88.9 - 84.9 % 14.3  Platelets 140 - 400 Thousand/uL 269   MPV 7.5 - 12.5 fL 11  Neutrophils Absolute 1500 - 7800 cells/uL 3,630  Lymphocytes Absolute 850 - 3900 cells/uL 968  Monocytes Absolute 200 - 950 cells/uL 462  Eosinophils Absolute 15 - 500 cells/uL 413  Basophils Absolute 0 - 200 cells/uL 28  Neutrophils Relative % 66  Lymphocytes % 17.6  Monocytes % 8.4  Eosinophils % 7.5  Basophils Relative % 0.5   Component Ref Range & Units 10/18/2023  Glucose 65 - 99 mg/dL 86  BUN 7 - 25 mg/dL 18  Creatinine 9.29 - 8.64 mg/dL 1  eGFR CKD-EPI CR 7978 > OR = 60 mL/min/1.43m2 81  Sodium 135 - 146 mmol/L 140  Potassium 3.5 - 5.3 mmol/L 4.2  Chloride 98 - 110 mmol/L 103  Bicarbonate (CO2) 20 - 32 mmol/L 29  Calcium  8.6 - 10.3 mg/dL 9.4  Phosphorus 2.1 - 4.3 mg/dL 3.2  Albumin  3.6 - 5.1 g/dL 4.3   Appointment on 90/91/7974  Component Date Value Ref Range Status   Urine Culture, Comprehensive 12/26/2023 Final  report   Final   Organism ID, Bacteria 12/26/2023 Comment   Final   No growth in 36 - 48 hours.   Specific Gravity, UA 12/26/2023 1.015  1.005 - 1.030 Final   pH, UA 12/26/2023 6.0  5.0 - 7.5 Final   Color, UA 12/26/2023 Yellow  Yellow Final   Appearance Ur 12/26/2023 Slightly cloudy  Clear Final   Leukocytes,UA 12/26/2023 1+ (A)  Negative Final   Protein,UA 12/26/2023 Negative  Negative/Trace Final   Glucose, UA 12/26/2023 Negative  Negative Final   Ketones, UA 12/26/2023 Negative  Negative Final   RBC, UA 12/26/2023 3+ (A)  Negative Final   Bilirubin, UA 12/26/2023 Negative  Negative Final   Urobilinogen, Ur 12/26/2023 0.2  0.2 - 1.0 mg/dL Final   Nitrite, UA 90/91/7974 Negative  Negative Final   Microscopic Examination 12/26/2023 See below:   Final   Microscopic was indicated and was performed.   WBC, UA 12/26/2023 6-10 (A)  0 - 5 /hpf Final   RBC, Urine 12/26/2023 >30 (A)  0 - 2 /hpf Final   Epithelial Cells (non renal) 12/26/2023 >10 (A)  0 - 10 /hpf Final   Bacteria, UA 12/26/2023 Moderate (A)  None  seen/Few Final    ECG: Date: 01/02/2024  Time ECG obtained: 1057 AM Rate: 52 bpm Rhythm: Sinus bradycardia with first-degree AV block Axis (leads I and aVF): normal Intervals: PR 216 ms. QRS 98 ms. QTc 422 ms. ST segment and T wave changes: Inferolateral T wave inversions Evidence of a possible, age undetermined, prior infarct:  Yes; anteroseptal Comparison: Similar to previous tracing obtained on 05/05/2022   IMAGING / PROCEDURES: CT HEMATURIA WORKUP performed on 10/11/2023 Diffuse urinary bladder wall thickening and mucosal hyperenhancement, somewhat right eccentric, measuring up to 1.2 cm in thickness. Findings are most consistent with nonspecific infectious or inflammatory cystitis however sessile bladder malignancy is not strictly excluded. Correlate with urinalysis. No urinary tract calculi or hydronephrosis. No renal mass or urinary tract filling defect on delayed phase imaging. Status post rectosigmoid colon resection and reanastomosis. No evidence of recurrent or metastatic disease in the abdomen or pelvis. Right lower quadrant parastomal hernia containing multiple loops of nonobstructed distal small bowel. Cardiomegaly and coronary artery disease. Aortic atherosclerosis  TRANSTHORACIC ECHOCARDIOGRAM performed on 07/05/2023 Left ventricular ejection fraction, by estimation, is 20%. The left ventricle has severely decreased function. The left ventricle demonstrates regional wall motion abnormalities (see scoring diagram/findings for description). The left ventricular internal cavity size was mildly to moderately dilated. Left ventricular  diastolic parameters are consistent with Grade I diastolic dysfunction (impaired relaxation).  Right ventricular systolic function is normal. The right ventricular size is normal.  Left atrial size was mildly dilated.  The mitral valve is normal in structure. Mild mitral valve regurgitation.  The aortic valve is normal in structure. Aortic  valve regurgitation is not visualized.   US  ARTERIAL ABI (SCREENING LOWER EXTREMITY) performed on 07/05/2023 No detectable flow in the left posterior tibial or dorsalis pedis arteries distally. ABI therefore cannot be calculated.  Moderately depressed resting ankle-brachial index of 0.53 on the right. Monophasic right posterior tibial artery waveform. No flow detectable in the right dorsalis pedis artery.  CT CHEST ABDOMEN PELVIS WO CONTRAST performed on 02/19/2022 No evidence of thoracic metastasis. Small RIGHT effusion. Cardiomegaly. Coronary artery calcification and aortic atherosclerotic calcification. No evidence of metastatic disease on noncontrast CT the abdomen pelvis. Low rectal anastomosis without complication. Anasarca of the soft tissues. No skeletal metastasis   MYOCARDIAL PERFUSION  IMAGING STUDY (LEXISCAN ) performed on 12/13/2016 Moderately decreased left ventricular systolic function with an EF of 30-44% Blood pressure demonstrated normal response to exercise There was no ST segment deviation noted during stress Medium defect of severe severity present in the apex location consistent with prior myocardial infarction Low risk and   IMPRESSION AND PLAN: [name] AUDINO [name] has been referred for pre-anesthesia review and clearance prior to him undergoing the planned anesthetic and procedural courses. Available labs, pertinent testing, and imaging results were personally reviewed by me in preparation for upcoming operative/procedural course. [facility] medical record has been updated following extensive record review and patient interview with PAT staff.   This patient has been appropriately cleared by cardiology with an overall LOW risk of patient experiencing significant perioperative cardiovascular complications. Based on clinical review performed today (01/03/24), barring any significant acute changes in the patient's overall condition, it is anticipated that he will be  able to proceed with the planned surgical intervention. Any acute changes in clinical condition may necessitate his procedure being postponed and/or cancelled. Patient will meet with anesthesia team (MD and/or CRNA) on the day of his procedure for preoperative evaluation/assessment. Questions regarding anesthetic course will be fielded at that time.   Pre-surgical instructions were reviewed with the patient during his PAT appointment, and questions were fielded to satisfaction by PAT clinical staff. He has been instructed on which medications that he will need to hold prior to surgery, as well as the ones that have been deemed safe/appropriate to take on the day of his procedure. As part of the general education provided by PAT, patient made aware both verbally and in writing, that he would need to abstain from the use of any illegal substances during his perioperative course. He was advised that failure to follow the provided instructions could necessitate case cancellation or result in serious perioperative complications up to and including death. Patient encouraged to contact PAT and/or his surgeon's office to discuss any questions or concerns that may arise prior to surgery; verbalized understanding.   Dorise Pereyra, MSN, APRN, FNP-C, CEN [facility]  Perioperative Services Nurse Practitioner Phone: [phone] Fax: [phone] 01/03/24 9:53 AM  NOTE: This note has been prepared using Dragon dictation software. Despite my best ability to proofread, there is always the potential that unintentional transcriptional errors may still occur from this process.

## 2024-01-03 NOTE — Telephone Encounter (Signed)
 Received clearance back from Dr. Florencio. Patient has been holding Plavix  for 5 days prior to procedure- have confirmed with Patients wife [name] [name].

## 2024-01-05 ENCOUNTER — Ambulatory Visit
Admission: RE | Admit: 2024-01-05 | Discharge: 2024-01-05 | Disposition: A | Discharge status: Home / Self Care | Attending: Urology | Admitting: Urology

## 2024-01-05 ENCOUNTER — Ambulatory Visit

## 2024-01-05 ENCOUNTER — Ambulatory Visit: Payer: Self-pay | Admitting: Urgent Care

## 2024-01-05 ENCOUNTER — Encounter
Admission: RE | Disposition: A | Discharge status: Home / Self Care | Payer: Self-pay | Source: Home / Self Care | Attending: Urology

## 2024-01-05 ENCOUNTER — Encounter: Payer: Self-pay | Admitting: Urology

## 2024-01-05 ENCOUNTER — Other Ambulatory Visit: Payer: Self-pay

## 2024-01-05 DIAGNOSIS — R3129 Other microscopic hematuria: Secondary | ICD-10-CM

## 2024-01-05 DIAGNOSIS — Z7901 Long term (current) use of anticoagulants: Secondary | ICD-10-CM | POA: Insufficient documentation

## 2024-01-05 DIAGNOSIS — Z955 Presence of coronary angioplasty implant and graft: Secondary | ICD-10-CM | POA: Diagnosis not present

## 2024-01-05 DIAGNOSIS — Z8249 Family history of ischemic heart disease and other diseases of the circulatory system: Secondary | ICD-10-CM | POA: Insufficient documentation

## 2024-01-05 DIAGNOSIS — I2510 Atherosclerotic heart disease of native coronary artery without angina pectoris: Secondary | ICD-10-CM | POA: Diagnosis not present

## 2024-01-05 DIAGNOSIS — E1122 Type 2 diabetes mellitus with diabetic chronic kidney disease: Secondary | ICD-10-CM | POA: Insufficient documentation

## 2024-01-05 DIAGNOSIS — I5022 Chronic systolic (congestive) heart failure: Secondary | ICD-10-CM | POA: Insufficient documentation

## 2024-01-05 DIAGNOSIS — N183 Chronic kidney disease, stage 3 unspecified: Secondary | ICD-10-CM | POA: Diagnosis not present

## 2024-01-05 DIAGNOSIS — K219 Gastro-esophageal reflux disease without esophagitis: Secondary | ICD-10-CM | POA: Diagnosis not present

## 2024-01-05 DIAGNOSIS — I5023 Acute on chronic systolic (congestive) heart failure: Secondary | ICD-10-CM

## 2024-01-05 DIAGNOSIS — N35912 Unspecified bulbous urethral stricture, male: Secondary | ICD-10-CM

## 2024-01-05 DIAGNOSIS — Z7902 Long term (current) use of antithrombotics/antiplatelets: Secondary | ICD-10-CM | POA: Insufficient documentation

## 2024-01-05 DIAGNOSIS — I252 Old myocardial infarction: Secondary | ICD-10-CM | POA: Insufficient documentation

## 2024-01-05 DIAGNOSIS — E1151 Type 2 diabetes mellitus with diabetic peripheral angiopathy without gangrene: Secondary | ICD-10-CM | POA: Diagnosis not present

## 2024-01-05 DIAGNOSIS — Z85048 Personal history of other malignant neoplasm of rectum, rectosigmoid junction, and anus: Secondary | ICD-10-CM | POA: Diagnosis not present

## 2024-01-05 DIAGNOSIS — I130 Hypertensive heart and chronic kidney disease with heart failure and stage 1 through stage 4 chronic kidney disease, or unspecified chronic kidney disease: Secondary | ICD-10-CM | POA: Insufficient documentation

## 2024-01-05 DIAGNOSIS — Z833 Family history of diabetes mellitus: Secondary | ICD-10-CM | POA: Diagnosis not present

## 2024-01-05 DIAGNOSIS — Z87891 Personal history of nicotine dependence: Secondary | ICD-10-CM | POA: Insufficient documentation

## 2024-01-05 DIAGNOSIS — I255 Ischemic cardiomyopathy: Secondary | ICD-10-CM | POA: Diagnosis not present

## 2024-01-05 DIAGNOSIS — M1990 Unspecified osteoarthritis, unspecified site: Secondary | ICD-10-CM | POA: Diagnosis not present

## 2024-01-05 DIAGNOSIS — I700 Atherosclerosis of aorta: Secondary | ICD-10-CM | POA: Insufficient documentation

## 2024-01-05 DIAGNOSIS — Z7982 Long term (current) use of aspirin: Secondary | ICD-10-CM | POA: Insufficient documentation

## 2024-01-05 DIAGNOSIS — E119 Type 2 diabetes mellitus without complications: Secondary | ICD-10-CM

## 2024-01-05 DIAGNOSIS — Z01810 Encounter for preprocedural cardiovascular examination: Secondary | ICD-10-CM

## 2024-01-05 DIAGNOSIS — N35919 Unspecified urethral stricture, male, unspecified site: Secondary | ICD-10-CM | POA: Diagnosis present

## 2024-01-05 DIAGNOSIS — I10 Essential (primary) hypertension: Secondary | ICD-10-CM

## 2024-01-05 HISTORY — DX: Long term (current) use of aspirin: Z79.82

## 2024-01-05 HISTORY — DX: Type 2 diabetes mellitus without complications: E11.9

## 2024-01-05 HISTORY — DX: Chronic kidney disease, stage 3 unspecified: N18.30

## 2024-01-05 HISTORY — PX: CYSTOURETHROSCOPY, W/ URETHRAL STRICTURE DILATION USING DRUG-COATED BALLOON: SHX7696

## 2024-01-05 HISTORY — DX: Ischemic cardiomyopathy: I25.5

## 2024-01-05 HISTORY — DX: Long term (current) use of antithrombotics/antiplatelets: Z79.02

## 2024-01-05 HISTORY — DX: Parastomal hernia without obstruction or gangrene: K43.5

## 2024-01-05 HISTORY — DX: Unspecified systolic (congestive) heart failure: I50.20

## 2024-01-05 HISTORY — DX: Cardiomegaly: I51.7

## 2024-01-05 HISTORY — DX: Atherosclerosis of aorta: I70.0

## 2024-01-05 HISTORY — PX: CYSTOSCOPY WITH RETROGRADE URETHROGRAM: SHX6309

## 2024-01-05 HISTORY — DX: Calculus of kidney: N20.0

## 2024-01-05 HISTORY — DX: Dyspnea, unspecified: R06.00

## 2024-01-05 LAB — GLUCOSE, CAPILLARY: Glucose-Capillary: 92 mg/dL (ref 70–99)

## 2024-01-05 SURGERY — CYSTOURETHROSCOPY, WITH URETHRAL STRICTURE DILATION USING DRUG-COATED BALLOON
Anesthesia: General | Site: Urethra

## 2024-01-05 MED ORDER — CEFAZOLIN SODIUM-DEXTROSE 2-4 GM/100ML-% IV SOLN
2.0000 g | INTRAVENOUS | Status: AC
  Administered 2024-01-05: 2 g via INTRAVENOUS

## 2024-01-05 MED ORDER — IOHEXOL 180 MG/ML  SOLN
INTRAMUSCULAR | Status: DC | PRN
  Administered 2024-01-05: 20 mL

## 2024-01-05 MED ORDER — PROPOFOL 1000 MG/100ML IV EMUL
INTRAVENOUS | Status: AC
  Filled 2024-01-05: qty 100

## 2024-01-05 MED ORDER — DROPERIDOL 2.5 MG/ML IJ SOLN: 0.6250 mg | Freq: Once | INTRAMUSCULAR | Status: DC | PRN

## 2024-01-05 MED ORDER — PROPOFOL 500 MG/50ML IV EMUL
INTRAVENOUS | Status: DC | PRN
  Administered 2024-01-05: 75 ug/kg/min via INTRAVENOUS

## 2024-01-05 MED ORDER — FENTANYL CITRATE (PF) 100 MCG/2ML IJ SOLN
INTRAMUSCULAR | Status: AC
  Filled 2024-01-05: qty 2

## 2024-01-05 MED ORDER — CEFUROXIME AXETIL 250 MG PO TABS: 250.0000 mg | ORAL_TABLET | Freq: Two times a day (BID) | ORAL | 0 refills | Status: DC

## 2024-01-05 MED ORDER — CHLORHEXIDINE GLUCONATE 0.12 % MT SOLN
OROMUCOSAL | Status: AC
Start: 2024-01-05 — End: 2024-01-05
  Filled 2024-01-05: qty 15

## 2024-01-05 MED ORDER — OXYCODONE HCL 5 MG PO TABS
ORAL_TABLET | ORAL | Status: AC
  Filled 2024-01-05: qty 1

## 2024-01-05 MED ORDER — MIDAZOLAM HCL 5 MG/5ML IJ SOLN
INTRAMUSCULAR | Status: DC | PRN
Start: 2024-01-05 — End: 2024-01-05
  Administered 2024-01-05: 1 mg via INTRAVENOUS

## 2024-01-05 MED ORDER — ACETAMINOPHEN 10 MG/ML IV SOLN
INTRAVENOUS | Status: AC
  Filled 2024-01-05: qty 200

## 2024-01-05 MED ORDER — OXYCODONE HCL 5 MG PO TABS
5.0000 mg | ORAL_TABLET | Freq: Once | ORAL | Status: AC
  Administered 2024-01-05: 5 mg via ORAL

## 2024-01-05 MED ORDER — MIDAZOLAM HCL 2 MG/2ML IJ SOLN
INTRAMUSCULAR | Status: AC
  Filled 2024-01-05: qty 2

## 2024-01-05 MED ORDER — LIDOCAINE HCL (CARDIAC) PF 100 MG/5ML IV SOSY
PREFILLED_SYRINGE | INTRAVENOUS | Status: DC | PRN
Start: 2024-01-05 — End: 2024-01-05
  Administered 2024-01-05: 40 mg via INTRAVENOUS

## 2024-01-05 MED ORDER — CEFAZOLIN SODIUM-DEXTROSE 2-4 GM/100ML-% IV SOLN
INTRAVENOUS | Status: AC
  Filled 2024-01-05: qty 100

## 2024-01-05 MED ORDER — CHLORHEXIDINE GLUCONATE 0.12 % MT SOLN
15.0000 mL | Freq: Once | OROMUCOSAL | Status: AC
  Administered 2024-01-05: 15 mL via OROMUCOSAL

## 2024-01-05 MED ORDER — ACETAMINOPHEN 10 MG/ML IV SOLN
INTRAVENOUS | Status: DC | PRN
  Administered 2024-01-05: 1000 mg via INTRAVENOUS

## 2024-01-05 MED ORDER — PROPOFOL 10 MG/ML IV BOLUS
INTRAVENOUS | Status: DC | PRN
  Administered 2024-01-05: 30 mg via INTRAVENOUS
  Administered 2024-01-05 (×3): 20 mg via INTRAVENOUS

## 2024-01-05 MED ORDER — FENTANYL CITRATE (PF) 100 MCG/2ML IJ SOLN: 25.0000 ug | INTRAMUSCULAR | Status: DC | PRN

## 2024-01-05 MED ORDER — ONDANSETRON HCL 4 MG/2ML IJ SOLN
INTRAMUSCULAR | Status: DC | PRN
Start: 2024-01-05 — End: 2024-01-05
  Administered 2024-01-05: 4 mg via INTRAVENOUS

## 2024-01-05 MED ORDER — ORAL CARE MOUTH RINSE: 15.0000 mL | Freq: Once | OROMUCOSAL | Status: AC

## 2024-01-05 MED ORDER — SODIUM CHLORIDE 0.9 % IV SOLN: INTRAVENOUS | Status: DC

## 2024-01-05 MED ORDER — FENTANYL CITRATE (PF) 100 MCG/2ML IJ SOLN
INTRAMUSCULAR | Status: DC | PRN
  Administered 2024-01-05 (×4): 25 ug via INTRAVENOUS

## 2024-01-05 MED ORDER — EPHEDRINE SULFATE-NACL 50-0.9 MG/10ML-% IV SOSY
PREFILLED_SYRINGE | INTRAVENOUS | Status: DC | PRN
  Administered 2024-01-05: 5 mg via INTRAVENOUS

## 2024-01-05 MED ORDER — EPHEDRINE 5 MG/ML INJ
INTRAVENOUS | Status: AC
  Filled 2024-01-05: qty 5

## 2024-01-05 MED ORDER — STERILE WATER FOR IRRIGATION IR SOLN
Status: DC | PRN
Start: 2024-01-05 — End: 2024-01-05
  Administered 2024-01-05: 500 mL

## 2024-01-05 SURGICAL SUPPLY — 33 items
BAG DRAIN SIEMENS DORNER NS (MISCELLANEOUS) ×2 IMPLANT
BAG URINE DRAIN 2000ML AR STRL (UROLOGICAL SUPPLIES) ×2 IMPLANT
BALLOON OPTILUME DCB 24X5X75 (BALLOONS) IMPLANT
BALLOON OPTILUME DCB 30X3X75 (BALLOONS) ×1 IMPLANT
BALLOON OPTILUME DCB 30X5X75 (BALLOONS) IMPLANT
BRUSH SCRUB EZ 1% IODOPHOR (MISCELLANEOUS) ×2 IMPLANT
CATH FOL 2WAY LX 16X5 (CATHETERS) ×2 IMPLANT
CATH FOL 2WAY LX 18X30 (CATHETERS) ×2 IMPLANT
CATH FOLEY 2W COUNCIL 20FR 5CC (CATHETERS) ×1 IMPLANT
CATH SET URETHRAL DILATOR (CATHETERS) ×2 IMPLANT
DRAPE UTILITY 15X26 TOWEL STRL (DRAPES) ×2 IMPLANT
DRSG TELFA 3X4 N-ADH STERILE (GAUZE/BANDAGES/DRESSINGS) ×2 IMPLANT
ELECTRODE LOOP 22F BIPOLAR SML (ELECTROSURGICAL) IMPLANT
ELECTRODE REM PT RTRN 9FT ADLT (ELECTROSURGICAL) ×2 IMPLANT
GLOVE BIOGEL PI IND STRL 7.5 (GLOVE) ×2 IMPLANT
GOWN STRL REUS W/ TWL LRG LVL3 (GOWN DISPOSABLE) ×4 IMPLANT
GOWN STRL REUS W/ TWL XL LVL3 (GOWN DISPOSABLE) ×4 IMPLANT
GOWN STRL REUS W/TWL XL LVL4 (GOWN DISPOSABLE) ×2 IMPLANT
GUIDEWIRE STR DUAL SENSOR (WIRE) ×2 IMPLANT
HOLDER FOLEY CATH W/STRAP (MISCELLANEOUS) ×2 IMPLANT
KIT TURNOVER CYSTO (KITS) ×2 IMPLANT
LOOP CUT BIPOLAR 24F LRG (ELECTROSURGICAL) IMPLANT
NDL SAFETY ECLIPSE 18X1.5 (NEEDLE) ×2 IMPLANT
PACK CYSTO AR (MISCELLANEOUS) ×2 IMPLANT
SET CYSTO W/LG BORE CLAMP LF (SET/KITS/TRAYS/PACK) ×2 IMPLANT
SET IRRIG Y TYPE TUR BLADDER L (SET/KITS/TRAYS/PACK) ×2 IMPLANT
SOL .9 NS 3000ML IRR UROMATIC (IV SOLUTION) ×3 IMPLANT
SURGILUBE 2OZ TUBE FLIPTOP (MISCELLANEOUS) ×2 IMPLANT
SYR 30ML LL (SYRINGE) ×2 IMPLANT
SYRINGE TOOMEY IRRIG 70ML (MISCELLANEOUS) ×2 IMPLANT
WATER STERILE IRR 1000ML POUR (IV SOLUTION) ×2 IMPLANT
WATER STERILE IRR 3000ML UROMA (IV SOLUTION) ×2 IMPLANT
WATER STERILE IRR 500ML POUR (IV SOLUTION) ×2 IMPLANT

## 2024-01-05 NOTE — Interval H&P Note (Signed)
 History and Physical Interval Note:  01/05/2024 12:10 PM  [name] ONEIDA Kirks [name]  has presented today for surgery, with the diagnosis of Microhematuria, Urethral Stricture.  The various methods of treatment have been discussed with the patient and family. After consideration of risks, benefits and other options for treatment, the patient has consented to  Procedure(s): CYSTOURETHROSCOPY, WITH URETHRAL STRICTURE DILATION USING DRUG-COATED BALLOON (N/A) CYSTOSCOPY, WITH BIOPSY (N/A) TURBT (TRANSURETHRAL RESECTION OF BLADDER TUMOR) (N/A) as a surgical intervention.  The patient's history has been reviewed, patient examined, no change in status, stable for surgery.  I have reviewed the patient's chart and labs.  Questions were answered to the patient's satisfaction.    CV:RRR Lungs:clear  [name] [name]

## 2024-01-05 NOTE — Transfer of Care (Signed)
 Immediate Anesthesia Transfer of Care Note  Patient: [name] MCGLINCHEY [name]  Procedure(s) Performed: CYSTOURETHROSCOPY, WITH URETHRAL STRICTURE DILATION USING DRUG-COATED BALLOON (Urethra) CYSTOSCOPY WITH RETROGRADE URETHROGRAM (Urethra)  Patient Location: PACU  Anesthesia Type:MAC  Level of Consciousness: awake, drowsy, and patient cooperative  Airway & Oxygen Therapy: Patient Spontanous Breathing and Patient connected to face mask oxygen  Post-op Assessment: Report given to RN and Post -op Vital signs reviewed and stable  Post vital signs: Reviewed and stable  Last Vitals:  Vitals Value Taken Time  BP 125/72 01/05/24 13:06  Temp 97.7   Pulse 51 01/05/24 13:08  Resp 22 01/05/24 13:08  SpO2 100 % 01/05/24 13:08  Vitals shown include unfiled device data.  Last Pain:  Vitals:   01/05/24 1106  TempSrc: Temporal         Complications: No notable events documented.

## 2024-01-05 NOTE — Anesthesia Preprocedure Evaluation (Signed)
 Anesthesia Evaluation  Patient identified by MRN, date of birth, ID band Patient awake    Reviewed: Allergy & Precautions, H&P , NPO status , Patient's Chart, lab work & pertinent test results  History of Anesthesia Complications Negative for: history of anesthetic complications  Airway Mallampati: II  TM Distance: >3 FB Neck ROM: full    Dental  (+) Missing, Dental Advidsory Given, Poor Dentition   Pulmonary neg shortness of breath, neg COPD, neg recent URI, former smoker   Pulmonary exam normal        Cardiovascular hypertension, (-) angina + CAD (cath done (PCI/ stent) 2005 & cath done 2016), + Past MI (2005), + Cardiac Stents (2005), + Peripheral Vascular Disease and +CHF (HFrEF EF  < 20%)  Normal cardiovascular exam  ECHO 07/05/2023:  1. Left ventricular ejection fraction, by estimation, is 20%. The left  ventricle has severely decreased function. The left ventricle demonstrates  regional wall motion abnormalities (see scoring diagram/findings for  description). The left ventricular  internal cavity size was mildly to moderately dilated. Left ventricular  diastolic parameters are consistent with Grade I diastolic dysfunction  (impaired relaxation).   2. Right ventricular systolic function is normal. The right ventricular  size is normal.   3. Left atrial size was mildly dilated.   4. The mitral valve is normal in structure. Mild mitral valve  regurgitation.   5. The aortic valve is normal in structure. Aortic valve regurgitation is  not visualized.     Neuro/Psych negative neurological ROS  negative psych ROS   GI/Hepatic Neg liver ROS,GERD  ,,  Endo/Other  diabetes, Type 2    Renal/GU Renal disease  negative genitourinary   Musculoskeletal   Abdominal   Peds  Hematology  (+) Blood dyscrasia, anemia   Anesthesia Other Findings Left Foot osteomyelitis and gangrene of second, third and fourth toes -Plan  TMA  -Severe PAD s/p LLE Bypass done on 07/11/2023. Pt had a difficult foley placed for which urology was consulted.  Plan: Patient will need Foley catheter for at least a week.  Will plan for outpatient Foley removal.    Past Medical History: History of rectal cancer No date: Atherosclerosis No date: Cancer (HCC) No date: CHF (congestive heart failure) (HCC) No date: Chronic kidney disease     Comment:  kidney stones, UTI No date: Colon cancer (HCC) No date: Coronary artery disease No date: Diabetes mellitus without complication (HCC) No date: GERD (gastroesophageal reflux disease) No date: Gout No date: Hematuria No date: Hyperlipidemia No date: Hypertension No date: Iron deficiency anemia 2005: Myocardial infarction (HCC) No date: Peripheral vascular disease (HCC) No date: Pulmonary nodules No date: Rectal cancer (HCC) No date: Ureter, stricture No date: Wears dentures     Comment:  full upper  Past Surgical History: 10/23/2014: BOWEL RESECTION; N/A     Comment:  Procedure: LOW ANTERIOR BOWEL RESECTION;  Surgeon: Oneil Chang, MD;  Location: [facility];  Service: General;                Laterality: N/A; No date: COLON SURGERY No date: COLONOSCOPY 06/06/14 No date: COLONOSCOPY WITH ESOPHAGOGASTRODUODENOSCOPY (EGD) 03/2004: CORONARY ANGIOPLASTY WITH STENT PLACEMENT 10/23/2014: CYSTOSCOPY WITH STENT PLACEMENT; Bilateral     Comment:  Procedure: CYSTOSCOPY WITH STENT PLACEMENT,URETHRAL               DILATION, LEFT RETROGRADE PYELOGRAM, URETEROSCOPY;  Surgeon: Rosina Riis, MD;  Location: [facility];                Service: Urology;  Laterality: Bilateral; 10/23/2014: DIVERTING ILEOSTOMY; N/A     Comment:  Procedure: DIVERTING ILEOSTOMY;  Surgeon: Oneil Chang, MD;              Location: [facility];  Service: General;  Laterality: N/A; 07/11/2023: FEMORAL-TIBIAL BYPASS GRAFT; Left     Comment:  Procedure: CREATION, BYPASS, ARTERIAL, FEMORAL TO                TIBIAL, USING GRAFT, INSITU;  Surgeon: Marea Selinda RAMAN, MD;               Location: [facility];  Service: Vascular;  Laterality:               Left; 09/08/2015: ILEOSTOMY CLOSURE; N/A     Comment:  Procedure: ILEOSTOMY TAKEDOWN;  Surgeon: Carlin Pastel, MD;  Location: [facility];  Service: General;                Laterality: N/A; 10/23/2014: LAPAROTOMY; N/A     Comment:  Procedure: EXPLORATORY LAPAROTOMY;  Surgeon: Oneil Chang,               MD;  Location: [facility];  Service: General;  Laterality:               N/A; 07/06/2023: LOWER EXTREMITY ANGIOGRAPHY; Left     Comment:  Procedure: Lower Extremity Angiography;  Surgeon: Marea Selinda RAMAN, MD;  Location: [facility];  Service:               Cardiovascular;  Laterality: Left; 05/12/2015: PERIPHERAL VASCULAR CATHETERIZATION; N/A     Comment:  Procedure: Abdominal Aortogram w/Lower Extremity;                Surgeon: Selinda RAMAN Marea, MD;  Location: [facility];  Service: Cardiovascular;  Laterality: N/A; 05/12/2015: PERIPHERAL VASCULAR CATHETERIZATION     Comment:  Procedure: Lower Extremity Intervention;  Surgeon: Selinda RAMAN Marea, MD;  Location: [facility];  Service:               Cardiovascular;; 06/30/2015: PERIPHERAL VASCULAR CATHETERIZATION; N/A     Comment:  Procedure: Abdominal Aortogram w/Lower Extremity;                Surgeon: Selinda RAMAN Marea, MD;  Location: [facility];  Service: Cardiovascular;  Laterality: N/A; 06/30/2015: PERIPHERAL VASCULAR CATHETERIZATION     Comment:  Procedure: Lower Extremity Intervention;  Surgeon: Selinda RAMAN Marea, MD;  Location: [facility];  Service:               Cardiovascular;; 09/08/2015: PORT-A-CATH REMOVAL; Right     Comment:  Procedure: REMOVAL PORT-A-CATH;  Surgeon: Carlin Pastel, MD;  Location: [facility];  Service: General;  Laterality: Right; 07/01/2014: PORTACATH  PLACEMENT     Comment:  Dr. Eluterio  BMI    Body Mass Index: 24.81 kg/m      Reproductive/Obstetrics negative OB ROS                              Anesthesia Physical Anesthesia Plan  ASA: 4  Anesthesia Plan: General   Post-op Pain Management:    Induction: Intravenous  PONV Risk Score and Plan: Ondansetron , Dexamethasone  and Treatment may vary due to age or medical condition  Airway Management Planned: LMA  Additional Equipment:   Intra-op Plan:   Post-operative Plan: Extubation in OR  Informed Consent: I have reviewed the patients History and Physical, chart, labs and discussed the procedure including the risks, benefits and alternatives for the proposed anesthesia with the patient or authorized representative who has indicated his/her understanding and acceptance.     Dental Advisory Given  Plan Discussed with: CRNA and Surgeon  Anesthesia Plan Comments:          Anesthesia Quick Evaluation

## 2024-01-05 NOTE — Discharge Instructions (Addendum)
 Cystoscopy patient instructions  Following a cystoscopy, a catheter (a flexible rubber tube) is sometimes left in place to empty the bladder. This may cause some discomfort or a feeling that you need to urinate. Your doctor determines the period of time that the catheter will be left in place. You may have bloody urine for two to three days (Call your doctor if the amount of bleeding increases or does not subside).  You may pass blood clots in your urine, especially if you had a biopsy. It is not unusual to pass small blood clots and have some bloody urine a couple of weeks after your cystoscopy. Again, call your doctor if the bleeding does not subside. You may have: Dysuria (painful urination) Frequency (urinating often) Urgency (strong desire to urinate)  These symptoms are common especially if medicine is instilled into the bladder or a ureteral stent is placed. Avoiding alcohol and caffeine, such as coffee, tea, and chocolate, may help relieve these symptoms. Drink plenty of water , unless otherwise instructed. Your doctor may also prescribe an antibiotic or other medicine to reduce these symptoms.  Cystoscopy results are available soon after the procedure; biopsy results usually take two to four days. Your doctor will discuss the results of your exam with you. Before you go home, you will be given specific instructions for follow-up care. Special Instructions:   If you are going home with a catheter in place do not take a tub bath until removed by your doctor.   You may resume your normal activities.   Do not drive or operate machinery if you are taking narcotic pain medicine.  You are scheduled for a follow-up appointment 01/10/2024 at 11 AM for catheter removal  Call Your Doctor If: The catheter is not draining You have severe pain You are unable to urinate You have a fever over 101 You have severe bleeding

## 2024-01-05 NOTE — Op Note (Signed)
   Preoperative diagnosis:  Urethral stricture  Postoperative diagnosis:  Urethra stricture  Procedure: Balloon dilation urethral stricture (Optilume DBC) Cystoscopy Retrograde urethrogram  Surgeon: Glendia JAYSON Barba, MD  Anesthesia: MAC  Complications: None  Intraoperative findings:  1 cm bulbar urethral stricture Nonocclusive prostate with open bladder neck Trabeculated bladder without mucosal erythema, solid or papillary lesions.  UOs normal-appearing bilaterally  EBL: Minimal  Specimens: None  Indication: [name] LABRADA [name] is a 68 y.o. male with a history of bulbar urethral stricture which was dilated for Foley catheter placement intraoperatively prior to vascular surgery.  Recently found to have microhematuria and office cystoscopy remarkable for a recurrent bulbar urethral stricture.  After reviewing the management options for treatment, he elected to proceed with the above surgical procedure(s). We have discussed the potential benefits and risks of the procedure, side effects of the proposed treatment, the likelihood of the patient achieving the goals of the procedure, and any potential problems that might occur during the procedure or recuperation. Informed consent has been obtained.  Description of procedure:  The patient was taken to the operating room and deep sedation was obtained by anesthesia. A preoperative time-out was performed.   His left hip was bumped up on a pillow.  A Toomey syringe with adapter and Omnipaque  contrast was inserted per urethra and contrast was instilled under fluoroscopy which identified a bulbar urethral stricture ~1 cm. The patient was then placed in the dorsal lithotomy position, prepped and draped in the usual sterile fashion, and preoperative antibiotics were administered.  A 21 French cystoscope with 30 degree lens was lubricated, inserted per urethra and advanced proximally under direct vision until the stricture was identified. .  A  0.038 Sensor wire was advanced through the cystoscope and through the stricture into the bladder.  The cystoscope was removed and the stricture was dilated with over-the-wire disposable dilators from 31F-22F.  The cystoscope was then repassed alongside the guidewire into the bladder and panendoscopy was performed with findings as described above. The guidewire was removed and repassed through the cystoscope into the bladder.  The scope was backed out just distal to the stricture.  A 22F/30 mm Optilume catheter was then advanced over the wire and positioned through the stricture.  The balloon was inflated to 10 atm and under fluoroscopy the balloon was fully inflated and no waist was noted.  The balloon was kept inflated for 5 minutes.  No irrigant was used through the cystoscope during this portion of the procedure and for the remainder of the procedure.  The balloon was then deflated and the balloon catheter was removed.  A 77F Council catheter was then advanced over the wire without difficulty.  The balloon was inflated with 10 mL of sterile water  and placed to gravity drainage.  The patient was then transported to the PACU in stable condition.  Plan: The catheter will be removed in 48 hours Postop follow-up 1 month    Glendia JAYSON Barba, M.D.

## 2024-01-06 ENCOUNTER — Encounter: Payer: Self-pay | Admitting: Urology

## 2024-01-09 NOTE — Progress Notes (Unsigned)
 01/10/2024 12:22 PM   [name] ONEIDA Kirks [name] [birth year] [id number]  Referring provider: No referring provider defined for this encounter.  Urological history: 1.  Urethral stricture disease - bulbar urethral stricture dilation during stent placement for bowel resection (2016) - Bulbar urethral stricture dilation for difficult Foley catheter placement (06/2023)  - Balloon dilation the urethral stricture (Optilume DBC) (12/2023)   2.  High risk hematuria - Former smoker - CTU (09/2023) Simple, benign bilateral renal cortical cysts, for which no further follow-up or characterization is required. Diffuse urinary bladder wall thickening and mucosal hyperenhancement, somewhat right eccentric, measuring up to 1.2 cm in thickness - cysto (12/2023) 1 cn bulbar urethral stricture, nonocclusive prostate with open bladder neck, trabeculated bladder without mucosal erythema, solid papillary lesions.  UOs normal-appearing bilaterally  3. BPH with LU TS - PSA (2020) 0.33  No chief complaint on file.  HPI: [name] TREICHLER [name] is a 68 y.o. man who presents today for Foley catheter removal after undergoing Optilume balloon dilation on September 18th with Dr. Twylla.  Previous records reviewed.  His postprocedural course was as expected and uneventful.        PMH: Past Medical History:  Diagnosis Date   Aortic atherosclerosis (HCC)    Arthritis    Cardiomegaly    CKD (chronic kidney disease), stage III (HCC)    Coronary artery disease    Dyspnea    GERD (gastroesophageal reflux disease)    Gout    HFrEF (heart failure with reduced ejection fraction) (HCC)    Hyperlipidemia    Hypertension    Iron deficiency anemia    Ischemic cardiomyopathy    Long term current use of clopidogrel     Long-term use of aspirin  therapy    Microhematuria    Nephrolithiasis    NSTEMI (non-ST elevated myocardial infarction) (HCC) 04/14/2004   a.) LHC/PCI 04/16/2004: 80% pLAD (3.0 x 13 mm  Cypher), 95% dLAD (2.5 x 18 mm Cypher)   Parastomal hernia (RLQ)    Peripheral vascular disease (HCC)    a.) s/p LEFT fem-tib bypass 07/11/2023   Pulmonary nodules    Rectal cancer (HCC)    a.) stage IIA rectal cancer s/p neoadjuvant chemotherapy + XRT + surgical resection (LAR)   T2DM (type 2 diabetes mellitus) (HCC)    Urethral stricture (recurrent)    Wears dentures    full upper    Surgical History: Past Surgical History:  Procedure Laterality Date   BOWEL RESECTION N/A 10/23/2014   Procedure: LOW ANTERIOR BOWEL RESECTION;  Surgeon: Oneil Chang, MD;  Location: [facility];  Service: General;  Laterality: N/A;   COLONOSCOPY N/A 06/17/2014   COLONOSCOPY WITH ESOPHAGOGASTRODUODENOSCOPY (EGD)     CORONARY ANGIOPLASTY WITH STENT PLACEMENT Left 04/16/2004   Procedure: CORONARY ANGIOPLASTY WITH STENT PLACEMENT; Location: [facility]; Surgeon: Margie Lovelace, MD   CYSTOSCOPY WITH RETROGRADE URETHROGRAM N/A 01/05/2024   Procedure: CYSTOSCOPY WITH RETROGRADE URETHROGRAM;  Surgeon: Twylla Glendia BROCKS, MD;  Location: [facility];  Service: Urology;  Laterality: N/A;   CYSTOSCOPY WITH STENT PLACEMENT Bilateral 10/23/2014   Procedure: CYSTOSCOPY WITH STENT PLACEMENT,URETHRAL DILATION, LEFT RETROGRADE PYELOGRAM, URETEROSCOPY;  Surgeon: Rosina Riis, MD;  Location: [facility];  Service: Urology;  Laterality: Bilateral;   CYSTOURETHROSCOPY, W/ URETHRAL STRICTURE DILATION USING DRUG-COATED BALLOON N/A 01/05/2024   Procedure: CYSTOURETHROSCOPY, WITH URETHRAL STRICTURE DILATION USING DRUG-COATED BALLOON;  Surgeon: Twylla Glendia BROCKS, MD;  Location: [facility];  Service: Urology;  Laterality: N/A;   DIVERTING ILEOSTOMY N/A 10/23/2014   Procedure: DIVERTING  ILEOSTOMY;  Surgeon: Oneil Chang, MD;  Location: [facility];  Service: General;  Laterality: N/A;   FEMORAL-TIBIAL BYPASS GRAFT Left 07/11/2023   Procedure: CREATION, BYPASS, ARTERIAL, FEMORAL TO TIBIAL, USING GRAFT, INSITU;  Surgeon: Marea Selinda RAMAN, MD;  Location: [facility];   Service: Vascular;  Laterality: Left;   ILEOSTOMY CLOSURE N/A 09/08/2015   Procedure: ILEOSTOMY TAKEDOWN;  Surgeon: Carlin Pastel, MD;  Location: [facility];  Service: General;  Laterality: N/A;   LAPAROTOMY N/A 10/23/2014   Procedure: EXPLORATORY LAPAROTOMY;  Surgeon: Oneil Chang, MD;  Location: [facility];  Service: General;  Laterality: N/A;   LOWER EXTREMITY ANGIOGRAPHY Left 07/06/2023   Procedure: Lower Extremity Angiography;  Surgeon: Marea Selinda RAMAN, MD;  Location: [facility];  Service: Cardiovascular;  Laterality: Left;   PERIPHERAL VASCULAR CATHETERIZATION N/A 05/12/2015   Procedure: Abdominal Aortogram w/Lower Extremity;  Surgeon: Selinda RAMAN Marea, MD;  Location: [facility];  Service: Cardiovascular;  Laterality: N/A;   PERIPHERAL VASCULAR CATHETERIZATION  05/12/2015   Procedure: Lower Extremity Intervention;  Surgeon: Selinda RAMAN Marea, MD;  Location: [facility];  Service: Cardiovascular;;   PERIPHERAL VASCULAR CATHETERIZATION N/A 06/30/2015   Procedure: Abdominal Aortogram w/Lower Extremity;  Surgeon: Selinda RAMAN Marea, MD;  Location: [facility];  Service: Cardiovascular;  Laterality: N/A;   PERIPHERAL VASCULAR CATHETERIZATION  06/30/2015   Procedure: Lower Extremity Intervention;  Surgeon: Selinda RAMAN Marea, MD;  Location: [facility];  Service: Cardiovascular;;   PORT-A-CATH REMOVAL Right 09/08/2015   Procedure: REMOVAL PORT-A-CATH;  Surgeon: Carlin Pastel, MD;  Location: [facility];  Service: General;  Laterality: Right;   PORTACATH PLACEMENT  07/01/2014   Dr. Chang   TRANSMETATARSAL AMPUTATION Left 07/15/2023   Procedure: AMPUTATION, FOOT, TRANSMETATARSAL;  Surgeon: Ashley Soulier, DPM;  Location: [facility];  Service: Orthopedics/Podiatry;  Laterality: Left;    Home Medications:  Allergies as of 01/10/2024       Reactions   Entresto  [sacubitril -valsartan ] Cough   Lisinopril  Cough        Medication List        Accurate as of January 09, 2024 12:22  PM. If you have any questions, ask your nurse or doctor.          acetaminophen  500 MG tablet Commonly known as: TYLENOL  Take 500-1,000 mg by mouth every 6 (six) hours as needed (pain.).   aspirin  EC 81 MG tablet Take 1 tablet (81 mg total) by mouth daily. Swallow whole. What changed: when to take this   carvedilol  3.125 MG tablet Commonly known as: COREG  Take 3.125 mg by mouth 2 (two) times daily with a meal.   cefUROXime  250 MG tablet Commonly known as: CEFTIN  Take 1 tablet (250 mg total) by mouth 2 (two) times daily with a meal for 5 days.   clopidogrel  75 MG tablet Commonly known as: PLAVIX  Take 1 tablet (75 mg total) by mouth daily.   colchicine  0.6 MG tablet Take 0.6 mg by mouth in the morning and at bedtime.   cyanocobalamin  1000 MCG tablet Commonly known as: VITAMIN B12 Take 1,000 mcg by mouth in the morning.   Dulcolax 5 MG EC tablet Generic drug: bisacodyl  Take 1 tablet (5 mg total) by mouth daily as needed for severe constipation.   Iron 325 (65 Fe) MG Tabs Take 1 tablet by mouth every other day.   polyethylene glycol 17 g packet Commonly known as: MIRALAX  / GLYCOLAX  Take 17 g by mouth daily. What changed:  when to  take this reasons to take this   sulfamethoxazole -trimethoprim  800-160 MG tablet Commonly known as: BACTRIM  DS Take 1 tablet by mouth 2 (two) times daily.   vitamin C  1000 MG tablet Take 1,000 mg by mouth in the morning. Reported on 09/26/2015        Allergies:  Allergies  Allergen Reactions   Entresto  [Sacubitril -Valsartan ] Cough   Lisinopril  Cough    Family History: Family History  Problem Relation Age of Onset   Hypertension Brother    Hypertension Father    Diabetes Father    Diabetes Brother    Hypertension Brother    Heart disease Paternal Grandfather     Social History:  reports that he quit smoking about 39 years ago. His smoking use included cigarettes. He has never used smokeless tobacco. He reports that he  does not drink alcohol and does not use drugs.  ROS: Pertinent ROS in HPI  Physical Exam: There were no vitals taken for this visit.  Constitutional:  Well nourished. Alert and oriented, No acute distress. HEENT: [place] AT, moist mucus membranes.  Trachea midline, no masses. Cardiovascular: No clubbing, cyanosis, or edema. Respiratory: Normal respiratory effort, no increased work of breathing. GI: Abdomen is soft, non tender, non distended, no abdominal masses. Liver and spleen not palpable.  No hernias appreciated.  Stool sample for occult testing is not indicated.   GU: No CVA tenderness.  No bladder fullness or masses.  Patient with circumcised/uncircumcised phallus. ***Foreskin easily retracted***  Urethral meatus is patent.  No penile discharge. No penile lesions or rashes. Scrotum without lesions, cysts, rashes and/or edema.  Testicles are located scrotally bilaterally. No masses are appreciated in the testicles. Left and right epididymis are normal. Rectal: Patient with  normal sphincter tone. Anus and perineum without scarring or rashes. No rectal masses are appreciated. Prostate is approximately *** grams, *** nodules are appreciated. Seminal vesicles are normal. Skin: No rashes, bruises or suspicious lesions. Lymph: No cervical or inguinal adenopathy. Neurologic: Grossly intact, no focal deficits, moving all 4 extremities. Psychiatric: Normal mood and affect.  Laboratory Data: Lab Results  Component Value Date   WBC 9.4 07/22/2023   HGB 7.1 (L) 07/22/2023   HCT 22.5 (L) 07/22/2023   MCV 86.2 07/22/2023   PLT 248 07/22/2023    Lab Results  Component Value Date   CREATININE 1.20 10/11/2023   Lab Results  Component Value Date   HGBA1C 6.3 (H) 07/05/2023   Urinalysis    Component Value Date/Time   COLORURINE YELLOW (A) 07/18/2023 1100   APPEARANCEUR Slightly cloudy 12/26/2023 1300   LABSPEC 1.015 07/18/2023 1100   LABSPEC 1.013 02/01/2014 1416   PHURINE 5.0 07/18/2023  1100   GLUCOSEU Negative 12/26/2023 1300   GLUCOSEU Negative 02/01/2014 1416   HGBUR LARGE (A) 07/18/2023 1100   BILIRUBINUR Negative 12/26/2023 1300   BILIRUBINUR Negative 02/01/2014 1416   KETONESUR NEGATIVE 07/18/2023 1100   PROTEINUR Negative 12/26/2023 1300   PROTEINUR 100 (A) 07/18/2023 1100   NITRITE Negative 12/26/2023 1300   NITRITE NEGATIVE 07/18/2023 1100   LEUKOCYTESUR 1+ (A) 12/26/2023 1300   LEUKOCYTESUR LARGE (A) 07/18/2023 1100   LEUKOCYTESUR Trace 02/01/2014 1416    Lab Results  Component Value Date   LABMICR See below: 12/26/2023   WBCUA 6-10 (A) 12/26/2023   RBCUA >30R 11/20/2014   LABEPIT >10 (A) 12/26/2023   MUCUS Present (A) 09/08/2023   BACTERIA Moderate (A) 12/26/2023  I have reviewed the labs.   Pertinent Imaging: N/A  Catheter Removal  Patient is present today for a catheter removal.  ***ml of water  was drained from the balloon. A ***FR foley cath was removed from the bladder, {dnt complications:20057}. Patient tolerated well.  Performed by: ***  Assessment & Plan:  ***  1. Urethral stricture disease -s/p Optilume dilation -Foley removed w/o difficulty -Explained that he could experience irritative voiding symptoms, hematuria, urinary retention for time, incontinence, blood in the semen and interference with ejaculation for a few days -reviewed return to clinic symptoms -Keep follow up with Dr. Twylla       No follow-ups on file.  These notes generated with voice recognition software. I apologize for typographical errors.  CLOTILDA HELON RIGGERS  [facility] [address]  [address] [place], [place] [address] [phone]

## 2024-01-10 ENCOUNTER — Encounter: Admitting: Urology

## 2024-01-10 ENCOUNTER — Ambulatory Visit (INDEPENDENT_AMBULATORY_CARE_PROVIDER_SITE_OTHER): Admitting: Urology

## 2024-01-10 ENCOUNTER — Encounter: Payer: Self-pay | Admitting: Urology

## 2024-01-10 VITALS — BP 157/91 | HR 57 | Ht 68.0 in | Wt 173.8 lb

## 2024-01-10 DIAGNOSIS — N35912 Unspecified bulbous urethral stricture, male: Secondary | ICD-10-CM

## 2024-01-10 LAB — BLADDER SCAN AMB NON-IMAGING

## 2024-01-18 NOTE — Anesthesia Postprocedure Evaluation (Signed)
 Anesthesia Post Note  Patient: [name] KALMAN [name]  Procedure(s) Performed: CYSTOURETHROSCOPY, WITH URETHRAL STRICTURE DILATION USING DRUG-COATED BALLOON (Urethra) CYSTOSCOPY WITH RETROGRADE URETHROGRAM (Urethra)  Patient location during evaluation: PACU Anesthesia Type: General Level of consciousness: awake and alert Pain management: pain level controlled Vital Signs Assessment: post-procedure vital signs reviewed and stable Respiratory status: spontaneous breathing, nonlabored ventilation, respiratory function stable and patient connected to nasal cannula oxygen Cardiovascular status: blood pressure returned to baseline and stable Postop Assessment: no apparent nausea or vomiting Anesthetic complications: no   No notable events documented.   Last Vitals:  Vitals:   01/05/24 1445 01/05/24 1504  BP: (!) 140/75 (!) 147/86  Pulse: (!) 46 (!) 45  Resp:  16  Temp:  (!) 36.2 C  SpO2: 100% 100%    Last Pain:  Vitals:   01/05/24 1538  TempSrc:   PainSc: 4                  Prentice Murphy

## 2024-02-07 ENCOUNTER — Encounter: Payer: Self-pay | Admitting: Urology

## 2024-02-07 ENCOUNTER — Other Ambulatory Visit (INDEPENDENT_AMBULATORY_CARE_PROVIDER_SITE_OTHER): Payer: Self-pay | Admitting: Vascular Surgery

## 2024-02-07 ENCOUNTER — Ambulatory Visit: Admitting: Urology

## 2024-02-07 VITALS — BP 161/80 | HR 65 | Ht 70.0 in | Wt 160.0 lb

## 2024-02-07 DIAGNOSIS — T148XXA Other injury of unspecified body region, initial encounter: Secondary | ICD-10-CM

## 2024-02-07 DIAGNOSIS — N35919 Unspecified urethral stricture, male, unspecified site: Secondary | ICD-10-CM

## 2024-02-07 DIAGNOSIS — Z87448 Personal history of other diseases of urinary system: Secondary | ICD-10-CM | POA: Diagnosis not present

## 2024-02-07 DIAGNOSIS — I739 Peripheral vascular disease, unspecified: Secondary | ICD-10-CM

## 2024-02-07 NOTE — Progress Notes (Signed)
 02/07/2024 11:05 AM   [name] ONEIDA Kirks [name] 1955/07/10 [id number]  Referring provider: No referring provider defined for this encounter.  Chief Complaint  Patient presents with   Other   Urologic history:  1.  Recurrent urethral stricture Intraoperative consult 06/2023 Dr. Penne for inability to place Foley catheter; underwent dilation of dense bulbar stricture and council catheter placement Optilume balloon dilation 01/05/2024 for recurrent bulbar stricture  HPI: [name] CUFFE [name] is a 68 y.o. male who presents for a postop follow-up.  No complaints since catheter removal States he is voiding with a good stream; no bothersome LUTS  PMH: Past Medical History:  Diagnosis Date   Aortic atherosclerosis    Arthritis    Cardiomegaly    CKD (chronic kidney disease), stage III (HCC)    Coronary artery disease    Dyspnea    GERD (gastroesophageal reflux disease)    Gout    HFrEF (heart failure with reduced ejection fraction) (HCC)    Hyperlipidemia    Hypertension    Iron deficiency anemia    Ischemic cardiomyopathy    Long term current use of clopidogrel     Long-term use of aspirin  therapy    Microhematuria    Nephrolithiasis    NSTEMI (non-ST elevated myocardial infarction) (HCC) 04/14/2004   a.) LHC/PCI 04/16/2004: 80% pLAD (3.0 x 13 mm Cypher), 95% dLAD (2.5 x 18 mm Cypher)   Parastomal hernia (RLQ)    Peripheral vascular disease    a.) s/p LEFT fem-tib bypass 07/11/2023   Pulmonary nodules    Rectal cancer (HCC)    a.) stage IIA rectal cancer s/p neoadjuvant chemotherapy + XRT + surgical resection (LAR)   T2DM (type 2 diabetes mellitus) (HCC)    Urethral stricture (recurrent)    Wears dentures    full upper    Surgical History: Past Surgical History:  Procedure Laterality Date   BOWEL RESECTION N/A 10/23/2014   Procedure: LOW ANTERIOR BOWEL RESECTION;  Surgeon: Oneil Chang, MD;  Location: [facility];  Service: General;  Laterality: N/A;   COLONOSCOPY N/A  06/17/2014   COLONOSCOPY WITH ESOPHAGOGASTRODUODENOSCOPY (EGD)     CORONARY ANGIOPLASTY WITH STENT PLACEMENT Left 04/16/2004   Procedure: CORONARY ANGIOPLASTY WITH STENT PLACEMENT; Location: [facility]; Surgeon: Margie Lovelace, MD   CYSTOSCOPY WITH RETROGRADE URETHROGRAM N/A 01/05/2024   Procedure: CYSTOSCOPY WITH RETROGRADE URETHROGRAM;  Surgeon: Twylla Glendia BROCKS, MD;  Location: [facility];  Service: Urology;  Laterality: N/A;   CYSTOSCOPY WITH STENT PLACEMENT Bilateral 10/23/2014   Procedure: CYSTOSCOPY WITH STENT PLACEMENT,URETHRAL DILATION, LEFT RETROGRADE PYELOGRAM, URETEROSCOPY;  Surgeon: Rosina Penne, MD;  Location: [facility];  Service: Urology;  Laterality: Bilateral;   CYSTOURETHROSCOPY, W/ URETHRAL STRICTURE DILATION USING DRUG-COATED BALLOON N/A 01/05/2024   Procedure: CYSTOURETHROSCOPY, WITH URETHRAL STRICTURE DILATION USING DRUG-COATED BALLOON;  Surgeon: Twylla Glendia BROCKS, MD;  Location: [facility];  Service: Urology;  Laterality: N/A;   DIVERTING ILEOSTOMY N/A 10/23/2014   Procedure: DIVERTING ILEOSTOMY;  Surgeon: Oneil Chang, MD;  Location: [facility];  Service: General;  Laterality: N/A;   FEMORAL-TIBIAL BYPASS GRAFT Left 07/11/2023   Procedure: CREATION, BYPASS, ARTERIAL, FEMORAL TO TIBIAL, USING GRAFT, INSITU;  Surgeon: Marea Selinda RAMAN, MD;  Location: [facility];  Service: Vascular;  Laterality: Left;   ILEOSTOMY CLOSURE N/A 09/08/2015   Procedure: ILEOSTOMY TAKEDOWN;  Surgeon: Carlin Pastel, MD;  Location: [facility];  Service: General;  Laterality: N/A;   LAPAROTOMY N/A 10/23/2014   Procedure: EXPLORATORY LAPAROTOMY;  Surgeon: Oneil Chang, MD;  Location: [facility];  Service: General;  Laterality: N/A;   LOWER EXTREMITY ANGIOGRAPHY Left 07/06/2023   Procedure: Lower Extremity Angiography;  Surgeon: Marea Selinda RAMAN, MD;  Location: [facility];  Service: Cardiovascular;  Laterality: Left;   PERIPHERAL VASCULAR CATHETERIZATION N/A 05/12/2015   Procedure: Abdominal Aortogram w/Lower Extremity;   Surgeon: Selinda RAMAN Marea, MD;  Location: [facility];  Service: Cardiovascular;  Laterality: N/A;   PERIPHERAL VASCULAR CATHETERIZATION  05/12/2015   Procedure: Lower Extremity Intervention;  Surgeon: Selinda RAMAN Marea, MD;  Location: [facility];  Service: Cardiovascular;;   PERIPHERAL VASCULAR CATHETERIZATION N/A 06/30/2015   Procedure: Abdominal Aortogram w/Lower Extremity;  Surgeon: Selinda RAMAN Marea, MD;  Location: [facility];  Service: Cardiovascular;  Laterality: N/A;   PERIPHERAL VASCULAR CATHETERIZATION  06/30/2015   Procedure: Lower Extremity Intervention;  Surgeon: Selinda RAMAN Marea, MD;  Location: [facility];  Service: Cardiovascular;;   PORT-A-CATH REMOVAL Right 09/08/2015   Procedure: REMOVAL PORT-A-CATH;  Surgeon: Carlin Pastel, MD;  Location: [facility];  Service: General;  Laterality: Right;   PORTACATH PLACEMENT  07/01/2014   Dr. Eluterio   TRANSMETATARSAL AMPUTATION Left 07/15/2023   Procedure: AMPUTATION, FOOT, TRANSMETATARSAL;  Surgeon: Ashley Soulier, DPM;  Location: [facility];  Service: Orthopedics/Podiatry;  Laterality: Left;    Home Medications:  Allergies as of 02/07/2024       Reactions   Entresto  [sacubitril -valsartan ] Cough   Lisinopril  Cough        Medication List        Accurate as of February 07, 2024 11:05 AM. If you have any questions, ask your nurse or doctor.          acetaminophen  500 MG tablet Commonly known as: TYLENOL  Take 500-1,000 mg by mouth every 6 (six) hours as needed (pain.).   aspirin  EC 81 MG tablet Take 1 tablet (81 mg total) by mouth daily. Swallow whole. What changed: when to take this   carvedilol  3.125 MG tablet Commonly known as: COREG  Take 3.125 mg by mouth 2 (two) times daily with a meal.   cefUROXime  500 MG tablet Commonly known as: CEFTIN  Take by mouth daily.   clopidogrel  75 MG tablet Commonly known as: PLAVIX  Take 1 tablet (75 mg total) by mouth daily.   colchicine  0.6 MG tablet Take 0.6 mg  by mouth in the morning and at bedtime.   cyanocobalamin  1000 MCG tablet Commonly known as: VITAMIN B12 Take 1,000 mcg by mouth in the morning.   Dulcolax 5 MG EC tablet Generic drug: bisacodyl  Take 1 tablet (5 mg total) by mouth daily as needed for severe constipation.   Iron 325 (65 Fe) MG Tabs Take 1 tablet by mouth every other day.   polyethylene glycol 17 g packet Commonly known as: MIRALAX  / GLYCOLAX  Take 17 g by mouth daily. What changed:  when to take this reasons to take this   sulfamethoxazole -trimethoprim  800-160 MG tablet Commonly known as: BACTRIM  DS Take 1 tablet by mouth 2 (two) times daily.   vitamin C  1000 MG tablet Take 1,000 mg by mouth in the morning. Reported on 09/26/2015        Allergies:  Allergies  Allergen Reactions   Entresto  [Sacubitril -Valsartan ] Cough   Lisinopril  Cough    Family History: Family History  Problem Relation Age of Onset   Hypertension Brother    Hypertension Father    Diabetes Father    Diabetes Brother    Hypertension Brother    Heart disease Paternal Grandfather  Social History:  reports that he quit smoking about 39 years ago. His smoking use included cigarettes. He has never used smokeless tobacco. He reports that he does not drink alcohol and does not use drugs.   Physical Exam: BP (!) 161/80   Pulse 65   Ht 5' 10 (1.778 m)   Wt 160 lb (72.6 kg)   BMI 22.96 kg/m   Constitutional:  Alert, No acute distress. HEENT: [place] AT Respiratory: Normal respiratory effort, no increased work of breathing.  Assessment & Plan:    1.  Recurrent urethral stricture Doing well status post Optilume balloon dilation Follow-up visit 4 months Instructed to call earlier for onset recurrent obstructive voiding symptoms   Glendia JAYSON Barba, MD  [facility] [address], [address] [place], [place] [address] [phone]

## 2024-02-09 ENCOUNTER — Ambulatory Visit (INDEPENDENT_AMBULATORY_CARE_PROVIDER_SITE_OTHER): Admitting: Nurse Practitioner

## 2024-02-09 ENCOUNTER — Other Ambulatory Visit (INDEPENDENT_AMBULATORY_CARE_PROVIDER_SITE_OTHER)

## 2024-02-09 ENCOUNTER — Encounter (INDEPENDENT_AMBULATORY_CARE_PROVIDER_SITE_OTHER): Payer: Self-pay

## 2024-02-09 ENCOUNTER — Encounter (INDEPENDENT_AMBULATORY_CARE_PROVIDER_SITE_OTHER): Payer: Self-pay | Admitting: Nurse Practitioner

## 2024-02-09 VITALS — BP 147/77 | HR 52 | Resp 18 | Ht 68.0 in | Wt 172.0 lb

## 2024-02-09 DIAGNOSIS — Z9889 Other specified postprocedural states: Secondary | ICD-10-CM | POA: Diagnosis not present

## 2024-02-09 DIAGNOSIS — I10 Essential (primary) hypertension: Secondary | ICD-10-CM | POA: Diagnosis not present

## 2024-02-09 DIAGNOSIS — I739 Peripheral vascular disease, unspecified: Secondary | ICD-10-CM

## 2024-02-09 DIAGNOSIS — T148XXA Other injury of unspecified body region, initial encounter: Secondary | ICD-10-CM

## 2024-02-09 DIAGNOSIS — E11621 Type 2 diabetes mellitus with foot ulcer: Secondary | ICD-10-CM

## 2024-02-09 DIAGNOSIS — L97509 Non-pressure chronic ulcer of other part of unspecified foot with unspecified severity: Secondary | ICD-10-CM

## 2024-02-12 ENCOUNTER — Encounter (INDEPENDENT_AMBULATORY_CARE_PROVIDER_SITE_OTHER): Payer: Self-pay | Admitting: Nurse Practitioner

## 2024-02-12 NOTE — Progress Notes (Signed)
 Subjective:    Patient ID: [name] [name] Kirks [name], male    DOB: Sep 26, 1955, 68 y.o.   MRN: [id number] Chief Complaint  Patient presents with   Follow-up    3 months + ABI + L Art Duplex    [name] MOWERS [name] is a 68 y.o. male.  He returns today following a left femoral to tibial bypass and left profunda femoris endarterectomy on 07/11/2023.  He previously had significant swelling from reperfusion of the left lower extremity.  Today all of his incisions are clean dry and intact and healing well.  There is no open wounds or ulcerations.  The swelling has resolved itself.  He denies any claudication symptoms.  He denies any significant rest pain.  His TMA has healed at this time.  .  Today it was noted that he has a seroma near his proximal anastomosis.  He also has a hematoma still at his distal anastomosis but it is much smaller at 0.68 x 0.46 cm.  In addition his left bypass graft shows an occlusion in the mid graft area.       Review of Systems  Neurological:  Positive for weakness.  All other systems reviewed and are negative.      Objective:   Physical Exam Vitals reviewed.  HENT:     Head: Normocephalic.  Cardiovascular:     Rate and Rhythm: Normal rate.     Pulses:          Dorsalis pedis pulses are detected w/ Doppler on the left side.       Posterior tibial pulses are detected w/ Doppler on the left side.  Pulmonary:     Effort: Pulmonary effort is normal.  Skin:    General: Skin is warm and dry.  Neurological:     Mental Status: He is alert and oriented to person, place, and time.  Psychiatric:        Mood and Affect: Mood normal.        Behavior: Behavior normal.        Thought Content: Thought content normal.        Judgment: Judgment normal.     BP (!) 147/77   Pulse (!) 52   Resp 18   Ht 5' 8 (1.727 m)   Wt 172 lb (78 kg)   BMI 26.15 kg/m   Past Medical History:  Diagnosis Date   Aortic atherosclerosis    Arthritis    Cardiomegaly    CKD  (chronic kidney disease), stage III (HCC)    Coronary artery disease    Dyspnea    GERD (gastroesophageal reflux disease)    Gout    HFrEF (heart failure with reduced ejection fraction) (HCC)    Hyperlipidemia    Hypertension    Iron deficiency anemia    Ischemic cardiomyopathy    Long term current use of clopidogrel     Long-term use of aspirin  therapy    Microhematuria    Nephrolithiasis    NSTEMI (non-ST elevated myocardial infarction) (HCC) 04/14/2004   a.) LHC/PCI 04/16/2004: 80% pLAD (3.0 x 13 mm Cypher), 95% dLAD (2.5 x 18 mm Cypher)   Parastomal hernia (RLQ)    Peripheral vascular disease    a.) s/p LEFT fem-tib bypass 07/11/2023   Pulmonary nodules    Rectal cancer (HCC)    a.) stage IIA rectal cancer s/p neoadjuvant chemotherapy + XRT + surgical resection (LAR)   T2DM (type 2 diabetes mellitus) (HCC)    Urethral stricture (  recurrent)    Wears dentures    full upper    Social History   Socioeconomic History   Marital status: Married    Spouse name: Zebulun, Deman (Spouse)   Number of children: Not on file   Years of education: Not on file   Highest education level: Not on file  Occupational History   Not on file  Tobacco Use   Smoking status: Former    Current packs/day: 0.00    Types: Cigarettes    Quit date: 11/19/1984    Years since quitting: 39.2   Smokeless tobacco: Never   Tobacco comments:    smoked for brief period in his teens  Vaping Use   Vaping status: Never Used  Substance and Sexual Activity   Alcohol use: No    Alcohol/week: 0.0 standard drinks of alcohol   Drug use: No   Sexual activity: Not Currently  Other Topics Concern   Not on file  Social History Narrative   Not on file   Social Drivers of Health   Financial Resource Strain: Not on file  Food Insecurity: No Food Insecurity (07/04/2023)   Hunger Vital Sign    Worried About Running Out of Food in the Last Year: Never true    Ran Out of Food in the Last Year: Never true   Transportation Needs: No Transportation Needs (07/04/2023)   PRAPARE - Administrator, Civil Service (Medical): No    Lack of Transportation (Non-Medical): No  Physical Activity: Not on file  Stress: Not on file  Social Connections: Moderately Integrated (07/04/2023)   Social Connection and Isolation Panel    Frequency of Communication with Friends and Family: Three times a week    Frequency of Social Gatherings with Friends and Family: More than three times a week    Attends Religious Services: More than 4 times per year    Active Member of Clubs or Organizations: No    Attends Banker Meetings: Never    Marital Status: Married  Catering Manager Violence: Not At Risk (07/04/2023)   Humiliation, Afraid, Rape, and Kick questionnaire    Fear of Current or Ex-Partner: No    Emotionally Abused: No    Physically Abused: No    Sexually Abused: No    Past Surgical History:  Procedure Laterality Date   BOWEL RESECTION N/A 10/23/2014   Procedure: LOW ANTERIOR BOWEL RESECTION;  Surgeon: Oneil Chang, MD;  Location: [facility];  Service: General;  Laterality: N/A;   COLONOSCOPY N/A 06/17/2014   COLONOSCOPY WITH ESOPHAGOGASTRODUODENOSCOPY (EGD)     CORONARY ANGIOPLASTY WITH STENT PLACEMENT Left 04/16/2004   Procedure: CORONARY ANGIOPLASTY WITH STENT PLACEMENT; Location: [facility]; Surgeon: Margie Lovelace, MD   CYSTOSCOPY WITH RETROGRADE URETHROGRAM N/A 01/05/2024   Procedure: CYSTOSCOPY WITH RETROGRADE URETHROGRAM;  Surgeon: Twylla Glendia BROCKS, MD;  Location: [facility];  Service: Urology;  Laterality: N/A;   CYSTOSCOPY WITH STENT PLACEMENT Bilateral 10/23/2014   Procedure: CYSTOSCOPY WITH STENT PLACEMENT,URETHRAL DILATION, LEFT RETROGRADE PYELOGRAM, URETEROSCOPY;  Surgeon: Rosina Riis, MD;  Location: [facility];  Service: Urology;  Laterality: Bilateral;   CYSTOURETHROSCOPY, W/ URETHRAL STRICTURE DILATION USING DRUG-COATED BALLOON N/A 01/05/2024   Procedure: CYSTOURETHROSCOPY, WITH  URETHRAL STRICTURE DILATION USING DRUG-COATED BALLOON;  Surgeon: Twylla Glendia BROCKS, MD;  Location: [facility];  Service: Urology;  Laterality: N/A;   DIVERTING ILEOSTOMY N/A 10/23/2014   Procedure: DIVERTING ILEOSTOMY;  Surgeon: Oneil Chang, MD;  Location: [facility];  Service: General;  Laterality: N/A;   FEMORAL-TIBIAL  BYPASS GRAFT Left 07/11/2023   Procedure: CREATION, BYPASS, ARTERIAL, FEMORAL TO TIBIAL, USING GRAFT, INSITU;  Surgeon: Marea Selinda RAMAN, MD;  Location: [facility];  Service: Vascular;  Laterality: Left;   ILEOSTOMY CLOSURE N/A 09/08/2015   Procedure: ILEOSTOMY TAKEDOWN;  Surgeon: Carlin Pastel, MD;  Location: [facility];  Service: General;  Laterality: N/A;   LAPAROTOMY N/A 10/23/2014   Procedure: EXPLORATORY LAPAROTOMY;  Surgeon: Oneil Chang, MD;  Location: [facility];  Service: General;  Laterality: N/A;   LOWER EXTREMITY ANGIOGRAPHY Left 07/06/2023   Procedure: Lower Extremity Angiography;  Surgeon: Marea Selinda RAMAN, MD;  Location: [facility];  Service: Cardiovascular;  Laterality: Left;   PERIPHERAL VASCULAR CATHETERIZATION N/A 05/12/2015   Procedure: Abdominal Aortogram w/Lower Extremity;  Surgeon: Selinda RAMAN Marea, MD;  Location: [facility];  Service: Cardiovascular;  Laterality: N/A;   PERIPHERAL VASCULAR CATHETERIZATION  05/12/2015   Procedure: Lower Extremity Intervention;  Surgeon: Selinda RAMAN Marea, MD;  Location: [facility];  Service: Cardiovascular;;   PERIPHERAL VASCULAR CATHETERIZATION N/A 06/30/2015   Procedure: Abdominal Aortogram w/Lower Extremity;  Surgeon: Selinda RAMAN Marea, MD;  Location: [facility];  Service: Cardiovascular;  Laterality: N/A;   PERIPHERAL VASCULAR CATHETERIZATION  06/30/2015   Procedure: Lower Extremity Intervention;  Surgeon: Selinda RAMAN Marea, MD;  Location: [facility];  Service: Cardiovascular;;   PORT-A-CATH REMOVAL Right 09/08/2015   Procedure: REMOVAL PORT-A-CATH;  Surgeon: Carlin Pastel, MD;  Location: [facility];  Service:  General;  Laterality: Right;   PORTACATH PLACEMENT  07/01/2014   Dr. Chang   TRANSMETATARSAL AMPUTATION Left 07/15/2023   Procedure: AMPUTATION, FOOT, TRANSMETATARSAL;  Surgeon: Ashley Soulier, DPM;  Location: [facility];  Service: Orthopedics/Podiatry;  Laterality: Left;    Family History  Problem Relation Age of Onset   Hypertension Brother    Hypertension Father    Diabetes Father    Diabetes Brother    Hypertension Brother    Heart disease Paternal Grandfather     Allergies  Allergen Reactions   Entresto  [Sacubitril -Valsartan ] Cough   Lisinopril  Cough       Latest Ref Rng & Units 07/22/2023    5:26 AM 07/21/2023    4:56 AM 07/20/2023    1:54 AM  CBC  WBC 4.0 - 10.5 K/uL 9.4  9.7  9.7   Hemoglobin 13.0 - 17.0 g/dL 7.1  7.6  7.9   Hematocrit 39.0 - 52.0 % 22.5  24.0  24.3   Platelets 150 - 400 K/uL 248  301  277       CMP     Component Value Date/Time   NA 142 07/22/2023 0526   NA 132 (L) 08/12/2014 0858   K 3.4 (L) 07/22/2023 0526   K 4.0 08/12/2014 0858   CL 110 07/22/2023 0526   CL 100 (L) 08/12/2014 0858   CO2 22 07/22/2023 0526   CO2 24 08/12/2014 0858   GLUCOSE 71 07/22/2023 0526   GLUCOSE 118 (H) 08/12/2014 0858   BUN 27 (H) 07/22/2023 0526   BUN 30 (H) 08/12/2014 0858   CREATININE 1.20 10/11/2023 1310   CREATININE 1.52 (H) 08/12/2014 0858   CALCIUM  8.2 (L) 07/22/2023 0526   CALCIUM  8.9 08/12/2014 0858   PROT 7.3 07/04/2023 1318   PROT 7.1 08/12/2014 0858   ALBUMIN  3.7 07/04/2023 1318   ALBUMIN  3.7 08/12/2014 0858   AST 14 (L) 07/04/2023 1318   AST 16 08/12/2014 0858   ALT 15 07/04/2023 1318   ALT 15 (  L) 08/12/2014 0858   ALKPHOS 97 07/04/2023 1318   ALKPHOS 69 08/12/2014 0858   BILITOT 0.8 07/04/2023 1318   BILITOT 0.6 08/12/2014 0858   EGFR 57.0 09/02/2023 0959   GFRNONAA >60 07/22/2023 0526   GFRNONAA 49 (L) 08/12/2014 0858     No results found.     Assessment & Plan:   1. Peripheral arterial disease with history of revascularization  (HCC) (Primary)  Recommend:  The patient has evidence of atherosclerosis of the lower extremities with no significant claudication.  The patient does not voice lifestyle limiting changes at this point in time.  Noninvasive studies do not suggest clinically significant change.  No invasive studies, angiography or surgery at this time The patient should continue walking and begin a more formal exercise program.  The patient should continue antiplatelet therapy and aggressive treatment of the lipid abnormalities  No changes in the patient's medications at this time  Continued surveillance is indicated as atherosclerosis is likely to progress with time.    The patient will continue follow up with noninvasive studies as ordered.   2. Primary hypertension Continue antihypertensive medications as already ordered, these medications have been reviewed and there are no changes at this time.  3. Type 2 diabetes mellitus with foot ulcer, without long-term current use of insulin  (HCC) Continue hypoglycemic medications as already ordered, these medications have been reviewed and there are no changes at this time.  Hgb A1C to be monitored as already arranged by primary service    Current Outpatient Medications on File Prior to Visit  Medication Sig Dispense Refill   acetaminophen  (TYLENOL ) 500 MG tablet Take 500-1,000 mg by mouth every 6 (six) hours as needed (pain.).     Ascorbic Acid  (VITAMIN C ) 1000 MG tablet Take 1,000 mg by mouth in the morning. Reported on 09/26/2015     aspirin  EC 81 MG tablet Take 1 tablet (81 mg total) by mouth daily. Swallow whole. (Patient taking differently: Take 81 mg by mouth 3 (three) times a week. Swallow whole.)     bisacodyl  (DULCOLAX) 5 MG EC tablet Take 1 tablet (5 mg total) by mouth daily as needed for severe constipation.     carvedilol  (COREG ) 3.125 MG tablet Take 3.125 mg by mouth 2 (two) times daily with a meal.     clopidogrel  (PLAVIX ) 75 MG tablet Take 1  tablet (75 mg total) by mouth daily.     colchicine  0.6 MG tablet Take 0.6 mg by mouth in the morning and at bedtime.     cyanocobalamin  (VITAMIN B12) 1000 MCG tablet Take 1,000 mcg by mouth in the morning.     Ferrous Sulfate  (IRON) 325 (65 Fe) MG TABS Take 1 tablet by mouth every other day.     polyethylene glycol (MIRALAX  / GLYCOLAX ) 17 g packet Take 17 g by mouth daily. (Patient taking differently: Take 17 g by mouth daily as needed (constipation.).)     cefUROXime  (CEFTIN ) 500 MG tablet Take by mouth daily. (Patient not taking: Reported on 02/09/2024)     sulfamethoxazole -trimethoprim  (BACTRIM  DS) 800-160 MG tablet Take 1 tablet by mouth 2 (two) times daily. (Patient not taking: Reported on 02/09/2024)     Current Facility-Administered Medications on File Prior to Visit  Medication Dose Route Frequency Provider Last Rate Last Admin   heparin  lock flush 100 unit/mL  500 Units Intravenous Once Corcoran, Melissa C, MD       sodium chloride  0.9 % injection 10 mL  10 mL Intravenous PRN  Michaela Lamar MATSU, MD   10 mL at 08/26/14 1300   sodium chloride  flush (NS) 0.9 % injection 10 mL  10 mL Intravenous PRN Corcoran, Melissa C, MD        There are no Patient Instructions on file for this visit. No follow-ups on file.   Skilynn Durney E Emmilia Sowder, NP

## 2024-06-08 ENCOUNTER — Ambulatory Visit: Admitting: Urology

## 2024-06-13 ENCOUNTER — Ambulatory Visit: Admitting: Urology

## 2024-08-09 ENCOUNTER — Encounter (INDEPENDENT_AMBULATORY_CARE_PROVIDER_SITE_OTHER)

## 2024-08-09 ENCOUNTER — Ambulatory Visit (INDEPENDENT_AMBULATORY_CARE_PROVIDER_SITE_OTHER): Admitting: Nurse Practitioner
